# Patient Record
Sex: Male | Born: 1955
Health system: Southern US, Community
[De-identification: ages and names within clinical notes are randomized; demographics above are authoritative.]

## PROBLEM LIST (undated history)

## (undated) DIAGNOSIS — T07XXXA Unspecified multiple injuries, initial encounter: Secondary | ICD-10-CM

## (undated) DIAGNOSIS — F329 Major depressive disorder, single episode, unspecified: Secondary | ICD-10-CM

## (undated) DIAGNOSIS — F1010 Alcohol abuse, uncomplicated: Secondary | ICD-10-CM

## (undated) DIAGNOSIS — G8929 Other chronic pain: Secondary | ICD-10-CM

## (undated) DIAGNOSIS — F32A Depression, unspecified: Secondary | ICD-10-CM

## (undated) DIAGNOSIS — M1990 Unspecified osteoarthritis, unspecified site: Secondary | ICD-10-CM

## (undated) DIAGNOSIS — K219 Gastro-esophageal reflux disease without esophagitis: Secondary | ICD-10-CM

## (undated) DIAGNOSIS — H269 Unspecified cataract: Secondary | ICD-10-CM

## (undated) DIAGNOSIS — M549 Dorsalgia, unspecified: Secondary | ICD-10-CM

## (undated) HISTORY — PX: FINGER FRACTURE SURGERY: SHX638

## (undated) HISTORY — PX: ARTHROSCOPIC REPAIR ACL: SUR80

## (undated) HISTORY — PX: TONSILLECTOMY: SUR1361

## (undated) HISTORY — PX: ORIF ANKLE FRACTURE: SUR919

## (undated) HISTORY — PX: BACK SURGERY: SHX140

## (undated) HISTORY — PX: JOINT REPLACEMENT: SHX530

## (undated) HISTORY — PX: MANDIBLE FRACTURE SURGERY: SHX706

## (undated) HISTORY — DX: Unspecified multiple injuries, initial encounter: T07.XXXA

## (undated) HISTORY — PX: ORBITAL FRACTURE SURGERY: SHX725

## (undated) HISTORY — PX: FRACTURE SURGERY: SHX138

---

## 1997-12-05 ENCOUNTER — Inpatient Hospital Stay (HOSPITAL_COMMUNITY): Admission: EM | Admit: 1997-12-05 | Discharge: 1997-12-07 | Payer: Self-pay | Admitting: Emergency Medicine

## 1998-03-30 ENCOUNTER — Emergency Department (HOSPITAL_COMMUNITY): Admission: EM | Admit: 1998-03-30 | Discharge: 1998-03-30 | Payer: Self-pay | Admitting: Emergency Medicine

## 1998-09-27 ENCOUNTER — Encounter: Payer: Self-pay | Admitting: Emergency Medicine

## 1998-09-27 ENCOUNTER — Emergency Department (HOSPITAL_COMMUNITY): Admission: EM | Admit: 1998-09-27 | Discharge: 1998-09-27 | Payer: Self-pay | Admitting: Emergency Medicine

## 1999-01-08 ENCOUNTER — Emergency Department (HOSPITAL_COMMUNITY): Admission: EM | Admit: 1999-01-08 | Discharge: 1999-01-08 | Payer: Self-pay | Admitting: Emergency Medicine

## 1999-01-12 ENCOUNTER — Emergency Department (HOSPITAL_COMMUNITY): Admission: EM | Admit: 1999-01-12 | Discharge: 1999-01-12 | Payer: Self-pay | Admitting: Emergency Medicine

## 1999-01-18 ENCOUNTER — Emergency Department (HOSPITAL_COMMUNITY): Admission: EM | Admit: 1999-01-18 | Discharge: 1999-01-18 | Payer: Self-pay | Admitting: Emergency Medicine

## 1999-09-05 ENCOUNTER — Emergency Department (HOSPITAL_COMMUNITY): Admission: EM | Admit: 1999-09-05 | Discharge: 1999-09-05 | Payer: Self-pay | Admitting: Emergency Medicine

## 1999-09-05 ENCOUNTER — Encounter: Payer: Self-pay | Admitting: Emergency Medicine

## 2001-11-30 ENCOUNTER — Emergency Department (HOSPITAL_COMMUNITY): Admission: EM | Admit: 2001-11-30 | Discharge: 2001-11-30 | Payer: Self-pay | Admitting: Emergency Medicine

## 2002-12-02 ENCOUNTER — Other Ambulatory Visit (HOSPITAL_COMMUNITY): Admission: RE | Admit: 2002-12-02 | Discharge: 2002-12-12 | Payer: Self-pay | Admitting: Psychiatry

## 2003-06-10 ENCOUNTER — Emergency Department (HOSPITAL_COMMUNITY): Admission: EM | Admit: 2003-06-10 | Discharge: 2003-06-10 | Payer: Self-pay | Admitting: Emergency Medicine

## 2003-10-14 ENCOUNTER — Emergency Department (HOSPITAL_COMMUNITY): Admission: EM | Admit: 2003-10-14 | Discharge: 2003-10-14 | Payer: Self-pay | Admitting: Family Medicine

## 2007-05-15 ENCOUNTER — Emergency Department (HOSPITAL_COMMUNITY): Admission: EM | Admit: 2007-05-15 | Discharge: 2007-05-15 | Payer: Self-pay | Admitting: Emergency Medicine

## 2009-05-11 ENCOUNTER — Emergency Department (HOSPITAL_COMMUNITY): Admission: EM | Admit: 2009-05-11 | Discharge: 2009-05-11 | Payer: Self-pay | Admitting: Emergency Medicine

## 2011-04-11 LAB — HEPATIC FUNCTION PANEL
ALT: 13
AST: 22
Albumin: 4.1
Total Protein: 7.1

## 2011-04-11 LAB — POCT CARDIAC MARKERS
Myoglobin, poc: 73.8
Operator id: 288331

## 2011-04-11 LAB — CBC
MCV: 101 — ABNORMAL HIGH
Platelets: 191
WBC: 6

## 2011-04-11 LAB — URINALYSIS, ROUTINE W REFLEX MICROSCOPIC
Hgb urine dipstick: NEGATIVE
Protein, ur: NEGATIVE
Specific Gravity, Urine: 1.016
Urobilinogen, UA: 0.2
pH: 5.5

## 2011-04-11 LAB — I-STAT 8, (EC8 V) (CONVERTED LAB)
BUN: 15
Bicarbonate: 22.8
Glucose, Bld: 103 — ABNORMAL HIGH
Operator id: 288331
pCO2, Ven: 35.4 — ABNORMAL LOW
pH, Ven: 7.417 — ABNORMAL HIGH

## 2011-04-11 LAB — DIFFERENTIAL
Basophils Relative: 0
Eosinophils Absolute: 0 — ABNORMAL LOW
Lymphs Abs: 1
Neutrophils Relative %: 74

## 2011-04-11 LAB — POCT I-STAT CREATININE: Creatinine, Ser: 1.1

## 2011-12-19 ENCOUNTER — Encounter (HOSPITAL_COMMUNITY): Payer: Self-pay | Admitting: *Deleted

## 2011-12-19 ENCOUNTER — Emergency Department (HOSPITAL_COMMUNITY)
Admission: EM | Admit: 2011-12-19 | Discharge: 2011-12-19 | Disposition: A | Discharge status: Home / Self Care | Payer: Self-pay | Attending: Emergency Medicine | Admitting: Emergency Medicine

## 2011-12-19 DIAGNOSIS — R1110 Vomiting, unspecified: Secondary | ICD-10-CM

## 2011-12-19 DIAGNOSIS — R112 Nausea with vomiting, unspecified: Secondary | ICD-10-CM | POA: Insufficient documentation

## 2011-12-19 DIAGNOSIS — J3489 Other specified disorders of nose and nasal sinuses: Secondary | ICD-10-CM | POA: Insufficient documentation

## 2011-12-19 DIAGNOSIS — T148 Other injury of unspecified body region: Secondary | ICD-10-CM | POA: Insufficient documentation

## 2011-12-19 DIAGNOSIS — F17200 Nicotine dependence, unspecified, uncomplicated: Secondary | ICD-10-CM | POA: Insufficient documentation

## 2011-12-19 DIAGNOSIS — W57XXXA Bitten or stung by nonvenomous insect and other nonvenomous arthropods, initial encounter: Secondary | ICD-10-CM | POA: Insufficient documentation

## 2011-12-19 LAB — COMPREHENSIVE METABOLIC PANEL
Alkaline Phosphatase: 90 U/L (ref 39–117)
BUN: 13 mg/dL (ref 6–23)
CO2: 27 mEq/L (ref 19–32)
Calcium: 9.4 mg/dL (ref 8.4–10.5)
GFR calc Af Amer: 88 mL/min — ABNORMAL LOW (ref >90)
GFR calc non Af Amer: 76 mL/min — ABNORMAL LOW (ref >90)
Glucose, Bld: 94 mg/dL (ref 70–99)
Potassium: 4 mEq/L (ref 3.5–5.1)
Total Protein: 7.3 g/dL (ref 6.0–8.3)

## 2011-12-19 LAB — COMPREHENSIVE METABOLIC PANEL WITH GFR
ALT: 11 U/L (ref 0–53)
AST: 23 U/L (ref 0–37)
Albumin: 4 g/dL (ref 3.5–5.2)
Chloride: 101 meq/L (ref 96–112)
Creatinine, Ser: 1.06 mg/dL (ref 0.50–1.35)
Sodium: 139 meq/L (ref 135–145)
Total Bilirubin: 0.3 mg/dL (ref 0.3–1.2)

## 2011-12-19 LAB — CBC
HCT: 42.6 % (ref 39.0–52.0)
Hemoglobin: 14.9 g/dL (ref 13.0–17.0)
MCH: 33.1 pg (ref 26.0–34.0)
MCHC: 35 g/dL (ref 30.0–36.0)
MCV: 94.7 fL (ref 78.0–100.0)
Platelets: 163 K/uL (ref 150–400)
RBC: 4.5 MIL/uL (ref 4.22–5.81)
RDW: 12.7 % (ref 11.5–15.5)
WBC: 5.7 K/uL (ref 4.0–10.5)

## 2011-12-19 LAB — DIFFERENTIAL
Basophils Absolute: 0 K/uL (ref 0.0–0.1)
Basophils Relative: 1 % (ref 0–1)
Eosinophils Absolute: 0.4 10*3/uL (ref 0.0–0.7)
Eosinophils Relative: 7 % — ABNORMAL HIGH (ref 0–5)
Lymphocytes Relative: 29 % (ref 12–46)
Lymphs Abs: 1.7 10*3/uL (ref 0.7–4.0)
Monocytes Absolute: 0.9 10*3/uL (ref 0.1–1.0)
Monocytes Relative: 16 % — ABNORMAL HIGH (ref 3–12)
Neutro Abs: 2.8 K/uL (ref 1.7–7.7)
Neutrophils Relative %: 49 % (ref 43–77)

## 2011-12-19 LAB — LIPASE, BLOOD: Lipase: 41 U/L (ref 11–59)

## 2011-12-19 MED ORDER — ONDANSETRON HCL 4 MG/2ML IJ SOLN: 4.0000 mg | Freq: Once | INTRAMUSCULAR | Status: DC

## 2011-12-19 MED ORDER — DIPHENHYDRAMINE HCL 25 MG PO CAPS
50.0000 mg | ORAL_CAPSULE | Freq: Once | ORAL | Status: AC
  Administered 2011-12-19: 50 mg via ORAL
  Filled 2011-12-19: qty 2

## 2011-12-19 MED ORDER — SODIUM CHLORIDE 0.9 % IV BOLUS (SEPSIS): 500.0000 mL | Freq: Once | INTRAVENOUS | Status: DC

## 2011-12-19 MED ORDER — DIPHENHYDRAMINE HCL 25 MG PO CAPS: 25.0000 mg | ORAL_CAPSULE | Freq: Four times a day (QID) | ORAL | Status: DC | PRN

## 2011-12-19 MED ORDER — ACETAMINOPHEN 325 MG PO TABS
975.0000 mg | ORAL_TABLET | Freq: Once | ORAL | Status: AC
  Administered 2011-12-19: 975 mg via ORAL
  Filled 2011-12-19: qty 3

## 2011-12-19 MED ORDER — SODIUM CHLORIDE 0.9 % IV BOLUS (SEPSIS)
1000.0000 mL | Freq: Once | INTRAVENOUS | Status: AC
  Administered 2011-12-19: 1000 mL via INTRAVENOUS

## 2011-12-19 NOTE — Discharge Instructions (Signed)
Take Tylenol and Benadryl for your symptoms.  Follow up with a Primary Care Physician at [facility].    Follow up with your primary care doctor about your hospital visit. Continue to hydrate orally. Read the instructions below for reasons to return to the ER.   The 'BRAT' diet is suggested, then progress to diet as tolerated as symptoms abate. Call if bloody stools, persistent diarrhea, vomiting, fever or abdominal pain. Bananas.  Rice.  Applesauce.  Toast (and other simple starches such as crackers, potatoes, noodles).   SEEK IMMEDIATE MEDICAL ATTENTION IF:  You begin having localized abdominal pain that does not go away or becomes severe (The right side could  possibly be appendicitis. In an adult, the left lower portion of the abdomen could be colitis or diverticulitis)   A temperature above 101 develops  Repeated vomiting occurs (multiple uncontrollable episodes) or you are unable to keep fluids down  Blood is being passed in stools or vomit (bright red or black tarry stools).   Return also if you develop chest pain, difficulty breathing, dizziness or fainting, or become confused, poorly responsive, or inconsolable (young children).   RESOURCE GUIDE  Dental Problems  Patients with Medicaid: [facility] [phone]                                           [phone] [address] [employer] Phone:  [phone]                                                  Phone:  [phone]  If unable to pay or uninsured, contact:  [facility] or [facility]. to become qualified for the adult dental clinic.  Chronic Pain Problems Contact [facility]  [phone] Patients need to be referred by their primary care doctor.  Insufficient Money for Medicine Contact United Way:  call "211" or [facility] [phone].  No Primary Care Doctor Call [facility] ([facility]) [phone] Other agencies that provide inexpensive medical care    [facility]  [phone]    [facility]  [phone]    [facility]  [phone]    [facility]  [phone]    Planned Parenthood  [phone]    [facility]  [phone]  Psychological Services [facility]  [phone] [facility] Services  [phone] [facility]   [phone] (emergency services 3144035102)  Substance Abuse Resources [facility]  9012848186 [facility] 7276920221 [place] [phone] Floydene Flock [phone] Residential & Outpatient Substance Abuse Program  561-345-4070  Abuse/Neglect [facility] Child Abuse Hotline 4702843192 [facility] Child Abuse Hotline [phone] (After Hours)  Emergency Shelter [facility] [phone]  Maternity Homes [facility] [phone] [facility] 8024787016  MRSA Hotline #:   628-736-1078    [facility]  [facility] [address] [place]                       [address]      [address]  [place]                                                [place]                            [place] Phone:  [phone]                                   Phone:  [phone]                 Phone:  [phone]  [facility] Phone:  [phone]  [place] Child Abuse Hotline (336) 342-1394 [phone]) [phone] (After Hours)    

## 2011-12-19 NOTE — ED Notes (Signed)
Discharged via teach back 

## 2011-12-19 NOTE — ED Notes (Signed)
Heather PA informed of HR

## 2011-12-19 NOTE — ED Notes (Signed)
Pt tolerating food and fluids 

## 2011-12-19 NOTE — ED Provider Notes (Signed)
History     CSN: [id number]  Arrival date & time 12/19/11  1519   First MD Initiated Contact with Patient 12/19/11 1652      Chief Complaint  Patient presents with  . Emesis    (Consider location/radiation/quality/duration/timing/severity/associated sxs/prior treatment) HPI Reports has vomited 2-4 times per day onset 2 days ago. Also complains of itching on arms and back as result of blood bites. Also complains of mild diffuse headache onset 2 days ago. No bowel pain no chest pain no fever. No nausea at present he is presently hungry. Associated symptoms include mild generalized weakness, slight cough and nasal congestion. Nothing makes symptoms better or worse. No treatment prior to coming here. History reviewed. No pertinent past medical history. Past medical history negative History reviewed. No pertinent past surgical history.  No family history on file.  History  Substance Use Topics  . Smoking status: Current Everyday Smoker  . Smokeless tobacco: Not on file  . Alcohol Use: Yes     social history positive smoker drinks 1 sixpack of beer per week  Review of Systems  Gastrointestinal: Positive for nausea and vomiting.  Skin: Positive for rash.  Neurological: Positive for weakness and headaches.    Allergies  Review of patient's allergies indicates no known allergies.  Home Medications  No current outpatient prescriptions on file.  BP 100/50  Pulse 80  Temp 98.5 F (36.9 C)  Resp 15  SpO2 97%  Physical Exam  Nursing note and vitals reviewed. Constitutional: He appears well-developed and well-nourished. No distress.  HENT:  Head: Normocephalic and atraumatic.       Nasal congestion  Eyes: Conjunctivae are normal. Pupils are equal, round, and reactive to light.  Neck: Neck supple. No tracheal deviation present. No thyromegaly present.  Cardiovascular: Normal rate and regular rhythm.   No murmur heard. Pulmonary/Chest: Effort normal and breath sounds  normal.  Abdominal: Soft. Bowel sounds are normal. He exhibits no distension. There is no tenderness.  Musculoskeletal: Normal range of motion. He exhibits no edema and no tenderness.  Neurological: He is alert. He displays normal reflexes. Coordination normal.  Skin: Skin is warm and dry. Rash noted.       Multiple pinpoint pinkish papules on bilateral shoulders and upper arms a few on his chest consistent with bug bites  Psychiatric: He has a normal mood and affect.    ED Course  Procedures (including critical care time)   Labs Reviewed  CBC  DIFFERENTIAL  URINALYSIS, ROUTINE W REFLEX MICROSCOPIC  LIPASE, BLOOD  COMPREHENSIVE METABOLIC PANEL   No results found. Results for orders placed during the hospital encounter of 12/19/11  CBC      Component Value Range   WBC 5.7  4.0 - 10.5 K/uL   RBC 4.50  4.22 - 5.81 MIL/uL   Hemoglobin 14.9  13.0 - 17.0 g/dL   HCT 40.9  81.1 - 91.4 %   MCV 94.7  78.0 - 100.0 fL   MCH 33.1  26.0 - 34.0 pg   MCHC 35.0  30.0 - 36.0 g/dL   RDW 78.2  95.6 - 21.3 %   Platelets 163  150 - 400 K/uL  DIFFERENTIAL      Component Value Range   Neutrophils Relative 49  43 - 77 %   Neutro Abs 2.8  1.7 - 7.7 K/uL   Lymphocytes Relative 29  12 - 46 %   Lymphs Abs 1.7  0.7 - 4.0 K/uL   Monocytes Relative 16 (*)  3 - 12 %   Monocytes Absolute 0.9  0.1 - 1.0 K/uL   Eosinophils Relative 7 (*) 0 - 5 %   Eosinophils Absolute 0.4  0.0 - 0.7 K/uL   Basophils Relative 1  0 - 1 %   Basophils Absolute 0.0  0.0 - 0.1 K/uL  LIPASE, BLOOD      Component Value Range   Lipase 41  11 - 59 U/L  COMPREHENSIVE METABOLIC PANEL      Component Value Range   Sodium 139  135 - 145 mEq/L   Potassium 4.0  3.5 - 5.1 mEq/L   Chloride 101  96 - 112 mEq/L   CO2 27  19 - 32 mEq/L   Glucose, Bld 94  70 - 99 mg/dL   BUN 13  6 - 23 mg/dL   Creatinine, Ser 0.45  0.50 - 1.35 mg/dL   Calcium 9.4  8.4 - 40.9 mg/dL   Total Protein 7.3  6.0 - 8.3 g/dL   Albumin 4.0  3.5 - 5.2 g/dL    AST 23  0 - 37 U/L   ALT 11  0 - 53 U/L   Alkaline Phosphatase 90  39 - 117 U/L   Total Bilirubin 0.3  0.3 - 1.2 mg/dL   GFR calc non Af Amer 76 (*) >90 mL/min   GFR calc Af Amer 88 (*) >90 mL/min   No results found.   No diagnosis found.    MDM  No signs of meningitis suspect viral illness. Patient is not ill-appearing Rash is consistent with bug bites. Plan send to CDU for hydration, and to complete level evaluation        Doug Sou, MD 12/20/11 8119

## 2011-12-19 NOTE — ED Notes (Signed)
Pt reports n/v since Sunday. Not since this morning. Denying any at this time. C/o "pounding" h/a. Pt a x 4. Sounds congested.

## 2011-12-19 NOTE — ED Provider Notes (Signed)
Assumed care of patient in the CDU from Dr. Ethelda Chick. Patient came in to the ED with a chief complaint of vomiting and itching all over.  Plan is for patient to get a meal and 1 Liter IVF.  Patient has received Benadryl and Tylenol for his symptoms.  CMP pending.  6:34 PM Reassessed patient.  Patient reports that his symptoms have improved.  Patient is currently eating dinner and has been able to keep food down.  Labs unremarkable. Will discharge patient home.  Patient in agreement with the plan.  Pascal Lux [place], PA-C 12/19/11 [phone]

## 2011-12-19 NOTE — ED Notes (Signed)
The pt has mutlitple complaints .  He has a cold cough with vomiting since Sunday and he has a rash on his back he thinks he has bugs but cannot see them,  He lives in a rooming house

## 2011-12-20 NOTE — ED Provider Notes (Signed)
Medical screening examination/treatment/procedure(s) were conducted as a shared visit with non-physician practitioner(s) and myself.  I personally evaluated the patient during the encounter  Doug Sou, MD 12/20/11 [id number]

## 2011-12-26 ENCOUNTER — Emergency Department (HOSPITAL_COMMUNITY)
Admission: EM | Admit: 2011-12-26 | Discharge: 2011-12-26 | Disposition: A | Discharge status: Home / Self Care | Payer: Self-pay | Attending: Emergency Medicine | Admitting: Emergency Medicine

## 2011-12-26 ENCOUNTER — Emergency Department (HOSPITAL_COMMUNITY): Payer: Self-pay

## 2011-12-26 DIAGNOSIS — R059 Cough, unspecified: Secondary | ICD-10-CM | POA: Insufficient documentation

## 2011-12-26 DIAGNOSIS — R112 Nausea with vomiting, unspecified: Secondary | ICD-10-CM | POA: Insufficient documentation

## 2011-12-26 DIAGNOSIS — R0981 Nasal congestion: Secondary | ICD-10-CM

## 2011-12-26 DIAGNOSIS — R05 Cough: Secondary | ICD-10-CM | POA: Insufficient documentation

## 2011-12-26 DIAGNOSIS — J3489 Other specified disorders of nose and nasal sinuses: Secondary | ICD-10-CM | POA: Insufficient documentation

## 2011-12-26 DIAGNOSIS — F17200 Nicotine dependence, unspecified, uncomplicated: Secondary | ICD-10-CM | POA: Insufficient documentation

## 2011-12-26 LAB — COMPREHENSIVE METABOLIC PANEL
ALT: 9 U/L (ref 0–53)
AST: 17 U/L (ref 0–37)
Albumin: 4.2 g/dL (ref 3.5–5.2)
CO2: 28 mEq/L (ref 19–32)
Calcium: 9.5 mg/dL (ref 8.4–10.5)
GFR calc non Af Amer: 73 mL/min — ABNORMAL LOW (ref >90)
Sodium: 139 mEq/L (ref 135–145)

## 2011-12-26 LAB — DIFFERENTIAL
Basophils Relative: 0 % (ref 0–1)
Eosinophils Absolute: 0.4 10*3/uL (ref 0.0–0.7)
Eosinophils Relative: 5 % (ref 0–5)
Lymphs Abs: 2.3 10*3/uL (ref 0.7–4.0)
Monocytes Absolute: 0.7 10*3/uL (ref 0.1–1.0)
Monocytes Relative: 10 % (ref 3–12)

## 2011-12-26 LAB — CBC
HCT: 42.2 % (ref 39.0–52.0)
Hemoglobin: 14.9 g/dL (ref 13.0–17.0)
MCH: 33.1 pg (ref 26.0–34.0)
MCHC: 35.3 g/dL (ref 30.0–36.0)
MCV: 93.8 fL (ref 78.0–100.0)
RBC: 4.5 MIL/uL (ref 4.22–5.81)

## 2011-12-26 MED ORDER — SODIUM CHLORIDE 0.9 % IV BOLUS (SEPSIS)
1000.0000 mL | Freq: Once | INTRAVENOUS | Status: AC
  Administered 2011-12-26: 1000 mL via INTRAVENOUS

## 2011-12-26 MED ORDER — ONDANSETRON HCL 4 MG PO TABS: 4.0000 mg | ORAL_TABLET | Freq: Four times a day (QID) | ORAL | Status: AC

## 2011-12-26 MED ORDER — ONDANSETRON HCL 4 MG/2ML IJ SOLN
4.0000 mg | Freq: Once | INTRAMUSCULAR | Status: AC
  Administered 2011-12-26: 4 mg via INTRAVENOUS
  Filled 2011-12-26: qty 2

## 2011-12-26 NOTE — Discharge Instructions (Signed)
You need to followup with a doctor. There is a list of doctors at the bottom of the sheet. Follow up with your providers as dicussed in the ED today and as written above.  See your doctor immediately--or return to the ED--with any new or troubling symptoms including fevers, weakness, new chest pain, shortness or breath, numbness, or any other concerning symptom.  B.R.A.T. Diet    Your doctor has recommended the B.R.A.T. diet for you or your child until the condition improves. This is often used to help control diarrhea and vomiting symptoms. If you or your child can tolerate clear liquids, you may have:  Bananas.   Rice.   Applesauce.   Toast (and other simple starches such as crackers, potatoes, noodles).  Be sure to avoid dairy products, meats, and fatty foods until symptoms are better. Fruit juices such as apple, grape, and prune juice can make diarrhea worse. Avoid these. Continue this diet for 2 days or as instructed by your caregiver. Document Released: 06/19/2005 Document Revised: 06/08/2011 Document Reviewed: 12/06/2006 [facility] Patient Information 2012 [place], [place].  RESOURCE GUIDE  Chronic Pain Problems: Contact [facility]  [phone] Patients need to be referred by their primary care doctor.  Insufficient Money for Medicine: Contact United Way:  call "211" or [facility] [phone].  No Primary Care Doctor: - Call [facility]  [phone] - can help you locate a primary care doctor that  accepts your insurance, provides certain services, etc. - Physician Referral [phone]  Agencies that provide inexpensive medical care: - [facility]  [phone] - [facility]  [phone] - [facility]  [phone] [facility]  [phone] - Planned Parenthood  [phone] [facility]  [phone]  Medicaid-accepting [facility] Providers: - [facility]- [address]  [address]  [phone], Mon-Fri 9am-7pm, Sat 9am-1pm - [facility]- [address] [place], [address] [place]  [phone] - [facility]- [address] [place]  [phone] [facility]- [address]  [phone] - Renaye Rakers- [address] [place], [address], [phone]  Only accepts [place] patients after they have their name  applied to their card  Self Pay (no insurance) in [place]: - Sickle Cell Patients: Dr Willey Blade, [facility]  [address] [place], [phone] - [facility]- [address] [place]  [phone]       [facility] [place]- [address]       -     [facility]- see information above (Speak to [employer] if you do not have insurance)       -  [facility]- [address] [place], [phone]       -  [facility]- [address],  [phone]       -  [facility]- [address], [phone]       -  Dr Julio Sicks-  [address], [address], [place], [phone]       -  [facility]- [address], [phone]       -  [facility]- [address], [phone], also [address] [phone]       -    [facility]- [address]  Circle, [phone], 1st & 3rd Saturday   every month, 10am-1pm  1) Find a Doctor and Pay Out of Pocket Although you won't have to find out who is covered by your insurance plan, it is a good idea to ask around and get recommendations. You will then need to call the office and see if the doctor you have chosen will accept you as a new patient and what types of options they offer for patients who are self-pay. Some doctors offer discounts or will set up payment plans for their patients who do not have insurance, but you will need to ask so you aren't surprised when you get to your appointment.  2) Contact Your Local Health Department Not all health  departments have doctors that can see patients for sick visits, but many do, so it is worth a call to see if yours does. If you don't know where your local health department is, you can check in your phone book. The CDC also has a tool to help you locate your state's health department, and many state websites also have listings of all of their local health departments.  3) Find a Walk-in Clinic If your illness is not likely to be very severe or complicated, you may want to try a walk in clinic. These are popping up all over the country in pharmacies, drugstores, and shopping centers. They're usually staffed by nurse practitioners or physician assistants that have been trained to treat common illnesses and complaints. They're usually fairly quick and inexpensive. However, if you have serious medical issues or chronic medical problems, these are probably not your best option  STD Testing - [facility] Department of [facility], STD Clinic, [address] [place], phone [phone] or [phone].  Monday - Friday, call for an appointment. [facility] Department of [employer], STD Clinic, [place] [address], [place], phone [phone] or [phone].  Monday - Friday, call for an appointment.  Abuse/Neglect: [facility] Child Abuse Hotline 941-750-0765 [facility] Child Abuse Hotline [phone] (After Hours)  Emergency Shelter:  [facility] 4636708652  Maternity Homes: - [facility] [place] [facility] [phone] - [facility] 5675833227  MRSA Hotline #:   930-290-9261  [facility] Resources  [facility] [place]  United [employer] [facility] [address]                 [address]         [address] [place] [address]  Blondell Reveal Phone:  [phone]                                   Phone:  [phone]                   Phone:  [phone]  [facility] Mental Health, [phone] - [facility] [employer]410 [phone]       -  [facility] in [place], [address],                                  [phone], [facility] Child Abuse Hotline 785-671-4945 or (660)426-1495 (After Hours)   Behavioral Health Services  Substance Abuse Resources: - Alcohol and Drug Services  743-426-4715 - [facility] 5487524514 - [facility] [place] [phone] Floydene Flock [phone] - Residential & Outpatient Substance Abuse Program  [phone]  Psychological Services: [facility]  [phone] [facility]  [phone] - [facility], [phone] [place]. [address] [place], ACCESS LINE: 434-409-9162 or 806-710-5308, [email or web address]  Dental Assistance  If unable to pay or uninsured, contact:  [facility] or [facility]. to become qualified for the adult dental clinic.  Patients with Medicaid: [facility] [phone] [address], [phone] [address] [phone]  If unable to pay, or uninsured, contact [facility] [phone]) or [facility] Department [phone] in [place], [phone] in [place]) to become qualified for the adult dental clinic  Other Low-Cost Community Dental Services: - [facility]- [address] [place], [place], [place], [address], [phone], [phone], 2nd and 4th Thursday of the month at 6:30am.  10 clients each day by appointment, can sometimes see walk-in patients if someone does not show for an appointment. [facility]- [address] [place]ins [place], [place], [address], [phone] - [facility]- [address] [place], [place], [address], [phone] - [facility]- [phone] [facility] Department- [phone] [facility] Department- [phone]

## 2011-12-26 NOTE — ED Notes (Signed)
The pt has had a cold for one week.  Vomiting for one week  Headache nasal congestion

## 2011-12-26 NOTE — ED Notes (Addendum)
Pt states he has been having sinus problems for about a week. Pt reported having sputum that is yellow prior to today, pt denies having sputum today. Pt denies taking any medications today. Pt states he has had a headache since yesterday.pt states he had nausea and vomiting starting Sunday. Pt states he only vomited one time this morning after he drunk the orange juice

## 2011-12-26 NOTE — ED Notes (Signed)
Pt. Stated, i was here last week with my head is clogged up and my stomach hurt.  i was here last week and given a RX but I can't afford it so I'm no better.

## 2011-12-26 NOTE — ED Notes (Signed)
Pt requesting food. Per MD ok to feed patient. Patient given sandwich, crackers, sprite and water to try.

## 2011-12-26 NOTE — ED Provider Notes (Signed)
History   This chart was scribed for Nelia Shi, MD by Sofie Rower. The patient was seen in room TR09C/TR09C and the patient's care was started at 4:34 PM     CSN: [id number]  Arrival date & time 12/26/11  1220   First MD Initiated Contact with Patient 12/26/11 1552        Chief Complaint  Patient presents with  . multiple complaints     (Consider location/radiation/quality/duration/timing/severity/associated sxs/prior treatment) Patient is a 56 y.o. male presenting with vomiting. The history is provided by the patient. No language interpreter was used.  Emesis  This is a new problem. The current episode started more than 2 days ago. The problem occurs 2 to 4 times per day. The problem has not changed since onset.The emesis has an appearance of stomach contents. There has been no fever. Associated symptoms include cough and headaches.    Gary Davis is a 56 y.o. male who presents to the Emergency Department complaining of moderate, episodic vomiting onset two days ago with associated symptoms of cough, sinus congestion, headache (onset two days ago), light headedness. The pt informs the EDP that he has not been able to keep anything down, he tried to drink some orange juice this morning and it came right back up. Pt has not been able to eat anything yet today. Pt has a hx of visiting [facility] for similar symptoms where which he was Rx benadryl.   Pt denies fever. The pt informs the EDP that he cannot afford his prescription, he does not get paid until this Friday, 12/29/11. Pt is a smoker. Pt drinks alcohol (beer, 2 cans per day).    History of present illness from Dr. Elinor Dodge starts here.  I agree with the history of present illness outlined above.    History  Substance Use Topics  . Smoking status: Current Everyday Smoker  . Smokeless tobacco: Not on file  . Alcohol Use: Yes      Review of Systems  Respiratory: Positive for cough.   Gastrointestinal: Positive for  vomiting.  Neurological: Positive for headaches.  All other systems reviewed and are negative.    Allergies  Review of patient's allergies indicates no known allergies.  Home Medications   Current Outpatient Rx  Name Route Sig Dispense Refill  . ONDANSETRON HCL 4 MG PO TABS Oral Take 1 tablet (4 mg total) by mouth every 6 (six) hours. 12 tablet 0    BP 93/66  Pulse 91  Temp 97.7 F (36.5 C) (Oral)  Resp 17  SpO2 96%  Physical Exam  Nursing note and vitals reviewed. Constitutional: He is oriented to person, place, and time. He appears well-developed and well-nourished. No distress.  HENT:  Head: Normocephalic and atraumatic.  Eyes: Pupils are equal, round, and reactive to light.  Neck: Normal range of motion.  Cardiovascular: Normal rate and intact distal pulses.   Pulmonary/Chest: No respiratory distress. He has rales (Scattered. ).  Abdominal: Normal appearance. He exhibits no distension.  Musculoskeletal: Normal range of motion.  Neurological: He is alert and oriented to person, place, and time. No cranial nerve deficit.  Skin: Skin is warm and dry. No rash noted.  Psychiatric: He has a normal mood and affect. His behavior is normal.   Dr Rainey Pines Physical exam:  Consitutional: Pt in no acute distress.   Head: Normocephalic and atraumatic.  Eyes: Extraocular motion intact, no scleral icterus Neck: Supple without meningismus, mass, or overt JVD Respiratory: Effort normal and breath  sounds normal. No respiratory distress. CV: Heart variable rate.  Regular rhythm (sinus), no obvious murmurs.  Pulses +2 and symmetric Abdomen: Soft, non-tender, non-distended. No rebound or guarding.  MSK: Extremities are atraumatic without deformity, ROM intact Skin: Warm, dry, intact Neuro: Alert and oriented, no motor deficit noted.   Psychiatric: Mood and affect are normal  EKG:  Rate: 55 Rythym Sinus  Interval not calcualted 2/2 artifact.  But ~ 1 box.  Axis: normal/borderline  LAD No gross conduction abnormalities appreciated.  No gross ST or T-wave abnormalities appreciated.     ED Course  Procedures (including critical care time)  DIAGNOSTIC STUDIES: Oxygen Saturation is 96% on room air, normal by my interpretation.    COORDINATION OF CARE:  4:39PM- EDP at bedside discusses treatment plan concerning IV fluids and management of dehydration.    Labs Reviewed  COMPREHENSIVE METABOLIC PANEL - Abnormal; Notable for the following:    GFR calc non Af Amer 73 (*)     GFR calc Af Amer 85 (*)     All other components within normal limits  CBC  DIFFERENTIAL  POCT I-STAT TROPONIN I  GLUCOSE, CAPILLARY   Dg Chest 2 View  12/26/2011  *RADIOLOGY REPORT*  Clinical Data: Productive cough and chest congestion.  CHEST - 2 VIEW  Comparison: None.  Findings: Heart size and pulmonary vascularity are normal and the lungs are clear except slight peribronchial thickening visible on the lateral view.  No osseous abnormality.  IMPRESSION: Slight bronchitic changes.  Otherwise, normal exam.  Original Report Authenticated By: Gwynn Burly, M.D.     1. Head congestion   2. Nausea & vomiting       MDM   Patient presents with unclear etiology for nasal congestion, headache and vomiting. The patient does not appear to be toxic or infected. This is not likely the case of sepsis with vomiting. My initial thought was pancreatitis, however the patient has no history or exam to suggest this diagnosis. I do want to add on to the initial laboratory work by Dr. Radford Pax cardiac studies given the patient's vomiting, headaches and discomfort, and variable heart rate on monitor in the room.  Normal cardiac studies. The patient feels better after Zofran. The patient requested food and drink. The patient has been taking by mouth medication well. Patient has been producing urine.  Patient does need followup for this ongoing intermittent nausea and vomiting. However the patient is stable  for discharge home.  The patient is to followup with her primary care provider with possible followup by GI if the symptoms continue. However the patient is stable for discharge at this time.  PT DC home stable.  Discussed with pt the clinical impression, treatment in the ED, and follow up plan.  We alslo discussed the indications for returning to the ED, which include shortness or breath, confusion, fever, new weakness or numbness, chest pain, or any other concerning symptom.  The pt understood the treatment and plan, is stable, and is able to leave the ED.         Larrie Kass, MD 12/27/11 [phone]

## 2011-12-27 NOTE — ED Provider Notes (Signed)
I have seen and examined this patient with the resident.  I agree with the resident's note, assessment and plan except as indicated.     Nat Christen, MD 12/27/11 [phone]

## 2012-11-02 ENCOUNTER — Emergency Department (HOSPITAL_COMMUNITY)
Admission: EM | Admit: 2012-11-02 | Discharge: 2012-11-02 | Disposition: A | Discharge status: Home / Self Care | Payer: Self-pay | Attending: Emergency Medicine | Admitting: Emergency Medicine

## 2012-11-02 ENCOUNTER — Encounter (HOSPITAL_COMMUNITY): Payer: Self-pay | Admitting: Physical Medicine and Rehabilitation

## 2012-11-02 DIAGNOSIS — M25569 Pain in unspecified knee: Secondary | ICD-10-CM | POA: Insufficient documentation

## 2012-11-02 DIAGNOSIS — G8929 Other chronic pain: Secondary | ICD-10-CM | POA: Insufficient documentation

## 2012-11-02 DIAGNOSIS — M25562 Pain in left knee: Secondary | ICD-10-CM

## 2012-11-02 DIAGNOSIS — F17200 Nicotine dependence, unspecified, uncomplicated: Secondary | ICD-10-CM | POA: Insufficient documentation

## 2012-11-02 MED ORDER — OXYCODONE-ACETAMINOPHEN 5-325 MG PO TABS
1.0000 | ORAL_TABLET | Freq: Once | ORAL | Status: AC
  Administered 2012-11-02: 1 via ORAL
  Filled 2012-11-02: qty 1

## 2012-11-02 MED ORDER — HYDROCODONE-ACETAMINOPHEN 5-325 MG PO TABS
ORAL_TABLET | ORAL | Status: DC
Start: 2012-11-02 — End: 2012-11-23

## 2012-11-02 NOTE — ED Provider Notes (Signed)
History     CSN: [id number]  Arrival date & time 11/02/12  1253   First MD Initiated Contact with Patient 11/02/12 1300      Chief Complaint  Patient presents with  . Knee Pain    (Consider location/radiation/quality/duration/timing/severity/associated sxs/prior treatment) HPI  Gary Davis is a 57 y.o. male complaining of bilateral knee pain worsening over the course of one month. Denies trauma, patient states the pain is severe, 10 out of 10 all the time however he states it is exacerbated by weightbearing. Patient states that this sometimes the area swelled in [place] warm. Patient has history of anterior cruciate ligament repair approximately 2 years ago to the left knee. Denies numbness and paresthesia.   No past medical history on file.  No past surgical history on file.  History reviewed. No pertinent family history.  History  Substance Use Topics  . Smoking status: Current Every Day Smoker    Types: Cigarettes  . Smokeless tobacco: Not on file  . Alcohol Use: Yes      Review of Systems  Constitutional: Negative for fever.  Respiratory: Negative for shortness of breath.   Cardiovascular: Negative for chest pain.  Gastrointestinal: Negative for nausea, vomiting, abdominal pain and diarrhea.  Musculoskeletal: Positive for arthralgias.  All other systems reviewed and are negative.    Allergies  Review of patient's allergies indicates no known allergies.  Home Medications  No current outpatient prescriptions on file.  BP 121/78  Pulse 62  Temp(Src) 97.7 F (36.5 C) (Oral)  Resp 20  SpO2 98%  Physical Exam  Nursing note and vitals reviewed. Constitutional: He is oriented to person, place, and time. He appears well-developed and well-nourished. No distress.  HENT:  Head: Normocephalic.  Eyes: Conjunctivae and EOM are normal.  Cardiovascular: Normal rate.   Pulmonary/Chest: Effort normal. No stridor.  Musculoskeletal: Normal range of motion.   Bilateral knees:  No effusion, warmth, erythema, tenderness to palpation, full active range of motion with minimal pain. Anterior and posterior drawer are stable, stable to valgus and varus stress. There is moderate crepitance bilaterally. Patient ambulates with a coordinated and nonantalgic gait  Neurological: He is alert and oriented to person, place, and time.  Psychiatric: He has a normal mood and affect.    ED Course  Procedures (including critical care time)  Labs Reviewed - No data to display No results found.   1. Bilateral knee pain       MDM   Gary Davis is a 57 y.o. male with chronic knee pain. No indication for imaging at this time. Doubt septic joint. We have discussed osteoarthritis.    Filed Vitals:   11/02/12 1258  BP: 121/78  Pulse: 62  Temp: 97.7 F (36.5 C)  TempSrc: Oral  Resp: 20  SpO2: 98%     Pt verbalized understanding and agrees with care plan. Outpatient follow-up and return precautions given.    New Prescriptions   HYDROCODONE-ACETAMINOPHEN (NORCO/VICODIN) 5-325 MG PER TABLET    Take 1-2 tablets by mouth every 6 hours as needed for pain.           Wynetta Emery, PA-C 11/02/12 1421

## 2012-11-02 NOTE — ED Notes (Signed)
Pt presents to department for evaluation of bilateral knee pain. Ongoing x1 month. 10/10 pain at the time. Pt is alert and oriented x4. Ambulatory to triage.

## 2012-11-23 ENCOUNTER — Emergency Department (HOSPITAL_COMMUNITY): Payer: Self-pay

## 2012-11-23 ENCOUNTER — Emergency Department (HOSPITAL_COMMUNITY)
Admission: EM | Admit: 2012-11-23 | Discharge: 2012-11-23 | Disposition: A | Discharge status: Home / Self Care | Payer: Self-pay | Attending: Emergency Medicine | Admitting: Emergency Medicine

## 2012-11-23 ENCOUNTER — Encounter (HOSPITAL_COMMUNITY): Payer: Self-pay | Admitting: Emergency Medicine

## 2012-11-23 DIAGNOSIS — M25569 Pain in unspecified knee: Secondary | ICD-10-CM | POA: Insufficient documentation

## 2012-11-23 DIAGNOSIS — M25561 Pain in right knee: Secondary | ICD-10-CM

## 2012-11-23 DIAGNOSIS — F17200 Nicotine dependence, unspecified, uncomplicated: Secondary | ICD-10-CM | POA: Insufficient documentation

## 2012-11-23 MED ORDER — HYDROCODONE-ACETAMINOPHEN 5-325 MG PO TABS: 1.0000 | ORAL_TABLET | ORAL | Status: DC | PRN

## 2012-11-23 MED ORDER — HYDROCODONE-ACETAMINOPHEN 5-325 MG PO TABS
2.0000 | ORAL_TABLET | Freq: Once | ORAL | Status: AC
  Administered 2012-11-23: 2 via ORAL
  Filled 2012-11-23: qty 2

## 2012-11-23 NOTE — ED Provider Notes (Signed)
History     CSN: [id number]  Arrival date & time 11/23/12  1401   First MD Initiated Contact with Patient 11/23/12 1534      Chief Complaint  Patient presents with  . Knee Pain    (Consider location/radiation/quality/duration/timing/severity/associated sxs/prior treatment) HPI Comments: Patient is a 57 year old male who presents with a couple month history of bilateral knee pain. Symptoms started gradually and progressively worsened since the onset. Patient denies any injury. The pain is aching and severe without radiation. Weight bearing activity makes the pain worse. Patient was seen 3 weeks ago in the ED and prescribed Vicodin which helped with the pain. No associated symptoms.    History reviewed. No pertinent past medical history.  History reviewed. No pertinent past surgical history.  History reviewed. No pertinent family history.  History  Substance Use Topics  . Smoking status: Current Every Day Smoker    Types: Cigarettes  . Smokeless tobacco: Not on file  . Alcohol Use: Yes      Review of Systems  Musculoskeletal: Positive for arthralgias.  All other systems reviewed and are negative.    Allergies  Review of patient's allergies indicates no known allergies.  Home Medications  No current outpatient prescriptions on file.  BP 102/65  Pulse 76  Temp(Src) 98.1 F (36.7 C) (Oral)  Resp 16  SpO2 97%  Physical Exam  Nursing note and vitals reviewed. Constitutional: He is oriented to person, place, and time. He appears well-developed and well-nourished. No distress.  HENT:  Head: Normocephalic and atraumatic.  Eyes: Conjunctivae are normal.  Neck: Normal range of motion.  Cardiovascular: Normal rate, regular rhythm and intact distal pulses.  Exam reveals no gallop and no friction rub.   No murmur heard. Pulmonary/Chest: Effort normal and breath sounds normal. He has no wheezes. He has no rales. He exhibits no tenderness.  Abdominal: Soft. There is no  tenderness.  Musculoskeletal: Normal range of motion.  Bilateral knee tenderness to palpation. No obvious deformity. Mild generalized edema.   Neurological: He is alert and oriented to person, place, and time. Coordination normal.  Speech is goal-oriented. Moves limbs without ataxia.   Skin: Skin is warm and dry.  Psychiatric: He has a normal mood and affect. His behavior is normal.    ED Course  Procedures (including critical care time)  Labs Reviewed - No data to display Dg Knee Complete 4 Views Left  11/23/2012   *RADIOLOGY REPORT*  Clinical Data: Bilateral knee pain.  No known injury.  LEFT KNEE - COMPLETE 4+ VIEW  Comparison: None.  Findings: Moderate joint effusion is present.  There is no evidence for acute fracture or subluxation.  No radiopaque foreign body or soft tissue gas.  IMPRESSION:  1.  No fracture. 2.  Moderate joint effusion, raising the question of internal derangement.  Consider MRI of the knee without contrast if pain persists.   Original Report Authenticated By: Norva Pavlov, M.D.   Dg Knee Complete 4 Views Right  11/23/2012   *RADIOLOGY REPORT*  Clinical Data: Bilateral knee pain.  History of ACL surgery.  No known injury.  RIGHT KNEE - COMPLETE 4+ VIEW  Comparison: None  Findings: Previous ligamentous repair.  There are mild degenerative changes present.  No acute fracture or subluxation.  No joint effusion.  IMPRESSION:  1.  Postoperative changes. 2. No evidence for acute  abnormality.   Original Report Authenticated By: Norva Pavlov, M.D.     1. Bilateral knee pain  MDM  4:50 PM Knee xrays are unremarkable for acute changes. Recommended follow up MRI for left knee. Patient will be discharged with pain medication and recommended follow up with Orthopedist. No neurovascular compromise. Patient instructed to return with worsening or concerning symptoms.        Emilia Beck, PA-C 11/23/12 1656   Medical screening  examination/treatment/procedure(s) were performed by non-physician practitioner and as supervising physician I was immediately available for consultation/collaboration.   Derwood Kaplan, MD 11/24/12 0030

## 2012-11-23 NOTE — ED Notes (Signed)
Patient presents to ED today with bilateral knee pain and swelling in left knee. Patient states he was here a few weeks ago for the same but pain is worse now. NAD.

## 2012-12-26 ENCOUNTER — Emergency Department (HOSPITAL_COMMUNITY): Payer: Self-pay

## 2012-12-26 ENCOUNTER — Emergency Department (HOSPITAL_COMMUNITY)
Admission: EM | Admit: 2012-12-26 | Discharge: 2012-12-26 | Disposition: A | Discharge status: Home / Self Care | Payer: Self-pay | Attending: Emergency Medicine | Admitting: Emergency Medicine

## 2012-12-26 ENCOUNTER — Encounter (HOSPITAL_COMMUNITY): Payer: Self-pay | Admitting: *Deleted

## 2012-12-26 DIAGNOSIS — M25562 Pain in left knee: Secondary | ICD-10-CM

## 2012-12-26 DIAGNOSIS — R296 Repeated falls: Secondary | ICD-10-CM | POA: Insufficient documentation

## 2012-12-26 DIAGNOSIS — G8929 Other chronic pain: Secondary | ICD-10-CM | POA: Insufficient documentation

## 2012-12-26 DIAGNOSIS — F17200 Nicotine dependence, unspecified, uncomplicated: Secondary | ICD-10-CM | POA: Insufficient documentation

## 2012-12-26 DIAGNOSIS — Y92009 Unspecified place in unspecified non-institutional (private) residence as the place of occurrence of the external cause: Secondary | ICD-10-CM | POA: Insufficient documentation

## 2012-12-26 DIAGNOSIS — Z885 Allergy status to narcotic agent status: Secondary | ICD-10-CM | POA: Insufficient documentation

## 2012-12-26 DIAGNOSIS — M25569 Pain in unspecified knee: Secondary | ICD-10-CM | POA: Insufficient documentation

## 2012-12-26 DIAGNOSIS — M25469 Effusion, unspecified knee: Secondary | ICD-10-CM | POA: Insufficient documentation

## 2012-12-26 DIAGNOSIS — Y998 Other external cause status: Secondary | ICD-10-CM | POA: Insufficient documentation

## 2012-12-26 MED ORDER — TRAMADOL HCL 50 MG PO TABS: 50.0000 mg | ORAL_TABLET | Freq: Four times a day (QID) | ORAL | Status: DC | PRN

## 2012-12-26 NOTE — ED Provider Notes (Signed)
   History    CSN: [id number] Arrival date & time 12/26/12  1152  First MD Initiated Contact with Patient 12/26/12 1234     Chief Complaint  Patient presents with  . Knee Pain   (Consider location/radiation/quality/duration/timing/severity/associated sxs/prior Treatment) The history is provided by the patient.   Patient presents to the ED for left knee pain. Patient states he has dealt with bilateral knee pain for quite some time, but today the left is bothering him more than the right. Patient states he got out of bed last night to go to the bathroom in his left knee "gave out" on him. He fell to the ground but denies head trauma or LOC. He was recently seen for the same, and refered to orthopedics. He has not followed up yet because his insurance is not active yet. He denies any numbness or paresthesias of LE.  Has taken OTC pain meds without significant relief.  Has taken vicodin in the past which caused GI upset.  History reviewed. No pertinent past medical history. History reviewed. No pertinent past surgical history. History reviewed. No pertinent family history. History  Substance Use Topics  . Smoking status: Current Every Day Smoker -- 0.50 packs/day    Types: Cigarettes  . Smokeless tobacco: Not on file  . Alcohol Use: Yes    Review of Systems  Musculoskeletal: Positive for arthralgias.  All other systems reviewed and are negative.    Allergies  Hydrocodone  Home Medications  No current outpatient prescriptions on file. BP 99/70  Pulse 73  Temp(Src) 98.5 F (36.9 C) (Oral)  Resp 18  SpO2 98%  Physical Exam  Nursing note and vitals reviewed. Constitutional: He is oriented to person, place, and time. He appears well-developed and well-nourished. No distress.  HENT:  Head: Normocephalic and atraumatic.  Eyes: Conjunctivae and EOM are normal.  Neck: Normal range of motion.  Cardiovascular: Normal rate, regular rhythm and normal heart sounds.    Pulmonary/Chest: Effort normal and breath sounds normal.  Musculoskeletal: Normal range of motion. He exhibits no edema.       Left knee: He exhibits swelling. He exhibits normal range of motion, no effusion, no ecchymosis, no deformity, no laceration and no erythema. Tenderness found. Medial joint line tenderness noted.  Left knee with TTP of medial joint line and mild swelling, no effusion, strong distal pulse, sensation intact, gait favoring left knee  Neurological: He is alert and oriented to person, place, and time.  Skin: Skin is warm and dry.  Psychiatric: He has a normal mood and affect.    ED Course  Procedures (including critical care time) Labs Reviewed - No data to display Dg Knee Complete 4 Views Left  12/26/2012   *RADIOLOGY REPORT*  Clinical Data: Multiple falls.  Pain.  LEFT KNEE - COMPLETE 4+ VIEW  Comparison: 11/23/2012.  Findings: No fracture or dislocation.  Joint effusion.  Internal derangement not excluded. Mild patellofemoral joint degenerative changes.  IMPRESSION: No fracture or dislocation.  Joint effusion.  Internal derangement not excluded.   Original Report Authenticated By: Lacy Duverney, M.D.   1. Knee pain, chronic, left     MDM   X-ray negative for acute fracture dislocation. Patient with bilateral knee pain for several months. Knee sleeves placed in the ED at pts request.  Followup with orthopedics, Dr. Charlann Boxer once insurance available.  Rx tramadol for pain.  Discussed plan with patient, he agreed.  Return precautions advised.  Garlon Hatchet, PA-C 12/26/12 1628

## 2012-12-26 NOTE — ED Notes (Signed)
Pt c/o left knee pain x 1 year. Was here previously for this complaint but was not able to go to referral MD due to not having insurance. States will have insurance next month. Left knee has been swelling and "giving out" causing pt to fall.

## 2012-12-26 NOTE — ED Notes (Signed)
Pt here for re-occuring injury to (L) knee.

## 2012-12-28 NOTE — ED Provider Notes (Signed)
Medical screening examination/treatment/procedure(s) were performed by non-physician practitioner and as supervising physician I was immediately available for consultation/collaboration.  Phenix Vandermeulen, MD 12/28/12 1701 

## 2013-03-05 ENCOUNTER — Encounter (HOSPITAL_COMMUNITY): Payer: Self-pay | Admitting: *Deleted

## 2013-03-05 ENCOUNTER — Emergency Department (HOSPITAL_COMMUNITY)
Admission: EM | Admit: 2013-03-05 | Discharge: 2013-03-05 | Disposition: A | Discharge status: Home / Self Care | Payer: Self-pay | Attending: Emergency Medicine | Admitting: Emergency Medicine

## 2013-03-05 DIAGNOSIS — M25569 Pain in unspecified knee: Secondary | ICD-10-CM | POA: Insufficient documentation

## 2013-03-05 DIAGNOSIS — M1711 Unilateral primary osteoarthritis, right knee: Secondary | ICD-10-CM

## 2013-03-05 DIAGNOSIS — M1710 Unilateral primary osteoarthritis, unspecified knee: Secondary | ICD-10-CM | POA: Insufficient documentation

## 2013-03-05 DIAGNOSIS — F17200 Nicotine dependence, unspecified, uncomplicated: Secondary | ICD-10-CM | POA: Insufficient documentation

## 2013-03-05 DIAGNOSIS — M1712 Unilateral primary osteoarthritis, left knee: Secondary | ICD-10-CM

## 2013-03-05 DIAGNOSIS — M25561 Pain in right knee: Secondary | ICD-10-CM

## 2013-03-05 HISTORY — DX: Unspecified osteoarthritis, unspecified site: M19.90

## 2013-03-05 MED ORDER — OXYCODONE-ACETAMINOPHEN 5-325 MG PO TABS
1.0000 | ORAL_TABLET | Freq: Once | ORAL | Status: AC
  Administered 2013-03-05: 1 via ORAL
  Filled 2013-03-05: qty 1

## 2013-03-05 MED ORDER — IBUPROFEN 800 MG PO TABS: 800.0000 mg | ORAL_TABLET | Freq: Three times a day (TID) | ORAL | Status: DC

## 2013-03-05 NOTE — ED Notes (Signed)
Chronic knee pain. States is unable to go to referral MD because he has no insurance.

## 2013-03-05 NOTE — ED Notes (Signed)
PT ambulatory to bathroom with out assistance.

## 2013-03-05 NOTE — ED Notes (Signed)
Pt is here with bilateral knee pain and has been told it is arthritis and he needs pain control

## 2013-03-05 NOTE — ED Provider Notes (Signed)
CSN: [id number]     Arrival date & time 03/05/13  1610 History   This chart was scribed for non-physician practitioner, Dierdre Forth, PA-C, working with No att. providers found by Leone Payor, ED Scribe. This patient was seen in room TR11C/TR11C and the patient's care was started at 1610.   Chief Complaint  Patient presents with  . Knee Pain    The history is provided by the patient. No language interpreter was used.   HPI Comments: Gary Davis is a 57 y.o. male who presents to the Emergency Department complaining of chronic, constant bilateral knee pain that worsened today. Pt reports that this morning he felt both of his knees "give out" but he was able to catch himself and did not fall.  Pt denies hitting his head or LOC.   He reports that he was able to catch himself and did not fall to the ground. He states the last fall due to his knees occurred a few weeks ago, but he had no symptoms after that. Pt is taking Motrin for the pain but has recently run out. He denies numbness or tingling of the lower extremities.   Past Medical History  Diagnosis Date  . Arthritis    Past Surgical History  Procedure Laterality Date  . Arthroscopic repair acl     No family history on file. History  Substance Use Topics  . Smoking status: Current Every Day Smoker -- 0.50 packs/day    Types: Cigarettes  . Smokeless tobacco: Not on file  . Alcohol Use: Yes    Review of Systems  Constitutional: Negative for diaphoresis, appetite change and unexpected weight change.  HENT: Negative for mouth sores and neck stiffness.   Eyes: Negative for visual disturbance.  Respiratory: Negative for cough, chest tightness, shortness of breath and wheezing.   Cardiovascular: Negative for chest pain.  Gastrointestinal: Negative for nausea, vomiting, abdominal pain, diarrhea and constipation.  Endocrine: Negative for polydipsia, polyphagia and polyuria.  Genitourinary: Negative for dysuria, urgency,  frequency and hematuria.  Musculoskeletal: Positive for arthralgias (bilateral knee pain). Negative for back pain.  Skin: Negative for rash.  Allergic/Immunologic: Negative for immunocompromised state.  Neurological: Negative for syncope, light-headedness, numbness and headaches.  Hematological: Does not bruise/bleed easily.  Psychiatric/Behavioral: Negative for sleep disturbance. The patient is not nervous/anxious.     Allergies  Hydrocodone  Home Medications   Current Outpatient Rx  Name  Route  Sig  Dispense  Refill  . ibuprofen (ADVIL,MOTRIN) 200 MG tablet   Oral   Take 400 mg by mouth every 6 (six) hours as needed for pain.         Marland Kitchen ibuprofen (ADVIL,MOTRIN) 800 MG tablet   Oral   Take 1 tablet (800 mg total) by mouth 3 (three) times daily.   21 tablet   0    Triage Vitals: BP 109/65  Pulse 78  Temp(Src) 97.9 F (36.6 C) (Oral)  Resp 18  SpO2 97% Physical Exam  Nursing note and vitals reviewed. Constitutional: He is oriented to person, place, and time. He appears well-developed and well-nourished. No distress.  HENT:  Head: Normocephalic and atraumatic.  Eyes: Conjunctivae are normal.  Neck: Normal range of motion.  Cardiovascular: Normal rate, regular rhythm, normal heart sounds and intact distal pulses.   No murmur heard. Capillary refill < 3 sec  Pulmonary/Chest: Effort normal and breath sounds normal. No respiratory distress. He has no wheezes.  Musculoskeletal: He exhibits tenderness. He exhibits no edema.  Right knee: He exhibits normal range of motion, no swelling, no effusion, no ecchymosis, no deformity, no laceration and normal alignment. Tenderness found. Medial joint line tenderness noted.       Left knee: He exhibits normal range of motion, no swelling, no effusion, no ecchymosis, no deformity, no laceration, no erythema and normal alignment. Tenderness found. Medial joint line tenderness noted.  Tenderness to the medial joint lines Mild  effusion bilaterally No erythema No increased warmth No induration of the skin    Neurological: He is alert and oriented to person, place, and time. He exhibits normal muscle tone. Coordination normal.  Sensation intact.  Strength 5/5 bilaterally.   Skin: Skin is warm and dry. No rash noted. He is not diaphoretic. No erythema.  No tenting of the skin. Good capillary refill.   Psychiatric: He has a normal mood and affect. His behavior is normal.    ED Course  Procedures (including critical care time)  DIAGNOSTIC STUDIES: Oxygen Saturation is 97% on RA, normal by my interpretation.    COORDINATION OF CARE:  5:41 PM Discussed treatment plan with pt at bedside and pt agreed to plan.   Labs Review Labs Reviewed - No data to display Imaging Review No results found.  MDM   1. Knee pain, bilateral   2. Arthritis of knee, left   3. Arthritis of knee, right      Gary Davis presents with arthralgia of the knees.  Pt with bilateral knee x-rays in May of 2014 which showed arthritis.  No trauma since that time and no indication for further imaging.  Pt ambulates without difficulty in the ED.  Pain managed in ED. Pt is without systemic symptoms, erythema or redness of the joint consistent with gout or septic joint.  Pt advised to follow up with orthopedics if symptoms persist for further evaluation and treatment. Conservative therapy recommended and discussed including routine anti-inflammatory use until evaluation by an orthopedist. Patient will be dc home & is agreeable with above plan.  I personally performed the services described in this documentation, which was scribed in my presence. The recorded information has been reviewed and is accurate.   Dahlia Client Gary Hangartner, PA-C 03/06/13 [phone]

## 2013-03-08 NOTE — ED Provider Notes (Signed)
Medical screening examination/treatment/procedure(s) were performed by non-physician practitioner and as supervising physician I was immediately available for consultation/collaboration.  Glennette Galster David Ki Luckman, MD 03/08/13 1155 

## 2013-06-07 ENCOUNTER — Encounter (HOSPITAL_COMMUNITY): Payer: Self-pay | Admitting: Emergency Medicine

## 2013-06-07 ENCOUNTER — Emergency Department (HOSPITAL_COMMUNITY)
Admission: EM | Admit: 2013-06-07 | Discharge: 2013-06-07 | Disposition: A | Discharge status: Home / Self Care | Payer: Self-pay | Attending: Emergency Medicine | Admitting: Emergency Medicine

## 2013-06-07 DIAGNOSIS — M25569 Pain in unspecified knee: Secondary | ICD-10-CM | POA: Insufficient documentation

## 2013-06-07 DIAGNOSIS — F17200 Nicotine dependence, unspecified, uncomplicated: Secondary | ICD-10-CM | POA: Insufficient documentation

## 2013-06-07 DIAGNOSIS — M129 Arthropathy, unspecified: Secondary | ICD-10-CM | POA: Insufficient documentation

## 2013-06-07 DIAGNOSIS — G8929 Other chronic pain: Secondary | ICD-10-CM

## 2013-06-07 MED ORDER — TRAMADOL HCL 50 MG PO TABS: 50.0000 mg | ORAL_TABLET | Freq: Four times a day (QID) | ORAL | Status: DC | PRN

## 2013-06-07 MED ORDER — TRAMADOL HCL 50 MG PO TABS
50.0000 mg | ORAL_TABLET | Freq: Once | ORAL | Status: AC
  Administered 2013-06-07: 50 mg via ORAL
  Filled 2013-06-07: qty 1

## 2013-06-07 NOTE — ED Notes (Signed)
Pt complaining of bilateral knee pain and swelling.

## 2013-06-07 NOTE — ED Provider Notes (Signed)
CSN: [id number]     Arrival date & time 06/07/13  1237 History  This chart was scribed for non-physician practitioner, Antony Madura, PA-C working with Doug Sou, MD by Nicholos Johns, ED scribe. This patient was seen in room TR05C/TR05C and the patient's care was started at 1:22 PM.   Chief Complaint  Patient presents with  . Knee Pain   The history is provided by the patient. No language interpreter was used.   HPI Comments: Gary Davis is a 57 y.o. male who presents to the Emergency Department w/hx of arthritis complaining of chronic, constant, worsening, aching bilateral knee pain that started several months ago. Pt also has intermittent knee swelling. Pain worsened by sitting too long and then getting up or walking too long. Pt fell on the left knee 4 days ago but denies any other injuries to knees. Last prescribed medication in the ED provided little to no relief but the prescription given the visit prior resolved pain. Pt was previously prescribed sleeves for both knees but misplaced both while moving. States they provided some relief. Has been using motrin at home with little releif. Patient has not f/u with an Orthopedist although referred. Pt does not use any ambulatory mechanism. Denies loss of sensation, redness, heat to touch, fever, or pallor.   Past Medical History  Diagnosis Date  . Arthritis    Past Surgical History  Procedure Laterality Date  . Arthroscopic repair acl     History reviewed. No pertinent family history. History  Substance Use Topics  . Smoking status: Current Every Day Smoker -- 0.50 packs/day    Types: Cigarettes  . Smokeless tobacco: Not on file  . Alcohol Use: Yes    Review of Systems  Musculoskeletal: Positive for arthralgias and joint swelling. Negative for gait problem.  Skin: Negative for color change.  Neurological: Negative for numbness.  All other systems reviewed and are negative.   Allergies  Review of patient's allergies  indicates no active allergies.  Home Medications   Current Outpatient Rx  Name  Route  Sig  Dispense  Refill  . ibuprofen (ADVIL,MOTRIN) 200 MG tablet   Oral   Take 400 mg by mouth every 6 (six) hours as needed for pain.         . traMADol (ULTRAM) 50 MG tablet   Oral   Take 1 tablet (50 mg total) by mouth every 6 (six) hours as needed.   15 tablet   0    Triage Vitals: BP 109/65  Pulse 78  Temp(Src) 97.5 F (36.4 C) (Oral)  Resp 18  Ht 6\' 1"  (1.854 m)  Wt 180 lb (81.647 kg)  BMI 23.75 kg/m2  SpO2 99%  Physical Exam  Nursing note and vitals reviewed. Constitutional: He is oriented to person, place, and time. He appears well-developed and well-nourished. No distress.  HENT:  Head: Normocephalic and atraumatic.  Eyes: Conjunctivae and EOM are normal. No scleral icterus.  Neck: Normal range of motion.  Cardiovascular: Normal rate, regular rhythm and intact distal pulses.   Pulses:      Dorsalis pedis pulses are 2+ on the right side, and 2+ on the left side.       Posterior tibial pulses are 2+ on the right side, and 2+ on the left side.  Pulmonary/Chest: Effort normal. No respiratory distress.  Musculoskeletal: Normal range of motion. He exhibits tenderness. He exhibits no edema.  Tenderness to bilateral knees. No significant bony tenderness. No swelling, crepitus, effusion, deformities, erythema,  or heat to touch.  Neurological: He is alert and oriented to person, place, and time.  Reflex Scores:      Patellar reflexes are 2+ on the right side and 2+ on the left side.      Achilles reflexes are 2+ on the right side and 2+ on the left side. 5/5 strength against resistance in knees bilaterally with flexion and extension. No sensory or motor deficits appreciated. Weight bearing and ambulatory with normal gait and without assistance.  Skin: Skin is warm and dry. No rash noted. He is not diaphoretic. No erythema. No pallor.  Psychiatric: He has a normal mood and affect. His  behavior is normal.    ED Course  Procedures (including critical care time)  DIAGNOSTIC STUDIES: Oxygen Saturation is 99% on RA, normal by my interpretation.    Labs Review Labs Reviewed - No data to display  Imaging Review No results found.  EKG Interpretation   None       MDM   1. Knee pain, chronic, unspecified laterality    Patient with history of chronic knee pain presents for same today. Patient endorses a fall 4 days ago in his left knee, but he has been ambulatory since this time. Patient neurovascularly intact and ambulatory today with normal gait and without assistance. No bony deformities or bony tenderness appreciated. No evidence of septic joint bilaterally. Do not believe emergent imaging is indicated at this time. Patient on nontoxic-appearing, hemodynamically stable, and afebrile. Stable for discharge today with orthopedic followup which again was advised. Patient given bilateral knee sleeves for stability and advised to get a cane to use when ambulating. Ibuprofen recommended for mild to moderate pain and tramadol prescribed for breakthrough pain control. Return precautions discussed and patient agreeable to plan with no unaddressed concerns.  I personally performed the services described in this documentation, which was scribed in my presence. The recorded information has been reviewed and is accurate.    Antony Madura, PA-C 06/07/13 [phone]

## 2013-06-07 NOTE — ED Provider Notes (Signed)
Medical screening examination/treatment/procedure(s) were performed by non-physician practitioner and as supervising physician I was immediately available for consultation/collaboration.  EKG Interpretation   None        Doug Sou, MD 06/07/13 1625

## 2013-10-07 ENCOUNTER — Emergency Department (HOSPITAL_COMMUNITY)
Admission: EM | Admit: 2013-10-07 | Discharge: 2013-10-07 | Disposition: A | Discharge status: Home / Self Care | Payer: No Typology Code available for payment source | Attending: Emergency Medicine | Admitting: Emergency Medicine

## 2013-10-07 ENCOUNTER — Emergency Department (HOSPITAL_COMMUNITY): Payer: No Typology Code available for payment source

## 2013-10-07 ENCOUNTER — Encounter (HOSPITAL_COMMUNITY): Payer: Self-pay | Admitting: Emergency Medicine

## 2013-10-07 DIAGNOSIS — M129 Arthropathy, unspecified: Secondary | ICD-10-CM | POA: Insufficient documentation

## 2013-10-07 DIAGNOSIS — IMO0002 Reserved for concepts with insufficient information to code with codable children: Secondary | ICD-10-CM | POA: Insufficient documentation

## 2013-10-07 DIAGNOSIS — M792 Neuralgia and neuritis, unspecified: Secondary | ICD-10-CM

## 2013-10-07 DIAGNOSIS — M25569 Pain in unspecified knee: Secondary | ICD-10-CM | POA: Insufficient documentation

## 2013-10-07 DIAGNOSIS — G8929 Other chronic pain: Secondary | ICD-10-CM | POA: Insufficient documentation

## 2013-10-07 DIAGNOSIS — R5383 Other fatigue: Secondary | ICD-10-CM

## 2013-10-07 DIAGNOSIS — R5381 Other malaise: Secondary | ICD-10-CM | POA: Insufficient documentation

## 2013-10-07 DIAGNOSIS — F17200 Nicotine dependence, unspecified, uncomplicated: Secondary | ICD-10-CM | POA: Insufficient documentation

## 2013-10-07 HISTORY — DX: Unspecified cataract: H26.9

## 2013-10-07 LAB — CBC
HEMATOCRIT: 38 % — AB (ref 39.0–52.0)
HEMOGLOBIN: 13.8 g/dL (ref 13.0–17.0)
MCH: 34.7 pg — ABNORMAL HIGH (ref 26.0–34.0)
MCHC: 36.3 g/dL — AB (ref 30.0–36.0)
MCV: 95.5 fL (ref 78.0–100.0)
Platelets: 145 10*3/uL — ABNORMAL LOW (ref 150–400)
RBC: 3.98 MIL/uL — ABNORMAL LOW (ref 4.22–5.81)
RDW: 12.6 % (ref 11.5–15.5)
WBC: 4.8 10*3/uL (ref 4.0–10.5)

## 2013-10-07 LAB — BASIC METABOLIC PANEL
BUN: 16 mg/dL (ref 6–23)
CHLORIDE: 103 meq/L (ref 96–112)
CO2: 25 mEq/L (ref 19–32)
CREATININE: 0.99 mg/dL (ref 0.50–1.35)
Calcium: 8.6 mg/dL (ref 8.4–10.5)
GFR calc non Af Amer: 88 mL/min — ABNORMAL LOW (ref >90)
GLUCOSE: 98 mg/dL (ref 70–99)
Potassium: 3.8 mEq/L (ref 3.7–5.3)
Sodium: 142 mEq/L (ref 137–147)

## 2013-10-07 LAB — CBG MONITORING, ED: Glucose-Capillary: 99 mg/dL (ref 70–99)

## 2013-10-07 MED ORDER — ACETAMINOPHEN 500 MG PO TABS
1000.0000 mg | ORAL_TABLET | Freq: Once | ORAL | Status: AC
  Administered 2013-10-07: 1000 mg via ORAL
  Filled 2013-10-07: qty 2

## 2013-10-07 MED ORDER — GABAPENTIN 100 MG PO CAPS: 100.0000 mg | ORAL_CAPSULE | Freq: Three times a day (TID) | ORAL | Status: DC

## 2013-10-07 NOTE — ED Notes (Signed)
Spoke with Dr. Gwenlyn FudgeGoldstein, patient is up for discharge, not a stroke candidate and does not need a stroke swallow screen

## 2013-10-07 NOTE — ED Notes (Signed)
Verbal order  Given by steve to discontinue iv.

## 2013-10-07 NOTE — Discharge Instructions (Signed)
Take 100 mg three times per day for first 2 days. If pain is not improved, increase to 200 three times per day. Follow up with your doctor for further increases if this is not helping.   Emergency Department Resource Guide 1) Find a Doctor and Pay Out of Pocket Although you won't have to find out who is covered by your insurance plan, it is a good idea to ask around and get recommendations. You will then need to call the office and see if the doctor you have chosen will accept you as a new patient and what types of options they offer for patients who are self-pay. Some doctors offer discounts or will set up payment plans for their patients who do not have insurance, but you will need to ask so you aren't surprised when you get to your appointment.  2) Contact Your Local Health Department Not all health departments have doctors that can see patients for sick visits, but many do, so it is worth a call to see if yours does. If you don't know where your local health department is, you can check in your phone book. The CDC also has a tool to help you locate your state's health department, and many state websites also have listings of all of their local health departments.  3) Find a Walk-in Clinic If your illness is not likely to be very severe or complicated, you may want to try a walk in clinic. These are popping up all over the country in pharmacies, drugstores, and shopping centers. They're usually staffed by nurse practitioners or physician assistants that have been trained to treat common illnesses and complaints. They're usually fairly quick and inexpensive. However, if you have serious medical issues or chronic medical problems, these are probably not your best option.  No Primary Care Doctor: - Call Health Connect at  [phone] - they can help you locate a primary care doctor that  accepts your insurance, provides certain services, etc. - Physician Referral Service- [phone]  Chronic Pain  Problems: Organization         Address  Phone   Notes  [facility]  [phone] Patients need to be referred by their primary care doctor.   Medication Assistance: Organization         Address  Phone   Notes  [facility] Medication [facility] [address] [place], [place] [address] [phone]) [phone] --Must be a resident of [facility] [place] -- Must have NO insurance coverage whatsoever (no Medicaid/ Medicare, etc.) -- The pt. MUST have a primary care doctor that directs their care regularly and follows them in the community   MedAssist  [phone]   [employer]  [phone]    Agencies that provide inexpensive medical care: Organization         Address  Phone   Notes  [facility]  [phone]) [phone]   [facility]    [phone]   [facility] [address] [place], [place] [address] [phone]   [facility] [address], [place] [phone]) [phone]   [facility]    [phone](336) [phone]   [facility]    [phone]   [facility]  [address], [place] Phone:  [phone], Fax:  [phone]) [phone] Hours of Operation:  9 am - 6 pm, M-F.  Also accepts Medicaid/Medicare and self-pay.  [facility]  [address], [place] Phone: [phone](336) [phone], Fax: [phone]. Hours of Operation:  8:30 am - 5:30 pm, M-F.  Also accepts Medicaid and self-pay.  [facility] [place] [address], [place] Phone: [phone]) [phone]   [facility] [address], [place], [place] [phone], Ext. [phone] Mondays & Thursdays: 7-9 AM.  First 15 patients are seen on a first come, first serve basis.    Medicaid-accepting [facility] Providers:  Organization         Address  Phone   Notes  [facility] [address], [place] [phone]) [phone] Also  accepts self-pay patients.  [facility] [address] [place], [place]  [phone]) [phone]   [facility] [address], [place] [phone]) [phone]   [facility] [address] [place] [address], [place] [phone](336) [phone]   Renaye RakersVeita Bland [address], [place]   [phone] Only accepts [place] Access [place] patients after they have their name applied to their card.   Self-Pay (no insurance) in [facility]:  Organization         Address  Phone   Notes  Sickle Cell Patients, [facility] [address], [place] [phone](336) [phone]   [facility] [address], [place] [phone]) [phone]   [facility] [place]  [address], [place] [phone]   [facility]/Dr. Osei-Bonsu  [address] [place] [address], [place] or [phone], [place] [phone]) [phone] Phone number for both [place] and [place] locations is the same.  [facility] [address], [place] [phone]   [facility]  [address] [place] [address], [place] or [address] [phone]) [phone]) [phone]   [facility] [address], [place] [phone](336) [phone], phone; [phone](336) [phone], fax Sees patients 1st and 3rd Saturday of every month.  Must not qualify for public or private insurance (i.e. Medicaid, Medicare, [place] Health Choice, Veterans' Benefits)  Household income should be no more than 200% of the poverty level The clinic cannot treat you if you are pregnant or think you are pregnant  Sexually transmitted diseases are not treated at the clinic.    Dental Care: Organization         Address  Phone  Notes  [facility] [address], [place] [phone]) [phone] Accepts children up to age 10921 who are enrolled in [place]icaid or [place] Health Choice; pregnant  women with a Medicaid card; and children who have applied for Medicaid or [place] Health Choice, but were declined, whose parents can pay a reduced fee at time of service.  [facility] of [facility] [place]  [address], [place] [phone]) [phone] Accepts children up to age 60 who are enrolled in [place]icaid or [place] Health Choice; pregnant women with a Medicaid card; and children who have applied for Medicaid or [place] Health Choice, but were declined, whose parents can pay a reduced fee at time of service.  [facility]  [address], [place] [phone]) [phone] Patients are seen by appointment only. Walk-ins are not accepted. [facility] will see patients 60 years of age and older. Monday - Tuesday (8am-5pm) Most Wednesdays (8:30-5pm) $30 per visit, cash  only  [facility]  [address], [facility] [phone] Patients are seen by appointment only. Walk-ins are not accepted. [facility] will see patients 60 years of age and older. One Wednesday Evening (Monthly: Volunteer Based).  $30 per visit, cash only  [facility]  [phone]) [phone] for adults; Children under age 524, call Graduate Pediatric Dentistry at [phone]. Children aged 644-14, please call [phone]) [phone] to request a pediatric application.  Dental services are provided in all areas of dental care including fillings, crowns and bridges, complete and partial dentures, implants, gum treatment, root canals, and extractions. Preventive care is also provided. Treatment is provided to both adults and children. Patients are selected via a lottery and there is often a waiting list.   [facility] [address], [place]  [phone]) [phone] [email or web address]   [facility] [address], [place], [place] [phone], Ext. [phone] Second and Fourth Thursday of each month, opens at 6:30 AM; Clinic ends at 9 AM.  Patients are  seen on a first-come first-served basis, and a limited number are seen during each clinic.   [facility]  [address], [place], [place] [phone]) [phone]   Eligibility Requirements You must have lived in [place], [place], or [place] counties for at least the last three months.   You cannot be eligible for state or federal sponsored National Cityhealthcare insurance, including CIGNAVeterans Administration, [place]icaid, or [employer] EntertainmentMedicare.   You generally cannot be eligible for healthcare insurance through your employer.    How to apply: Eligibility screenings are held every Tuesday and Wednesday afternoon from 1:00 pm until 4:00 pm. You do not need an appointment for the interview!  [facility] [address], [place], [place] [phone]   [facility]  [phone]   [facility]  [phone]   [facility]  [phone]    Behavioral Health Resources in the Community: Intensive Outpatient Programs Organization         Address  Phone  Notes  [facility] [address], [place], [place] [phone]   [facility] [address], [place], [place] [phone]   [facility] [address], [place], [place]  [phone]   [facility] [address],  [place], [place] [phone] or [phone]   Substance Abuse Resources Organization         Address  Phone  Notes  [facility]  [phone]   [facility]  [phone]   [facility]  [phone]   [facility]  [phone]   Residential & Outpatient Substance Abuse Program  [phone]   Psychological Services Organization         Address  Phone  Notes  [facility]  [phone]- [phone]   [facility]  [phone]- [phone]   [facility] [address], [place] [phone] or [phone]    Mobile Crisis  Teams Organization         Address  Phone  Notes  [facility], Mobile Crisis Care Unit  [phone]   [facility]  [address] [place], [place] [phone]   Doristine LocksSharon DeEsch [address] [place] [phone]    Self-Help/Support Groups Organization         Address  Phone  Notes  [facility] [place] - variety of support groups  336- I7437963 Call for more information  Narcotics Anonymous (NA), [facility] [address], [employer] [place]  2 meetings at this location   Statistician         Address  Phone  Notes  [facility] [address],    [place]  [phone]   [facility]  [address] [place] [address], [place], [place] [phone]   [facility] [address] [place], [place] [phone] Admissions: 8am-3pm M-F  [facility] [address]    [place], [place] [phone]   [facility] [address] [place], [place], [place] [phone]   [facility] [address],  [place], [place] [phone]   [facility] - Intensive Outpatient [address], [place], [place] [phone]   [facility] ([facility] [address]  [place], [place] [phone] or [phone]   [facility] (RTS) [address], [place], [place] [phone] Accepts Medicaid  [facility] [place] [address],  [place] [phone] Substance Abuse/Addiction Treatment   [facility] Organization         Address  Phone  Notes  [facility]  [phone]   Angie Fava, PhD [address] [place] [place], [place]   [phone] or [phone]   [facility]   [address] [place], [place] [phone]   [facility] [address], [place], [place] [phone]  Insurance/Medicaid/sponsorship through [facility] [address]                                    [place], [place] [phone] Therapy/tele-psych/case  [facility] [address], [place] [phone]    Dr. Lolly Mustache  [phone]   [facility]  [employer] [facility] Dept. 1) [address], [place] 2) [address] [place] 3)  [address], [place] [phone]  [phone]   [facility] Child Abuse Hotline 712-245-1078 or [phone] (After Hours)

## 2013-10-07 NOTE — ED Provider Notes (Signed)
CSN: [id number]     Arrival date & time 10/07/13  0741 History   First MD Initiated Contact with Patient 10/07/13 [phone]     Chief Complaint  Patient presents with  . Numbness     (Consider location/radiation/quality/duration/timing/severity/associated sxs/prior Treatment) HPI 58 year old male presents with 2 weeks of left arm numbness, weakness, and pain. 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 His been constant over these 2 weeks. Denies any headaches, neck pain, neck stiffness, or pain with range of motion of neck. He states a few days after starting his left hand he started noticing in his right. The symptoms on the right are significantly less severe than on his left. He has tried ibuprofen for the pain without any relief. Denies any lower extremity symptoms. The pain currently is severe. He states when he bends his elbow he seems to have more pain/numb feeling. Currently his whole right hand and distal forearm feel numb/tingling and pain. His left arm symptoms radiate up to his upper arm. The symptoms seem to be worse from middle finger to pinky but sometimes involve his thumb and index finger.  Past Medical History  Diagnosis Date  . Arthritis    Past Surgical History  Procedure Laterality Date  . Arthroscopic repair acl     No family history on file. History  Substance Use Topics  . Smoking status: Current Every Day Smoker -- 0.50 packs/day    Types: Cigarettes  . Smokeless tobacco: Not on file  . Alcohol Use: Yes    Review of Systems  Constitutional: Negative for fever.  Respiratory: Negative for shortness of breath.   Musculoskeletal: Negative for neck pain and neck stiffness.       Chronic knee pain, not worse currently  Neurological: Positive for weakness and numbness. Negative for speech difficulty and headaches.  All other systems reviewed and are negative.      Allergies  Review of patient's allergies indicates no active  allergies.  Home Medications   Current Outpatient Rx  Name  Route  Sig  Dispense  Refill  . ibuprofen (ADVIL,MOTRIN) 200 MG tablet   Oral   Take 400 mg by mouth every 6 (six) hours as needed for pain.         . traMADol (ULTRAM) 50 MG tablet   Oral   Take 1 tablet (50 mg total) by mouth every 6 (six) hours as needed.   15 tablet   0    There were no vitals taken for this visit. Physical Exam  Nursing note and vitals reviewed. Constitutional: He is oriented to person, place, and time. He appears well-developed and well-nourished. No distress.  HENT:  Head: Normocephalic and atraumatic.  Right Ear: External ear normal.  Left Ear: External ear normal.  Nose: Nose normal.  Eyes: EOM are normal. Pupils are equal, round, and reactive to light. Right eye exhibits no discharge. Left eye exhibits no discharge.  Neck: Neck supple.  Cardiovascular: Normal rate, regular rhythm, normal heart sounds and intact distal pulses.   Pulses:      Radial pulses are 2+ on the right side, and 2+ on the left side.  Pulmonary/Chest: Effort normal and breath sounds normal.  Abdominal: Soft. There is no tenderness.  Musculoskeletal: He exhibits no edema.  Neurological: He is alert and oriented to person, place, and time.  CN 2-12 grossly intact. Mildly decreased grip strength in left hand. Otherwise his strength is normal in all  4 extremities. Diffuse decreased sensation to light touch in LUE. Mildly better on index finger. RUE similar except numbness to touch is localized to hand  Skin: Skin is warm and dry.    ED Course  Procedures (including critical care time) Labs Review Labs Reviewed  CBC - Abnormal; Notable for the following:    RBC 3.98 (*)    HCT 38.0 (*)    MCH 34.7 (*)    MCHC 36.3 (*)    Platelets 145 (*)    All other components within normal limits  BASIC METABOLIC PANEL - Abnormal; Notable for the following:    GFR calc non Af Amer 88 (*)    All other components within normal  limits  CBG MONITORING, ED   Imaging Review Ct Head Wo Contrast  10/07/2013   CLINICAL DATA:  Right arm numbness  EXAM: CT HEAD WITHOUT CONTRAST  TECHNIQUE: Contiguous axial images were obtained from the base of the skull through the vertex without intravenous contrast.  COMPARISON:  CT HEAD W/O CM dated 05/15/2007  FINDINGS: No acute intracranial abnormality. Specifically, no hemorrhage, hydrocephalus, mass lesion, acute infarction, or significant intracranial injury. No acute calvarial abnormality. The visualized paranasal sinuses and mastoid air cells are patent.  IMPRESSION: No acute intracranial abnormality.   Electronically Signed   By: Salome Holmes M.D.   On: 10/07/2013 08:42     EKG Interpretation None      MDM   Final diagnoses:  Neuropathic pain    Patient's symptoms appear to be from peripheral nerve etiology. Given that he has some mild weakness on left, head CT ordered, is normal. Doubt stroke given bilateral symptoms however. No neck pain, stiffness or worsening of symptoms with ROM. D/w neurology, Dr. Roseanne Reno, who advises neurontin, f/u with PCP and with neuro for nerve studies.     Audree Camel, MD 10/07/13 [phone]

## 2013-10-07 NOTE — ED Notes (Signed)
Numbness in hands, bilateral for 2 days, worse on left, right numbness started approx 12 days ago per patient, decreased strength and movement in some fingers, denies headache/neck pain, denies chest pain and sob, denies changes in vision, some numbness extending up to upper arm on left side.  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

## 2014-02-04 ENCOUNTER — Encounter (HOSPITAL_COMMUNITY): Payer: Self-pay | Admitting: Emergency Medicine

## 2014-02-04 ENCOUNTER — Emergency Department (HOSPITAL_COMMUNITY): Payer: No Typology Code available for payment source

## 2014-02-04 ENCOUNTER — Inpatient Hospital Stay (HOSPITAL_COMMUNITY)
Admission: EM | Admit: 2014-02-04 | Discharge: 2014-02-11 | DRG: 473 | Disposition: A | Discharge status: Rehabilitation Facility | Payer: No Typology Code available for payment source | Attending: Neurosurgery | Admitting: Neurosurgery

## 2014-02-04 DIAGNOSIS — M129 Arthropathy, unspecified: Secondary | ICD-10-CM

## 2014-02-04 DIAGNOSIS — M1990 Unspecified osteoarthritis, unspecified site: Secondary | ICD-10-CM | POA: Diagnosis present

## 2014-02-04 DIAGNOSIS — M4712 Other spondylosis with myelopathy, cervical region: Principal | ICD-10-CM | POA: Diagnosis present

## 2014-02-04 DIAGNOSIS — G9520 Unspecified cord compression: Secondary | ICD-10-CM | POA: Diagnosis present

## 2014-02-04 DIAGNOSIS — R209 Unspecified disturbances of skin sensation: Secondary | ICD-10-CM | POA: Diagnosis present

## 2014-02-04 DIAGNOSIS — G959 Disease of spinal cord, unspecified: Secondary | ICD-10-CM

## 2014-02-04 DIAGNOSIS — R29898 Other symptoms and signs involving the musculoskeletal system: Secondary | ICD-10-CM

## 2014-02-04 DIAGNOSIS — F17200 Nicotine dependence, unspecified, uncomplicated: Secondary | ICD-10-CM | POA: Diagnosis present

## 2014-02-04 LAB — COMPREHENSIVE METABOLIC PANEL
ALBUMIN: 4.3 g/dL (ref 3.5–5.2)
ALK PHOS: 81 U/L (ref 39–117)
ALT: 12 U/L (ref 0–53)
ANION GAP: 17 — AB (ref 5–15)
AST: 26 U/L (ref 0–37)
BUN: 18 mg/dL (ref 6–23)
CALCIUM: 9.1 mg/dL (ref 8.4–10.5)
CO2: 23 mEq/L (ref 19–32)
CREATININE: 0.84 mg/dL (ref 0.50–1.35)
Chloride: 98 mEq/L (ref 96–112)
GFR calc non Af Amer: >90 mL/min (ref >90)
GLUCOSE: 103 mg/dL — AB (ref 70–99)
POTASSIUM: 4.1 meq/L (ref 3.7–5.3)
Sodium: 138 mEq/L (ref 137–147)
TOTAL PROTEIN: 7.7 g/dL (ref 6.0–8.3)
Total Bilirubin: 0.9 mg/dL (ref 0.3–1.2)

## 2014-02-04 LAB — CBC WITH DIFFERENTIAL/PLATELET
BASOS PCT: 0 % (ref 0–1)
Basophils Absolute: 0 10*3/uL (ref 0.0–0.1)
EOS ABS: 0.1 10*3/uL (ref 0.0–0.7)
EOS PCT: 1 % (ref 0–5)
HCT: 42.6 % (ref 39.0–52.0)
HEMOGLOBIN: 15.2 g/dL (ref 13.0–17.0)
LYMPHS ABS: 1.4 10*3/uL (ref 0.7–4.0)
Lymphocytes Relative: 31 % (ref 12–46)
MCH: 34.6 pg — AB (ref 26.0–34.0)
MCHC: 35.7 g/dL (ref 30.0–36.0)
MCV: 97 fL (ref 78.0–100.0)
MONO ABS: 0.6 10*3/uL (ref 0.1–1.0)
MONOS PCT: 12 % (ref 3–12)
NEUTROS PCT: 56 % (ref 43–77)
Neutro Abs: 2.5 10*3/uL (ref 1.7–7.7)
Platelets: 139 10*3/uL — ABNORMAL LOW (ref 150–400)
RBC: 4.39 MIL/uL (ref 4.22–5.81)
RDW: 12.5 % (ref 11.5–15.5)
WBC: 4.5 10*3/uL (ref 4.0–10.5)

## 2014-02-04 LAB — CBG MONITORING, ED: GLUCOSE-CAPILLARY: 102 mg/dL — AB (ref 70–99)

## 2014-02-04 LAB — PROTIME-INR
INR: 1.07 (ref 0.00–1.49)
Prothrombin Time: 13.9 seconds (ref 11.6–15.2)

## 2014-02-04 LAB — APTT: APTT: 28 s (ref 24–37)

## 2014-02-04 MED ORDER — NICOTINE 7 MG/24HR TD PT24
7.0000 mg | MEDICATED_PATCH | Freq: Every day | TRANSDERMAL | Status: DC
  Administered 2014-02-04 – 2014-02-11 (×8): 7 mg via TRANSDERMAL
  Filled 2014-02-04 (×10): qty 1

## 2014-02-04 MED ORDER — ALUM & MAG HYDROXIDE-SIMETH 200-200-20 MG/5ML PO SUSP: 30.0000 mL | Freq: Four times a day (QID) | ORAL | Status: DC | PRN

## 2014-02-04 MED ORDER — ONDANSETRON HCL 4 MG/2ML IJ SOLN: 4.0000 mg | Freq: Four times a day (QID) | INTRAMUSCULAR | Status: DC | PRN

## 2014-02-04 MED ORDER — HYDROCODONE-ACETAMINOPHEN 5-325 MG PO TABS
2.0000 | ORAL_TABLET | Freq: Once | ORAL | Status: AC
  Administered 2014-02-04: 2 via ORAL
  Filled 2014-02-04: qty 2

## 2014-02-04 MED ORDER — MORPHINE SULFATE 4 MG/ML IJ SOLN
4.0000 mg | Freq: Once | INTRAMUSCULAR | Status: AC
  Administered 2014-02-04: 4 mg via INTRAVENOUS
  Filled 2014-02-04: qty 1

## 2014-02-04 MED ORDER — GABAPENTIN 100 MG PO CAPS
100.0000 mg | ORAL_CAPSULE | Freq: Three times a day (TID) | ORAL | Status: DC
  Administered 2014-02-04 – 2014-02-11 (×20): 100 mg via ORAL
  Filled 2014-02-04 (×20): qty 1

## 2014-02-04 MED ORDER — DEXAMETHASONE SODIUM PHOSPHATE 4 MG/ML IJ SOLN: 4.0000 mg | Freq: Once | INTRAMUSCULAR | Status: DC

## 2014-02-04 MED ORDER — HYDROMORPHONE HCL PF 1 MG/ML IJ SOLN
0.5000 mg | INTRAMUSCULAR | Status: DC | PRN
  Administered 2014-02-04 – 2014-02-11 (×42): 1 mg via INTRAVENOUS
  Filled 2014-02-04 (×38): qty 1

## 2014-02-04 MED ORDER — DEXAMETHASONE SODIUM PHOSPHATE 10 MG/ML IJ SOLN
10.0000 mg | Freq: Four times a day (QID) | INTRAMUSCULAR | Status: DC
  Administered 2014-02-04: 10 mg via INTRAVENOUS
  Filled 2014-02-04: qty 1

## 2014-02-04 MED ORDER — ACETAMINOPHEN 325 MG PO TABS: 650.0000 mg | ORAL_TABLET | Freq: Four times a day (QID) | ORAL | Status: DC | PRN

## 2014-02-04 MED ORDER — DEXAMETHASONE SODIUM PHOSPHATE 10 MG/ML IJ SOLN
10.0000 mg | Freq: Once | INTRAMUSCULAR | Status: AC
  Administered 2014-02-04: 10 mg via INTRAVENOUS
  Filled 2014-02-04: qty 1

## 2014-02-04 MED ORDER — PANTOPRAZOLE SODIUM 40 MG IV SOLR
40.0000 mg | Freq: Two times a day (BID) | INTRAVENOUS | Status: DC
  Administered 2014-02-04 – 2014-02-05 (×3): 40 mg via INTRAVENOUS
  Filled 2014-02-04 (×3): qty 40

## 2014-02-04 MED ORDER — ACETAMINOPHEN 650 MG RE SUPP: 650.0000 mg | Freq: Four times a day (QID) | RECTAL | Status: DC | PRN

## 2014-02-04 MED ORDER — SODIUM CHLORIDE 0.9 % IV SOLN
INTRAVENOUS | Status: DC
  Administered 2014-02-04 – 2014-02-06 (×5): via INTRAVENOUS
  Administered 2014-02-07: 1000 mL via INTRAVENOUS
  Administered 2014-02-08: 900 mL via INTRAVENOUS
  Administered 2014-02-08: 500 mL via INTRAVENOUS
  Administered 2014-02-09: via INTRAVENOUS

## 2014-02-04 MED ORDER — GADOBENATE DIMEGLUMINE 529 MG/ML IV SOLN
17.0000 mL | Freq: Once | INTRAVENOUS | Status: AC | PRN
  Administered 2014-02-04: 17 mL via INTRAVENOUS

## 2014-02-04 MED ORDER — DEXAMETHASONE SODIUM PHOSPHATE 10 MG/ML IJ SOLN
10.0000 mg | Freq: Four times a day (QID) | INTRAMUSCULAR | Status: DC
  Administered 2014-02-05 – 2014-02-06 (×6): 10 mg via INTRAVENOUS
  Filled 2014-02-04 (×6): qty 1

## 2014-02-04 MED ORDER — ONDANSETRON HCL 4 MG PO TABS: 4.0000 mg | ORAL_TABLET | Freq: Four times a day (QID) | ORAL | Status: DC | PRN

## 2014-02-04 MED ORDER — OXYCODONE HCL 5 MG PO TABS
5.0000 mg | ORAL_TABLET | ORAL | Status: DC | PRN
  Administered 2014-02-05 – 2014-02-06 (×4): 5 mg via ORAL
  Filled 2014-02-04 (×5): qty 1

## 2014-02-04 NOTE — ED Notes (Signed)
Per pt sts that he has been having bilateral hand pain and numbness, knee pain and swelling and loss of feeling in both hands. sts for 2 weeks grips are weak.

## 2014-02-04 NOTE — ED Notes (Signed)
Neuro surgery at bedside.

## 2014-02-04 NOTE — Progress Notes (Signed)
ED CM Consulted by Celesta Gentile RN on Pod E  Concerning establishing  f/u care with PCP, patient does not have insurance. Pt presented to [facility] ED today with bilateral hand pain and numbness, knee pain and swelling and loss of feeling in both hands.  Met with patient at bedside, confirmed informaton. Pt  reports not having a PCP, or insurance coverage. Pt states he lost his job earlier this year.  Discussed  importance and benefits of establishing f/u with a PCP,  and not utilizing the ED for primary care needs. Pt verbalized understanding and is in agreement. Provided information about [place] and their services.  [place] Counselor is on-site to assist with [employer] and process.  Patient verbalized understanding and expressed  interest in the [facility].  Patient agreeable to plan for establishing PC. Patient being admitted to Neurology Service.  ED CM will contact [place] to establish Primary Care. Patient made aware that unit CM will continue to follow with discharge plan.

## 2014-02-04 NOTE — H&P (Signed)
[facility] Admission History and Physical       Gary Davis WUJ:[id number] DOB: 1955/07/06 DOA: 02/04/2014  Referring physician: EDP PCP: No PCP Per Patient  Specialists:   Chief Complaint: Numbness and Weakness in Both Hands  HPI: Gary Davis is a 58 y.o. male with a history of HTN and Arthritis who presents to the ED with complaints of numbness and weakness in both of his hands over the past 2 weeks.   He reports not being able to grasp and hold objects due to his symptoms.   He denies any trauma to his neck or back.   He was first seen about this problem in April of 2015 and had a MRI and was told he had a pinched nerve, however he was not able to followup as an outpatient.  He reports that he was not severely bothered until the past 2 weeks.   In the ED, a Ct Scan of the Head and Neurology was consulted, and then an MRI of the C-Spine were performed and the MRI revealed  Spinal cord compression of the C-spine at the levels of C3 thought C6.  Neurosurgery Dr. Wynetta Emery was consulted and patient was referred to medicine for admission.     Review of Systems:  Constitutional: No Weight Loss, No Weight Gain, Night Sweats, Fevers, Chills, Dizziness, Fatigue, or Generalized Weakness HEENT: No Headaches, Difficulty Swallowing,Tooth/Dental Problems,Sore Throat,  No Sneezing, Rhinitis, Ear Ache, Nasal Congestion, or Post Nasal Drip,  Cardio-vascular:  No Chest pain, Orthopnea, PND, Edema in Lower Extremities, Anasarca, Dizziness, Palpitations  Resp: No Dyspnea, No DOE, No Productive Cough, No Non-Productive Cough, No Hemoptysis, No Wheezing.    GI: No Heartburn, Indigestion, Abdominal Pain, Nausea, Vomiting, Diarrhea, Hematemesis, Hematochezia, Melena, Change in Bowel Habits,  Loss of Appetite  GU: No Dysuria, Change in Color of Urine, No Urgency or Frequency, No Flank pain.  Musculoskeletal: No Joint Pain or Swelling, No Decreased Range of Motion, No Back Pain.  Neurologic: No  Syncope, No Seizures,+Muscle Weakness of Upper Extremities, +Paresthesias of Upper Extremities, Vision Disturbance or Loss, No Diplopia, No Vertigo, No Difficulty Walking,  Skin: No Rash or Lesions. Psych: No Change in Mood or Affect, No Depression or Anxiety, No Memory loss, No Confusion, or Hallucinations   Past Medical History  Diagnosis Date  . Arthritis   . Cataract     Past Surgical History  Procedure Laterality Date  . Arthroscopic repair acl Right   . Orif ankle fracture Right   . Finger fracture surgery      Right 4th Finger     Prior to Admission medications   Medication Sig Start Date End Date Taking? Authorizing Provider  gabapentin (NEURONTIN) 100 MG capsule Take 1 capsule (100 mg total) by mouth 3 (three) times daily. 10/07/13  Yes Audree Camel, MD  ibuprofen (ADVIL,MOTRIN) 200 MG tablet Take 400 mg by mouth every 6 (six) hours as needed for moderate pain.    Yes Historical Provider, MD     No Known Allergies   Social History:  reports that he has been smoking Cigarettes.  He has been smoking about 0.33 packs per day. He does not have any smokeless tobacco history on file. He reports that he drinks alcohol. He reports that he does not use illicit drugs.     Family History  Problem Relation Age of Onset  . Family history unknown: Yes       Physical Exam:  GEN:  Pleasant Thin 58 y.o.  African American male examined  and in no acute distress; cooperative with exam Filed Vitals:   02/04/14 2015 02/04/14 2027 02/04/14 2030 02/04/14 2100  BP: 130/80 130/80 122/76 136/77  Pulse: 79 64 25 71  Temp:      Resp: 18 21 15 14   SpO2: 100% 95% 99% 88%   Blood pressure 136/77, pulse 71, temperature 98.1 F (36.7 C), resp. rate 14, SpO2 88.00%. PSYCH: SHe is alert and oriented x4; does not appear anxious does not appear depressed; affect is normal HEENT: Normocephalic and Atraumatic, Mucous membranes pink; PERRLA; EOM intact; Fundi:  Benign;  No scleral icterus,  Nares: Patent, Oropharynx: Clear, Fair Dentition, Neck:  FROM, No Cervical Lymphadenopathy nor Thyromegaly or Carotid Bruit; No JVD; Breasts:: Not examined CHEST WALL: No tenderness CHEST: Normal respiration, clear to auscultation bilaterally HEART: Regular rate and rhythm; no murmurs rubs or gallops BACK: No kyphosis or scoliosis; No CVA tenderness ABDOMEN: Positive Bowel Sounds, Scaphoid, Soft Non-Tender; No Masses, No Organomegaly.   Rectal Exam: Not done EXTREMITIES: No Cyanosis, Clubbing, or Edema; No Ulcerations. Genitalia: not examined PULSES: 2+ and symmetric SKIN: Normal hydration no rash or ulceration CNS: Alert and Oriented x 4, Cranial Nerves  II=XII intact,  Decreased Sensation of Both Upper Extremities, and Decreased Strength in Bilateral Upper Extremities 4/5  Vascular: pulses palpable throughout    Labs on Admission:  Basic Metabolic Panel:  Recent Labs Lab 02/04/14 1549  NA 138  K 4.1  CL 98  CO2 23  GLUCOSE 103*  BUN 18  CREATININE 0.84  CALCIUM 9.1   Liver Function Tests:  Recent Labs Lab 02/04/14 1549  AST 26  ALT 12  ALKPHOS 81  BILITOT 0.9  PROT 7.7  ALBUMIN 4.3   No results found for this basename: LIPASE, AMYLASE,  in the last 168 hours No results found for this basename: AMMONIA,  in the last 168 hours CBC:  Recent Labs Lab 02/04/14 1549  WBC 4.5  NEUTROABS 2.5  HGB 15.2  HCT 42.6  MCV 97.0  PLT 139*   Cardiac Enzymes: No results found for this basename: CKTOTAL, CKMB, CKMBINDEX, TROPONINI,  in the last 168 hours  BNP (last 3 results) No results found for this basename: PROBNP,  in the last 8760 hours CBG:  Recent Labs Lab 02/04/14 1656  GLUCAP 102*    Radiological Exams on Admission: Ct Head Wo Contrast  02/04/2014   CLINICAL DATA:  Pain numbness.  Recent fall.  EXAM: CT HEAD WITHOUT CONTRAST  TECHNIQUE: Contiguous axial images were obtained from the base of the skull through the vertex without intravenous contrast.   COMPARISON:  10/07/2013.  FINDINGS: No mass. No hydrocephalus. No hemorrhage. No acute bony abnormality. Visualized paranasal sinuses are clear. Postsurgical changes right maxillary sinus.  IMPRESSION: No acute abnormality.   Electronically Signed   By: Maisie Fus  Register   On: 02/04/2014 17:01   Mr Cervical Spine W Wo Contrast  02/04/2014   CLINICAL DATA:  59 year old male with bilateral hand pain numbness and Severe weakness. Initial encounter.  EXAM: MRI CERVICAL SPINE WITHOUT AND WITH CONTRAST  TECHNIQUE: Multiplanar and multiecho pulse sequences of the cervical spine, to include the craniocervical junction and cervicothoracic junction, were obtained according to standard protocol without and with intravenous contrast.  CONTRAST:  17mL MULTIHANCE GADOBENATE DIMEGLUMINE 529 MG/ML IV SOLN  COMPARISON:  Head CT without contrast 1628 hr the same day and earlier.  FINDINGS: Straightening of cervical lordosis. Multilevel degenerative endplate marrow signal changes. Mild marrow  edema and enhancement involving the posterior inferior C4 vertebra, appears to be degenerative in nature. No acute osseous abnormality suspected.  No other abnormal enhancement identified.  Cervicomedullary junction is within normal limits.  Severe multifactorial, degenerative spinal stenosis associated with Severe spinal cord compression from C3-C4 to C5-C6. Associated abnormal spinal cord signal at these levels most apparent at the C4 and C5 levels (series 300, image 7 and series 500, image 6).  Normal visualized upper thoracic spinal cord. See additional degenerative details below.  Negative paraspinal soft tissues.  There is a degree of congenital spinal canal narrowing in the cervical spine diffusely.  C2-C3: Mild overall spinal stenosis with mild broad-based disc protrusion. Mild to moderate bilateral C3 foraminal stenosis.  C3-C4: Severe spinal stenosis with thecal sac reduced to 4 mm. This is related to bulky circumferential disc  osteophyte complex and ligament flavum hypertrophy superimposed on congenital canal narrowing. Severe bilateral foraminal stenosis.  C4-C5: Severe spinal stenosis, multifactorial with circumferential disc osteophyte complex, broad-based posterior component of disc. Thecal sac reduced to 4 mm. Severe bilateral foraminal stenosis.  C5-C6: Moderate to severe spinal stenosis. Disc osteophyte complex with broad-based posterior disc and ligament flavum hypertrophy superimposed on congenital canal narrowing. Thecal sac reduced to 5-6 mm. Severe bilateral foraminal stenosis.  C6-C7: Circumferential disc osteophyte complex. Mild spinal stenosis with minimal spinal cord mass effect. Moderate bilateral foraminal stenosis.  C7-T1: Moderate facet and uncovertebral hypertrophy on the left. Mild to moderate left C8 foraminal stenosis.  No upper thoracic spinal stenosis.  IMPRESSION: 1. Multilevel cervical spinal cord compression due to severe combined degenerative and congenital stenosis. 2. The level C3-C4 through C5-C6 are affected, with spinal cord signal abnormality which probably represents a combination of myelomalacia and edema in this clinical setting. 3. Additional degenerative widespread cervical neural foraminal stenosis, mostly severe. Critical Value/emergent results were called by telephone at the time of interpretation on 02/04/2014 at 7:20 pm to Dr. Chaney MallingAVID YAO , who verbally acknowledged these results.   Electronically Signed   By: Augusto GambleLee  Hall M.D.   On: 02/04/2014 19:22     EKG:  Ordered   Assessment/Plan:   58 y.o. male with  Principal Problem:    1.   Cord compression syndrome/Bilateral arm weakness- Seen By NeuroSurgery Dr. Wynetta Emeryram, and Neurology   IV Decadron 10 mg q 6 hrs to reduce Inflammation   Surgery planning per Dr Wynetta Emeryram NeuroSurgery   NPO AFTER MIDNIGHT   EKG,a dn PT/PPT ordered     2.   Arthritis   Pain  Control PRN     3.   Tobacco use disorder   Nicotine Patch Daily    4.    DVT  Prophylaxis   SCDs     Code Status:      FULL CODE Family Communication:    No Family Present Disposition Plan:  Inpatient       Time spent:  8260 Minutes  Ron ParkerJENKINS,Princetta Uplinger C [facility] Pager [phone]   If 7AM -7PM Please Contact the Day Rounding Team MD for [facility]  If 7PM-7AM, Please Contact night-coverage  [email or web address] Password [email or web address] 02/04/2014, 9:35 PM

## 2014-02-04 NOTE — Consult Note (Signed)
Reason for Consult: Central cord syndrome Referring Physician: Emergency department  Gary Davis is an 58 y.o. male.  HPI: Patient is a 58 year old gentleman who presents with progressive worsening numbness and tingling in his hands and his lower legs and feet. He says he initially presented to the ER back in April complaining of numbness in her single arm I was told he had a pinched nerve was scheduled outpatient followup however the patient never did follow up with his primary care doctor or his outpatient referral. Since then had approximately regards noticing numbness in both hands and weakness in both hands in addition over the last month and a half he said numbness is in the lower part of his legs and his feet and reported to falls last night. He is still ambulatory he denies any bowel bladder complaints. He feels the numbness is gone the little worse recently but overall is exam is been stable now for approximately a month and a half to 2 months. He has not worked since March his previous occupation was as a Training and development officer. He denies keeping regular medical followup with primary care doctor denies any other known past medical history he does report smoking about a pack every 2 days.  Past Medical History  Diagnosis Date  . Arthritis   . Cataract     Past Surgical History  Procedure Laterality Date  . Arthroscopic repair acl Right   . Ankle surgery Right     History reviewed. No pertinent family history.  Social History:  reports that he has been smoking Cigarettes.  He has been smoking about 0.33 packs per day. He does not have any smokeless tobacco history on file. He reports that he drinks alcohol. He reports that he does not use illicit drugs.  Allergies: No Known Allergies  Medications: I have reviewed the patient's current medications.  Results for orders placed during the hospital encounter of 02/04/14 (from the past 48 hour(s))  CBC WITH DIFFERENTIAL     Status: Abnormal   Collection Time    02/04/14  3:49 PM      Result Value Ref Range   WBC 4.5  4.0 - 10.5 K/uL   RBC 4.39  4.22 - 5.81 MIL/uL   Hemoglobin 15.2  13.0 - 17.0 g/dL   HCT 42.6  39.0 - 52.0 %   MCV 97.0  78.0 - 100.0 fL   MCH 34.6 (*) 26.0 - 34.0 pg   MCHC 35.7  30.0 - 36.0 g/dL   RDW 12.5  11.5 - 15.5 %   Platelets 139 (*) 150 - 400 K/uL   Neutrophils Relative % 56  43 - 77 %   Neutro Abs 2.5  1.7 - 7.7 K/uL   Lymphocytes Relative 31  12 - 46 %   Lymphs Abs 1.4  0.7 - 4.0 K/uL   Monocytes Relative 12  3 - 12 %   Monocytes Absolute 0.6  0.1 - 1.0 K/uL   Eosinophils Relative 1  0 - 5 %   Eosinophils Absolute 0.1  0.0 - 0.7 K/uL   Basophils Relative 0  0 - 1 %   Basophils Absolute 0.0  0.0 - 0.1 K/uL  COMPREHENSIVE METABOLIC PANEL     Status: Abnormal   Collection Time    02/04/14  3:49 PM      Result Value Ref Range   Sodium 138  137 - 147 mEq/L   Potassium 4.1  3.7 - 5.3 mEq/L   Chloride 98  96 -  112 mEq/L   CO2 23  19 - 32 mEq/L   Glucose, Bld 103 (*) 70 - 99 mg/dL   BUN 18  6 - 23 mg/dL   Creatinine, Ser 0.84  0.50 - 1.35 mg/dL   Calcium 9.1  8.4 - 10.5 mg/dL   Total Protein 7.7  6.0 - 8.3 g/dL   Albumin 4.3  3.5 - 5.2 g/dL   AST 26  0 - 37 U/L   ALT 12  0 - 53 U/L   Alkaline Phosphatase 81  39 - 117 U/L   Total Bilirubin 0.9  0.3 - 1.2 mg/dL   GFR calc non Af Amer >90  >90 mL/min   GFR calc Af Amer >90  >90 mL/min   Comment: (NOTE)     The eGFR has been calculated using the CKD EPI equation.     This calculation has not been validated in all clinical situations.     eGFR's persistently <90 mL/min signify possible Chronic Kidney     Disease.   Anion gap 17 (*) 5 - 15  CBG MONITORING, ED     Status: Abnormal   Collection Time    02/04/14  4:56 PM      Result Value Ref Range   Glucose-Capillary 102 (*) 70 - 99 mg/dL   Comment 1 Documented in Chart     Comment 2 Notify RN      Ct Head Wo Contrast  02/04/2014   CLINICAL DATA:  Pain numbness.  Recent fall.  EXAM: CT  HEAD WITHOUT CONTRAST  TECHNIQUE: Contiguous axial images were obtained from the base of the skull through the vertex without intravenous contrast.  COMPARISON:  10/07/2013.  FINDINGS: No mass. No hydrocephalus. No hemorrhage. No acute bony abnormality. Visualized paranasal sinuses are clear. Postsurgical changes right maxillary sinus.  IMPRESSION: No acute abnormality.   Electronically Signed   By: Marcello Moores  Register   On: 02/04/2014 17:01   Mr Cervical Spine W Wo Contrast  02/04/2014   CLINICAL DATA:  58 year old male with bilateral hand pain numbness and Severe weakness. Initial encounter.  EXAM: MRI CERVICAL SPINE WITHOUT AND WITH CONTRAST  TECHNIQUE: Multiplanar and multiecho pulse sequences of the cervical spine, to include the craniocervical junction and cervicothoracic junction, were obtained according to standard protocol without and with intravenous contrast.  CONTRAST:  41m MULTIHANCE GADOBENATE DIMEGLUMINE 529 MG/ML IV SOLN  COMPARISON:  Head CT without contrast 1628 hr the same day and earlier.  FINDINGS: Straightening of cervical lordosis. Multilevel degenerative endplate marrow signal changes. Mild marrow edema and enhancement involving the posterior inferior C4 vertebra, appears to be degenerative in nature. No acute osseous abnormality suspected.  No other abnormal enhancement identified.  Cervicomedullary junction is within normal limits.  Severe multifactorial, degenerative spinal stenosis associated with Severe spinal cord compression from C3-C4 to C5-C6. Associated abnormal spinal cord signal at these levels most apparent at the C4 and C5 levels (series 300, image 7 and series 500, image 6).  Normal visualized upper thoracic spinal cord. See additional degenerative details below.  Negative paraspinal soft tissues.  There is a degree of congenital spinal canal narrowing in the cervical spine diffusely.  C2-C3: Mild overall spinal stenosis with mild broad-based disc protrusion. Mild to moderate  bilateral C3 foraminal stenosis.  C3-C4: Severe spinal stenosis with thecal sac reduced to 4 mm. This is related to bulky circumferential disc osteophyte complex and ligament flavum hypertrophy superimposed on congenital canal narrowing. Severe bilateral foraminal stenosis.  C4-C5: Severe spinal  stenosis, multifactorial with circumferential disc osteophyte complex, broad-based posterior component of disc. Thecal sac reduced to 4 mm. Severe bilateral foraminal stenosis.  C5-C6: Moderate to severe spinal stenosis. Disc osteophyte complex with broad-based posterior disc and ligament flavum hypertrophy superimposed on congenital canal narrowing. Thecal sac reduced to 5-6 mm. Severe bilateral foraminal stenosis.  C6-C7: Circumferential disc osteophyte complex. Mild spinal stenosis with minimal spinal cord mass effect. Moderate bilateral foraminal stenosis.  C7-T1: Moderate facet and uncovertebral hypertrophy on the left. Mild to moderate left C8 foraminal stenosis.  No upper thoracic spinal stenosis.  IMPRESSION: 1. Multilevel cervical spinal cord compression due to severe combined degenerative and congenital stenosis. 2. The level C3-C4 through C5-C6 are affected, with spinal cord signal abnormality which probably represents a combination of myelomalacia and edema in this clinical setting. 3. Additional degenerative widespread cervical neural foraminal stenosis, mostly severe. Critical Value/emergent results were called by telephone at the time of interpretation on 02/04/2014 at 7:20 pm to Dr. Shirlyn Goltz , who verbally acknowledged these results.   Electronically Signed   By: Lars Pinks M.D.   On: 02/04/2014 19:22    Review of Systems  Constitutional: Negative.   Eyes: Negative.   Respiratory: Negative.   Cardiovascular: Negative.   Gastrointestinal: Negative.   Genitourinary: Negative.   Musculoskeletal: Positive for back pain, falls, joint pain, myalgias and neck pain.  Skin: Negative.   Neurological: Positive  for tingling and sensory change.  Endo/Heme/Allergies: Negative.   Psychiatric/Behavioral: Negative.    Blood pressure 130/80, pulse 64, temperature 98.1 F (36.7 C), resp. rate 21, SpO2 95.00%. Physical Exam  Constitutional: He is oriented to person, place, and time. He appears well-developed and well-nourished.  HENT:  Head: Normocephalic.  Eyes: Pupils are equal, round, and reactive to light.  Respiratory: Breath sounds normal.  GI: Soft. Bowel sounds are normal.  Neurological: He is alert and oriented to person, place, and time. GCS eye subscore is 4. GCS verbal subscore is 5. GCS motor subscore is 6. He displays Babinski's sign on the right side. He displays Babinski's sign on the left side.  Reflex Scores:      Tricep reflexes are 2+ on the right side and 2+ on the left side.      Bicep reflexes are 2+ on the right side and 2+ on the left side.      Brachioradialis reflexes are 2+ on the right side and 2+ on the left side.      Patellar reflexes are 3+ on the right side and 3+ on the left side.      Achilles reflexes are 3+ on the right side and 3+ on the left side. The patient is awake alert strength in his upper extremities is 4+ out of 5 in his deltoids and biceps 4 minus out of 5 his triceps 3/5 in his hand intrinsics with contractures and atrophy lower extremity strength appears to be 5 out of 5 reflexes are brisk with clonus and upgoing toes    Assessment/Plan: 58 year old with a central cord syndrome an MRI scan showing cervical stenosis predominantly at C3-4, C4-5, C5-6. I recommend we initiate IV Decadron we admit the patient for complete medical workup and stabilization for preoperative clearance. We'll keep the patient on IV Decadron for a few days to treat the cervical spinal cord edema and plan operative decompression either through posterior anterior approach after a few days of treatment with IV steroids and preoperative clearance. We'll also recommending getting physical  outpatient therapy and  placing the patient in a cervical collar  Lan Entsminger P 02/04/2014, 8:32 PM

## 2014-02-04 NOTE — ED Provider Notes (Signed)
8:00 PM Patient except in signout from Dr. Silverio LayYao. Patient came in for bilateral numbness and weakness of the upper extremities along with lower extremity numbness. Found to have central cord compression in the cervical spine. He is feeling seen by neurology and Dr. Wynetta Emeryram from neurosurgery, who asks for hospital admission by internal medicine, he needs several days of steroids which Dr. Wynetta Emerycram will order, and workup for her surgical clearance this patient has no primary care. Current daily smoker   8:45 PM BP 130/80  Pulse 64  Temp(Src) 98.1 F (36.7 C)  Resp 21  SpO2 95% Accepted for admission by Dr. Lovell SheehanJenkins The patient appears reasonably stabilized for admission considering the current resources, flow, and capabilities available in the ED at this time, and I doubt any other [facility] requiring further screening and/or treatment in the ED prior to admission.    Arthor CaptainAbigail Demetria Lightsey, PA-C 02/04/14 2237

## 2014-02-04 NOTE — Consult Note (Signed)
Neurology Consultation Reason for Consult: Bilateral hand weakness Referring Physician: Silverio LayYao, D  CC: Weakness  History is obtained from: Patient  HPI: Gary Davis is a 58 y.o. male with a history of gradually progressive weakness and numbness of bilateral arms and more recently legs as well. He states that over the past few months since approximately April, he has had progressive weakness of bilateral hands causing him to not be able to cook.  He also has noticed problems with his gait, worsening over the past couple of months as well as numbness of the medial aspect of bilateral feet.  He denies bowel incontinence, states sometimes it is difficult for him to fully empty his bladder.   ROS: A 14 point ROS was performed and is negative except as noted in the HPI.   Past Medical History  Diagnosis Date  . Arthritis   . Cataract     Family History: No history of nerve problems  Social History: Tob: Current smoker  Exam: Current vital signs: BP 123/72  Pulse 50  Temp(Src) 98.1 F (36.7 C)  Resp 16  SpO2 98% Vital signs in last 24 hours: Temp:  [98.1 F (36.7 C)] 98.1 F (36.7 C) (08/05 1233) Pulse Rate:  [45-95] 50 (08/05 1757) Resp:  [16-18] 16 (08/05 1757) BP: (102-124)/(72-90) 123/72 mmHg (08/05 1757) SpO2:  [97 %-99 %] 98 % (08/05 1757)  General: In bed, NAD CV: Regular rate and rhythm Mental Status: Patient is awake, alert, oriented to person, place, month, year, and situation. Immediate and remote memory are intact. Patient is able to give a clear and coherent history. No signs of aphasia or neglect Cranial Nerves: II: Visual Fields are full. Pupils are equal, round, and reactive to light.  Discs are difficult to visualize. III,IV, VI: EOMI without ptosis or diploplia.  V: Facial sensation is symmetric to temperature VII: Facial movement is symmetric.  VIII: hearing is intact to voice X: Uvula elevates symmetrically XI: Shoulder shrug is  symmetric. XII: tongue is midline without atrophy or fasciculations.  Motor: Tone is increased in bilateral legs. He has atrophy of bilateral hands, 3/5 weakness of wrist extension, 4 minus/5 interossei, 4 minus/5 biceps on the right, 4/5 biceps on the left. He has some hip flexion weakness as well. Sensory: Sensation is diminished in approximate C8 distribution on the left, the entire right hand feels numb on the right, medial aspect of his feet bilaterally have decreased light touch. Deep Tendon Reflexes: 3+ and symmetric in the biceps and patellae, 2+ in the ankles bilaterally.  Plantars: Toes are downgoing bilaterally.  Cerebellar: He has difficulty with finger-nose-finger bilaterally consistent with his degree of weakness Gait: Not tested due to multiple medical monitors in ED setting.   I have reviewed labs in epic and the results pertinent to this consultation are:  CMP-unremarkable I have reviewed the images obtained: CT head-unremarkable  Impression: 58 year old male with bilateral arm numbness and weakness and progressive gait abnormality. This constellation of symptoms is strongly suggestive of cervical spine pathology.  Recommendations: 1) MRI C-spine with/without contrast 2) if compressive lesion or syrinx is seen, which consult neurosurgery 3) if no pathology on cervical spine MRI to explain his symptoms, then I would favor an EMG as the next diagnostic test. He  would need to have this done as an outpatient.   Ritta SlotMcNeill Keisy Strickler, MD [facility] [phone]  If 7pm- 7am, please page neurology on call as listed in AMION.

## 2014-02-04 NOTE — ED Provider Notes (Signed)
CSN: [id number]     Arrival date & time 02/04/14  1220 History   First MD Initiated Contact with Patient 02/04/14 1448     Chief Complaint  Patient presents with  . Knee Pain  . Hand Pain  . Numbness   HPI  Gary Davis is a 58 year old gentleman with a history of osteoarthritis, who presents with a one and a half month history of numbness and weakness in his bilateral hands and bilateral lower extremities below the level of the knee.  He reports that he was previously evaluated in the emergency department in April of 2015 for similar symptoms that occurred in bilateral arms. After a negative acute workup, he was referred to neurology for outpatient followup. However, due to insurance purposes, he was not able to followup with the specialist. He did report resolution of symptoms in his arms soon after evaluation.  Month and a half ago, he reports that he started having recurrent symptoms in bilateral hands and bilateral lower extremities below the knees. He says that the numbness in his hands have gotten to the point where he can no longer hot handles of his pots in the kitchen. He has been burning himself frequently because of this. He also has been dropping many of the objects that he tries to hold because of weakness in his hands. Additionally, last night, he had 2 falls that were unprovoked. He said that his legs simply gave out on him, denies any loss of consciousness or hitting anything from the fall.    Past Medical History  Diagnosis Date  . Arthritis   . Cataract    Past Surgical History  Procedure Laterality Date  . Arthroscopic repair acl Right   . Ankle surgery Right    History reviewed. No pertinent family history. History  Substance Use Topics  . Smoking status: Current Every Day Smoker -- 0.33 packs/day    Types: Cigarettes  . Smokeless tobacco: Not on file  . Alcohol Use: Yes    Review of Systems  Constitutional: Negative for fever and chills.  HENT:  Negative for hearing loss.   Eyes: Positive for visual disturbance.  Respiratory: Negative for cough, chest tightness and shortness of breath.   Cardiovascular: Negative for chest pain and palpitations.  Gastrointestinal: Negative for nausea, vomiting, abdominal pain and diarrhea.  Neurological: Positive for weakness and numbness.  All other systems reviewed and are negative.     Allergies  Review of patient's allergies indicates no known allergies.  Home Medications   Prior to Admission medications   Medication Sig Start Date End Date Taking? Authorizing Provider  gabapentin (NEURONTIN) 100 MG capsule Take 1 capsule (100 mg total) by mouth 3 (three) times daily. 10/07/13  Yes Audree CamelScott T Goldston, MD  ibuprofen (ADVIL,MOTRIN) 200 MG tablet Take 400 mg by mouth every 6 (six) hours as needed for moderate pain.    Yes Historical Provider, MD   BP 123/72  Pulse 50  Temp(Src) 98.1 F (36.7 C)  Resp 16  SpO2 98% Physical Exam  General: In mild distress, secondary to pain, thin African American male HEENT: Oropharynx without exudates, atraumatic Cardiovascular: Regular rate rhythm, no murmurs rubs or gallops Respiratory: Clear to auscultation bilaterally, no wheezes or rales Abdominal: Soft, nontender, nondistended Extremities: No edema Neuro Cranial nerves: Cranial nerves II through XII intact Strength: Grip strength 3/5 bilaterally, biceps 4/5 bilaterally, triceps 3/5 bilaterally, deltoids 4/5 bilaterally, hip flexors 3/5 bilaterally, ankle flexion and extension 4/5 bilaterally Sensation: Markedly decreased sensation to  pinprick and light touch in bilateral hands and bilateral legs below the level of the knees Reflexes: 2+ throughout Cerebellar: dysmetria with finger to nose  ED Course  Procedures (including critical care time) Labs Review Labs Reviewed  CBC WITH DIFFERENTIAL - Abnormal; Notable for the following:    MCH 34.6 (*)    Platelets 139 (*)    All other components  within normal limits  COMPREHENSIVE METABOLIC PANEL - Abnormal; Notable for the following:    Glucose, Bld 103 (*)    Anion gap 17 (*)    All other components within normal limits  CBG MONITORING, ED - Abnormal; Notable for the following:    Glucose-Capillary 102 (*)    All other components within normal limits    Imaging Review Ct Head Wo Contrast  02/04/2014   CLINICAL DATA:  Pain numbness.  Recent fall.  EXAM: CT HEAD WITHOUT CONTRAST  TECHNIQUE: Contiguous axial images were obtained from the base of the skull through the vertex without intravenous contrast.  COMPARISON:  10/07/2013.  FINDINGS: No mass. No hydrocephalus. No hemorrhage. No acute bony abnormality. Visualized paranasal sinuses are clear. Postsurgical changes right maxillary sinus.  IMPRESSION: No acute abnormality.   Electronically Signed   By: Maisie Fus  Register   On: 02/04/2014 17:01     EKG Interpretation None      MDM   Final diagnoses:  None   Bilateral numbness and weakness in hands and lower extremities below the knees 1-1/2 months in duration and interfering with several ADLs at home.  - Neurology recommends evaluating for cervical spine pathology with MR cervical spine with and without contrast.  - Cervical spine MR pending.  - Patient signed out to Dr. Silverio Lay.    Harold Barban, MD 02/04/14 [phone]

## 2014-02-04 NOTE — Progress Notes (Signed)
Pt transferred to unit from ED. Pt oriented to room and made as comfortable as possible. No signs or symptoms of acute distress. Pt resting in bed at lowest position, with call light within reach. Will continue to monitor. Delfino Lovettichie Sharlett Lienemann, RN, BSN 02/04/2014 10:41 PM

## 2014-02-05 ENCOUNTER — Other Ambulatory Visit: Payer: Self-pay | Admitting: Neurosurgery

## 2014-02-05 LAB — BASIC METABOLIC PANEL
ANION GAP: 14 (ref 5–15)
BUN: 14 mg/dL (ref 6–23)
CHLORIDE: 99 meq/L (ref 96–112)
CO2: 24 mEq/L (ref 19–32)
CREATININE: 0.69 mg/dL (ref 0.50–1.35)
Calcium: 8.7 mg/dL (ref 8.4–10.5)
GFR calc non Af Amer: >90 mL/min (ref >90)
Glucose, Bld: 176 mg/dL — ABNORMAL HIGH (ref 70–99)
Potassium: 4 mEq/L (ref 3.7–5.3)
Sodium: 137 mEq/L (ref 137–147)

## 2014-02-05 LAB — CBC
HCT: 40.9 % (ref 39.0–52.0)
HEMOGLOBIN: 14.2 g/dL (ref 13.0–17.0)
MCH: 33.9 pg (ref 26.0–34.0)
MCHC: 34.7 g/dL (ref 30.0–36.0)
MCV: 97.6 fL (ref 78.0–100.0)
Platelets: 137 10*3/uL — ABNORMAL LOW (ref 150–400)
RBC: 4.19 MIL/uL — ABNORMAL LOW (ref 4.22–5.81)
RDW: 12.2 % (ref 11.5–15.5)
WBC: 2.3 10*3/uL — ABNORMAL LOW (ref 4.0–10.5)

## 2014-02-05 MED ORDER — ZOLPIDEM TARTRATE 5 MG PO TABS
5.0000 mg | ORAL_TABLET | Freq: Every evening | ORAL | Status: DC | PRN
  Administered 2014-02-05 – 2014-02-10 (×6): 5 mg via ORAL
  Filled 2014-02-05 (×7): qty 1

## 2014-02-05 MED ORDER — ZOLPIDEM TARTRATE 5 MG PO TABS
5.0000 mg | ORAL_TABLET | Freq: Once | ORAL | Status: AC
  Administered 2014-02-05: 5 mg via ORAL
  Filled 2014-02-05: qty 1

## 2014-02-05 NOTE — Progress Notes (Signed)
MRI reviewed with stenosis with cord signal change. I feel that this is the cause of his problems. Neurosurgery has been consulted.   Patient is awake, pleasant. Continues to have significant upper extremity weakness.    I will defer surgical management to neurosurgery at this time. No further testing or recommendations, please call with any further questions.   Ritta SlotMcNeill Gary Nicolson, MD [facility] [phone]  If 7pm- 7am, please page neurology on call as listed in AMION.

## 2014-02-05 NOTE — Progress Notes (Signed)
Utilization review completed. Jigar Zielke, RN, BSN. 

## 2014-02-05 NOTE — ED Provider Notes (Signed)
Medical screening examination/treatment/procedure(s) were conducted as a shared visit with non-physician practitioner(s) and myself.  I personally evaluated the patient during the encounter.   EKG Interpretation None      See my separate note   Richardean Canalavid H Yao, MD 02/05/14 [phone]

## 2014-02-05 NOTE — ED Provider Notes (Signed)
I saw and evaluated the patient, reviewed the resident's note and I agree with the findings and plan.   EKG Interpretation None      Gary Davis is a 58 y.o. male hx of arthritis here with numbness, weakness. He works as a Investment banker, operational and has progressive weakness of his hands for 1.5 months. Also weakness bilateral legs as well. Associated with numbness and tingling in hands and feet. Came several months ago and had nl CT head and labs and neuro referral. Never saw neuro. Felt weak and fell yesterday. On exam, dec hand grasp, around 2/5. Shoulder adduction around 3/5. Hip flexion 3/5 bilaterally. Lower extremity strength 3/5 bilaterally. ? Dysmetria. Neuro consulted and recommend MRI cervical spine. MRI showed C3-5 stenosis with cord compression. She likely has central cord syndrome. I called neurosurgery, Dr. Wynetta Emery, who recommend steroids and medical admission.   Results for orders placed during the hospital encounter of 02/04/14  CBC WITH DIFFERENTIAL      Result Value Ref Range   WBC 4.5  4.0 - 10.5 K/uL   RBC 4.39  4.22 - 5.81 MIL/uL   Hemoglobin 15.2  13.0 - 17.0 g/dL   HCT 16.1  09.6 - 04.5 %   MCV 97.0  78.0 - 100.0 fL   MCH 34.6 (*) 26.0 - 34.0 pg   MCHC 35.7  30.0 - 36.0 g/dL   RDW 40.9  81.1 - 91.4 %   Platelets 139 (*) 150 - 400 K/uL   Neutrophils Relative % 56  43 - 77 %   Neutro Abs 2.5  1.7 - 7.7 K/uL   Lymphocytes Relative 31  12 - 46 %   Lymphs Abs 1.4  0.7 - 4.0 K/uL   Monocytes Relative 12  3 - 12 %   Monocytes Absolute 0.6  0.1 - 1.0 K/uL   Eosinophils Relative 1  0 - 5 %   Eosinophils Absolute 0.1  0.0 - 0.7 K/uL   Basophils Relative 0  0 - 1 %   Basophils Absolute 0.0  0.0 - 0.1 K/uL  COMPREHENSIVE METABOLIC PANEL      Result Value Ref Range   Sodium 138  137 - 147 mEq/L   Potassium 4.1  3.7 - 5.3 mEq/L   Chloride 98  96 - 112 mEq/L   CO2 23  19 - 32 mEq/L   Glucose, Bld 103 (*) 70 - 99 mg/dL   BUN 18  6 - 23 mg/dL   Creatinine, Ser 7.82  0.50 - 1.35 mg/dL    Calcium 9.1  8.4 - 95.6 mg/dL   Total Protein 7.7  6.0 - 8.3 g/dL   Albumin 4.3  3.5 - 5.2 g/dL   AST 26  0 - 37 U/L   ALT 12  0 - 53 U/L   Alkaline Phosphatase 81  39 - 117 U/L   Total Bilirubin 0.9  0.3 - 1.2 mg/dL   GFR calc non Af Amer >90  >90 mL/min   GFR calc Af Amer >90  >90 mL/min   Anion gap 17 (*) 5 - 15  PROTIME-INR      Result Value Ref Range   Prothrombin Time 13.9  11.6 - 15.2 seconds   INR 1.07  0.00 - 1.49  APTT      Result Value Ref Range   aPTT 28  24 - 37 seconds  BASIC METABOLIC PANEL      Result Value Ref Range   Sodium 137  137 - 147  mEq/L   Potassium 4.0  3.7 - 5.3 mEq/L   Chloride 99  96 - 112 mEq/L   CO2 24  19 - 32 mEq/L   Glucose, Bld 176 (*) 70 - 99 mg/dL   BUN 14  6 - 23 mg/dL   Creatinine, Ser 1.610.69  0.50 - 1.35 mg/dL   Calcium 8.7  8.4 - 09.610.5 mg/dL   GFR calc non Af Amer >90  >90 mL/min   GFR calc Af Amer >90  >90 mL/min   Anion gap 14  5 - 15  CBC      Result Value Ref Range   WBC 2.3 (*) 4.0 - 10.5 K/uL   RBC 4.19 (*) 4.22 - 5.81 MIL/uL   Hemoglobin 14.2  13.0 - 17.0 g/dL   HCT 04.540.9  40.939.0 - 81.152.0 %   MCV 97.6  78.0 - 100.0 fL   MCH 33.9  26.0 - 34.0 pg   MCHC 34.7  30.0 - 36.0 g/dL   RDW 91.412.2  78.211.5 - 95.615.5 %   Platelets 137 (*) 150 - 400 K/uL  CBG MONITORING, ED      Result Value Ref Range   Glucose-Capillary 102 (*) 70 - 99 mg/dL   Comment 1 Documented in Chart     Comment 2 Notify RN     Ct Head Wo Contrast  02/04/2014   CLINICAL DATA:  Pain numbness.  Recent fall.  EXAM: CT HEAD WITHOUT CONTRAST  TECHNIQUE: Contiguous axial images were obtained from the base of the skull through the vertex without intravenous contrast.  COMPARISON:  10/07/2013.  FINDINGS: No mass. No hydrocephalus. No hemorrhage. No acute bony abnormality. Visualized paranasal sinuses are clear. Postsurgical changes right maxillary sinus.  IMPRESSION: No acute abnormality.   Electronically Signed   By: Maisie Fushomas  Register   On: 02/04/2014 17:01   Mr Cervical Spine W  Wo Contrast  02/04/2014   CLINICAL DATA:  58 year old male with bilateral hand pain numbness and Severe weakness. Initial encounter.  EXAM: MRI CERVICAL SPINE WITHOUT AND WITH CONTRAST  TECHNIQUE: Multiplanar and multiecho pulse sequences of the cervical spine, to include the craniocervical junction and cervicothoracic junction, were obtained according to standard protocol without and with intravenous contrast.  CONTRAST:  17mL MULTIHANCE GADOBENATE DIMEGLUMINE 529 MG/ML IV SOLN  COMPARISON:  Head CT without contrast 1628 hr the same day and earlier.  FINDINGS: Straightening of cervical lordosis. Multilevel degenerative endplate marrow signal changes. Mild marrow edema and enhancement involving the posterior inferior C4 vertebra, appears to be degenerative in nature. No acute osseous abnormality suspected.  No other abnormal enhancement identified.  Cervicomedullary junction is within normal limits.  Severe multifactorial, degenerative spinal stenosis associated with Severe spinal cord compression from C3-C4 to C5-C6. Associated abnormal spinal cord signal at these levels most apparent at the C4 and C5 levels (series 300, image 7 and series 500, image 6).  Normal visualized upper thoracic spinal cord. See additional degenerative details below.  Negative paraspinal soft tissues.  There is a degree of congenital spinal canal narrowing in the cervical spine diffusely.  C2-C3: Mild overall spinal stenosis with mild broad-based disc protrusion. Mild to moderate bilateral C3 foraminal stenosis.  C3-C4: Severe spinal stenosis with thecal sac reduced to 4 mm. This is related to bulky circumferential disc osteophyte complex and ligament flavum hypertrophy superimposed on congenital canal narrowing. Severe bilateral foraminal stenosis.  C4-C5: Severe spinal stenosis, multifactorial with circumferential disc osteophyte complex, broad-based posterior component of disc. Thecal sac reduced to  4 mm. Severe bilateral foraminal  stenosis.  C5-C6: Moderate to severe spinal stenosis. Disc osteophyte complex with broad-based posterior disc and ligament flavum hypertrophy superimposed on congenital canal narrowing. Thecal sac reduced to 5-6 mm. Severe bilateral foraminal stenosis.  C6-C7: Circumferential disc osteophyte complex. Mild spinal stenosis with minimal spinal cord mass effect. Moderate bilateral foraminal stenosis.  C7-T1: Moderate facet and uncovertebral hypertrophy on the left. Mild to moderate left C8 foraminal stenosis.  No upper thoracic spinal stenosis.  IMPRESSION: 1. Multilevel cervical spinal cord compression due to severe combined degenerative and congenital stenosis. 2. The level C3-C4 through C5-C6 are affected, with spinal cord signal abnormality which probably represents a combination of myelomalacia and edema in this clinical setting. 3. Additional degenerative widespread cervical neural foraminal stenosis, mostly severe. Critical Value/emergent results were called by telephone at the time of interpretation on 02/04/2014 at 7:20 pm to Dr. Chaney Malling , who verbally acknowledged these results.   Electronically Signed   By: Augusto Gamble M.D.   On: 02/04/2014 19:22      CRITICAL CARE Performed by: Silverio Lay, Jolissa Kapral   Total critical care time: 30 min   Critical care time was exclusive of separately billable procedures and treating other patients.  Critical care was necessary to treat or prevent imminent or life-threatening deterioration.  Critical care was time spent personally by me on the following activities: development of treatment plan with patient and/or surrogate as well as nursing, discussions with consultants, evaluation of patient's response to treatment, examination of patient, obtaining history from patient or surrogate, ordering and performing treatments and interventions, ordering and review of laboratory studies, ordering and review of radiographic studies, pulse oximetry and re-evaluation of patient's  condition.    Richardean Canal, MD 02/05/14 [phone]

## 2014-02-05 NOTE — Progress Notes (Signed)
Pt c/o of hunger and requesting for something to eat. MD called and notified; new order for regular diet and pt to be NPO after midnight received. Dionne BucyP. Amo Zach Tietje RN

## 2014-02-05 NOTE — Progress Notes (Signed)
Subjective: Patient reports Doing well no change in arms and legs still feels morning sees her heart her forearm do more soreness lumbar stiffness  Objective: Vital signs in last 24 hours: Temp:  [98.1 F (36.7 C)-98.2 F (36.8 C)] 98.2 F (36.8 C) (08/06 0458) Pulse Rate:  [25-95] 53 (08/06 0458) Resp:  [14-21] 16 (08/06 0458) BP: (102-150)/(71-99) 129/82 mmHg (08/06 0458) SpO2:  [88 %-100 %] 95 % (08/06 0458) Weight:  [71.986 kg (158 lb 11.2 oz)] 71.986 kg (158 lb 11.2 oz) (08/05 2147)  Intake/Output from previous day: 08/05 0701 - 08/06 0700 In: -  Out: 250 [Urine:250] Intake/Output this shift:    Neurologic exam unchanged still 3 of 5 hand intrinsics 4 minus out of 5 triceps lower Sherry strength is 4+ to 5 out of 5 slightly more apraxic in the right lower extremity  Lab Results:  Recent Labs  02/04/14 1549  WBC 4.5  HGB 15.2  HCT 42.6  PLT 139*   BMET  Recent Labs  02/04/14 1549  NA 138  K 4.1  CL 98  CO2 23  GLUCOSE 103*  BUN 18  CREATININE 0.84  CALCIUM 9.1    Studies/Results: Ct Head Wo Contrast  02/04/2014   CLINICAL DATA:  Pain numbness.  Recent fall.  EXAM: CT HEAD WITHOUT CONTRAST  TECHNIQUE: Contiguous axial images were obtained from the base of the skull through the vertex without intravenous contrast.  COMPARISON:  10/07/2013.  FINDINGS: No mass. No hydrocephalus. No hemorrhage. No acute bony abnormality. Visualized paranasal sinuses are clear. Postsurgical changes right maxillary sinus.  IMPRESSION: No acute abnormality.   Electronically Signed   By: Maisie Fus  Register   On: 02/04/2014 17:01   Mr Cervical Spine W Wo Contrast  02/04/2014   CLINICAL DATA:  58 year old male with bilateral hand pain numbness and Severe weakness. Initial encounter.  EXAM: MRI CERVICAL SPINE WITHOUT AND WITH CONTRAST  TECHNIQUE: Multiplanar and multiecho pulse sequences of the cervical spine, to include the craniocervical junction and cervicothoracic junction, were obtained  according to standard protocol without and with intravenous contrast.  CONTRAST:  17mL MULTIHANCE GADOBENATE DIMEGLUMINE 529 MG/ML IV SOLN  COMPARISON:  Head CT without contrast 1628 hr the same day and earlier.  FINDINGS: Straightening of cervical lordosis. Multilevel degenerative endplate marrow signal changes. Mild marrow edema and enhancement involving the posterior inferior C4 vertebra, appears to be degenerative in nature. No acute osseous abnormality suspected.  No other abnormal enhancement identified.  Cervicomedullary junction is within normal limits.  Severe multifactorial, degenerative spinal stenosis associated with Severe spinal cord compression from C3-C4 to C5-C6. Associated abnormal spinal cord signal at these levels most apparent at the C4 and C5 levels (series 300, image 7 and series 500, image 6).  Normal visualized upper thoracic spinal cord. See additional degenerative details below.  Negative paraspinal soft tissues.  There is a degree of congenital spinal canal narrowing in the cervical spine diffusely.  C2-C3: Mild overall spinal stenosis with mild broad-based disc protrusion. Mild to moderate bilateral C3 foraminal stenosis.  C3-C4: Severe spinal stenosis with thecal sac reduced to 4 mm. This is related to bulky circumferential disc osteophyte complex and ligament flavum hypertrophy superimposed on congenital canal narrowing. Severe bilateral foraminal stenosis.  C4-C5: Severe spinal stenosis, multifactorial with circumferential disc osteophyte complex, broad-based posterior component of disc. Thecal sac reduced to 4 mm. Severe bilateral foraminal stenosis.  C5-C6: Moderate to severe spinal stenosis. Disc osteophyte complex with broad-based posterior disc and ligament flavum hypertrophy superimposed  on congenital canal narrowing. Thecal sac reduced to 5-6 mm. Severe bilateral foraminal stenosis.  C6-C7: Circumferential disc osteophyte complex. Mild spinal stenosis with minimal spinal cord  mass effect. Moderate bilateral foraminal stenosis.  C7-T1: Moderate facet and uncovertebral hypertrophy on the left. Mild to moderate left C8 foraminal stenosis.  No upper thoracic spinal stenosis.  IMPRESSION: 1. Multilevel cervical spinal cord compression due to severe combined degenerative and congenital stenosis. 2. The level C3-C4 through C5-C6 are affected, with spinal cord signal abnormality which probably represents a combination of myelomalacia and edema in this clinical setting. 3. Additional degenerative widespread cervical neural foraminal stenosis, mostly severe. Critical Value/emergent results were called by telephone at the time of interpretation on 02/04/2014 at 7:20 pm to Dr. Chaney MallingAVID YAO , who verbally acknowledged these results.   Electronically Signed   By: Augusto GambleLee  Hall M.D.   On: 02/04/2014 19:22    Assessment/Plan: Continued IV Decadron workup medically for preoperative clearance plan surgical decompression  LOS: 1 day     Rachelanne Whidby P 02/05/2014, 7:13 AM

## 2014-02-05 NOTE — Progress Notes (Signed)
[facility] PROGRESS NOTE  Assessment/Plan: Bilateral arm weakness due to Cord compression syndrome - on IV decadron. Neurosurgery on board. - PT after surgery.  Preop evaluation: - Patient low to moderate risk with a moderate risk procedure. He can ambulate more than a block without getting short of breath. More than 4 metabolic equivalents. I reviewed the risk and benefits of surgery and he has agreed. - Proceed with surgery no further tests are necessary - Surgery can take over quantity of the procedure.  Tobacco use disorder: - counseling.  Arthritis - cont pain controlled.    Code Status: FULL CODE  Family Communication: No Family Present  Disposition Plan: Inpatient     Consultants: neurosurgery Procedures: Laminectomy with decompression  Antibiotics:  nonedicate start date, and stop date if known)  HPI/Subjective: wakness about the same.  Objective: Filed Vitals:   02/04/14 2100 02/04/14 2147 02/05/14 0205 02/05/14 0458  BP: 136/77 150/99 130/73 129/82  Pulse: 71 77 55 53  Temp:  98.1 F (36.7 C) 98.2 F (36.8 C) 98.2 F (36.8 C)  TempSrc:  Oral Oral Oral  Resp: 14 16 16 16   Height:  6\' 1"  (1.854 m)    Weight:  71.986 kg (158 lb 11.2 oz)    SpO2: 88% 100% 96% 95%    Intake/Output Summary (Last 24 hours) at 02/05/14 0741 Last data filed at 02/05/14 0729  Gross per 24 hour  Intake      0 ml  Output    500 ml  Net   -500 ml   Filed Weights   02/04/14 2147  Weight: 71.986 kg (158 lb 11.2 oz)    Exam:  General: Alert, awake, oriented x3, in no acute distress.  HEENT: No bruits, no goiter.  Heart: Regular rate and rhythm. Lungs: Good air movement, clear Abdomen: Soft, nontender, nondistended, positive bowel sounds.     Data Reviewed: Basic Metabolic Panel:  Recent Labs Lab 02/04/14 1549  NA 138  K 4.1  CL 98  CO2 23  GLUCOSE 103*  BUN 18  CREATININE 0.84  CALCIUM 9.1   Liver Function Tests:  Recent Labs Lab  02/04/14 1549  AST 26  ALT 12  ALKPHOS 81  BILITOT 0.9  PROT 7.7  ALBUMIN 4.3   No results found for this basename: LIPASE, AMYLASE,  in the last 168 hours No results found for this basename: AMMONIA,  in the last 168 hours CBC:  Recent Labs Lab 02/04/14 1549  WBC 4.5  NEUTROABS 2.5  HGB 15.2  HCT 42.6  MCV 97.0  PLT 139*   Cardiac Enzymes: No results found for this basename: CKTOTAL, CKMB, CKMBINDEX, TROPONINI,  in the last 168 hours BNP (last 3 results) No results found for this basename: PROBNP,  in the last 8760 hours CBG:  Recent Labs Lab 02/04/14 1656  GLUCAP 102*    No results found for this or any previous visit (from the past 240 hour(s)).   Studies: Ct Head Wo Contrast  02/04/2014   CLINICAL DATA:  Pain numbness.  Recent fall.  EXAM: CT HEAD WITHOUT CONTRAST  TECHNIQUE: Contiguous axial images were obtained from the base of the skull through the vertex without intravenous contrast.  COMPARISON:  10/07/2013.  FINDINGS: No mass. No hydrocephalus. No hemorrhage. No acute bony abnormality. Visualized paranasal sinuses are clear. Postsurgical changes right maxillary sinus.  IMPRESSION: No acute abnormality.   Electronically Signed   By: Maisie Fushomas  Register   On: 02/04/2014 17:01   Mr  Cervical Spine W Wo Contrast  02/04/2014   CLINICAL DATA:  59 year old male with bilateral hand pain numbness and Severe weakness. Initial encounter.  EXAM: MRI CERVICAL SPINE WITHOUT AND WITH CONTRAST  TECHNIQUE: Multiplanar and multiecho pulse sequences of the cervical spine, to include the craniocervical junction and cervicothoracic junction, were obtained according to standard protocol without and with intravenous contrast.  CONTRAST:  17mL MULTIHANCE GADOBENATE DIMEGLUMINE 529 MG/ML IV SOLN  COMPARISON:  Head CT without contrast 1628 hr the same day and earlier.  FINDINGS: Straightening of cervical lordosis. Multilevel degenerative endplate marrow signal changes. Mild marrow edema and  enhancement involving the posterior inferior C4 vertebra, appears to be degenerative in nature. No acute osseous abnormality suspected.  No other abnormal enhancement identified.  Cervicomedullary junction is within normal limits.  Severe multifactorial, degenerative spinal stenosis associated with Severe spinal cord compression from C3-C4 to C5-C6. Associated abnormal spinal cord signal at these levels most apparent at the C4 and C5 levels (series 300, image 7 and series 500, image 6).  Normal visualized upper thoracic spinal cord. See additional degenerative details below.  Negative paraspinal soft tissues.  There is a degree of congenital spinal canal narrowing in the cervical spine diffusely.  C2-C3: Mild overall spinal stenosis with mild broad-based disc protrusion. Mild to moderate bilateral C3 foraminal stenosis.  C3-C4: Severe spinal stenosis with thecal sac reduced to 4 mm. This is related to bulky circumferential disc osteophyte complex and ligament flavum hypertrophy superimposed on congenital canal narrowing. Severe bilateral foraminal stenosis.  C4-C5: Severe spinal stenosis, multifactorial with circumferential disc osteophyte complex, broad-based posterior component of disc. Thecal sac reduced to 4 mm. Severe bilateral foraminal stenosis.  C5-C6: Moderate to severe spinal stenosis. Disc osteophyte complex with broad-based posterior disc and ligament flavum hypertrophy superimposed on congenital canal narrowing. Thecal sac reduced to 5-6 mm. Severe bilateral foraminal stenosis.  C6-C7: Circumferential disc osteophyte complex. Mild spinal stenosis with minimal spinal cord mass effect. Moderate bilateral foraminal stenosis.  C7-T1: Moderate facet and uncovertebral hypertrophy on the left. Mild to moderate left C8 foraminal stenosis.  No upper thoracic spinal stenosis.  IMPRESSION: 1. Multilevel cervical spinal cord compression due to severe combined degenerative and congenital stenosis. 2. The level C3-C4  through C5-C6 are affected, with spinal cord signal abnormality which probably represents a combination of myelomalacia and edema in this clinical setting. 3. Additional degenerative widespread cervical neural foraminal stenosis, mostly severe. Critical Value/emergent results were called by telephone at the time of interpretation on 02/04/2014 at 7:20 pm to Dr. Chaney Malling , who verbally acknowledged these results.   Electronically Signed   By: Augusto Gamble M.D.   On: 02/04/2014 19:22    Scheduled Meds: . dexamethasone  10 mg Intravenous 4 times per day  . gabapentin  100 mg Oral TID  . nicotine  7 mg Transdermal Daily  . pantoprazole (PROTONIX) IV  40 mg Intravenous Q12H   Continuous Infusions: . sodium chloride 100 mL/hr at 02/04/14 2214     Marinda Elk  [facility] Pager [phone]. If 8PM-8AM, please contact night-coverage at [email or web address], password [facility] 02/05/2014, 7:41 AM  LOS: 1 day      **Disclaimer: This note may have been dictated with voice recognition software. Similar sounding words can inadvertently be transcribed and this note may contain transcription errors which may not have been corrected upon publication of note.**

## 2014-02-06 MED ORDER — PANTOPRAZOLE SODIUM 40 MG PO TBEC
40.0000 mg | DELAYED_RELEASE_TABLET | Freq: Two times a day (BID) | ORAL | Status: DC
  Administered 2014-02-06 – 2014-02-11 (×11): 40 mg via ORAL
  Filled 2014-02-06 (×10): qty 1

## 2014-02-06 MED ORDER — DEXAMETHASONE SODIUM PHOSPHATE 10 MG/ML IJ SOLN
4.0000 mg | Freq: Four times a day (QID) | INTRAMUSCULAR | Status: AC
  Administered 2014-02-06 – 2014-02-07 (×6): 4 mg via INTRAVENOUS
  Filled 2014-02-06 (×6): qty 1

## 2014-02-06 MED ORDER — OXYCODONE HCL 5 MG PO TABS
5.0000 mg | ORAL_TABLET | ORAL | Status: DC | PRN
  Administered 2014-02-06 (×2): 10 mg via ORAL
  Administered 2014-02-06: 5 mg via ORAL
  Administered 2014-02-06 (×2): 10 mg via ORAL
  Administered 2014-02-07: 5 mg via ORAL
  Administered 2014-02-07 (×2): 10 mg via ORAL
  Administered 2014-02-07: 5 mg via ORAL
  Administered 2014-02-07 – 2014-02-08 (×3): 10 mg via ORAL
  Filled 2014-02-06 (×7): qty 2
  Filled 2014-02-06: qty 1
  Filled 2014-02-06 (×5): qty 2

## 2014-02-06 NOTE — Progress Notes (Addendum)
[facility] PROGRESS NOTE  Assessment/Plan: Bilateral arm weakness due to Cord compression syndrome - on IV decadron. Neurosurgery on board. - PT after surgery.  Preop evaluation: - Patient low to moderate risk with a moderate risk procedure. Can exercise 4 metabolic equivalents. I reviewed the risk and benefits of surgery and he has agreed. - Proceed with surgery no further tests are necessary - Surgery can take over the day of the procedure.  Tobacco use disorder: - counseling.  Arthritis - cont pain controlled.    Code Status: FULL CODE  Family Communication: No Family Present  Disposition Plan: Inpatient     Consultants: neurosurgery Procedures: Laminectomy with decompression  Antibiotics:  none  HPI/Subjective: weakness about the same.  Objective: Filed Vitals:   02/05/14 1753 02/05/14 2149 02/06/14 0149 02/06/14 0705  BP: 132/77 120/74 117/64 143/91  Pulse: 55 51 53 67  Temp: 97.6 F (36.4 C) 97.8 F (36.6 C) 97.6 F (36.4 C) 98 F (36.7 C)  TempSrc: Oral Oral Oral Oral  Resp: 16 16 18 18   Height:      Weight:      SpO2: 98% 93% 96% 99%    Intake/Output Summary (Last 24 hours) at 02/06/14 0729 Last data filed at 02/05/14 2151  Gross per 24 hour  Intake      0 ml  Output    950 ml  Net   -950 ml   Filed Weights   02/04/14 2147  Weight: 71.986 kg (158 lb 11.2 oz)    Exam:  General: Alert, awake, oriented x3, in no acute distress.  HEENT: No bruits, no goiter.  Heart: Regular rate and rhythm. Lungs: Good air movement, clear Abdomen: Soft, nontender, nondistended, positive bowel sounds.     Data Reviewed: Basic Metabolic Panel:  Recent Labs Lab 02/04/14 1549 02/05/14 0721  NA 138 137  K 4.1 4.0  CL 98 99  CO2 23 24  GLUCOSE 103* 176*  BUN 18 14  CREATININE 0.84 0.69  CALCIUM 9.1 8.7   Liver Function Tests:  Recent Labs Lab 02/04/14 1549  AST 26  ALT 12  ALKPHOS 81  BILITOT 0.9  PROT 7.7  ALBUMIN 4.3    No results found for this basename: LIPASE, AMYLASE,  in the last 168 hours No results found for this basename: AMMONIA,  in the last 168 hours CBC:  Recent Labs Lab 02/04/14 1549 02/05/14 0721  WBC 4.5 2.3*  NEUTROABS 2.5  --   HGB 15.2 14.2  HCT 42.6 40.9  MCV 97.0 97.6  PLT 139* 137*   Cardiac Enzymes: No results found for this basename: CKTOTAL, CKMB, CKMBINDEX, TROPONINI,  in the last 168 hours BNP (last 3 results) No results found for this basename: PROBNP,  in the last 8760 hours CBG:  Recent Labs Lab 02/04/14 1656  GLUCAP 102*    No results found for this or any previous visit (from the past 240 hour(s)).   Studies: Ct Head Wo Contrast  02/04/2014   CLINICAL DATA:  Pain numbness.  Recent fall.  EXAM: CT HEAD WITHOUT CONTRAST  TECHNIQUE: Contiguous axial images were obtained from the base of the skull through the vertex without intravenous contrast.  COMPARISON:  10/07/2013.  FINDINGS: No mass. No hydrocephalus. No hemorrhage. No acute bony abnormality. Visualized paranasal sinuses are clear. Postsurgical changes right maxillary sinus.  IMPRESSION: No acute abnormality.   Electronically Signed   By: Maisie Fushomas  Register   On: 02/04/2014 17:01   Mr Cervical Spine W  Wo Contrast  02/04/2014   CLINICAL DATA:  58 year old male with bilateral hand pain numbness and Severe weakness. Initial encounter.  EXAM: MRI CERVICAL SPINE WITHOUT AND WITH CONTRAST  TECHNIQUE: Multiplanar and multiecho pulse sequences of the cervical spine, to include the craniocervical junction and cervicothoracic junction, were obtained according to standard protocol without and with intravenous contrast.  CONTRAST:  17mL MULTIHANCE GADOBENATE DIMEGLUMINE 529 MG/ML IV SOLN  COMPARISON:  Head CT without contrast 1628 hr the same day and earlier.  FINDINGS: Straightening of cervical lordosis. Multilevel degenerative endplate marrow signal changes. Mild marrow edema and enhancement involving the posterior inferior  C4 vertebra, appears to be degenerative in nature. No acute osseous abnormality suspected.  No other abnormal enhancement identified.  Cervicomedullary junction is within normal limits.  Severe multifactorial, degenerative spinal stenosis associated with Severe spinal cord compression from C3-C4 to C5-C6. Associated abnormal spinal cord signal at these levels most apparent at the C4 and C5 levels (series 300, image 7 and series 500, image 6).  Normal visualized upper thoracic spinal cord. See additional degenerative details below.  Negative paraspinal soft tissues.  There is a degree of congenital spinal canal narrowing in the cervical spine diffusely.  C2-C3: Mild overall spinal stenosis with mild broad-based disc protrusion. Mild to moderate bilateral C3 foraminal stenosis.  C3-C4: Severe spinal stenosis with thecal sac reduced to 4 mm. This is related to bulky circumferential disc osteophyte complex and ligament flavum hypertrophy superimposed on congenital canal narrowing. Severe bilateral foraminal stenosis.  C4-C5: Severe spinal stenosis, multifactorial with circumferential disc osteophyte complex, broad-based posterior component of disc. Thecal sac reduced to 4 mm. Severe bilateral foraminal stenosis.  C5-C6: Moderate to severe spinal stenosis. Disc osteophyte complex with broad-based posterior disc and ligament flavum hypertrophy superimposed on congenital canal narrowing. Thecal sac reduced to 5-6 mm. Severe bilateral foraminal stenosis.  C6-C7: Circumferential disc osteophyte complex. Mild spinal stenosis with minimal spinal cord mass effect. Moderate bilateral foraminal stenosis.  C7-T1: Moderate facet and uncovertebral hypertrophy on the left. Mild to moderate left C8 foraminal stenosis.  No upper thoracic spinal stenosis.  IMPRESSION: 1. Multilevel cervical spinal cord compression due to severe combined degenerative and congenital stenosis. 2. The level C3-C4 through C5-C6 are affected, with spinal cord  signal abnormality which probably represents a combination of myelomalacia and edema in this clinical setting. 3. Additional degenerative widespread cervical neural foraminal stenosis, mostly severe. Critical Value/emergent results were called by telephone at the time of interpretation on 02/04/2014 at 7:20 pm to Dr. Chaney Malling , who verbally acknowledged these results.   Electronically Signed   By: Augusto Gamble M.D.   On: 02/04/2014 19:22    Scheduled Meds: . dexamethasone  10 mg Intravenous 4 times per day  . gabapentin  100 mg Oral TID  . nicotine  7 mg Transdermal Daily  . pantoprazole (PROTONIX) IV  40 mg Intravenous Q12H   Continuous Infusions: . sodium chloride 100 mL/hr at 02/06/14 0654     Marinda Elk  [facility] Pager [phone]. If 8PM-8AM, please contact night-coverage at [email or web address], password [facility] 02/06/2014, 7:29 AM  LOS: 2 days      **Disclaimer: This note may have been dictated with voice recognition software. Similar sounding words can inadvertently be transcribed and this note may contain transcription errors which may not have been corrected upon publication of note.**

## 2014-02-06 NOTE — Progress Notes (Signed)
Patient ID: Gary Davis, male   DOB: 02/18/1956, 58 y.o.   MRN: [id number] Gary LabradorWe'll continue to stabilize patient on steroids plan surgical decompression stabilization on Monday.

## 2014-02-06 NOTE — Evaluation (Signed)
Physical Therapy Evaluation Patient Details Name: Gary RidingMichael D Bowns MRN: [id number] DOB: 07/15/1955 Today's Date: 02/06/2014   History of Present Illness  Patient is a 58 yo male admitted 02/04/14 with numbness and weakness in both hands.  MRI - Multilevel cervical spinal cord compression due to severe combined degenerative and congenital stenosis.  The level C3-C4 through C5-C6 are affected.  Per neurosurgery note, patient for surgery on Monday, 02/09/14.  PMH:  HTN, arthritis.  Clinical Impression  Patient presents with problems listed below.  Patient to have surgery on Monday to address spinal cord compression.  Encouraged patient to ambulate and move OOB over weekend with nursing.  Recommend f/u PT reassessment following surgery to address d/c plan.  MD:  Please re-order PT evaluation post-op.  Thank you.    Follow Up Recommendations Supervision/Assistance - 24 hour (TBD following surgery)    Equipment Recommendations  Rolling walker with 5" wheels    Recommendations for Other Services       Precautions / Restrictions Precautions Precautions: Fall Precaution Comments: Patient reports 2 falls pta - knees "gave away" Required Braces or Orthoses: Cervical Brace Cervical Brace: Hard collar;At all times Restrictions Weight Bearing Restrictions: No      Mobility  Bed Mobility Overal bed mobility: Modified Independent                Transfers Overall transfer level: Modified independent Equipment used: None                Ambulation/Gait Ambulation/Gait assistance: Supervision Ambulation Distance (Feet): 24 Feet Assistive device: None (Pushes IV pole for support) Gait Pattern/deviations: Step-through pattern;Decreased step length - right;Decreased step length - left;Decreased stride length;Shuffle Gait velocity: Decreased Gait velocity interpretation: Below normal speed for age/gender General Gait Details: Patient with slow, shuffling gait.  Unsteady, holding IV  pole for support.  Stairs            Wheelchair Mobility    Modified Rankin (Stroke Patients Only)       Balance                                             Pertinent Vitals/Pain Pain Assessment: Faces Faces Pain Scale: Hurts a little bit Pain Location: Lower legs and feet Pain Descriptors / Indicators: Pins and needles Pain Intervention(s): Repositioned    Home Living Family/patient expects to be discharged to:: Private residence Living Arrangements: Alone Available Help at Discharge: Family;Available PRN/intermittently Type of Home: Apartment Home Access: Level entry     Home Layout: One level Home Equipment: None      Prior Function Level of Independence: Independent;Needs assistance   Gait / Transfers Assistance Needed: Patient holding onto furniture and walls for ambulation.  ADL's / Homemaking Assistance Needed: Difficulty with cooking due to decreased sensation/strength.  Did burn his hands several times.        Hand Dominance        Extremity/Trunk Assessment   Upper Extremity Assessment: RUE deficits/detail;LUE deficits/detail RUE Deficits / Details: Shoulder 3+/5; elbow flex 3/5; wrist/hand at 2/5   RUE Sensation: decreased light touch;decreased proprioception LUE Deficits / Details: Shoulder 3+/5; elbow flex 3/5; wrist/hand at 2/5   Lower Extremity Assessment: Generalized weakness;LLE deficits/detail;RLE deficits/detail RLE Deficits / Details: Knee extension grossly 4/5; DF/PF at 4/5 LLE Deficits / Details: Knee extension grossly 4/5; DF/PF at 4/5     Communication  Communication: No difficulties  Cognition Arousal/Alertness: Awake/alert Behavior During Therapy: WFL for tasks assessed/performed Overall Cognitive Status: Within Functional Limits for tasks assessed                      General Comments      Exercises        Assessment/Plan    PT Assessment Patient needs continued PT services  (following surgery)  PT Diagnosis Difficulty walking;Abnormality of gait   PT Problem List Decreased strength;Decreased activity tolerance;Decreased balance;Decreased mobility;Decreased coordination;Decreased knowledge of use of DME;Impaired sensation;Pain  PT Treatment Interventions DME instruction;Gait training;Functional mobility training;Balance training;Therapeutic activities;Patient/family education   PT Goals (Current goals can be found in the Care Plan section) Acute Rehab PT Goals Patient Stated Goal: To get stronger    Frequency Other (Comment) (Will need to f/u with PT after surgery)   Barriers to discharge Decreased caregiver support Patient lives alone    Co-evaluation               End of Session Equipment Utilized During Treatment: Gait belt;Cervical collar Activity Tolerance: Patient limited by fatigue Patient left: in bed;with call bell/phone within reach Nurse Communication: Mobility status         Time: 1610-9604 PT Time Calculation (min): 20 min   Charges:   PT Evaluation $Initial PT Evaluation Tier I: 1 Procedure PT Treatments $Gait Training: 8-22 mins   PT G Codes:          Vena Austria 02/06/2014, 1:37 PM Durenda Hurt. Renaldo Fiddler, [facility] Acute Rehab Services Pager [phone]

## 2014-02-06 NOTE — Evaluation (Signed)
Occupational Therapy Evaluation/ Discharge Patient Details Name: Gary RidingMichael D Colello MRN: [id number] DOB: 07/28/1955 Today's Date: 02/06/2014    History of Present Illness Patient is a 58 yo male admitted 02/04/14 with numbness and weakness in both hands.  MRI - Multilevel cervical spinal cord compression due to severe combined degenerative and congenital stenosis.  The level C3-C4 through C5-C6 are affected.  Per neurosurgery note, patient for surgery on Monday, 02/09/14.  PMH:  HTN, arthritis.   Clinical Impression   PT admitted with s/p fall with Cord compression pending surgery. Pt currently with functional limitiations due to the deficits listed below (see OT problem list). PTA independent Pt will benefit from skilled OT to increase their independence and safety with adls and balance to allow discharge TBA. Awaiting surgery. Recommend outpatient for BIL UE      Follow Up Recommendations  Outpatient OT    Equipment Recommendations  None recommended by OT    Recommendations for Other Services       Precautions / Restrictions Precautions Precautions: Fall Precaution Comments: 2 falls Required Braces or Orthoses: Cervical Brace Cervical Brace: Hard collar;At all times Restrictions Weight Bearing Restrictions: No      Mobility Bed Mobility Overal bed mobility: Modified Independent                Transfers Overall transfer level: Modified independent Equipment used: None                  Balance Overall balance assessment: Needs assistance         Standing balance support: During functional activity;No upper extremity supported Standing balance-Leahy Scale: Fair                              ADL Overall ADL's : Needs assistance/impaired     Grooming: Wash/dry hands;Min guard;Standing Grooming Details (indicate cue type and reason): ataxic UE movement                 Toilet Transfer: Minimal assistance;Ambulation;Regular Toilet            Functional mobility during ADLs: Minimal assistance General ADL Comments: Pt typing on cell phone on arrival. pt with most movement with thumbs. pt with weak lateral pinch. Pt requires extended time to use cell phone.      Vision                     Perception     Praxis      Pertinent Vitals/Pain Pain Assessment: 0-10 Pain Score: 5  Faces Pain Scale: Hurts a little bit Pain Location: Lower legs and feet Pain Descriptors / Indicators: Pins and needles Pain Intervention(s): Repositioned     Hand Dominance Right   Extremity/Trunk Assessment Upper Extremity Assessment Upper Extremity Assessment: RUE deficits/detail;LUE deficits/detail RUE Deficits / Details: shoulder flexion 3+ out 5 AROM 80 degrees, digits decr grasp 3 out 5. Pt unable to fully extend digits. pt demonstrates claw hand (ataxic movement) RUE Sensation: decreased light touch RUE Coordination: decreased fine motor LUE Deficits / Details: shoulder flexion 3+ out 5 AROM 80 degrees, digits decr grasp 3 out 5. Pt unable to fully extend digits. pt demonstrates claw hand LUE Sensation: decreased light touch LUE Coordination: decreased fine motor   Lower Extremity Assessment Lower Extremity Assessment: Defer to PT evaluation RLE Deficits / Details: Knee extension grossly 4/5; DF/PF at 4/5 RLE Sensation: decreased light touch (below knees) RLE Coordination: decreased  gross motor LLE Deficits / Details: Knee extension grossly 4/5; DF/PF at 4/5 LLE Sensation: decreased light touch (below knees) LLE Coordination: decreased gross motor   Cervical / Trunk Assessment Cervical / Trunk Assessment: Other exceptions (awaiting surg )   Communication Communication Communication: No difficulties   Cognition Arousal/Alertness: Awake/alert Behavior During Therapy: WFL for tasks assessed/performed Overall Cognitive Status: Impaired/Different from baseline Area of Impairment: Awareness           Awareness:  Anticipatory   General Comments: Pt without ccollar on . pt removed ccollar and states "its itchy"    General Comments       Exercises       Shoulder Instructions      Home Living Family/patient expects to be discharged to:: Private residence Living Arrangements: Alone Available Help at Discharge: Family;Available PRN/intermittently Type of Home: Apartment Home Access: Level entry     Home Layout: One level     Bathroom Shower/Tub: Chief Strategy Officer: Standard     Home Equipment: None          Prior Functioning/Environment Level of Independence: Needs assistance  Gait / Transfers Assistance Needed: Patient holding onto furniture and walls for ambulation. ADL's / Homemaking Assistance Needed: Difficulty with cooking due to decreased sensation/strength.  Did burn his hands several times.        OT Diagnosis:     OT Problem List:     OT Treatment/Interventions:      OT Goals(Current goals can be found in the care plan section) Acute Rehab OT Goals Patient Stated Goal: To get stronger  OT Frequency:     Barriers to D/C:            Co-evaluation              End of Session Nurse Communication: Mobility status  Activity Tolerance: Patient tolerated treatment well Patient left: in chair;with call bell/phone within reach   Time: 1610-9604 OT Time Calculation (min): 12 min Charges:  OT General Charges $OT Visit: 1 Procedure OT Evaluation $Initial OT Evaluation Tier I: 1 Procedure OT Treatments $Self Care/Home Management : 8-22 mins G-Codes:    Harolyn Rutherford 2014/02/17, 3:29 PM Pager: [phone]

## 2014-02-06 NOTE — Progress Notes (Signed)
Patient ID: Debbra RidingMichael D Mounsey, male   DOB: 04/28/1956, 58 y.o.   MRN: [id number] Patient is doing well improvement in numbness and tingling and burning in his hands he is ambulating we plan on decompression and fusion for posterior approach on Monday morning. X-rays reviewed the risks and benefits of the operation the patient as well as perioperative course and expectations about alternatives of surgery and stands and agrees to proceed forward a recommend maintaining on Decadron throughout the weekend I would reduce his dose

## 2014-02-07 NOTE — Progress Notes (Signed)
[facility] PROGRESS NOTE  Assessment/Plan: Bilateral arm weakness due to Cord compression syndrome - on IV decadron. Neurosurgery on board. - PT after surgery. - surgery on Monday. - Surgery can take over the day of the procedure.  Preop evaluation: - Patient low to moderate risk with a moderate risk procedure. Can exercise 4 metabolic equivalents. I reviewed the risk and benefits of surgery and he has agreed. - Proceed with surgery no further tests are necessary   Tobacco use disorder: - counseling.  Arthritis - cont pain controlled.    Code Status: FULL CODE  Family Communication: No Family Present  Disposition Plan: Inpatient     Consultants: Neurosurgery  Procedures: Laminectomy with decompression 8.10.2015  Antibiotics:  none  HPI/Subjective: Weakness about the same.  Objective: Filed Vitals:   02/06/14 1738 02/06/14 2106 02/07/14 0146 02/07/14 0545  BP: 139/74 154/87 135/69 168/74  Pulse: 56 54 46 44  Temp: 97.7 F (36.5 C) 97.9 F (36.6 C) 98.4 F (36.9 C) 97.9 F (36.6 C)  TempSrc: Oral Oral Oral Oral  Resp: 20 20 18 18   Height:      Weight:      SpO2: 100% 98% 95% 95%   No intake or output data in the 24 hours ending 02/07/14 0918 Filed Weights   02/04/14 2147  Weight: 71.986 kg (158 lb 11.2 oz)    Exam:  General: Alert, awake, oriented x3, in no acute distress.  HEENT: No bruits, no goiter.  Heart: Regular rate and rhythm. Lungs: Good air movement, clear Abdomen: Soft, nontender, nondistended, positive bowel sounds.     Data Reviewed: Basic Metabolic Panel:  Recent Labs Lab 02/04/14 1549 02/05/14 0721  NA 138 137  K 4.1 4.0  CL 98 99  CO2 23 24  GLUCOSE 103* 176*  BUN 18 14  CREATININE 0.84 0.69  CALCIUM 9.1 8.7   Liver Function Tests:  Recent Labs Lab 02/04/14 1549  AST 26  ALT 12  ALKPHOS 81  BILITOT 0.9  PROT 7.7  ALBUMIN 4.3   No results found for this basename: LIPASE, AMYLASE,  in the last  168 hours No results found for this basename: AMMONIA,  in the last 168 hours CBC:  Recent Labs Lab 02/04/14 1549 02/05/14 0721  WBC 4.5 2.3*  NEUTROABS 2.5  --   HGB 15.2 14.2  HCT 42.6 40.9  MCV 97.0 97.6  PLT 139* 137*   Cardiac Enzymes: No results found for this basename: CKTOTAL, CKMB, CKMBINDEX, TROPONINI,  in the last 168 hours BNP (last 3 results) No results found for this basename: PROBNP,  in the last 8760 hours CBG:  Recent Labs Lab 02/04/14 1656  GLUCAP 102*    No results found for this or any previous visit (from the past 240 hour(s)).   Studies: No results found.  Scheduled Meds: . dexamethasone  4 mg Intravenous 4 times per day  . gabapentin  100 mg Oral TID  . nicotine  7 mg Transdermal Daily  . pantoprazole  40 mg Oral BID   Continuous Infusions: . sodium chloride 100 mL/hr at 02/06/14 1610     Gary Davis, Gary Davis  [facility] Pager [phone]. If 8PM-8AM, please contact night-coverage at [email or web address], password [email or web address] 02/07/2014, 9:18 AM  LOS: 3 days      **Disclaimer: This note may have been dictated with voice recognition software. Similar sounding words can inadvertently be transcribed and this note may contain transcription errors which may not have been corrected upon publication  of note.**

## 2014-02-07 NOTE — Progress Notes (Signed)
Patient ID: Gary RidingMichael D Davis, male   DOB: 10/11/1955, 58 y.o.   MRN: [id number] Afeb, vss No new neuro issues. For posterior cervical decompression Monday morning. He understands the plan and wants to proceed. Plan for surgery Monday by Dr Wynetta Emeryram

## 2014-02-08 MED ORDER — MAGNESIUM HYDROXIDE 400 MG/5ML PO SUSP
30.0000 mL | Freq: Every day | ORAL | Status: DC | PRN
  Administered 2014-02-08: 30 mL via ORAL
  Filled 2014-02-08: qty 30

## 2014-02-08 MED ORDER — POLYETHYLENE GLYCOL 3350 17 G PO PACK
17.0000 g | PACK | Freq: Three times a day (TID) | ORAL | Status: AC
  Administered 2014-02-08 – 2014-02-09 (×2): 17 g via ORAL
  Filled 2014-02-08 (×3): qty 1

## 2014-02-08 MED ORDER — OXYCODONE HCL 5 MG PO TABS
15.0000 mg | ORAL_TABLET | ORAL | Status: DC | PRN
  Administered 2014-02-08 – 2014-02-11 (×18): 15 mg via ORAL
  Filled 2014-02-08 (×18): qty 3

## 2014-02-08 NOTE — Progress Notes (Signed)
Patient ID: Gary Davis, male   DOB: 05/15/1956, 58 y.o.   MRN: [id number] Subjective:  The patient is alert and pleasant. He complains of neck pain.  Objective: Vital signs in last 24 hours: Temp:  [98 F (36.7 C)-98.4 F (36.9 C)] 98.1 F (36.7 C) (08/09 0953) Pulse Rate:  [46-53] 52 (08/09 0953) Resp:  [16-18] 18 (08/09 0953) BP: (129-163)/(75-84) 131/80 mmHg (08/09 0953) SpO2:  [93 %-100 %] 100 % (08/09 0953)  Intake/Output from previous day:   Intake/Output this shift:    Physical exam the patient is moving all 4 extremities.  Lab Results: No results found for this basename: WBC, HGB, HCT, PLT,  in the last 72 hours BMET No results found for this basename: NA, K, CL, CO2, GLUCOSE, BUN, CREATININE, CALCIUM,  in the last 72 hours  Studies/Results: No results found.  Assessment/Plan: Cervical myelopathy: The patient is scheduled for surgery tomorrow. I have answered all his questions.  LOS: 4 days     Isolde Skaff D 02/08/2014, 10:35 AM

## 2014-02-09 ENCOUNTER — Inpatient Hospital Stay (HOSPITAL_COMMUNITY): Payer: No Typology Code available for payment source

## 2014-02-09 ENCOUNTER — Encounter (HOSPITAL_COMMUNITY): Payer: No Typology Code available for payment source | Admitting: Certified Registered"

## 2014-02-09 ENCOUNTER — Inpatient Hospital Stay (HOSPITAL_COMMUNITY): Payer: No Typology Code available for payment source | Admitting: Certified Registered"

## 2014-02-09 ENCOUNTER — Encounter (HOSPITAL_COMMUNITY): Payer: Self-pay | Admitting: Certified Registered"

## 2014-02-09 ENCOUNTER — Encounter (HOSPITAL_COMMUNITY)
Admission: EM | Disposition: A | Discharge status: Other Acute Inpatient Hospital | Payer: Self-pay | Source: Home / Self Care | Attending: Internal Medicine

## 2014-02-09 DIAGNOSIS — M4712 Other spondylosis with myelopathy, cervical region: Secondary | ICD-10-CM | POA: Diagnosis present

## 2014-02-09 HISTORY — PX: POSTERIOR CERVICAL FUSION/FORAMINOTOMY: SHX5038

## 2014-02-09 LAB — SURGICAL PCR SCREEN
MRSA, PCR: NEGATIVE
Staphylococcus aureus: NEGATIVE

## 2014-02-09 SURGERY — POSTERIOR CERVICAL FUSION/FORAMINOTOMY LEVEL 4
Anesthesia: General

## 2014-02-09 MED ORDER — OXYCODONE HCL 5 MG PO TABS
ORAL_TABLET | ORAL | Status: AC
  Administered 2014-02-09: 11:00:00
  Filled 2014-02-09: qty 1

## 2014-02-09 MED ORDER — ARTIFICIAL TEARS OP OINT
TOPICAL_OINTMENT | OPHTHALMIC | Status: DC | PRN
  Administered 2014-02-09: 1 via OPHTHALMIC

## 2014-02-09 MED ORDER — ONDANSETRON HCL 4 MG/2ML IJ SOLN
INTRAMUSCULAR | Status: DC | PRN
  Administered 2014-02-09: 4 mg via INTRAVENOUS

## 2014-02-09 MED ORDER — DEXAMETHASONE SODIUM PHOSPHATE 10 MG/ML IJ SOLN
INTRAMUSCULAR | Status: DC | PRN
  Administered 2014-02-09: 10 mg via INTRAVENOUS

## 2014-02-09 MED ORDER — SODIUM CHLORIDE 0.9 % IJ SOLN: 3.0000 mL | INTRAMUSCULAR | Status: DC | PRN

## 2014-02-09 MED ORDER — FENTANYL CITRATE 0.05 MG/ML IJ SOLN
INTRAMUSCULAR | Status: AC
  Filled 2014-02-09: qty 5

## 2014-02-09 MED ORDER — PHENYLEPHRINE 40 MCG/ML (10ML) SYRINGE FOR IV PUSH (FOR BLOOD PRESSURE SUPPORT)
PREFILLED_SYRINGE | INTRAVENOUS | Status: AC
  Filled 2014-02-09: qty 10

## 2014-02-09 MED ORDER — WHITE PETROLATUM GEL
Status: AC
  Administered 2014-02-09: 07:00:00
  Filled 2014-02-09: qty 5

## 2014-02-09 MED ORDER — OXYCODONE-ACETAMINOPHEN 5-325 MG PO TABS
1.0000 | ORAL_TABLET | ORAL | Status: DC | PRN
  Administered 2014-02-11: 2 via ORAL
  Filled 2014-02-09: qty 2

## 2014-02-09 MED ORDER — ONDANSETRON HCL 4 MG/2ML IJ SOLN
INTRAMUSCULAR | Status: AC
  Filled 2014-02-09: qty 2

## 2014-02-09 MED ORDER — DEXAMETHASONE SODIUM PHOSPHATE 10 MG/ML IJ SOLN
INTRAMUSCULAR | Status: AC
  Filled 2014-02-09: qty 1

## 2014-02-09 MED ORDER — STERILE WATER FOR INJECTION IJ SOLN
INTRAMUSCULAR | Status: AC
  Filled 2014-02-09: qty 10

## 2014-02-09 MED ORDER — SODIUM CHLORIDE 0.9 % IJ SOLN
INTRAMUSCULAR | Status: AC
  Filled 2014-02-09: qty 10

## 2014-02-09 MED ORDER — LIDOCAINE HCL (CARDIAC) 20 MG/ML IV SOLN
INTRAVENOUS | Status: AC
  Filled 2014-02-09: qty 5

## 2014-02-09 MED ORDER — VECURONIUM BROMIDE 10 MG IV SOLR
INTRAVENOUS | Status: AC
  Filled 2014-02-09: qty 10

## 2014-02-09 MED ORDER — LACTATED RINGERS IV SOLN
INTRAVENOUS | Status: DC | PRN
  Administered 2014-02-09 (×2): via INTRAVENOUS

## 2014-02-09 MED ORDER — NEOSTIGMINE METHYLSULFATE 10 MG/10ML IV SOLN
INTRAVENOUS | Status: AC
  Filled 2014-02-09: qty 1

## 2014-02-09 MED ORDER — MIDAZOLAM HCL 2 MG/2ML IJ SOLN: 0.5000 mg | Freq: Once | INTRAMUSCULAR | Status: DC | PRN

## 2014-02-09 MED ORDER — PROPOFOL 10 MG/ML IV BOLUS
INTRAVENOUS | Status: AC
  Filled 2014-02-09: qty 20

## 2014-02-09 MED ORDER — SODIUM CHLORIDE 0.9 % IJ SOLN
3.0000 mL | Freq: Two times a day (BID) | INTRAMUSCULAR | Status: DC
  Administered 2014-02-09 – 2014-02-11 (×4): 3 mL via INTRAVENOUS

## 2014-02-09 MED ORDER — SODIUM CHLORIDE 0.9 % IR SOLN
Status: DC | PRN
  Administered 2014-02-09: 09:00:00

## 2014-02-09 MED ORDER — SUCCINYLCHOLINE CHLORIDE 20 MG/ML IJ SOLN
INTRAMUSCULAR | Status: AC
  Filled 2014-02-09: qty 1

## 2014-02-09 MED ORDER — THROMBIN 20000 UNITS EX SOLR
CUTANEOUS | Status: DC | PRN
  Administered 2014-02-09: 09:00:00 via TOPICAL

## 2014-02-09 MED ORDER — CYCLOBENZAPRINE HCL 10 MG PO TABS
10.0000 mg | ORAL_TABLET | Freq: Three times a day (TID) | ORAL | Status: DC | PRN
  Administered 2014-02-09 – 2014-02-11 (×7): 10 mg via ORAL
  Filled 2014-02-09 (×6): qty 1

## 2014-02-09 MED ORDER — LIDOCAINE HCL (CARDIAC) 20 MG/ML IV SOLN
INTRAVENOUS | Status: DC | PRN
  Administered 2014-02-09: 30 mg via INTRAVENOUS

## 2014-02-09 MED ORDER — FENTANYL CITRATE 0.05 MG/ML IJ SOLN
INTRAMUSCULAR | Status: DC | PRN
  Administered 2014-02-09 (×3): 100 ug via INTRAVENOUS
  Administered 2014-02-09: 50 ug via INTRAVENOUS
  Administered 2014-02-09: 150 ug via INTRAVENOUS

## 2014-02-09 MED ORDER — POTASSIUM CHLORIDE IN NACL 20-0.45 MEQ/L-% IV SOLN
INTRAVENOUS | Status: DC
  Administered 2014-02-09: 14:00:00 via INTRAVENOUS
  Filled 2014-02-09 (×7): qty 1000

## 2014-02-09 MED ORDER — CEFAZOLIN SODIUM 1-5 GM-% IV SOLN
1.0000 g | Freq: Three times a day (TID) | INTRAVENOUS | Status: AC
  Administered 2014-02-09 (×2): 1 g via INTRAVENOUS
  Filled 2014-02-09 (×3): qty 50

## 2014-02-09 MED ORDER — DEXAMETHASONE SODIUM PHOSPHATE 4 MG/ML IJ SOLN
4.0000 mg | Freq: Four times a day (QID) | INTRAMUSCULAR | Status: DC
  Administered 2014-02-11: 4 mg via INTRAVENOUS
  Filled 2014-02-09: qty 1

## 2014-02-09 MED ORDER — GLYCOPYRROLATE 0.2 MG/ML IJ SOLN
INTRAMUSCULAR | Status: DC | PRN
  Administered 2014-02-09: 0.4 mg via INTRAVENOUS

## 2014-02-09 MED ORDER — SODIUM CHLORIDE 0.9 % IV SOLN: 250.0000 mL | INTRAVENOUS | Status: DC

## 2014-02-09 MED ORDER — ACETAMINOPHEN 650 MG RE SUPP: 650.0000 mg | RECTAL | Status: DC | PRN

## 2014-02-09 MED ORDER — DOCUSATE SODIUM 100 MG PO CAPS
100.0000 mg | ORAL_CAPSULE | Freq: Two times a day (BID) | ORAL | Status: DC
Start: 2014-02-09 — End: 2014-02-11
  Administered 2014-02-10 – 2014-02-11 (×2): 100 mg via ORAL
  Filled 2014-02-09 (×5): qty 1

## 2014-02-09 MED ORDER — BUPIVACAINE HCL (PF) 0.25 % IJ SOLN
INTRAMUSCULAR | Status: DC | PRN
  Administered 2014-02-09: 10 mL

## 2014-02-09 MED ORDER — NEOSTIGMINE METHYLSULFATE 10 MG/10ML IV SOLN
INTRAVENOUS | Status: DC | PRN
  Administered 2014-02-09: 3 mg via INTRAVENOUS

## 2014-02-09 MED ORDER — MIDAZOLAM HCL 2 MG/2ML IJ SOLN
INTRAMUSCULAR | Status: AC
  Filled 2014-02-09: qty 2

## 2014-02-09 MED ORDER — ROCURONIUM BROMIDE 50 MG/5ML IV SOLN
INTRAVENOUS | Status: AC
  Filled 2014-02-09: qty 1

## 2014-02-09 MED ORDER — ONDANSETRON HCL 4 MG/2ML IJ SOLN: 4.0000 mg | INTRAMUSCULAR | Status: DC | PRN

## 2014-02-09 MED ORDER — ARTIFICIAL TEARS OP OINT
TOPICAL_OINTMENT | OPHTHALMIC | Status: AC
  Filled 2014-02-09: qty 3.5

## 2014-02-09 MED ORDER — EPHEDRINE SULFATE 50 MG/ML IJ SOLN
INTRAMUSCULAR | Status: DC | PRN
  Administered 2014-02-09: 15 mg via INTRAVENOUS
  Administered 2014-02-09 (×2): 10 mg via INTRAVENOUS

## 2014-02-09 MED ORDER — MENTHOL 3 MG MT LOZG: 1.0000 | LOZENGE | OROMUCOSAL | Status: DC | PRN

## 2014-02-09 MED ORDER — ACETAMINOPHEN 325 MG PO TABS: 650.0000 mg | ORAL_TABLET | ORAL | Status: DC | PRN

## 2014-02-09 MED ORDER — ROCURONIUM BROMIDE 100 MG/10ML IV SOLN
INTRAVENOUS | Status: DC | PRN
  Administered 2014-02-09: 20 mg via INTRAVENOUS
  Administered 2014-02-09: 30 mg via INTRAVENOUS

## 2014-02-09 MED ORDER — PROPOFOL 10 MG/ML IV BOLUS
INTRAVENOUS | Status: DC | PRN
  Administered 2014-02-09: 150 mg via INTRAVENOUS
  Administered 2014-02-09: 50 mg via INTRAVENOUS

## 2014-02-09 MED ORDER — VECURONIUM BROMIDE 10 MG IV SOLR
INTRAVENOUS | Status: DC | PRN
  Administered 2014-02-09 (×3): 2 mg via INTRAVENOUS

## 2014-02-09 MED ORDER — 0.9 % SODIUM CHLORIDE (POUR BTL) OPTIME
TOPICAL | Status: DC | PRN
  Administered 2014-02-09: 1000 mL

## 2014-02-09 MED ORDER — HYDROMORPHONE HCL PF 1 MG/ML IJ SOLN
0.5000 mg | INTRAMUSCULAR | Status: DC | PRN
  Administered 2014-02-09 (×2): 0.5 mg via INTRAVENOUS

## 2014-02-09 MED ORDER — CEFAZOLIN SODIUM-DEXTROSE 2-3 GM-% IV SOLR
INTRAVENOUS | Status: AC
  Administered 2014-02-09: 2 g via INTRAVENOUS
  Filled 2014-02-09: qty 50

## 2014-02-09 MED ORDER — EPHEDRINE SULFATE 50 MG/ML IJ SOLN
INTRAMUSCULAR | Status: AC
  Filled 2014-02-09: qty 1

## 2014-02-09 MED ORDER — HYDROMORPHONE HCL PF 1 MG/ML IJ SOLN
INTRAMUSCULAR | Status: AC
  Administered 2014-02-09: 11:00:00
  Filled 2014-02-09: qty 1

## 2014-02-09 MED ORDER — MEPERIDINE HCL 25 MG/ML IJ SOLN: 6.2500 mg | INTRAMUSCULAR | Status: DC | PRN

## 2014-02-09 MED ORDER — SUCCINYLCHOLINE CHLORIDE 20 MG/ML IJ SOLN
INTRAMUSCULAR | Status: DC | PRN
  Administered 2014-02-09: 100 mg via INTRAVENOUS

## 2014-02-09 MED ORDER — HYDROMORPHONE HCL PF 1 MG/ML IJ SOLN
0.5000 mg | INTRAMUSCULAR | Status: DC | PRN
  Filled 2014-02-09 (×5): qty 1

## 2014-02-09 MED ORDER — CYCLOBENZAPRINE HCL 10 MG PO TABS
ORAL_TABLET | ORAL | Status: AC
  Administered 2014-02-09: 11:00:00
  Filled 2014-02-09: qty 1

## 2014-02-09 MED ORDER — PHENYLEPHRINE HCL 10 MG/ML IJ SOLN
INTRAMUSCULAR | Status: DC | PRN
  Administered 2014-02-09 (×4): 40 ug via INTRAVENOUS
  Administered 2014-02-09: 80 ug via INTRAVENOUS
  Administered 2014-02-09: 40 ug via INTRAVENOUS
  Administered 2014-02-09: 80 ug via INTRAVENOUS
  Administered 2014-02-09: 40 ug via INTRAVENOUS
  Administered 2014-02-09: 80 ug via INTRAVENOUS

## 2014-02-09 MED ORDER — THROMBIN 5000 UNITS EX SOLR
OROMUCOSAL | Status: DC | PRN
  Administered 2014-02-09: 09:00:00 via TOPICAL

## 2014-02-09 MED ORDER — MIDAZOLAM HCL 5 MG/5ML IJ SOLN
INTRAMUSCULAR | Status: DC | PRN
  Administered 2014-02-09: 2 mg via INTRAVENOUS

## 2014-02-09 MED ORDER — HYDROMORPHONE HCL PF 1 MG/ML IJ SOLN
0.2500 mg | INTRAMUSCULAR | Status: DC | PRN
  Administered 2014-02-09 (×4): 0.5 mg via INTRAVENOUS

## 2014-02-09 MED ORDER — PHENOL 1.4 % MT LIQD: 1.0000 | OROMUCOSAL | Status: DC | PRN

## 2014-02-09 MED ORDER — OXYCODONE HCL 5 MG PO TABS
5.0000 mg | ORAL_TABLET | Freq: Once | ORAL | Status: AC | PRN
  Administered 2014-02-09: 5 mg via ORAL

## 2014-02-09 MED ORDER — LIDOCAINE-EPINEPHRINE 1 %-1:100000 IJ SOLN
INTRAMUSCULAR | Status: DC | PRN
  Administered 2014-02-09: 5 mL

## 2014-02-09 MED ORDER — OXYCODONE HCL 5 MG/5ML PO SOLN: 5.0000 mg | Freq: Once | ORAL | Status: AC | PRN

## 2014-02-09 MED ORDER — ALUM & MAG HYDROXIDE-SIMETH 200-200-20 MG/5ML PO SUSP: 30.0000 mL | Freq: Four times a day (QID) | ORAL | Status: DC | PRN

## 2014-02-09 MED ORDER — HYDROMORPHONE HCL PF 1 MG/ML IJ SOLN: 1.0000 mg | Freq: Once | INTRAMUSCULAR | Status: AC

## 2014-02-09 MED ORDER — DEXAMETHASONE 4 MG PO TABS
4.0000 mg | ORAL_TABLET | Freq: Four times a day (QID) | ORAL | Status: DC
  Administered 2014-02-09 – 2014-02-11 (×7): 4 mg via ORAL
  Filled 2014-02-09 (×7): qty 1

## 2014-02-09 MED ORDER — PROMETHAZINE HCL 25 MG/ML IJ SOLN: 6.2500 mg | INTRAMUSCULAR | Status: DC | PRN

## 2014-02-09 MED ORDER — BACITRACIN ZINC 500 UNIT/GM EX OINT
TOPICAL_OINTMENT | CUTANEOUS | Status: DC | PRN
  Administered 2014-02-09: 1 via TOPICAL

## 2014-02-09 MED ORDER — GLYCOPYRROLATE 0.2 MG/ML IJ SOLN
INTRAMUSCULAR | Status: AC
  Filled 2014-02-09: qty 2

## 2014-02-09 SURGICAL SUPPLY — 71 items
ALLOSTEM STRIP 20MMX50MM (Tissue) ×3 IMPLANT
BAG DECANTER FOR FLEXI CONT (MISCELLANEOUS) ×3 IMPLANT
BENZOIN TINCTURE PRP APPL 2/3 (GAUZE/BANDAGES/DRESSINGS) ×3 IMPLANT
BIT DRILL 2.4XNS REUSE 3.5X (BIT) ×1 IMPLANT
BIT DRL 2.4XNS REUSE 3.5X (BIT) ×1
BLADE SURG 11 STRL SS (BLADE) ×3 IMPLANT
BLADE SURG ROTATE 9660 (MISCELLANEOUS) ×3 IMPLANT
BONE ALLOSTEM MORSELIZED 5CC (Bone Implant) ×3 IMPLANT
BUR MATCHSTICK NEURO 3.0 LAGG (BURR) ×3 IMPLANT
CANISTER SUCT 3000ML (MISCELLANEOUS) ×3 IMPLANT
CAP ELLIPSE LOCKING (Cap) ×30 IMPLANT
CLOSURE WOUND 1/2 X4 (GAUZE/BANDAGES/DRESSINGS) ×1
CONT SPEC 4OZ CLIKSEAL STRL BL (MISCELLANEOUS) ×3 IMPLANT
DECANTER SPIKE VIAL GLASS SM (MISCELLANEOUS) ×3 IMPLANT
DERMABOND ADHESIVE PROPEN (GAUZE/BANDAGES/DRESSINGS) ×2
DERMABOND ADVANCED (GAUZE/BANDAGES/DRESSINGS) ×2
DERMABOND ADVANCED .7 DNX12 (GAUZE/BANDAGES/DRESSINGS) ×1 IMPLANT
DERMABOND ADVANCED .7 DNX6 (GAUZE/BANDAGES/DRESSINGS) ×1 IMPLANT
DRAPE C-ARM 42X72 X-RAY (DRAPES) IMPLANT
DRAPE LAPAROTOMY 100X72 PEDS (DRAPES) ×3 IMPLANT
DRAPE MICROSCOPE LEICA (MISCELLANEOUS) IMPLANT
DRAPE POUCH INSTRU U-SHP 10X18 (DRAPES) ×3 IMPLANT
DRAPE SURG 17X23 STRL (DRAPES) ×12 IMPLANT
DRILL BIT 3.5 (BIT) ×2
DRSG OPSITE 4X5.5 SM (GAUZE/BANDAGES/DRESSINGS) ×6 IMPLANT
DRSG OPSITE POSTOP 4X8 (GAUZE/BANDAGES/DRESSINGS) ×3 IMPLANT
DURAPREP 26ML APPLICATOR (WOUND CARE) ×3 IMPLANT
ELECT REM PT RETURN 9FT ADLT (ELECTROSURGICAL) ×3
ELECTRODE REM PT RTRN 9FT ADLT (ELECTROSURGICAL) ×1 IMPLANT
GAUZE SPONGE 4X4 12PLY STRL (GAUZE/BANDAGES/DRESSINGS) ×3 IMPLANT
GAUZE SPONGE 4X4 16PLY XRAY LF (GAUZE/BANDAGES/DRESSINGS) IMPLANT
GLOVE BIO SURGEON STRL SZ8 (GLOVE) ×6 IMPLANT
GLOVE BIOGEL PI IND STRL 7.5 (GLOVE) ×3 IMPLANT
GLOVE BIOGEL PI INDICATOR 7.5 (GLOVE) ×6
GLOVE EXAM NITRILE LRG STRL (GLOVE) IMPLANT
GLOVE EXAM NITRILE MD LF STRL (GLOVE) IMPLANT
GLOVE EXAM NITRILE XL STR (GLOVE) IMPLANT
GLOVE EXAM NITRILE XS STR PU (GLOVE) IMPLANT
GLOVE INDICATOR 8.5 STRL (GLOVE) ×3 IMPLANT
GLOVE SURG SS PI 7.0 STRL IVOR (GLOVE) ×9 IMPLANT
GOWN STRL REUS W/ TWL LRG LVL3 (GOWN DISPOSABLE) IMPLANT
GOWN STRL REUS W/ TWL XL LVL3 (GOWN DISPOSABLE) ×4 IMPLANT
GOWN STRL REUS W/TWL 2XL LVL3 (GOWN DISPOSABLE) ×3 IMPLANT
GOWN STRL REUS W/TWL LRG LVL3 (GOWN DISPOSABLE)
GOWN STRL REUS W/TWL XL LVL3 (GOWN DISPOSABLE) ×8
KIT BASIN OR (CUSTOM PROCEDURE TRAY) ×3 IMPLANT
KIT ROOM TURNOVER OR (KITS) ×3 IMPLANT
MARKER SKIN DUAL TIP RULER LAB (MISCELLANEOUS) ×3 IMPLANT
MILL MEDIUM DISP (BLADE) ×3 IMPLANT
NEEDLE HYPO 25X1 1.5 SAFETY (NEEDLE) ×3 IMPLANT
NEEDLE SPNL 20GX3.5 QUINCKE YW (NEEDLE) ×3 IMPLANT
NS IRRIG 1000ML POUR BTL (IV SOLUTION) ×3 IMPLANT
PACK LAMINECTOMY NEURO (CUSTOM PROCEDURE TRAY) ×3 IMPLANT
PAD ARMBOARD 7.5X6 YLW CONV (MISCELLANEOUS) ×9 IMPLANT
PIN MAYFIELD SKULL DISP (PIN) ×3 IMPLANT
ROD 3.5X240MM (Rod) ×3 IMPLANT
RUBBERBAND STERILE (MISCELLANEOUS) IMPLANT
SCREW 3.5X12MM (Screw) ×30 IMPLANT
SPONGE LAP 4X18 X RAY DECT (DISPOSABLE) IMPLANT
SPONGE SURGIFOAM ABS GEL 100 (HEMOSTASIS) ×3 IMPLANT
STRIP CLOSURE SKIN 1/2X4 (GAUZE/BANDAGES/DRESSINGS) ×2 IMPLANT
SUT ETHILON 4 0 PS 2 18 (SUTURE) IMPLANT
SUT VIC AB 0 CT1 18XCR BRD8 (SUTURE) ×1 IMPLANT
SUT VIC AB 0 CT1 8-18 (SUTURE) ×2
SUT VIC AB 2-0 CT1 18 (SUTURE) ×6 IMPLANT
SUT VICRYL 4-0 PS2 18IN ABS (SUTURE) ×3 IMPLANT
SYR 20ML ECCENTRIC (SYRINGE) ×3 IMPLANT
TOWEL OR 17X24 6PK STRL BLUE (TOWEL DISPOSABLE) ×3 IMPLANT
TOWEL OR 17X26 10 PK STRL BLUE (TOWEL DISPOSABLE) ×3 IMPLANT
TRAY FOLEY CATH 14FRSI W/METER (CATHETERS) IMPLANT
WATER STERILE IRR 1000ML POUR (IV SOLUTION) ×3 IMPLANT

## 2014-02-09 NOTE — Progress Notes (Signed)
Patient ID: Gary Davis, male   DOB: 04/22/1956, 58 y.o.   MRN: [id number] Patient presents in the holding room in preparation for his posterior cervical decompressive laminectomy and fusion. He currently remains neurologically stable with a central cord injury significant weakness and atrophy of his hands contractures spasticity with preservation of most of his lower extremity strength. I have extensively gone over the risks and benefits of a C3-C7 posterior decompressive laminectomy and fusion as well as perioperative course and expectations of outcome and alternatives surgery he understands and agrees to proceed forward.

## 2014-02-09 NOTE — Anesthesia Postprocedure Evaluation (Signed)
  Anesthesia Post-op Note  Patient: Gary Davis  Procedure(s) Performed: Procedure(s): POSTERIOR CERVICAL FUSION/FORAMINOTOMY LEVEL 4  Cervical three to seven decompression and fusion with lateral mass screws (N/A)  Patient Location: PACU  Anesthesia Type:General  Level of Consciousness: awake, alert , oriented and patient cooperative  Airway and Oxygen Therapy: Patient Spontanous Breathing and Patient connected to nasal cannula oxygen  Post-op Pain: mild  Post-op Assessment: Post-op Vital signs reviewed, Patient's Cardiovascular Status Stable, Respiratory Function Stable, Patent Airway, No signs of Nausea or vomiting and Pain level controlled  Post-op Vital Signs: Reviewed and stable  Last Vitals:  Filed Vitals:   02/09/14 1115  BP: 148/76  Pulse: 61  Temp:   Resp: 12    Complications: No apparent anesthesia complications

## 2014-02-09 NOTE — Progress Notes (Signed)
PT Cancellation Note  Patient Details Name: Gary RidingMichael D Davis MRN: [id number] DOB: 06/29/1956   Cancelled Treatment:    Reason Eval/Treat Not Completed: Medical issues which prohibited therapy (Had surgery this am.  Will return in am.  )   [name],[name] 02/09/2014, 3:36 PM  [facility] River JunctionDawn [name],PT Acute Rehabilitation [phone] (pager)

## 2014-02-09 NOTE — Anesthesia Procedure Notes (Signed)
Procedure Name: Intubation Date/Time: 02/09/2014 7:36 AM Performed by: De NurseENNIE, Magnum Lunde E Pre-anesthesia Checklist: Patient identified, Emergency Drugs available, Suction available, Patient being monitored and Timeout performed Patient Re-evaluated:Patient Re-evaluated prior to inductionOxygen Delivery Method: Circle system utilized Preoxygenation: Pre-oxygenation with 100% oxygen Intubation Type: IV induction Ventilation: Mask ventilation without difficulty Laryngoscope Size: Mac and 4 Grade View: Grade I Tube type: Oral Tube size: 7.5 mm Number of attempts: 1 Airway Equipment and Method: Stylet and Video-laryngoscopy Placement Confirmation: ETT inserted through vocal cords under direct vision,  positive ETCO2 and breath sounds checked- equal and bilateral Secured at: 23 cm Tube secured with: Tape Dental Injury: Teeth and Oropharynx as per pre-operative assessment  Comments: Glide Scope used electively due to limited neck mobility.

## 2014-02-09 NOTE — Transfer of Care (Signed)
Immediate Anesthesia Transfer of Care Note  Patient: Gary FellsMichael D Orlich  Procedure(s) Performed: Procedure(s): POSTERIOR CERVICAL FUSION/FORAMINOTOMY LEVEL 4  Cervical three to seven decompression and fusion with lateral mass screws (N/A)  Patient Location: PACU  Anesthesia Type:General  Level of Consciousness: awake, alert  and oriented  Airway & Oxygen Therapy: Patient Spontanous Breathing and Patient connected to nasal cannula oxygen  Post-op Assessment: Report given to PACU RN and Patient moving all extremities X 4  Post vital signs: Reviewed and stable  Complications: No apparent anesthesia complications

## 2014-02-09 NOTE — Anesthesia Preprocedure Evaluation (Addendum)
Anesthesia Evaluation  Patient identified by MRN, date of birth, ID band Patient awake    Reviewed: Allergy & Precautions, H&P , NPO status , Patient's Chart, lab work & pertinent test results  History of Anesthesia Complications Negative for: history of anesthetic complications  Airway Mallampati: II TM Distance: >3 FB Neck ROM: Limited    Dental  (+) Teeth Intact, Dental Advisory Given   Pulmonary Current Smoker,  breath sounds clear to auscultation        Cardiovascular - anginanegative cardio ROS  Rhythm:Regular Rate:Normal     Neuro/Psych    GI/Hepatic negative GI ROS, Neg liver ROS,   Endo/Other  negative endocrine ROS  Renal/GU negative Renal ROS     Musculoskeletal   Abdominal   Peds  Hematology negative hematology ROS (+)   Anesthesia Other Findings   Reproductive/Obstetrics                       Anesthesia Physical Anesthesia Plan  ASA: III  Anesthesia Plan: General   Post-op Pain Management:    Induction: Intravenous  Airway Management Planned: Oral ETT and Video Laryngoscope Planned  Additional Equipment:   Intra-op Plan:   Post-operative Plan: Extubation in OR  Informed Consent: I have reviewed the patients History and Physical, chart, labs and discussed the procedure including the risks, benefits and alternatives for the proposed anesthesia with the patient or authorized representative who has indicated his/her understanding and acceptance.   Dental advisory given  Plan Discussed with: CRNA and Surgeon  Anesthesia Plan Comments: (Plan routine monitors, GETA with VideoGlide intubation  )        Anesthesia Quick Evaluation

## 2014-02-09 NOTE — Clinical Social Work Note (Signed)
CSW consulted for possible SNF placement at time of discharge. CSW to continue to follow and assist once discharge disposition determined by therapies. Thank you for the referral.  Marcelline DeistEmily Dejuana Weist, MSW, [facility] HospitalCSWA Licensed Clinical Social Worker [phone] and [phone] [phone]

## 2014-02-09 NOTE — Consult Note (Signed)
Physical Medicine and Rehabilitation Consult Reason for Consult: Central cord syndrome/myelopathy Referring Physician: Dr. Wynetta Emery   HPI: Gary Davis is a 58 y.o. right-handed male admitted 02/04/2014 with complaints of numbness and spasticity /weakness of both hands over the past 2 weeks. He reported not being able to grasp and hold objects due to these symptoms as well a couple of falls. . Cranial CT scan negative. X-rays and imaging revealed severe cervical stenosis from C3-C7 consistent with cervical central cord syndrome. Placed on intravenous Decadron. Underwent posterior cervical decompressive laminectomy and foraminotomies at C3-4, C4-5, C5-6, C6-7 with posterior cervical lateral mass screw fixation from C3-C7 02/09/2014 per Dr. Wynetta Emery. Hospital course pain management. Cervical collar at all times. Physical and occupational therapy evaluations are pending. M.D. has requested physical medicine rehabilitation consult  Patient states that he has had increasing bilateral hand weakness over the last 2 months. 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 Has noted numbness in both hands as well as in the big toes of both feet. Postoperatively he is already noticed some improvement in numbness  Review of Systems  Constitutional:       Weakness in his hands  Musculoskeletal: Positive for falls and myalgias.  Neurological:       Numbness to fingertips  All other systems reviewed and are negative.  Past Medical History  Diagnosis Date  . Arthritis   . Cataract    Past Surgical History  Procedure Laterality Date  . Arthroscopic repair acl Right   . Orif ankle fracture Right   . Finger fracture surgery      Right 4th Finger   History reviewed. No pertinent family history. Social History:  reports that he has been smoking Cigarettes.  He has been smoking about 0.33 packs per day. He does not have any smokeless tobacco history on file. He reports that he drinks alcohol. He reports that he  does not use illicit drugs. Allergies: No Known Allergies Medications Prior to Admission  Medication Sig Dispense Refill  . gabapentin (NEURONTIN) 100 MG capsule Take 1 capsule (100 mg total) by mouth 3 (three) times daily.  60 capsule  0  . ibuprofen (ADVIL,MOTRIN) 200 MG tablet Take 400 mg by mouth every 6 (six) hours as needed for moderate pain.         Home: Home Living Family/patient expects to be discharged to:: Private residence Living Arrangements: Alone Available Help at Discharge: Family;Available PRN/intermittently Type of Home: Apartment Home Access: Level entry Home Layout: One level Home Equipment: None  Functional History: Prior Function Level of Independence: Needs assistance Gait / Transfers Assistance Needed: Patient holding onto furniture and walls for ambulation. ADL's / Homemaking Assistance Needed: Difficulty with cooking due to decreased sensation/strength.  Did burn his hands several times. Functional Status:  Mobility: Bed Mobility Overal bed mobility: Modified Independent Transfers Overall transfer level: Modified independent Equipment used: None Ambulation/Gait Ambulation/Gait assistance: Supervision Ambulation Distance (Feet): 24 Feet Assistive device: None (Pushes IV pole for support) Gait Pattern/deviations: Step-through pattern;Decreased step length - right;Decreased step length - left;Decreased stride length;Shuffle Gait velocity: Decreased Gait velocity interpretation: Below normal speed for age/gender General Gait Details: Patient with slow, shuffling gait.  Unsteady, holding IV pole for support.    ADL: ADL Overall ADL's : Needs assistance/impaired Grooming: Wash/dry hands;Min guard;Standing Grooming Details (indicate cue type and reason): ataxic UE movement Toilet Transfer: Minimal assistance;Ambulation;Regular Toilet Functional mobility during ADLs: Minimal assistance General ADL Comments: Pt typing on cell phone on  arrival. pt with  most movement with thumbs. pt with weak lateral pinch. Pt requires extended time to use cell phone.   Cognition: Cognition Overall Cognitive Status: Impaired/Different from baseline Orientation Level: Oriented X4 Cognition Arousal/Alertness: Awake/alert Behavior During Therapy: WFL for tasks assessed/performed Overall Cognitive Status: Impaired/Different from baseline Area of Impairment: Awareness Awareness: Anticipatory General Comments: Pt without ccollar on . pt removed ccollar and states "its itchy"   Blood pressure 148/76, pulse 57, temperature 98.1 F (36.7 C), temperature source Oral, resp. rate 13, height $RemoveBeforeDEI _AyHmItOmGgHjmfDEoStTwWpzqpFBxwwx$6\' 1"2 100.00%. Physical Exam  Vitals reviewed. Constitutional: He is oriented to person, place, and time. He appears well-developed.  HENT:  Head: Normocephalic.  Eyes: EOM are normal.  Neck:  Hard cervical collar in place  Cardiovascular: Normal rate and regular rhythm.   Respiratory: Effort normal and breath sounds normal. No respiratory distress.  GI: Soft. Bowel sounds are normal. He exhibits no distension.  Neurological: He is alert and oriented to person, place, and time.  Skin:  Surgical site is dressed   motor strength is 3 minus bilateral deltoid biceps triceps and finger flexion finger extension wrist flexion and wrist extension. Decreased sensation in bilateral C5 C6-C7 dermatomal distribution 4 minus in the hip flexors knee extensors ankle dorsiflexors and plantar flexors bilaterally Decreased sensation bilateral big toes  Results for orders placed during the hospital encounter of 02/04/14 (from the past 24 hour(s))  SURGICAL PCR SCREEN     Status: None   Collection Time    02/09/14  6:02 AM      Result Value Ref Range   MRSA, PCR NEGATIVE  NEGATIVE   Staphylococcus aureus NEGATIVE  NEGATIVE   Dg Cervical Spine 1 View  02/09/2014   CLINICAL DATA:  Posterior cervical fusion  EXAM: DG C-ARM 61-120 MIN;  DG CERVICAL SPINE - 1 VIEW  FLUOROSCOPY TIME:  9 seconds  COMPARISON:  None  FINDINGS: Posterior cervical fusion and decompression from C3 through C7. Bilateral posterior elements screws and vertical stabilizing rods are noted. Mild degenerative disc disease throughout the cervical spine.  Endotracheal tube and orogastric tubes are noted.  IMPRESSION: Posterior cervical fusion from C3 through C7.   Electronically Signed   By: Elige KoHetal  Patel   On: 02/09/2014 10:33   Dg C-arm 1-60 Min  02/09/2014   CLINICAL DATA:  Posterior cervical fusion  EXAM: DG C-ARM 61-120 MIN; DG CERVICAL SPINE - 1 VIEW  FLUOROSCOPY TIME:  9 seconds  COMPARISON:  None  FINDINGS: Posterior cervical fusion and decompression from C3 through C7. Bilateral posterior elements screws and vertical stabilizing rods are noted. Mild degenerative disc disease throughout the cervical spine.  Endotracheal tube and orogastric tubes are noted.  IMPRESSION: Posterior cervical fusion from C3 through C7.   Electronically Signed   By: Elige KoHetal  Patel   On: 02/09/2014 10:33    Assessment/Plan: Diagnosis: Central cord syndrome status post C3-C7 posterior decompression and fusion postoperative day #1 1. Does the need for close, 24 hr/day medical supervision in concert with the patient's rehab needs make it unreasonable for this patient to be served in a less intensive setting? Yes 2. Co-Morbidities requiring supervision/potential complications: Pain control, balance disorder, fine motor deficits in both hands, quadriparesis 3. Due to bladder management, bowel management, safety, skin/wound care, disease management, medication administration, pain management and patient education, does the patient require 24 hr/day rehab nursing? Yes 4. Does the patient require coordinated care of a physician, rehab  nurse, PT (1-2 hrs/day, 5 days/week) and OT (1-2 hrs/day, 5 days/week) to address physical and functional deficits in the context of the above medical diagnosis(es)?  Potentially Addressing deficits in the following areas: balance, endurance, locomotion, strength, transferring, bowel/bladder control, bathing, dressing, feeding, grooming and toileting 5. Can the patient actively participate in an intensive therapy program of at least 3 hrs of therapy per day at least 5 days per week? Potentially 6. The potential for patient to make measurable gains while on inpatient rehab is excellent 7. Anticipated functional outcomes upon discharge from inpatient rehab are modified independent  with PT, supervision with OT, n/a with SLP. 8. Estimated rehab length of stay to reach the above functional goals is: 14-18 days 9. Does the patient have adequate social supports to accommodate these discharge functional goals? Yes and Potentially 10. Anticipated D/C setting: Home 11. Anticipated post D/C treatments: HH therapy 12. Overall Rehab/Functional Prognosis: excellent  RECOMMENDATIONS: This patient's condition is appropriate for continued rehabilitative care in the following setting: CIR Patient has agreed to participate in recommended program. Potentially Note that insurance prior authorization may be required for reimbursement for recommended care.  Comment: Should be ready once drainage cath is removed and postoperative pain is controlled    02/09/2014

## 2014-02-09 NOTE — Progress Notes (Signed)
Pt. Stating his pain is still a 10 on pain scale, but having to remind pt to breath, and pt falling asleep

## 2014-02-09 NOTE — Op Note (Signed)
Preoperative diagnosis: Cervical spondylitic myelopathy central cord syndrome cervical stenosis C3-4, C4-5, C5-6, C6-7.  Postoperative diagnosis: Same  Procedure: Posterior cervical decompressive laminectomy and foraminotomies at C3-4, C4-5, C5-6, C6-7 with posterior cervical lateral mass screw fixation from C3-C7 using the globus ellipse lateral mass system and posterior lateral fusion with locally harvested autograft mixed with Allostem morsels and strips  Surgeon: Jillyn HiddenGary Shanitra Phillippi  Assistant: Barnett AbuHenry Elsner  Anesthesia: Gen.  EBL: Minimal  History of present illness: Patient is a 58 year old woman who has had 3 months of progressive worsening neck pain and arm weakness numbness tingling his fingers spasticity stiffness difficulty walking who presented to emergency room after couple falls workup revealed severe cervical stenosis from C3-C7 with signal changes cord. His exam was consistent with a cervical central cord syndrome this was placed on IV Decadron and brought to the OR for posterior cervical decompression and stabilization procedure. I extensively went over the risks and benefits of the operation the patient as well as  Course expectations of outcome of her surgery and he understood and agreed to proceed forward.  Operative procedure: Patient brought into the or was induced under general anesthesia and positioned prone in in pins posterior aspect of his head and neck were shaved prepped and draped in routine sterile fashion a midline incision was made after infiltration of 10 cc lidocaine with epi and Bovie light cautery was used to gas subcutaneous tissues and subperiosteal dissections carried out on the laminaC3-C4 C5-C6 and C7. Interoperative x-ray identified the C3-4 disc space so at this point pilot holes were drilled in the inferomedial aspect lateral masses at all levels at C3, C4, C5, C6, C7. These holes were then drilled bilaterally probed tapped and then 12 mm lateral mass screws were  placed. All screws excellent purchase at this point the decompression was begun spinous processes at C3, C4, C5, C6 and part of C7 with a removed central decompression was begun by using a 2 mm Kerrison marking out the lateral gutters bilaterally and then removing the central lamina en bloc to minimize ambulation of the spinal cord. This decompression was carried out for part of the lamina of C7 all of C6 all of C5 all of C4 and two thirds of C3. Then marking underneath the lateral gutter identified the proximal neuroforamina at C4, C5, C6, and C7. This is all decompressed. After adequate decompression the rods were then cut and fashioned and inserted top tightening nuts were loaded and locked in place bilaterally at this point all the lateral masses and facet complexes were aggressively decorticated the mixture of autograft and morcellized Alice M. was then packed in the facet joints at C3-4 C4-5 C5-6 and C6-7. Then the remainder of the Alice temp strips and morcellized allograft and autograft was packed on top of the strips and lateral masses from C3-C7. Utilizing Surgifoam and Gelfoam was overlaid top of the dura the wound scope was irrigated meticulous in space was maintained a medium neck drain was placed there is some muscle approximating sutures put in and then the facet closed tightly as well as subcutaneous tissue and a subcuticular stitch was placed and the skin. Dermabond benzo and Steri-Strips a sterile dressing were applied patient recovered in stable condition. At the end of case on it counts sponge counts were correct.

## 2014-02-10 DIAGNOSIS — M4712 Other spondylosis with myelopathy, cervical region: Secondary | ICD-10-CM

## 2014-02-10 NOTE — Clinical Social Work Placement (Signed)
Clinical Social Work Department CLINICAL SOCIAL WORK PLACEMENT NOTE 02/10/2014  Patient:  Gary Davis,Gary Davis  Account Number:  [id number] Admit date:  02/04/2014  Clinical Social Worker:  Merlyn LotJENNA HOLOMAN, CLINICAL SOCIAL WORKER  Date/time:  02/10/2014 02:45 PM  Clinical Social Work is seeking post-discharge placement for this patient at the following level of care:   SKILLED NURSING   (*CSW will update this form in Epic as items are completed)   02/10/2014  Patient/family provided with [facility] of Clinical Social Work's list of facilities offering this level of care within the geographic area requested by the patient (or if unable, by the patient's family).  02/10/2014  Patient/family informed of their freedom to choose among providers that offer the needed level of care, that participate in Medicare, Medicaid or managed care program needed by the patient, have an available bed and are willing to accept the patient.  02/10/2014  Patient/family informed of [facility]' ownership interest in [facility], as well as of the fact that they are under no obligation to receive care at this facility.  PASARR submitted to EDS on 02/10/2014 PASARR number received on 02/10/2014  FL2 transmitted to all facilities in geographic area requested by pt/family on  02/10/2014 FL2 transmitted to all facilities within larger geographic area on   Patient informed that his/her managed care company has contracts with or will negotiate with  certain facilities, including the following:     Patient/family informed of bed offers received:   Patient chooses bed at  Physician recommends and patient chooses bed at    Patient to be transferred to  on   Patient to be transferred to facility by  Patient and family notified of transfer on  Name of family member notified:    The following physician request were entered in Epic:   Additional Comments:  Merlyn LotJenna Holoman, [facility] Clinical  Social Worker [phone]

## 2014-02-10 NOTE — Evaluation (Signed)
Occupational Therapy Evaluation Patient Details Name: Gary RidingMichael D Talamo MRN: [id number] DOB: 08/04/1955 Today's Date: 02/10/2014    History of Present Illness Patient is a 58 yo male admitted 02/04/14 with numbness and weakness in both hands.  MRI - Multilevel cervical spinal cord compression due to severe combined degenerative and congenital stenosis.  The level C3-C4 through C5-C6 are affected.  Per neurosurgery note, patient now s/p posterior cervical fusion x 4 levels, 02/09/14.  PMH:  HTN, arthritis.   Clinical Impression   Patient is s/p ACDF surgery resulting in functional limitations due to the deficits listed below (see OT problem list). PTA independent with progressive weakness of BIL UE.  Patient will benefit from skilled OT acutely to increase independence and safety with ADLS to allow discharge CIR. OT to help strengthen BIL UE and fine motor HEP. Pt currently requires (A) due to ataxic UE movement.      Follow Up Recommendations  CIR    Equipment Recommendations  Other (comment) (defer)    Recommendations for Other Services       Precautions / Restrictions Precautions Precautions: Fall Precaution Comments: central cord syndrome Required Braces or Orthoses: Cervical Brace Cervical Brace: Hard collar;At all times      Mobility Bed Mobility Overal bed mobility: Needs Assistance Bed Mobility: Supine to Sit Rolling: Supervision         General bed mobility comments: cues for cervical safety wtih mobility  Transfers Overall transfer level: Needs assistance Equipment used: Rolling walker (2 wheeled) Transfers: Sit to/from Stand Sit to Stand: Min assist              Balance                                            ADL Overall ADL's : Needs assistance/impaired Eating/Feeding: Minimal assistance;Sitting Eating/Feeding Details (indicate cue type and reason): pt verbalized feeling like left digits "lock up" Question trigger finger ? Pt  dropping items unable to sustain grasp Grooming: Min guard;Sitting;Wash/dry face;Wash/dry hands (cues for safety wtih RW) Grooming Details (indicate cue type and reason): ataxic UE movement             Lower Body Dressing: Min guard;Sit to/from stand Lower Body Dressing Details (indicate cue type and reason): pt able to with extensive effort don underwear. Pt required restbreak due to pain Toilet Transfer: Minimal assistance;Ambulation;RW;Regular Toilet           Functional mobility during ADLs: Minimal assistance;Rolling walker General ADL Comments: Pt demonstrates UE ataxic fine motor deficits . Pt lives alone and needs (A) at current level. CIR could help progress patient to MOD I level     Vision                     Perception     Praxis      Pertinent Vitals/Pain Pain Assessment: 0-10 Pain Score: 8  Pain Intervention(s): Repositioned     Hand Dominance Right   Extremity/Trunk Assessment Upper Extremity Assessment Upper Extremity Assessment: RUE deficits/detail;LUE deficits/detail;Generalized weakness RUE Deficits / Details: shld flex 3-/5, elbow flex 3+/5, wrist hand 3/5 grip 3-/5 RUE Sensation: decreased light touch RUE Coordination: decreased fine motor;decreased gross motor LUE Deficits / Details: shld flex 2+/5, elbow flexion 3+/5, wrist 3/5 and grip 3+/5 LUE: Unable to fully assess due to pain LUE Sensation: decreased light touch LUE Coordination: decreased  fine motor;decreased gross motor   Lower Extremity Assessment Lower Extremity Assessment: Defer to PT evaluation   Cervical / Trunk Assessment Cervical / Trunk Assessment: Other exceptions (surg)   Communication Communication Communication: No difficulties   Cognition Arousal/Alertness: Awake/alert Behavior During Therapy: WFL for tasks assessed/performed Overall Cognitive Status: Impaired/Different from baseline Area of Impairment: Awareness           Awareness: Anticipatory        General Comments       Exercises       Shoulder Instructions      Home Living Family/patient expects to be discharged to:: Private residence Living Arrangements: Alone Available Help at Discharge: Family;Available PRN/intermittently Type of Home: Apartment Home Access: Level entry     Home Layout: One level     Bathroom Shower/Tub: Chief Strategy Officer: Standard     Home Equipment: None   Additional Comments: pt agreeable to rehab prior to d/c home to achieve safe mod I function      Prior Functioning/Environment Level of Independence: Independent  Gait / Transfers Assistance Needed: pt with freq falls prior to admission and difficulty holding onto things ADL's / Homemaking Assistance Needed: Difficulty with cooking due to decreased sensation/strength.  Did burn his hands several times.        OT Diagnosis: Generalized weakness;Acute pain;Ataxia   OT Problem List: Decreased strength;Decreased activity tolerance;Impaired balance (sitting and/or standing);Decreased safety awareness;Decreased knowledge of use of DME or AE;Decreased knowledge of precautions;Pain;Impaired UE functional use;Impaired sensation   OT Treatment/Interventions: Self-care/ADL training;Therapeutic exercise;Neuromuscular education;DME and/or AE instruction;Therapeutic activities;Patient/family education;Balance training    OT Goals(Current goals can be found in the care plan section) Acute Rehab OT Goals Patient Stated Goal: to get stronger OT Goal Formulation: With patient Time For Goal Achievement: 02/24/14 Potential to Achieve Goals: Good  OT Frequency: Min 2X/week   Barriers to D/C: Decreased caregiver support          Co-evaluation              End of Session Nurse Communication: Mobility status;Precautions  Activity Tolerance: Patient tolerated treatment well Patient left: in chair;with call bell/phone within reach (pillows for BIL UE to decr pain at neck)    Time: 9562-1308 OT Time Calculation (min): 18 min Charges:  OT General Charges $OT Visit: 1 Procedure OT Evaluation $Initial OT Evaluation Tier I: 1 Procedure OT Treatments $Self Care/Home Management : 8-22 mins G-Codes:    Harolyn Rutherford Feb 11, 2014, 3:28 PM Pager: [phone]

## 2014-02-10 NOTE — Progress Notes (Addendum)
Rehab admissions - I met with pt in follow up to rehab MD consult and explained the possibility of inpatient rehab. Questions were answered and informational brochures given. Pt is interested in pursuing inpatient rehab and wants to be as independent as possible.  I will now open his case with [facility]ley Bouche to seek insurance authorization. I will keep the pt and medical team aware of updates.  Rehab MD made note that we will consider inpatient rehab admission once drainage cath is removed and pain is controlled, pending insurance authorization as well.  Please call me with any questions. Thanks.  Nanetta Batty, PT Rehabilitation Admissions Coordinator [phone]

## 2014-02-10 NOTE — Progress Notes (Signed)
[facility] PROGRESS NOTE  Assessment/Plan: Bilateral arm weakness due to Cord compression syndrome - on IV decadron. S/p 8.10.2015: Posterior cervical decompressive laminectomy and foraminotomies at C3-4, C4-5, C5-6, C6-7 with posterior cervical lateral mass screw fixation from C3-C7 - PT rec CIR admisison. - consult CIR.  Tobacco use disorder: - counseling.  Arthritis - cont pain controlled.    Code Status: FULL CODE  Family Communication: No Family Present  Disposition Plan: Inpatient     Consultants: Neurosurgery  Procedures: Laminectomy with decompression 8.10.2015  Antibiotics:  none  HPI/Subjective: neck pain  Objective: Filed Vitals:   02/09/14 2136 02/10/14 0149 02/10/14 0639 02/10/14 1036  BP: 116/79 162/82 126/77 118/71  Pulse: 87 51 55 60  Temp: 97.5 F (36.4 C) 98 F (36.7 C) 97.7 F (36.5 C) 97.5 F (36.4 C)  TempSrc: Oral Oral Oral Axillary  Resp: 18 20 18 18   Height:      Weight:      SpO2: 100% 97% 98% 97%    Intake/Output Summary (Last 24 hours) at 02/10/14 1114 Last data filed at 02/10/14 0600  Gross per 24 hour  Intake    360 ml  Output   4425 ml  Net  -4065 ml   Filed Weights   02/04/14 2147  Weight: 71.986 kg (158 lb 11.2 oz)    Exam:  General: Alert, awake, oriented x3, in no acute distress.  HEENT: No bruits, no goiter.  Heart: Regular rate and rhythm. Lungs: Good air movement, clear Abdomen: Soft, nontender, nondistended, positive bowel sounds.     Data Reviewed: Basic Metabolic Panel:  Recent Labs Lab 02/04/14 1549 02/05/14 0721  NA 138 137  K 4.1 4.0  CL 98 99  CO2 23 24  GLUCOSE 103* 176*  BUN 18 14  CREATININE 0.84 0.69  CALCIUM 9.1 8.7   Liver Function Tests:  Recent Labs Lab 02/04/14 1549  AST 26  ALT 12  ALKPHOS 81  BILITOT 0.9  PROT 7.7  ALBUMIN 4.3   No results found for this basename: LIPASE, AMYLASE,  in the last 168 hours No results found for this basename: AMMONIA,   in the last 168 hours CBC:  Recent Labs Lab 02/04/14 1549 02/05/14 0721  WBC 4.5 2.3*  NEUTROABS 2.5  --   HGB 15.2 14.2  HCT 42.6 40.9  MCV 97.0 97.6  PLT 139* 137*   Cardiac Enzymes: No results found for this basename: CKTOTAL, CKMB, CKMBINDEX, TROPONINI,  in the last 168 hours BNP (last 3 results) No results found for this basename: PROBNP,  in the last 8760 hours CBG:  Recent Labs Lab 02/04/14 1656  GLUCAP 102*    Recent Results (from the past 240 hour(s))  SURGICAL PCR SCREEN     Status: None   Collection Time    02/09/14  6:02 AM      Result Value Ref Range Status   MRSA, PCR NEGATIVE  NEGATIVE Final   Staphylococcus aureus NEGATIVE  NEGATIVE Final   Comment:            The Xpert SA Assay (FDA     approved for NASAL specimens     in patients over 102 years of age),     is one component of     a comprehensive surveillance     program.  Test performance has     been validated by [employer] for patients greater     than or equal to 1  year old.     It is not intended     to diagnose infection nor to     guide or monitor treatment.     Studies: Dg Cervical Spine 1 View  02/09/2014   CLINICAL DATA:  Posterior cervical fusion  EXAM: DG C-ARM 61-120 MIN; DG CERVICAL SPINE - 1 VIEW  FLUOROSCOPY TIME:  9 seconds  COMPARISON:  None  FINDINGS: Posterior cervical fusion and decompression from C3 through C7. Bilateral posterior elements screws and vertical stabilizing rods are noted. Mild degenerative disc disease throughout the cervical spine.  Endotracheal tube and orogastric tubes are noted.  IMPRESSION: Posterior cervical fusion from C3 through C7.   Electronically Signed   By: Elige KoHetal  Patel   On: 02/09/2014 10:33   Dg C-arm 1-60 Min  02/09/2014   CLINICAL DATA:  Posterior cervical fusion  EXAM: DG C-ARM 61-120 MIN; DG CERVICAL SPINE - 1 VIEW  FLUOROSCOPY TIME:  9 seconds  COMPARISON:  None  FINDINGS: Posterior cervical fusion and decompression from C3 through C7.  Bilateral posterior elements screws and vertical stabilizing rods are noted. Mild degenerative disc disease throughout the cervical spine.  Endotracheal tube and orogastric tubes are noted.  IMPRESSION: Posterior cervical fusion from C3 through C7.   Electronically Signed   By: Elige KoHetal  Patel   On: 02/09/2014 10:33    Scheduled Meds: . dexamethasone  4 mg Intravenous 4 times per day   Or  . dexamethasone  4 mg Oral 4 times per day  . docusate sodium  100 mg Oral BID  . gabapentin  100 mg Oral TID  . nicotine  7 mg Transdermal Daily  . pantoprazole  40 mg Oral BID  . sodium chloride  3 mL Intravenous Q12H   Continuous Infusions: . 0.45 % NaCl with KCl 20 mEq / L 75 mL/hr at 02/09/14 1420  . sodium chloride 100 mL/hr at 02/09/14 0025  . sodium chloride       Marinda ElkFELIZ ORTIZ, ABRAHAM  [facility] Pager [phone]. If 8PM-8AM, please contact night-coverage at [email or web address], password [email or web address] 02/10/2014, 11:14 AM  LOS: 6 days      **Disclaimer: This note may have been dictated with voice recognition software. Similar sounding words can inadvertently be transcribed and this note may contain transcription errors which may not have been corrected upon publication of note.**

## 2014-02-10 NOTE — Clinical Social Work Psychosocial (Signed)
Clinical Social Work Department BRIEF PSYCHOSOCIAL ASSESSMENT 02/10/2014  Patient:  Gary Davis, Gary Davis     Account Number:  [id number]     Admit date:  02/04/2014  Clinical Social Worker:  Domenica Reamer, [place]  Date/Time:  02/10/2014 02:29 PM  Referred by:  Physician  Date Referred:  02/10/2014 Referred for  SNF Placement   Other Referral:   CIR   Interview type:  Patient Other interview type:   none    PSYCHOSOCIAL DATA Living Status:  ALONE Admitted from facility:   Level of care:   Primary support name:  [name] Primary support relationship to patient:  SIBLING Degree of support available:   Patient reported that he felt as if the brother is willing to help out with some of his future care needs.    CURRENT CONCERNS Current Concerns  Post-Acute Placement   Other Concerns:   none at this time    SOCIAL WORK ASSESSMENT / PLAN CSW met with patient to discuss PT's recommendation of CIR. CSW offered SNF backup in case CIR doesn't work out. Patient was agreeable to SNF placement and expressed interest in [facility]) facilities. Patient stated that he was living alone in an apartment before coming to the hospital and reported that he was not receiving any in home help but had some assistance/support from his brother when needed.  CSW will send patients information to [facility] and discuss available options with the patient.  CSW will continue to follow for discharge needs   Assessment/plan status:  Psychosocial Support/Ongoing Assessment of Needs Other assessment/ plan:   none   Information/referral to community resources:   [facility] SNF bed offers    PATIENT'S/FAMILY'S RESPONSE TO PLAN OF CARE: Patient was agreeable to SNF placement but expressed a preference for inpatient rehab so that he can get better quickly and begin searching for work.       Domenica Reamer, [place] Social Worker [phone]

## 2014-02-10 NOTE — Evaluation (Signed)
Physical Therapy Evaluation Patient Details Name: Gary RidingMichael D Whitby MRN: [id number] DOB: 07/03/1955 Today's Date: 02/10/2014   History of Present Illness  Patient is a [age] yo male admitted 02/04/14 with numbness and weakness in both hands.  MRI - Multilevel cervical spinal cord compression due to severe combined degenerative and congenital stenosis.  The level C3-C4 through C5-C6 are affected.  Per neurosurgery note, patient now s/p posterior cervical fusion x 4 levels, 02/09/14.  PMH:  HTN, arthritis.  Clinical Impression  Pt presenting with central cord syndrome bilat UE weakness greater than LE weakness greatly limiting pt's functional mobility and completion of ADLs. Pt also with impaired balance and increased falls risk.  Pt motivated and desires to return home alone. Pt an excellent rehab candidate and demo's excellent rehab potential and suspect pt can achieve safe mod I function for safe transition home after CIR.    Follow Up Recommendations CIR    Equipment Recommendations  Rolling walker with 5" wheels    Recommendations for Other Services Rehab consult     Precautions / Restrictions Precautions Precautions: Fall Precaution Comments: central cord syndrome Required Braces or Orthoses: Cervical Brace Cervical Brace: Hard collar;At all times Restrictions Weight Bearing Restrictions: No      Mobility  Bed Mobility Overal bed mobility: Needs Assistance Bed Mobility: Rolling;Sidelying to Sit;Sit to Sidelying Rolling: Supervision Sidelying to sit: Min guard     Sit to sidelying: Min guard General bed mobility comments: max v/c's for technique,   Transfers Overall transfer level: Needs assistance Equipment used: None (elevated surface height) Transfers: Sit to/from Stand Sit to Stand: Min assist;From elevated surface         General transfer comment: increased time, definite use of hands, minA to achieve full upright standing  Ambulation/Gait Ambulation/Gait  assistance: Min assist Ambulation Distance (Feet): 75 Feet Assistive device: Standard walker Gait Pattern/deviations: Step-through pattern;Decreased stride length;Ataxic Gait velocity: Decreased   General Gait Details: attempted to ambulate with out RW, pt reaching for objects to hold onto. Pt with bilat LE pain and staggering gait pattern, pt with improved stability and increased step length with RW  Stairs            Wheelchair Mobility    Modified Rankin (Stroke Patients Only)       Balance Overall balance assessment: Needs assistance Sitting-balance support: Feet supported Sitting balance-Leahy Scale: Good Sitting balance - Comments: pt with good balance however due to cervical pain pt repeated altered sitting position   Standing balance support: Bilateral upper extremity supported Standing balance-Leahy Scale: Poor Standing balance comment: pt requires UE support or RW to maintain stable in upright position                             Pertinent Vitals/Pain Pain Assessment: 0-10 Pain Score: 10-Worst pain ever Pain Location: surgical site and upper traps Pain Intervention(s): RN gave pain meds during session    Home Living Family/patient expects to be discharged to:: Private residence Living Arrangements: Alone Available Help at Discharge: Family;Available PRN/intermittently Type of Home: Apartment Home Access: Level entry     Home Layout: One level Home Equipment: None Additional Comments: pt agreeable to rehab prior to d/c home to achieve safe mod I function    Prior Function Level of Independence: Independent   Gait / Transfers Assistance Needed: pt with freq falls prior to admission and difficulty holding onto things  ADL's / Homemaking Assistance Needed: Difficulty with cooking  due to decreased sensation/strength.  Did burn his hands several times.        Hand Dominance   Dominant Hand: Right    Extremity/Trunk Assessment   Upper  Extremity Assessment: RUE deficits/detail;LUE deficits/detail RUE Deficits / Details: shld flex 3-/5, elbow flex 3+/5, wrist hand 3/5 grip 3-/5 RUE:  (also limited by pain in shoulder) RUE Sensation: decreased light touch (in wrist and hand, pt reports better s/p surgery) LUE Deficits / Details: shld flex 2+/5, elbow flexion 3+/5, wrist 3/5 and grip 3+/5   Lower Extremity Assessment: RLE deficits/detail;LLE deficits/detail RLE Deficits / Details: hip flexion limited to 3-/5 due to pain, knee ext 3/5, knee flex 3/5, ankle 3/5, onset of pain with resisted MMT LLE Deficits / Details: hip flexion limited to 3-/5 due to pain, knee ext 3/5, knee flex 3/5, ankle 3/5, onset of pain with resisted MMT  Cervical / Trunk Assessment:  (recent surgery)  Communication   Communication: No difficulties  Cognition Arousal/Alertness: Awake/alert Behavior During Therapy: WFL for tasks assessed/performed Overall Cognitive Status: Impaired/Different from baseline                      General Comments      Exercises        Assessment/Plan    PT Assessment Patient needs continued PT services  PT Diagnosis Acute pain;Difficulty walking   PT Problem List Decreased strength;Decreased activity tolerance;Decreased balance;Decreased mobility;Decreased coordination;Decreased knowledge of use of DME;Impaired sensation;Pain  PT Treatment Interventions DME instruction;Gait training;Functional mobility training;Balance training;Therapeutic activities;Patient/family education   PT Goals (Current goals can be found in the Care Plan section) Acute Rehab PT Goals Patient Stated Goal: to get stronger PT Goal Formulation: With patient Time For Goal Achievement: 02/24/14 Potential to Achieve Goals: Good    Frequency Min 4X/week   Barriers to discharge Decreased caregiver support pt lives alone    Co-evaluation               End of Session Equipment Utilized During Treatment: Gait belt;Cervical  collar Activity Tolerance: Patient limited by pain Patient left: in bed;with call bell/phone within reach;with nursing/sitter in room Nurse Communication: Mobility status         Time: 0940-1005 PT Time Calculation (min): 25 min   Charges:   PT Evaluation $PT Re-evaluation: 1 Procedure PT Treatments $Gait Training: 8-22 mins   PT G CodesMarcene Brawn 02/10/2014, 10:30 AM  Lewis Shock, PT, DPT Pager #: [phone] Office #: [phone]

## 2014-02-10 NOTE — Progress Notes (Signed)
Rehab Admissions Coordinator Note:  Patient was screened by Trish MageLogue, Alauna Hayden M for appropriateness for an Inpatient Acute Rehab Consult.  At this time, an inpatient rehab consult has been ordered and is pending completion.     Trish MageLogue, Kylon Philbrook M 02/10/2014, 11:27 AM  My partner, Campbell LernerJanine, will follow up once consult is completed.  She can be reached at [phone]

## 2014-02-11 ENCOUNTER — Inpatient Hospital Stay (HOSPITAL_COMMUNITY)
Admission: AD | Admit: 2014-02-11 | Discharge: 2014-02-20 | DRG: 945 | Disposition: A | Discharge status: Home / Self Care | Payer: No Typology Code available for payment source | Source: Intra-hospital | Attending: Physical Medicine & Rehabilitation | Admitting: Physical Medicine & Rehabilitation

## 2014-02-11 ENCOUNTER — Encounter (HOSPITAL_COMMUNITY): Payer: Self-pay | Admitting: Neurosurgery

## 2014-02-11 DIAGNOSIS — S14129A Central cord syndrome at unspecified level of cervical spinal cord, initial encounter: Secondary | ICD-10-CM | POA: Diagnosis present

## 2014-02-11 DIAGNOSIS — Z79899 Other long term (current) drug therapy: Secondary | ICD-10-CM | POA: Diagnosis not present

## 2014-02-11 DIAGNOSIS — Z5189 Encounter for other specified aftercare: Secondary | ICD-10-CM | POA: Diagnosis present

## 2014-02-11 DIAGNOSIS — G8250 Quadriplegia, unspecified: Secondary | ICD-10-CM

## 2014-02-11 DIAGNOSIS — G589 Mononeuropathy, unspecified: Secondary | ICD-10-CM

## 2014-02-11 DIAGNOSIS — M4712 Other spondylosis with myelopathy, cervical region: Secondary | ICD-10-CM | POA: Diagnosis not present

## 2014-02-11 DIAGNOSIS — F17200 Nicotine dependence, unspecified, uncomplicated: Secondary | ICD-10-CM

## 2014-02-11 DIAGNOSIS — Z981 Arthrodesis status: Secondary | ICD-10-CM

## 2014-02-11 DIAGNOSIS — K5900 Constipation, unspecified: Secondary | ICD-10-CM | POA: Diagnosis not present

## 2014-02-11 MED ORDER — GABAPENTIN 100 MG PO CAPS
100.0000 mg | ORAL_CAPSULE | Freq: Three times a day (TID) | ORAL | Status: DC
Start: 2014-02-11 — End: 2014-02-13
  Administered 2014-02-11 – 2014-02-13 (×5): 100 mg via ORAL
  Filled 2014-02-11 (×8): qty 1

## 2014-02-11 MED ORDER — ONDANSETRON HCL 4 MG PO TABS: 4.0000 mg | ORAL_TABLET | Freq: Four times a day (QID) | ORAL | Status: DC | PRN

## 2014-02-11 MED ORDER — TRAZODONE HCL 50 MG PO TABS
50.0000 mg | ORAL_TABLET | Freq: Every day | ORAL | Status: DC
  Administered 2014-02-11 – 2014-02-19 (×9): 50 mg via ORAL
  Filled 2014-02-11 (×10): qty 1

## 2014-02-11 MED ORDER — DEXAMETHASONE 2 MG PO TABS
2.0000 mg | ORAL_TABLET | Freq: Two times a day (BID) | ORAL | Status: DC
  Administered 2014-02-11: 2 mg via ORAL
  Filled 2014-02-11: qty 1

## 2014-02-11 MED ORDER — PNEUMOCOCCAL VAC POLYVALENT 25 MCG/0.5ML IJ INJ: 0.5000 mL | INJECTION | INTRAMUSCULAR | Status: DC

## 2014-02-11 MED ORDER — CYCLOBENZAPRINE HCL 10 MG PO TABS: 10.0000 mg | ORAL_TABLET | Freq: Three times a day (TID) | ORAL | Status: DC | PRN

## 2014-02-11 MED ORDER — DOCUSATE SODIUM 100 MG PO CAPS
100.0000 mg | ORAL_CAPSULE | Freq: Two times a day (BID) | ORAL | Status: DC
  Administered 2014-02-11 – 2014-02-20 (×17): 100 mg via ORAL
  Filled 2014-02-11 (×20): qty 1

## 2014-02-11 MED ORDER — OXYCODONE HCL 5 MG PO TABS
15.0000 mg | ORAL_TABLET | ORAL | Status: DC | PRN
  Administered 2014-02-11 – 2014-02-12 (×4): 15 mg via ORAL
  Filled 2014-02-11 (×4): qty 3

## 2014-02-11 MED ORDER — METHOCARBAMOL 500 MG PO TABS
500.0000 mg | ORAL_TABLET | Freq: Four times a day (QID) | ORAL | Status: DC | PRN
  Administered 2014-02-11 – 2014-02-16 (×18): 500 mg via ORAL
  Filled 2014-02-11 (×18): qty 1

## 2014-02-11 MED ORDER — DSS 100 MG PO CAPS: 100.0000 mg | ORAL_CAPSULE | Freq: Two times a day (BID) | ORAL | Status: DC

## 2014-02-11 MED ORDER — ACETAMINOPHEN 325 MG PO TABS
325.0000 mg | ORAL_TABLET | ORAL | Status: DC | PRN
  Administered 2014-02-12: 650 mg via ORAL
  Filled 2014-02-11: qty 2

## 2014-02-11 MED ORDER — PANTOPRAZOLE SODIUM 40 MG PO TBEC
40.0000 mg | DELAYED_RELEASE_TABLET | Freq: Two times a day (BID) | ORAL | Status: DC
  Administered 2014-02-11 – 2014-02-20 (×18): 40 mg via ORAL
  Filled 2014-02-11 (×17): qty 1

## 2014-02-11 MED ORDER — NICOTINE 7 MG/24HR TD PT24
7.0000 mg | MEDICATED_PATCH | Freq: Every day | TRANSDERMAL | Status: DC
  Administered 2014-02-12 – 2014-02-20 (×10): 7 mg via TRANSDERMAL
  Filled 2014-02-11 (×10): qty 1

## 2014-02-11 MED ORDER — DEXAMETHASONE 2 MG PO TABS
2.0000 mg | ORAL_TABLET | Freq: Two times a day (BID) | ORAL | Status: DC
  Administered 2014-02-11 – 2014-02-19 (×16): 2 mg via ORAL
  Filled 2014-02-11 (×18): qty 1

## 2014-02-11 MED ORDER — SORBITOL 70 % SOLN: 30.0000 mL | Freq: Every day | Status: DC | PRN

## 2014-02-11 MED ORDER — OXYCODONE HCL 15 MG PO TABS: 15.0000 mg | ORAL_TABLET | ORAL | Status: DC | PRN

## 2014-02-11 MED ORDER — ALUM & MAG HYDROXIDE-SIMETH 200-200-20 MG/5ML PO SUSP: 30.0000 mL | Freq: Four times a day (QID) | ORAL | Status: DC | PRN

## 2014-02-11 MED ORDER — ONDANSETRON HCL 4 MG/2ML IJ SOLN
4.0000 mg | Freq: Four times a day (QID) | INTRAMUSCULAR | Status: DC | PRN
  Filled 2014-02-11: qty 2

## 2014-02-11 NOTE — H&P (Signed)
Physical Medicine and Rehabilitation Admission H&P      Chief Complaint   Patient presents with   .  Knee Pain   .  Hand Pain   .  Numbness    : HPI: Gary Davis is a 58 y.o. right-handed male admitted 02/04/2014 with complaints of numbness and spasticity /weakness of both hands over the past 2 months. He reported not being able to grasp and hold objects due to these symptoms as well a couple of falls. . Cranial CT scan negative. X-rays and imaging revealed severe cervical stenosis from C3-C7 consistent with cervical central cord syndrome. Placed on intravenous Decadron. Underwent posterior cervical decompressive laminectomy and foraminotomies at C3-4, C4-5, C5-6, C6-7 with posterior cervical lateral mass screw fixation from C3-C7 02/09/2014 per Dr. Wynetta Emery. Hospital course pain management. Cervical collar at all times. Physical and occupational therapy evaluations completedcompleted . M.D. has requested physical medicine rehabilitation consult. Patient was admitted for comprehensive rehabilitation program   Now abole to use hands with feeding but unable to open containers  ROS Review of Systems   Constitutional:   Weakness in his hands   Musculoskeletal: Positive for falls and myalgias.   Neurological:   Numbness to fingertips   All other systems reviewed and are negative    Past Medical History   Diagnosis  Date   .  Arthritis     .  Cataract      Past Surgical History   Procedure  Laterality  Date   .  Arthroscopic repair acl  Right     .  Orif ankle fracture  Right     .  Finger fracture surgery           Right 4th Finger    History reviewed. No pertinent family history. Social History: reports that he has been smoking Cigarettes.  He has been smoking about 0.33 packs per day. He does not have any smokeless tobacco history on file. He reports that he drinks alcohol. He reports that he does not use illicit drugs. Allergies: No Known Allergies Medications Prior to  Admission   Medication  Sig  Dispense  Refill   .  gabapentin (NEURONTIN) 100 MG capsule  Take 1 capsule (100 mg total) by mouth 3 (three) times daily.   60 capsule   0   .  ibuprofen (ADVIL,MOTRIN) 200 MG tablet  Take 400 mg by mouth every 6 (six) hours as needed for moderate pain.             Home: Home Living Family/patient expects to be discharged to:: Private residence Living Arrangements: Alone Available Help at Discharge: Family;Available PRN/intermittently Type of Home: Apartment Home Access: Level entry Home Layout: One level Home Equipment: None Additional Comments: pt agreeable to rehab prior to d/c home to achieve safe mod I function    Functional History: Prior Function Level of Independence: Independent Gait / Transfers Assistance Needed: pt with freq falls prior to admission and difficulty holding onto things ADL's / Homemaking Assistance Needed: Difficulty with cooking due to decreased sensation/strength.  Did burn his hands several times.   Functional Status:   Mobility: Bed Mobility Overal bed mobility: Needs Assistance Bed Mobility: Rolling;Sidelying to Sit;Sit to Sidelying Rolling: Supervision Sidelying to sit: Min guard Sit to sidelying: Min guard General bed mobility comments: max v/c's for technique,  Transfers Overall transfer level: Needs assistance Equipment used: None (elevated surface height) Transfers: Sit to/from Stand Sit to Stand: Min assist;From elevated surface General transfer comment:  increased time, definite use of hands, minA to achieve full upright standing Ambulation/Gait Ambulation/Gait assistance: Min assist Ambulation Distance (Feet): 75 Feet Assistive device: Standard walker Gait Pattern/deviations: Step-through pattern;Decreased stride length;Ataxic Gait velocity: Decreased Gait velocity interpretation: Below normal speed for age/gender General Gait Details: attempted to ambulate with out RW, pt reaching for objects to hold  onto. Pt with bilat LE pain and staggering gait pattern, pt with improved stability and increased step length with RW   ADL: ADL Overall ADL's : Needs assistance/impaired Grooming: Wash/dry hands;Min guard;Standing Grooming Details (indicate cue type and reason): ataxic UE movement Toilet Transfer: Minimal assistance;Ambulation;Regular Toilet Functional mobility during ADLs: Minimal assistance General ADL Comments: Pt typing on cell phone on arrival. pt with most movement with thumbs. pt with weak lateral pinch. Pt requires extended time to use cell phone.    Cognition: Cognition Overall Cognitive Status: Impaired/Different from baseline Orientation Level: Oriented X4 Cognition Arousal/Alertness: Awake/alert Behavior During Therapy: WFL for tasks assessed/performed Overall Cognitive Status: Impaired/Different from baseline Area of Impairment: Awareness Awareness: Anticipatory General Comments: Pt without ccollar on . pt removed ccollar and states "its itchy"    Physical Exam: Blood pressure 118/71, pulse 60, temperature 97.5 F (36.4 C), temperature source Axillary, resp. rate 18, height 6\' 1"  (1.854 m), weight 71.986 kg (158 lb 11.2 oz), SpO2 97.00%. Physical Exam Constitutional: He is oriented to person, place, and time. He appears well-developed.   HENT:   Head: Normocephalic.   Eyes: EOM are normal.   Neck:  Hard cervical collar in place   Cardiovascular: Normal rate and regular rhythm.   Respiratory: Effort normal and breath sounds normal. No respiratory distress.   GI: Soft. Bowel sounds are normal. He exhibits no distension.  Neurological: He is alert and oriented to person, place, and time.   Skin:  Surgical site is dressed   motor strength is 3 minus bilateral deltoid biceps triceps and finger flexion finger extension wrist flexion and wrist extension.   Decreased sensation in bilateral C5 C6-C7 dermatomal distribution   4 minus in the hip flexors knee extensors  ankle dorsiflexors and plantar flexors bilaterally   Decreased sensation bilateral big toes    Results for orders placed during the hospital encounter of 02/04/14 (from the past 48 hour(s))   SURGICAL PCR SCREEN     Status: None     Collection Time      02/09/14  6:02 AM       Result  Value  Ref Range     MRSA, PCR  NEGATIVE   NEGATIVE     Staphylococcus aureus  NEGATIVE   NEGATIVE     Comment:                The Xpert SA Assay (FDA        approved for NASAL specimens        in patients over 13 years of age),        is one component of        a comprehensive surveillance        program.  Test performance has        been validated by [employer] for patients greater        than or equal to 46 year old.        It is not intended        to diagnose infection nor to        guide or  monitor treatment.    Dg Cervical Spine 1 View   02/09/2014   CLINICAL DATA:  Posterior cervical fusion  EXAM: DG C-ARM 61-120 MIN; DG CERVICAL SPINE - 1 VIEW  FLUOROSCOPY TIME:  9 seconds  COMPARISON:  None  FINDINGS: Posterior cervical fusion and decompression from C3 through C7. Bilateral posterior elements screws and vertical stabilizing rods are noted. Mild degenerative disc disease throughout the cervical spine.  Endotracheal tube and orogastric tubes are noted.  IMPRESSION: Posterior cervical fusion from C3 through C7.   Electronically Signed   By: Elige KoHetal  Patel   On: 02/09/2014 10:33    Dg C-arm 1-60 Min   02/09/2014   CLINICAL DATA:  Posterior cervical fusion  EXAM: DG C-ARM 61-120 MIN; DG CERVICAL SPINE - 1 VIEW  FLUOROSCOPY TIME:  9 seconds  COMPARISON:  None  FINDINGS: Posterior cervical fusion and decompression from C3 through C7. Bilateral posterior elements screws and vertical stabilizing rods are noted. Mild degenerative disc disease throughout the cervical spine.  Endotracheal tube and orogastric tubes are noted.  IMPRESSION: Posterior cervical fusion from C3 through C7.   Electronically  Signed   By: Elige KoHetal  Patel   On: 02/09/2014 10:33           Medical Problem List and Plan: 1. Functional deficits secondary to central cord syndrome status post C3-C7 posterior decompression and fusion. Cervical collar at all times 2.  DVT Prophylaxis/Anticoagulation: SCDs. Monitor for any signs of DVT 3. Pain Management: Neurontin 100 mg 3 times a day, Oxycodone and Robaxin as needed. Monitor with increased mobility 4. tobacco abuse. NicoDerm patch. Provide counseling 5. Neuropsych: This patient is capable of making decisions on his own behalf. 6. Skin/Wound Care: Routine skin care           Post Admission Physician Evaluation: Functional deficits secondary  to Patient is admitted to receive collaborative, interdisciplinary care between the physiatrist, rehab nursing staff, and therapy team. Patient's level of medical complexity and substantial therapy needs in context of that medical necessity cannot be provided at a lesser intensity of care such as a SNF. Patient has experienced substantial functional loss from his/her baseline which was documented above under the "Functional History" and "Functional Status" headings.  Judging by the patient's diagnosis, physical exam, and functional history, the patient has potential for functional progress which will result in measurable gains while on inpatient rehab.  These gains will be of substantial and practical use upon discharge  in facilitating mobility and self-care at the household level. Physiatrist will provide 24 hour management of medical needs as well as oversight of the therapy plan/treatment and provide guidance as appropriate regarding the interaction of the two. 24 hour rehab nursing will assist with bladder management, bowel management, safety, skin/wound care, disease management, medication administration, pain management and patient education  and help integrate therapy concepts, techniques,education, etc. PT will assess  and treat for/with: pre gait, gait training, endurance , safety, equipment, neuromuscular re education.   Goals are: Sup/Min A. OT will assess and treat for/with: ADLs, Cognitive perceptual skills, Neuromuscular re education, safety, endurance, equipment    Goals are: Sup/Min A.. SLP will assess and treat for/with: NA.  Goals are: NA. Case Management and Social Worker will assess and treat for psychological issues and discharge planning. Team conference will be held weekly to assess progress toward goals and to determine barriers to discharge. Patient will receive at least 3 hours of therapy per day at least 5 days per week. ELOS: 14-18days  Prognosis:  excellent  Erick Colace M.D. [facility] FAAPM&R (Sports Med, Neuromuscular Med) Diplomate Am Board of Electrodiagnostic Med  02/10/2014

## 2014-02-11 NOTE — PMR Pre-admission (Signed)
PMR Admission Coordinator Pre-Admission Assessment  Patient: Gary Davis is an 59 y.o., male MRN: [id number] DOB: May 22, 1956 Height: _0  (185.4 cm) Weight: 71.986 kg (158 lb 11.2 oz)              Insurance Information HMO:     PPO:      PCP:      IPA:      80/20:      OTHER: Point of Service Plan PRIMARY: [employer]      Policy#: [id number]      Subscriber: self CM Name: Sharion Dove, RN      Phone#: [phone]     Fax#: [phone] Approval given for seven days from [place], [place]. Updates due on 8-19 to [place] at the above fax. Pre-Cert#: [id number]      Employer: unemployed Benefits:  Phone #: [phone]     Name: Lonia Skinner. Date: 07-03-13     Deduct: $6250 (met $596.96)      Out of Pocket Max: [phone] (met [phone])      Life Max: unlimited CIR: $500 copay, pre-auth needed      SNF: $500 copay, pre-auth needed, 90 day visit limit Outpatient: 100% after deductible met     Co-Pay: $100 copay/visit. 75 visit limit for PT/OT; 35 visit limit for SLP. Home Health: 100% after deductible met      Co-Pay: $500 copay per visit; 120 visit limits/year DME: 50%     Co-Pay: 50% Providers: in network  Pt has started the process of applying for Disability and would appreciate any further assistance with this application process.  Emergency Contact Information Contact Information   Name Relation Home Work [place] Friend [place]   [phone]     Current Medical History  Patient Admitting Diagnosis: Central cord syndrome status post C3-C7 posterior decompression and fusion   History of Present Illness: Gary Davis is a 58 y.o. right-handed male admitted 02/04/2014 with complaints of numbness and spasticity /weakness of both hands over the past 2 weeks. He reported not being able to grasp and hold objects due to these symptoms as well a couple of falls. . Cranial CT scan negative. X-rays and  imaging revealed severe cervical stenosis from C3-C7 consistent with cervical central cord syndrome. Placed on intravenous Decadron. Underwent posterior cervical decompressive laminectomy and foraminotomies at C3-4, C4-5, C5-6, C6-7 with posterior cervical lateral mass screw fixation from C3-C7 02/09/2014 per Dr. Saintclair Halsted. Hospital course pain management. Cervical collar at all times. Physical and occupational therapy evaluations are pending. M.D. has requested physical medicine rehabilitation consult  Patient states that he has had increasing bilateral hand weakness over the last 2 months. 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 Has noted numbness in both hands as well as in the big toes of both feet. Postoperatively he is already noticed some improvement in numbness   Past Medical History  Past Medical History  Diagnosis Date  . Arthritis   . Cataract     Family History  family history is not on file.  Prior Rehab/Hospitalizations: previous outpt PT for R ACL repair   Current Medications  Current facility-administered medications:0.45 % NaCl with KCl 20 mEq / L infusion, , Intravenous, Continuous, Elaina Hoops, MD, Last Rate: 75 mL/hr at 02/09/14 1420;  0.9 %  sodium chloride infusion, , Intravenous, Continuous, Theressa Millard, MD, Last Rate: 100 mL/hr at 02/09/14 0025;  0.9 %  sodium chloride infusion, 250 mL, Intravenous, Continuous, Elaina Hoops, MD acetaminophen (TYLENOL) suppository 650 mg, 650 mg, Rectal, Q6H PRN, Theressa Millard, MD;  acetaminophen (TYLENOL) suppository 650 mg, 650 mg, Rectal, Q4H PRN, Elaina Hoops, MD;  acetaminophen (TYLENOL) tablet 650 mg, 650 mg, Oral, Q6H PRN, Theressa Millard, MD;  acetaminophen (TYLENOL) tablet 650 mg, 650 mg, Oral, Q4H PRN, Elaina Hoops, MD alum & mag hydroxide-simeth (MAALOX/MYLANTA) 200-200-20 MG/5ML suspension 30 mL, 30 mL, Oral, Q6H PRN, Theressa Millard, MD;  alum & mag hydroxide-simeth (MAALOX/MYLANTA) 200-200-20 MG/5ML suspension 30 mL, 30  mL, Oral, Q6H PRN, Elaina Hoops, MD;  cyclobenzaprine (FLEXERIL) tablet 10 mg, 10 mg, Oral, TID PRN, Elaina Hoops, MD, 10 mg at 02/11/14 0421;  dexamethasone (DECADRON) tablet 2 mg, 2 mg, Oral, Q12H, Kristeen Miss, MD, 2 mg at 02/11/14 0941 docusate sodium (COLACE) capsule 100 mg, 100 mg, Oral, BID, Elaina Hoops, MD, 100 mg at 02/11/14 9390;  gabapentin (NEURONTIN) capsule 100 mg, 100 mg, Oral, TID, Theressa Millard, MD, 100 mg at 02/11/14 3009;  HYDROmorphone (DILAUDID) injection 0.5 mg, 0.5 mg, Intravenous, Q10 min PRN, Midge Minium, MD, 0.5 mg at 02/09/14 1115 magnesium hydroxide (MILK OF MAGNESIA) suspension 30 mL, 30 mL, Oral, Daily PRN, Charlynne Cousins, MD, 30 mL at 02/08/14 1429;  menthol-cetylpyridinium (CEPACOL) lozenge 3 mg, 1 lozenge, Oral, PRN, Elaina Hoops, MD;  nicotine (NICODERM CQ - dosed in mg/24 hr) patch 7 mg, 7 mg, Transdermal, Daily, Theressa Millard, MD, 7 mg at 02/11/14 0945;  ondansetron (ZOFRAN) injection 4 mg, 4 mg, Intravenous, Q6H PRN, Theressa Millard, MD ondansetron (ZOFRAN) injection 4 mg, 4 mg, Intravenous, Q4H PRN, Elaina Hoops, MD;  ondansetron (ZOFRAN) tablet 4 mg, 4 mg, Oral, Q6H PRN, Theressa Millard, MD;  oxyCODONE (Oxy IR/ROXICODONE) immediate release tablet 15 mg, 15 mg, Oral, Q4H PRN, Charlynne Cousins, MD, 15 mg at 02/11/14 0531;  oxyCODONE-acetaminophen (PERCOCET/ROXICET) 5-325 MG per tablet 1-2 tablet, 1-2 tablet, Oral, Q4H PRN, Elaina Hoops, MD, 2 tablet at 02/11/14 0941 pantoprazole (PROTONIX) EC tablet 40 mg, 40 mg, Oral, BID, Charlynne Cousins, MD, 40 mg at 02/11/14 0941;  phenol (CHLORASEPTIC) mouth spray 1 spray, 1 spray, Mouth/Throat, PRN, Elaina Hoops, MD;  Derrill Memo ON 02/12/2014] pneumococcal 23 valent vaccine (PNU-IMMUNE) injection 0.5 mL, 0.5 mL, Intramuscular, Tomorrow-1000, Elaina Hoops, MD;  sodium chloride 0.9 % injection 3 mL, 3 mL, Intravenous, Q12H, Elaina Hoops, MD, 3 mL at 02/11/14 0946 sodium chloride 0.9 % injection 3 mL, 3 mL,  Intravenous, PRN, Elaina Hoops, MD;  zolpidem Lorrin Mais) tablet 5 mg, 5 mg, Oral, QHS PRN, Dianne Dun, NP, 5 mg at 02/10/14 2219  Patients Current Diet: General  Precautions / Restrictions Precautions Precautions: Fall Precaution Comments: central cord syndrome Cervical Brace: Hard collar;At all times Restrictions Weight Bearing Restrictions: No   Prior Activity Level Community (5-7x/wk): pt did get out but had more and more limitations as his hands got more numb. Uses the bus for transportation.  Home Assistive Devices / Equipment Home Assistive Devices/Equipment: None Home Equipment: None  Prior Functional Level Prior Function Level of Independence: Independent Gait / Transfers Assistance Needed: pt with freq falls prior to admission and difficulty holding onto things ADL's / Homemaking Assistance Needed: Difficulty with cooking due to decreased sensation/strength.  Did burn his hands several times.  Current Functional Level Cognition  Overall Cognitive Status: Impaired/Different from baseline Orientation Level: Oriented X4  General Comments: Pt without ccollar on . pt removed ccollar and states "its itchy"     Extremity Assessment (includes Sensation/Coordination)          ADLs  Overall ADL's : Needs assistance/impaired Eating/Feeding: Minimal assistance;Sitting Eating/Feeding Details (indicate cue type and reason): pt verbalized feeling like left digits "lock up" Question trigger finger ? Pt dropping items unable to sustain grasp Grooming: Min guard;Sitting;Wash/dry face;Wash/dry hands (cues for safety wtih RW) Grooming Details (indicate cue type and reason): ataxic UE movement Lower Body Dressing: Min guard;Sit to/from stand Lower Body Dressing Details (indicate cue type and reason): pt able to with extensive effort don underwear. Pt required restbreak due to pain Toilet Transfer: Minimal assistance;Ambulation;RW;Regular Toilet Functional mobility during  ADLs: Minimal assistance;Rolling walker General ADL Comments: Pt demonstrates UE ataxic fine motor deficits . Pt lives alone and needs (A) at current level. CIR could help progress patient to MOD I level    Mobility  Overal bed mobility: Needs Assistance Bed Mobility: Supine to Sit Rolling: Supervision Sidelying to sit: Min guard Sit to sidelying: Min guard General bed mobility comments: cues for cervical safety wtih mobility    Transfers  Overall transfer level: Needs assistance Equipment used: Rolling walker (2 wheeled) Transfers: Sit to/from Stand Sit to Stand: Min assist General transfer comment: increased time, definite use of hands, minA to achieve full upright standing    Ambulation / Gait / Stairs / Wheelchair Mobility  Ambulation/Gait Ambulation/Gait assistance: Museum/gallery curator (Feet): 75 Feet Assistive device: Standard walker Gait Pattern/deviations: Step-through pattern;Decreased stride length;Ataxic Gait velocity: Decreased Gait velocity interpretation: Below normal speed for age/gender General Gait Details: attempted to ambulate with out RW, pt reaching for objects to hold onto. Pt with bilat LE pain and staggering gait pattern, pt with improved stability and increased step length with RW    Posture / Balance Dynamic Sitting Balance Sitting balance - Comments: pt with good balance however due to cervical pain pt repeated altered sitting position    Special needs/care consideration BiPAP/CPAP no  CPM no  Continuous Drip IV no  Dialysis no          Life Vest no  Oxygen no  Special Bed no  Trach Size no  Wound Vac (area) no       Skin - current healing incision cervical region (drain recently removed)                              Bowel mgmt: last BM on 02-10-14 Bladder mgmt: currently using urinal Diabetic mgmt no   Previous Home Environment Living Arrangements: Alone Available Help at Discharge: Family;Available PRN/intermittently Type of Home:  Apartment Home Layout: One level Home Access: Level entry Bathroom Shower/Tub: Chiropodist: Standard Home Care Services: No Additional Comments: pt agreeable to rehab prior to d/c home to achieve safe mod I function  Discharge Living Setting Plans for Discharge Living Setting: Patient's home;Apartment Type of Home at Discharge: Apartment Discharge Home Layout: One level Discharge Home Access: Stairs to enter;Level entry (depends on how he enters his apartment. Pt has been walking down to lower parking lot which enables him to have a level entry way to his apartment (if not, he will have to negotiate several steps down to his apartment. 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 [employer], currently unemployed) Sport and exercise psychologist Information: brother Vallen Calabrese Anticipated Caregiver: brother can help occassionally Anticipated Caregiver's Contact Information: see above Ability/Limitations of Caregiver: brother does work and able to stop by around his work schedule Caregiver Availability: Intermittent (brother states he can help pt as needed) Discharge Plan Discussed with Primary Caregiver:  (discussed with pt's brother by phone on 8-12) Is Caregiver In Agreement with Plan?: Yes Does Caregiver/Family have Issues with Lodging/Transportation while Pt is in Rehab?: No  Goals/Additional Needs Patient/Family Goal for Rehab: Mod Ind with PT, Supervision with OT, NA for SLP Expected length of stay: 14-18 days Cultural Considerations: goes to [place] Needs: regular diet Equipment Needs: to be determined Pt/Family Agrees to  Admission and willing to participate: Yes Program Orientation Provided & Reviewed with Pt/Caregiver Including Roles  & Responsibilities: Yes   Decrease burden of Care through IP rehab admission: NA   Possible need for SNF placement upon discharge: not anticipated   Patient Condition: This patient's condition remains as documented in the consult dated 02-10-14, in which the Rehabilitation Physician determined and documented that the patient's condition is appropriate for intensive rehabilitative care in an inpatient rehabilitation facility. Will admit to inpatient rehab today.  Preadmission Screen Completed By:  Nanetta Batty, PT 02/11/2014 12:11 PM ______________________________________________________________________   Discussed status with Dr. Letta Pate on 02-11-14 at 12:11pm and received telephone approval for admission today.  Admission Coordinator:  Nanetta Batty, PT time 12:11pm/Date8-12-15

## 2014-02-11 NOTE — Progress Notes (Signed)
Patient is discharged from room 4N32 at this time and transferred to unit 4W. Alert and in stable condition. IV site d/c'ed. Hemovac d/c'd this am per MD's orders. Transferred via wheelchair with all belongings at side. Report given to nurse Natassia.

## 2014-02-11 NOTE — Progress Notes (Signed)
[facility]                                                                              Patient Demographics  Gary Davis, is a 58 y.o. male, DOB - 06-07-1956, GNF:[id number]  Admit date - 02/04/2014   Admitting Physician Ron Parker, MD  Outpatient Primary MD for the patient is No PCP Per Patient  LOS - 7   Chief Complaint  Patient presents with  . Knee Pain  . Hand Pain  . Numbness        Assessment & Plan  Bilateral arm weakness due to Cord compression syndrome  -Continue decadron, as per neurosurg.  -S/p Posterior cervical decompressive laminectomy and foraminotomies at C3-4, C4-5, C5-6, C6-7 with posterior cervical lateral mass screw fixation from C3-C7  -PT recommended CIR -Patient will be discharged to CIR today.   Tobacco use disorder:  -Smoking cessation counseling given  Arthritis  -Continue pain control   Code Status: Full  Family Communication: None at bedside  Disposition Plan: Will be discharged today by neurosurgery to CIR.  Time Spent in minutes   30 minutes  Lab Results  Component Value Date   PLT 137* 02/05/2014    Medications  Scheduled Meds: . dexamethasone  2 mg Oral Q12H  . docusate sodium  100 mg Oral BID  . gabapentin  100 mg Oral TID  . nicotine  7 mg Transdermal Daily  . pantoprazole  40 mg Oral BID  . [START ON 02/12/2014] pneumococcal 23 valent vaccine  0.5 mL Intramuscular Tomorrow-1000  . sodium chloride  3 mL Intravenous Q12H   Continuous Infusions: . 0.45 % NaCl with KCl 20 mEq / L 75 mL/hr at 02/09/14 1420  . sodium chloride 100 mL/hr at 02/09/14 0025  . sodium chloride     PRN Meds:.acetaminophen, acetaminophen, acetaminophen, acetaminophen, alum & mag hydroxide-simeth, alum & mag hydroxide-simeth, cyclobenzaprine, HYDROmorphone (DILAUDID) injection, magnesium hydroxide, menthol-cetylpyridinium, ondansetron (ZOFRAN) IV, ondansetron (ZOFRAN) IV, ondansetron, oxyCODONE, oxyCODONE-acetaminophen, phenol,  sodium chloride, zolpidem  Antibiotics    Anti-infectives   Start     Dose/Rate Route Frequency Ordered Stop   02/09/14 1200  ceFAZolin (ANCEF) IVPB 1 g/50 mL premix     1 g 100 mL/hr over 30 Minutes Intravenous Every 8 hours 02/09/14 1147 02/09/14 2213   02/09/14 0858  bacitracin 50,000 Units in sodium chloride irrigation 0.9 % 500 mL irrigation  Status:  Discontinued       As needed 02/09/14 0859 02/09/14 1008   02/09/14 0716  ceFAZolin (ANCEF) 2-3 GM-% IVPB SOLR    Comments:  Gary Davis   : cabinet override      02/09/14 0716 02/09/14 0740        Subjective:   Gary Davis seen and examined today.  Patient has no complaints today. He is looking forward to going to rehabilitation. He denied any dizziness, headache, chest pain, shortness of breath. Patient still has weakness.  Objective:   Filed Vitals:   02/10/14 2104 02/11/14 0200 02/11/14 0500 02/11/14 1036  BP: 109/64 108/60 116/68 135/72  Pulse: 83 69 70 66  Temp: 99 F (37.2 C) 99.4 F (37.4 C) 98 F (36.7 C) 98  F (36.7 C)  TempSrc: Oral Oral Oral Oral  Resp: 18 18 18 18   Height:      Weight:      SpO2: 98% 99% 98% 98%    Wt Readings from Last 3 Encounters:  02/04/14 71.986 kg (158 lb 11.2 oz)  02/04/14 71.986 kg (158 lb 11.2 oz)  10/07/13 81.647 kg (180 lb)     Intake/Output Summary (Last 24 hours) at 02/11/14 1447 Last data filed at 02/10/14 2100  Gross per 24 hour  Intake      0 ml  Output    400 ml  Net   -400 ml    Exam  General: Well developed, well nourished, NAD, appears stated age  HEENT: NCAT, PERRLA, EOMI, Anicteic Sclera, mucous membranes moist.   Neck: in collar  Cardiovascular: S1 S2 auscultated, no rubs, murmurs or gallops. Regular rate and rhythm.  Respiratory: Clear to auscultation bilaterally with equal chest rise  Abdomen: Soft, nontender, nondistended, + bowel sounds  Extremities: warm dry without cyanosis clubbing or edema  Neuro: AAOx3, weakness in upper and  lower ext bilaterally   Data Review   Micro Results Recent Results (from the past 240 hour(s))  SURGICAL PCR SCREEN     Status: None   Collection Time    02/09/14  6:02 AM      Result Value Ref Range Status   MRSA, PCR NEGATIVE  NEGATIVE Final   Staphylococcus aureus NEGATIVE  NEGATIVE Final   Comment:            The Xpert SA Assay (FDA     approved for NASAL specimens     in patients over 29 years of age),     is one component of     a comprehensive surveillance     program.  Test performance has     been validated by [employer] for patients greater     than or equal to 39 year old.     It is not intended     to diagnose infection nor to     guide or monitor treatment.    Radiology Reports Dg Cervical Spine 1 View  02/09/2014   CLINICAL DATA:  Posterior cervical fusion  EXAM: DG C-ARM 61-120 MIN; DG CERVICAL SPINE - 1 VIEW  FLUOROSCOPY TIME:  9 seconds  COMPARISON:  None  FINDINGS: Posterior cervical fusion and decompression from C3 through C7. Bilateral posterior elements screws and vertical stabilizing rods are noted. Mild degenerative disc disease throughout the cervical spine.  Endotracheal tube and orogastric tubes are noted.  IMPRESSION: Posterior cervical fusion from C3 through C7.   Electronically Signed   By: Elige Ko   On: 02/09/2014 10:33   Ct Head Wo Contrast  02/04/2014   CLINICAL DATA:  Pain numbness.  Recent fall.  EXAM: CT HEAD WITHOUT CONTRAST  TECHNIQUE: Contiguous axial images were obtained from the base of the skull through the vertex without intravenous contrast.  COMPARISON:  10/07/2013.  FINDINGS: No mass. No hydrocephalus. No hemorrhage. No acute bony abnormality. Visualized paranasal sinuses are clear. Postsurgical changes right maxillary sinus.  IMPRESSION: No acute abnormality.   Electronically Signed   By: Maisie Fus  Register   On: 02/04/2014 17:01   Mr Cervical Spine W Wo Contrast  02/04/2014   CLINICAL DATA:  58 year old male with bilateral hand  pain numbness and Severe weakness. Initial encounter.  EXAM: MRI CERVICAL SPINE WITHOUT AND WITH CONTRAST  TECHNIQUE: Multiplanar and multiecho  pulse sequences of the cervical spine, to include the craniocervical junction and cervicothoracic junction, were obtained according to standard protocol without and with intravenous contrast.  CONTRAST:  17mL MULTIHANCE GADOBENATE DIMEGLUMINE 529 MG/ML IV SOLN  COMPARISON:  Head CT without contrast 1628 hr the same day and earlier.  FINDINGS: Straightening of cervical lordosis. Multilevel degenerative endplate marrow signal changes. Mild marrow edema and enhancement involving the posterior inferior C4 vertebra, appears to be degenerative in nature. No acute osseous abnormality suspected.  No other abnormal enhancement identified.  Cervicomedullary junction is within normal limits.  Severe multifactorial, degenerative spinal stenosis associated with Severe spinal cord compression from C3-C4 to C5-C6. Associated abnormal spinal cord signal at these levels most apparent at the C4 and C5 levels (series 300, image 7 and series 500, image 6).  Normal visualized upper thoracic spinal cord. See additional degenerative details below.  Negative paraspinal soft tissues.  There is a degree of congenital spinal canal narrowing in the cervical spine diffusely.  C2-C3: Mild overall spinal stenosis with mild broad-based disc protrusion. Mild to moderate bilateral C3 foraminal stenosis.  C3-C4: Severe spinal stenosis with thecal sac reduced to 4 mm. This is related to bulky circumferential disc osteophyte complex and ligament flavum hypertrophy superimposed on congenital canal narrowing. Severe bilateral foraminal stenosis.  C4-C5: Severe spinal stenosis, multifactorial with circumferential disc osteophyte complex, broad-based posterior component of disc. Thecal sac reduced to 4 mm. Severe bilateral foraminal stenosis.  C5-C6: Moderate to severe spinal stenosis. Disc osteophyte complex with  broad-based posterior disc and ligament flavum hypertrophy superimposed on congenital canal narrowing. Thecal sac reduced to 5-6 mm. Severe bilateral foraminal stenosis.  C6-C7: Circumferential disc osteophyte complex. Mild spinal stenosis with minimal spinal cord mass effect. Moderate bilateral foraminal stenosis.  C7-T1: Moderate facet and uncovertebral hypertrophy on the left. Mild to moderate left C8 foraminal stenosis.  No upper thoracic spinal stenosis.  IMPRESSION: 1. Multilevel cervical spinal cord compression due to severe combined degenerative and congenital stenosis. 2. The level C3-C4 through C5-C6 are affected, with spinal cord signal abnormality which probably represents a combination of myelomalacia and edema in this clinical setting. 3. Additional degenerative widespread cervical neural foraminal stenosis, mostly severe. Critical Value/emergent results were called by telephone at the time of interpretation on 02/04/2014 at 7:20 pm to Dr. Chaney MallingAVID YAO , who verbally acknowledged these results.   Electronically Signed   By: Augusto GambleLee  Hall M.D.   On: 02/04/2014 19:22   Dg C-arm 1-60 Min  02/09/2014   CLINICAL DATA:  Posterior cervical fusion  EXAM: DG C-ARM 61-120 MIN; DG CERVICAL SPINE - 1 VIEW  FLUOROSCOPY TIME:  9 seconds  COMPARISON:  None  FINDINGS: Posterior cervical fusion and decompression from C3 through C7. Bilateral posterior elements screws and vertical stabilizing rods are noted. Mild degenerative disc disease throughout the cervical spine.  Endotracheal tube and orogastric tubes are noted.  IMPRESSION: Posterior cervical fusion from C3 through C7.   Electronically Signed   By: Elige KoHetal  Patel   On: 02/09/2014 10:33    CBC  Recent Labs Lab 02/04/14 1549 02/05/14 0721  WBC 4.5 2.3*  HGB 15.2 14.2  HCT 42.6 40.9  PLT 139* 137*  MCV 97.0 97.6  MCH 34.6* 33.9  MCHC 35.7 34.7  RDW 12.5 12.2  LYMPHSABS 1.4  --   MONOABS 0.6  --   EOSABS 0.1  --   BASOSABS 0.0  --     Chemistries    Recent Labs Lab 02/04/14 1549 02/05/14 0721  NA 138 137  K 4.1 4.0  CL 98 99  CO2 23 24  GLUCOSE 103* 176*  BUN 18 14  CREATININE 0.84 0.69  CALCIUM 9.1 8.7  AST 26  --   ALT 12  --   ALKPHOS 81  --   BILITOT 0.9  --    ------------------------------------------------------------------------------------------------------------------ estimated creatinine clearance is 100 ml/min (by C-G formula based on Cr of 0.69). ------------------------------------------------------------------------------------------------------------------ No results found for this basename: HGBA1C,  in the last 72 hours ------------------------------------------------------------------------------------------------------------------ No results found for this basename: CHOL, HDL, LDLCALC, TRIG, CHOLHDL, LDLDIRECT,  in the last 72 hours ------------------------------------------------------------------------------------------------------------------ No results found for this basename: TSH, T4TOTAL, FREET3, T3FREE, THYROIDAB,  in the last 72 hours ------------------------------------------------------------------------------------------------------------------ No results found for this basename: VITAMINB12, FOLATE, FERRITIN, TIBC, IRON, RETICCTPCT,  in the last 72 hours  Coagulation profile  Recent Labs Lab 02/04/14 2312  INR 1.07    No results found for this basename: DDIMER,  in the last 72 hours  Cardiac Enzymes No results found for this basename: CK, CKMB, TROPONINI, MYOGLOBIN,  in the last 168 hours ------------------------------------------------------------------------------------------------------------------ No components found with this basename: POCBNP,     Gary Davis D.O. on 02/11/2014 at 2:47 PM  Between 7am to 7pm - Pager - [phone]  After 7pm go to [email or web address] - password [email or web address]  And look for the night coverage person covering for me after hours  [facility]  Group Office  [phone]

## 2014-02-11 NOTE — Progress Notes (Addendum)
Rehab admissions - We did [facility]. I received medical clearance from Dr. Lovell SheehanJenkins and will admit pt to inpatient rehab later today. I completed admission paperwork with pt and spoke with pt's brother by phone to update him of the plan for inpatient rehab.   Steward DroneBrenda with case management and Irving Burtonmily, social worker are both aware of the plan for inpatient rehab. RN also updated.   Please call me with any questions. Thanks.  Juliann MuleJanine Leaira Fullam, PT Rehabilitation Admissions Coordinator [phone]

## 2014-02-11 NOTE — Progress Notes (Signed)
Patient arrived onto [address] via wheelchair assisted by nurse tech with belongings into [phone]. Patient oriented to room, and given information packet. All questions answered, and safety plan, and agreement discussed. Patient verbalized understanding. Will continue to monitor. 

## 2014-02-11 NOTE — Discharge Summary (Signed)
Physician Discharge Summary  Patient ID: Gary Davis MRN: [id number] DOB/AGE: 60/12/1955 60 y.o.  Admit date: 02/04/2014 Discharge date: 02/11/2014  Admission Diagnoses: Cervical myelopathy, cervical spinal stenosis, cervical spondylosis, cervicalgia, cervical radiculopathy  Discharge Diagnoses: The same Principal Problem:   Cord compression syndrome Active Problems:   Bilateral arm weakness   Tobacco use disorder   Arthritis   Myelopathy, spondylogenic, cervical   Discharged Condition: fair  Hospital Course: The patient was admitted on at 5:15 with a cervical myelopathy. Dr. Wynetta Emerycram performed a C3-4, C4-5, C5-6 and C6-7 posterior decompression, instrumentation, and fusion on the patient on 02/09/2014.  The patient's postoperative course was unremarkable. He was seen by PT, OT and the rehabilitation team. Arrangements were made for him to be transferred to inpatient rehabilitation. He was transferred on 02/11/14. The patient was given oral and written discharge instructions. All his questions were answered.  Consults: PT, OT, rehabilitation Significant Diagnostic Studies: Cervical MRI Treatments: C3-4, C4-5, C5-6 and C6-7 posterior cervical decompression, instrumentation, and fusion. Discharge Exam: Blood pressure 135/72, pulse 66, temperature 98 F (36.7 C), temperature source Oral, resp. rate 18, height 6\' 1"  (1.854 m), weight 71.986 kg (158 lb 11.2 oz), SpO2 98.00%. Patient is alert and pleasant. He is moving all 4 extremities. His dressing is clean and dry.  Disposition: Rehabilitation  Discharge Instructions   Call MD for:  difficulty breathing, headache or visual disturbances    Complete by:  As directed      Call MD for:  extreme fatigue    Complete by:  As directed      Call MD for:  hives    Complete by:  As directed      Call MD for:  persistant dizziness or light-headedness    Complete by:  As directed      Call MD for:  persistant nausea and vomiting     Complete by:  As directed      Call MD for:  redness, tenderness, or signs of infection (pain, swelling, redness, odor or green/yellow discharge around incision site)    Complete by:  As directed      Call MD for:  severe uncontrolled pain    Complete by:  As directed      Call MD for:  temperature >100.4    Complete by:  As directed      Diet - low sodium heart healthy    Complete by:  As directed      Discharge instructions    Complete by:  As directed   Call [phone] for a followup appointment. Take a stool softener while you are using pain medications.     Driving Restrictions    Complete by:  As directed   Do not drive for 2 weeks.     Increase activity slowly    Complete by:  As directed      Lifting restrictions    Complete by:  As directed   Do not lift more than 5 pounds. No excessive bending or twisting.     May shower / Bathe    Complete by:  As directed   He may shower after the pain she is removed 3 days after surgery. Leave the incision alone.     Remove dressing in 24 hours    Complete by:  As directed             Medication List    STOP taking these medications       ibuprofen 200  MG tablet  Commonly known as:  ADVIL,MOTRIN      TAKE these medications       cyclobenzaprine 10 MG tablet  Commonly known as:  FLEXERIL  Take 1 tablet (10 mg total) by mouth 3 (three) times daily as needed for muscle spasms.     DSS 100 MG Caps  Take 100 mg by mouth 2 (two) times daily.     gabapentin 100 MG capsule  Commonly known as:  NEURONTIN  Take 1 capsule (100 mg total) by mouth 3 (three) times daily.     oxyCODONE 15 MG immediate release tablet  Commonly known as:  ROXICODONE  Take 1 tablet (15 mg total) by mouth every 4 (four) hours as needed for moderate pain.         SignedCristi Loron 02/11/2014, 12:49 PM

## 2014-02-11 NOTE — Progress Notes (Signed)
Subjective: Patient reports Patient reports neck pain is minimal. Patient notes pains feel much less numb and strength appears to be returning he notes his dexterity is improving. Still has some residual numbness in legs. He is much more able to do personal care items. Looking forward to rehabilitation.  Objective: Vital signs in last 24 hours: Temp:  [97.5 F (36.4 C)-99.4 F (37.4 C)] 98 F (36.7 C) (08/12 0500) Pulse Rate:  [60-83] 70 (08/12 0500) Resp:  [18] 18 (08/12 0500) BP: (108-124)/(60-83) 116/68 mmHg (08/12 0500) SpO2:  [97 %-99 %] 98 % (08/12 0500)  Intake/Output from previous day: 08/11 0701 - 08/12 0700 In: -  Out: 400 [Urine:350; Drains:50] Intake/Output this shift:    Strength in hands reveals 4/5 finger motion with 3+ out of 5 grips. Fine motor movements are still lacking. Dressing appears dry minimal drainage and grenade. Patient is ambulatory with use of walker.  Lab Results: No results found for this basename: WBC, HGB, HCT, PLT,  in the last 72 hours BMET No results found for this basename: NA, K, CL, CO2, GLUCOSE, BUN, CREATININE, CALCIUM,  in the last 72 hours  Studies/Results: Dg Cervical Spine 1 View  02/09/2014   CLINICAL DATA:  Posterior cervical fusion  EXAM: DG C-ARM 61-120 MIN; DG CERVICAL SPINE - 1 VIEW  FLUOROSCOPY TIME:  9 seconds  COMPARISON:  None  FINDINGS: Posterior cervical fusion and decompression from C3 through C7. Bilateral posterior elements screws and vertical stabilizing rods are noted. Mild degenerative disc disease throughout the cervical spine.  Endotracheal tube and orogastric tubes are noted.  IMPRESSION: Posterior cervical fusion from C3 through C7.   Electronically Signed   By: Elige KoHetal  Patel   On: 02/09/2014 10:33   Dg C-arm 1-60 Min  02/09/2014   CLINICAL DATA:  Posterior cervical fusion  EXAM: DG C-ARM 61-120 MIN; DG CERVICAL SPINE - 1 VIEW  FLUOROSCOPY TIME:  9 seconds  COMPARISON:  None  FINDINGS: Posterior cervical fusion and  decompression from C3 through C7. Bilateral posterior elements screws and vertical stabilizing rods are noted. Mild degenerative disc disease throughout the cervical spine.  Endotracheal tube and orogastric tubes are noted.  IMPRESSION: Posterior cervical fusion from C3 through C7.   Electronically Signed   By: Elige KoHetal  Patel   On: 02/09/2014 10:33    Assessment/Plan: Improving status post cervical decompression.  LOS: 7 days  plan dressing change and removal of drain. Excellent rehabilitation candidate.   Ayjah Show J 02/11/2014, 8:49 AM

## 2014-02-12 ENCOUNTER — Inpatient Hospital Stay (HOSPITAL_COMMUNITY): Payer: No Typology Code available for payment source | Admitting: Occupational Therapy

## 2014-02-12 ENCOUNTER — Inpatient Hospital Stay (HOSPITAL_COMMUNITY): Payer: No Typology Code available for payment source | Admitting: *Deleted

## 2014-02-12 ENCOUNTER — Inpatient Hospital Stay: Payer: No Typology Code available for payment source | Admitting: Internal Medicine

## 2014-02-12 DIAGNOSIS — G8250 Quadriplegia, unspecified: Secondary | ICD-10-CM

## 2014-02-12 LAB — CBC WITH DIFFERENTIAL/PLATELET
BASOS ABS: 0 10*3/uL (ref 0.0–0.1)
Basophils Relative: 0 % (ref 0–1)
Eosinophils Absolute: 0.1 10*3/uL (ref 0.0–0.7)
Eosinophils Relative: 1 % (ref 0–5)
HEMATOCRIT: 36.1 % — AB (ref 39.0–52.0)
HEMOGLOBIN: 12.4 g/dL — AB (ref 13.0–17.0)
LYMPHS PCT: 17 % (ref 12–46)
Lymphs Abs: 1.4 10*3/uL (ref 0.7–4.0)
MCH: 34.7 pg — ABNORMAL HIGH (ref 26.0–34.0)
MCHC: 34.3 g/dL (ref 30.0–36.0)
MCV: 101.1 fL — ABNORMAL HIGH (ref 78.0–100.0)
MONO ABS: 1.8 10*3/uL — AB (ref 0.1–1.0)
MONOS PCT: 22 % — AB (ref 3–12)
NEUTROS ABS: 4.9 10*3/uL (ref 1.7–7.7)
Neutrophils Relative %: 60 % (ref 43–77)
Platelets: 151 10*3/uL (ref 150–400)
RBC: 3.57 MIL/uL — ABNORMAL LOW (ref 4.22–5.81)
RDW: 12.9 % (ref 11.5–15.5)
WBC: 8.2 10*3/uL (ref 4.0–10.5)

## 2014-02-12 LAB — COMPREHENSIVE METABOLIC PANEL
ALT: 10 U/L (ref 0–53)
AST: 12 U/L (ref 0–37)
Albumin: 2.9 g/dL — ABNORMAL LOW (ref 3.5–5.2)
Alkaline Phosphatase: 55 U/L (ref 39–117)
Anion gap: 11 (ref 5–15)
BUN: 15 mg/dL (ref 6–23)
CHLORIDE: 98 meq/L (ref 96–112)
CO2: 28 mEq/L (ref 19–32)
CREATININE: 0.74 mg/dL (ref 0.50–1.35)
Calcium: 8.9 mg/dL (ref 8.4–10.5)
GFR calc Af Amer: >90 mL/min (ref >90)
GFR calc non Af Amer: >90 mL/min (ref >90)
Glucose, Bld: 125 mg/dL — ABNORMAL HIGH (ref 70–99)
Potassium: 4.3 mEq/L (ref 3.7–5.3)
Sodium: 137 mEq/L (ref 137–147)
Total Protein: 6.2 g/dL (ref 6.0–8.3)

## 2014-02-12 MED ORDER — HYDROCERIN EX CREA
TOPICAL_CREAM | Freq: Two times a day (BID) | CUTANEOUS | Status: DC
  Administered 2014-02-12: 22:00:00 via TOPICAL
  Administered 2014-02-12: 1 via TOPICAL
  Administered 2014-02-13 – 2014-02-20 (×14): via TOPICAL
  Filled 2014-02-12: qty 113

## 2014-02-12 MED ORDER — CAMPHOR-MENTHOL 0.5-0.5 % EX LOTN
TOPICAL_LOTION | Freq: Two times a day (BID) | CUTANEOUS | Status: DC
  Administered 2014-02-12 – 2014-02-14 (×6): via TOPICAL
  Administered 2014-02-15: 1 via TOPICAL
  Administered 2014-02-15 – 2014-02-20 (×9): via TOPICAL
  Filled 2014-02-12: qty 222

## 2014-02-12 MED ORDER — OXYCODONE HCL 5 MG PO TABS
20.0000 mg | ORAL_TABLET | ORAL | Status: DC | PRN
  Administered 2014-02-12 – 2014-02-20 (×47): 20 mg via ORAL
  Filled 2014-02-12 (×47): qty 4

## 2014-02-12 NOTE — Plan of Care (Signed)
Problem: RH PAIN MANAGEMENT Goal: RH STG PAIN MANAGED AT OR BELOW PT'S PAIN GOAL Less or equal to 4  Outcome: Not Progressing Patient states pain gets as low as 8 with interventions

## 2014-02-12 NOTE — Progress Notes (Signed)
[facility] Individual Statement of Services  Patient Name:  Gary Davis  Date:  02/12/2014  Welcome to the [facility]patient [facility].  Our goal is to provide you with an individualized program based on your diagnosis and situation, designed to meet your specific needs.  With this comprehensive rehabilitation program, you will be expected to participate in at least 3 hours of rehabilitation therapies Monday-Friday, with modified therapy programming on the weekends.  Your rehabilitation program will include the following services:  Physical Therapy (PT), Occupational Therapy (OT), 24 hour per day rehabilitation nursing, Case Management (Social Worker), Rehabilitation Medicine, Nutrition Services and Pharmacy Services  Weekly team conferences will be held on Tuesdays to discuss your progress.  Your Social Worker will talk with you frequently to get your input and to update you on team discussions.  Team conferences with you and your family in attendance may also be held.  Expected length of stay:  10-12 days  Overall anticipated outcome:  Modified Independent   Depending on your progress and recovery, your program may change. Your Social Worker will coordinate services and will keep you informed of any changes. Your Social Worker's name and contact numbers are listed  below.  The following services may also be recommended but are not provided by the [facility]:   Driving Evaluations  Home Health Rehabiltiation Services  Outpatient Rehabilitation Services  Vocational Rehabilitation   Arrangements will be made to provide these services after discharge if needed.  Arrangements include referral to agencies that provide these services.  Your insurance has been verified to be:  [employer] Your primary doctor is:  None at this time.  We will be working on getting you set up with someone before you are discharged.  Pertinent information will be  shared with your doctor and your insurance company.  Social Worker:  Staci AcostaJenny Lisbet Busker, LCSW  [phone] or [phone]  Information discussed with and copy given to patient by: Elvera LennoxPrevatt, Leona Pressly Capps, 02/12/2014, 12:10 PM

## 2014-02-12 NOTE — Progress Notes (Signed)
Subjective/Complaints: Right sided neck pain inhibited sleep and therapy, hand pain as well  Review of Systems - Negative except constipation and as above  Objective: Vital Signs: Blood pressure 106/60, pulse 64, temperature 98 F (36.7 C), temperature source Oral, resp. rate 18, height _0  (1.854 m), weight 76.3 kg (168 lb 3.4 oz), SpO2 97.00%. No results found. Results for orders placed during the hospital encounter of 02/11/14 (from the past 72 hour(s))  CBC WITH DIFFERENTIAL     Status: Abnormal   Collection Time    02/12/14  5:35 AM      Result Value Ref Range   WBC 8.2  4.0 - 10.5 K/uL   RBC 3.57 (*) 4.22 - 5.81 MIL/uL   Hemoglobin 12.4 (*) 13.0 - 17.0 g/dL   HCT 36.1 (*) 39.0 - 52.0 %   MCV 101.1 (*) 78.0 - 100.0 fL   MCH 34.7 (*) 26.0 - 34.0 pg   MCHC 34.3  30.0 - 36.0 g/dL   RDW 12.9  11.5 - 15.5 %   Platelets 151  150 - 400 K/uL   Neutrophils Relative % 60  43 - 77 %   Neutro Abs 4.9  1.7 - 7.7 K/uL   Lymphocytes Relative 17  12 - 46 %   Lymphs Abs 1.4  0.7 - 4.0 K/uL   Monocytes Relative 22 (*) 3 - 12 %   Monocytes Absolute 1.8 (*) 0.1 - 1.0 K/uL   Eosinophils Relative 1  0 - 5 %   Eosinophils Absolute 0.1  0.0 - 0.7 K/uL   Basophils Relative 0  0 - 1 %   Basophils Absolute 0.0  0.0 - 0.1 K/uL  COMPREHENSIVE METABOLIC PANEL     Status: Abnormal   Collection Time    02/12/14  5:35 AM      Result Value Ref Range   Sodium 137  137 - 147 mEq/L   Potassium 4.3  3.7 - 5.3 mEq/L   Chloride 98  96 - 112 mEq/L   CO2 28  19 - 32 mEq/L   Glucose, Bld 125 (*) 70 - 99 mg/dL   BUN 15  6 - 23 mg/dL   Creatinine, Ser 0.74  0.50 - 1.35 mg/dL   Calcium 8.9  8.4 - 10.5 mg/dL   Total Protein 6.2  6.0 - 8.3 g/dL   Albumin 2.9 (*) 3.5 - 5.2 g/dL   AST 12  0 - 37 U/L   ALT 10  0 - 53 U/L   Alkaline Phosphatase 55  39 - 117 U/L   Total Bilirubin <0.2 (*) 0.3 - 1.2 mg/dL   GFR calc non Af Amer >90  >90 mL/min   GFR calc Af Amer >90  >90 mL/min   Comment: (NOTE)     The  eGFR has been calculated using the CKD EPI equation.     This calculation has not been validated in all clinical situations.     eGFR's persistently <90 mL/min signify possible Chronic Kidney     Disease.   Anion gap 11  5 - 15     HEENT: normal and poor dentition Cardio: RRR and no murmur Resp: CTA B/L and unlabored GI: BS positive and NT, ND Extremity:  Pulses positive and No Edema Skin:   Intact and Wound C/D/I and post cervical Neuro: Alert/Oriented Musc/Skel:  Other bilateral intrinsic hand deformities Gen NAD motor strength is 3 minus bilateral deltoid biceps triceps and finger flexion finger extension wrist flexion and wrist  extension.  Decreased sensation in bilateral C5 C6-C7 dermatomal distribution  4 minus in the hip flexors knee extensors ankle dorsiflexors and plantar flexors bilaterally  Decreased sensation bilateral big toes   Assessment/Plan: 1. Functional deficits secondary to Central cord syndrome, s/p C3-C7 PSF which require 3+ hours per day of interdisciplinary therapy in a comprehensive inpatient rehab setting. Physiatrist is providing close team supervision and 24 hour management of active medical problems listed below. Physiatrist and rehab team continue to assess barriers to discharge/monitor patient progress toward functional and medical goals. FIM:                   Comprehension Comprehension Mode: Auditory Comprehension: 7-Follows complex conversation/direction: With no assist  Expression Expression Mode: Verbal Expression: 7-Expresses complex ideas: With no assist  Social Interaction Social Interaction: 7-Interacts appropriately with others - No medications needed.  Problem Solving Problem Solving: 7-Solves complex problems: Recognizes & self-corrects  Memory Memory: 7-Complete Independence: No helper  Medical Problem List and Plan:  1. Functional deficits secondary to central cord syndrome status post C3-C7 posterior decompression  and fusion. Cervical collar at all times  2. DVT Prophylaxis/Anticoagulation: SCDs. Monitor for any signs of DVT  3. Pain Management: Neurontin 100 mg 3 times a day,  Increase Oxycodone and Robaxin as needed. Monitor with increased mobility  4. tobacco abuse. NicoDerm patch. Provide counseling  5. Neuropsych: This patient is capable of making decisions on his own behalf.  6. Skin/Wound Care: Routine skin care   LOS (Days) 1 A FACE TO FACE EVALUATION WAS PERFORMED  Gary Davis 02/12/2014, 9:16 AM

## 2014-02-12 NOTE — Progress Notes (Signed)
[facility] Admission Coordinator Signed Physical Medicine and Rehabilitation PMR Pre-admission Service date: 02/11/2014 10:23 AM  Related encounter: Admission (Discharged) from 02/04/2014 in [facility]   PMR Admission Coordinator Pre-Admission Assessment  Patient: Gary Davis is an 58 y.o., male  MRN: [id number]  DOB: [birth year]  Height: 6' 1"  (185.4 cm)  Weight: 71.986 kg (158 lb 11.2 oz)  Insurance Information  HMO: PPO: PCP: IPA: 80/20: OTHER: Point of Service Plan  PRIMARY: [place] Policy#: [id number] Subscriber: self  CM Name: Sharion Dove, RN Phone#: [phone] Fax#: [phone]  Approval given for seven days from [employer], [place]. Updates due on 8-19 to [place] at the above fax.  Pre-Cert#: [id number] Employer: unemployed  Benefits: Phone #: [phone] Name: Lonia Skinner. Date: 07-03-13 Deduct: $6250 (met $596.96) Out of Pocket Max: [phone] (met [phone]) Life Max: unlimited  CIR: $500 copay, pre-auth needed SNF: $500 copay, pre-auth needed, 90 day visit limit  Outpatient: 100% after deductible met Co-Pay: $100 copay/visit. 75 visit limit for PT/OT; 35 visit limit for SLP.  Home Health: 100% after deductible met Co-Pay: $500 copay per visit; 120 visit limits/year  DME: 50% Co-Pay: 50%  Providers: in network  Pt has started the process of applying for Disability and would appreciate any further assistance with this application process.   Emergency Contact Information    Contact Information     Name  Relation  Home  Work  [place]  Friend  [place]    [phone]        Current Medical History  Patient Admitting Diagnosis: Central cord syndrome status post C3-C7 posterior decompression and fusion  History of Present Illness: Gary Davis is a 58 y.o. right-handed male admitted 02/04/2014 with complaints of numbness and  spasticity /weakness of both hands over the past 2 weeks. He reported not being able to grasp and hold objects due to these symptoms as well a couple of falls. . Cranial CT scan negative. X-rays and imaging revealed severe cervical stenosis from C3-C7 consistent with cervical central cord syndrome. Placed on intravenous Decadron. Underwent posterior cervical decompressive laminectomy and foraminotomies at C3-4, C4-5, C5-6, C6-7 with posterior cervical lateral mass screw fixation from C3-C7 02/09/2014 per Dr. Saintclair Halsted. Hospital course pain management. Cervical collar at all times. Physical and occupational therapy evaluations are pending. M.D. has requested physical medicine rehabilitation consult  Patient states that he has had increasing bilateral hand weakness over the last 2 months. 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 Has noted numbness in both hands as well as in the big toes of both feet.  Postoperatively he is already noticed some improvement in numbness  Past Medical History    Past Medical History    Diagnosis  Date    .  Arthritis     .  Cataract      Family History  family history is not on file.  Prior Rehab/Hospitalizations: previous outpt PT for R ACL repair  Current Medications  Current facility-administered medications:0.45 % NaCl with KCl 20 mEq / L infusion, , Intravenous, Continuous, Elaina Hoops, MD, Last Rate: 75 mL/hr at 02/09/14 1420; 0.9 % sodium chloride infusion, , Intravenous, Continuous, Theressa Millard, MD, Last Rate: 100 mL/hr at 02/09/14 0025; 0.9 % sodium chloride infusion, 250 mL, Intravenous, Continuous, Elaina Hoops, MD  acetaminophen (TYLENOL) suppository 650 mg, 650 mg, Rectal,  Q6H PRN, Theressa Millard, MD; acetaminophen (TYLENOL) suppository 650 mg, 650 mg, Rectal, Q4H PRN, Elaina Hoops, MD; acetaminophen (TYLENOL) tablet 650 mg, 650 mg, Oral, Q6H PRN, Theressa Millard, MD; acetaminophen (TYLENOL) tablet 650 mg, 650 mg, Oral, Q4H PRN, Elaina Hoops, MD  alum & mag  hydroxide-simeth (MAALOX/MYLANTA) 200-200-20 MG/5ML suspension 30 mL, 30 mL, Oral, Q6H PRN, Theressa Millard, MD; alum & mag hydroxide-simeth (MAALOX/MYLANTA) 200-200-20 MG/5ML suspension 30 mL, 30 mL, Oral, Q6H PRN, Elaina Hoops, MD; cyclobenzaprine (FLEXERIL) tablet 10 mg, 10 mg, Oral, TID PRN, Elaina Hoops, MD, 10 mg at 02/11/14 0421; dexamethasone (DECADRON) tablet 2 mg, 2 mg, Oral, Q12H, Kristeen Miss, MD, 2 mg at 02/11/14 0941  docusate sodium (COLACE) capsule 100 mg, 100 mg, Oral, BID, Elaina Hoops, MD, 100 mg at 02/11/14 9191; gabapentin (NEURONTIN) capsule 100 mg, 100 mg, Oral, TID, Theressa Millard, MD, 100 mg at 02/11/14 6606; HYDROmorphone (DILAUDID) injection 0.5 mg, 0.5 mg, Intravenous, Q10 min PRN, Midge Minium, MD, 0.5 mg at 02/09/14 1115  magnesium hydroxide (MILK OF MAGNESIA) suspension 30 mL, 30 mL, Oral, Daily PRN, Charlynne Cousins, MD, 30 mL at 02/08/14 1429; menthol-cetylpyridinium (CEPACOL) lozenge 3 mg, 1 lozenge, Oral, PRN, Elaina Hoops, MD; nicotine (NICODERM CQ - dosed in mg/24 hr) patch 7 mg, 7 mg, Transdermal, Daily, Theressa Millard, MD, 7 mg at 02/11/14 0945; ondansetron (ZOFRAN) injection 4 mg, 4 mg, Intravenous, Q6H PRN, Theressa Millard, MD  ondansetron (ZOFRAN) injection 4 mg, 4 mg, Intravenous, Q4H PRN, Elaina Hoops, MD; ondansetron (ZOFRAN) tablet 4 mg, 4 mg, Oral, Q6H PRN, Theressa Millard, MD; oxyCODONE (Oxy IR/ROXICODONE) immediate release tablet 15 mg, 15 mg, Oral, Q4H PRN, Charlynne Cousins, MD, 15 mg at 02/11/14 0531; oxyCODONE-acetaminophen (PERCOCET/ROXICET) 5-325 MG per tablet 1-2 tablet, 1-2 tablet, Oral, Q4H PRN, Elaina Hoops, MD, 2 tablet at 02/11/14 0941  pantoprazole (PROTONIX) EC tablet 40 mg, 40 mg, Oral, BID, Charlynne Cousins, MD, 40 mg at 02/11/14 0941; phenol (CHLORASEPTIC) mouth spray 1 spray, 1 spray, Mouth/Throat, PRN, Elaina Hoops, MD; Derrill Memo ON 02/12/2014] pneumococcal 23 valent vaccine (PNU-IMMUNE) injection 0.5 mL, 0.5 mL,  Intramuscular, Tomorrow-1000, Elaina Hoops, MD; sodium chloride 0.9 % injection 3 mL, 3 mL, Intravenous, Q12H, Elaina Hoops, MD, 3 mL at 02/11/14 0946  sodium chloride 0.9 % injection 3 mL, 3 mL, Intravenous, PRN, Elaina Hoops, MD; zolpidem Lorrin Mais) tablet 5 mg, 5 mg, Oral, QHS PRN, Dianne Dun, NP, 5 mg at 02/10/14 2219  Patients Current Diet: General  Precautions / Restrictions  Precautions  Precautions: Fall  Precaution Comments: central cord syndrome  Cervical Brace: Hard collar;At all times  Restrictions  Weight Bearing Restrictions: No  Prior Activity Level  Community (5-7x/wk): pt did get out but had more and more limitations as his hands got more numb. Uses the bus for transportation.  Home Assistive Devices / Equipment  Home Assistive Devices/Equipment: None  Home Equipment: None  Prior Functional Level  Prior Function  Level of Independence: Independent  Gait / Transfers Assistance Needed: pt with freq falls prior to admission and difficulty holding onto things  ADL's / Homemaking Assistance Needed: Difficulty with cooking due to decreased sensation/strength. Did burn his hands several times.  Current Functional Level    Cognition  Overall Cognitive Status: Impaired/Different from baseline  Orientation Level: Oriented X4  General Comments: Pt without ccollar on . pt removed ccollar and states "its itchy"  Extremity Assessment  (includes Sensation/Coordination)      ADLs  Overall ADL's : Needs assistance/impaired  Eating/Feeding: Minimal assistance;Sitting  Eating/Feeding Details (indicate cue type and reason): pt verbalized feeling like left digits "lock up" Question trigger finger ? Pt dropping items unable to sustain grasp  Grooming: Min guard;Sitting;Wash/dry face;Wash/dry hands (cues for safety wtih RW)  Grooming Details (indicate cue type and reason): ataxic UE movement  Lower Body Dressing: Min guard;Sit to/from stand  Lower Body Dressing Details (indicate  cue type and reason): pt able to with extensive effort don underwear. Pt required restbreak due to pain  Toilet Transfer: Minimal assistance;Ambulation;RW;Regular Toilet  Functional mobility during ADLs: Minimal assistance;Rolling walker  General ADL Comments: Pt demonstrates UE ataxic fine motor deficits . Pt lives alone and needs (A) at current level. CIR could help progress patient to MOD I level    Mobility  Overal bed mobility: Needs Assistance  Bed Mobility: Supine to Sit  Rolling: Supervision  Sidelying to sit: Min guard  Sit to sidelying: Min guard  General bed mobility comments: cues for cervical safety wtih mobility    Transfers  Overall transfer level: Needs assistance  Equipment used: Rolling walker (2 wheeled)  Transfers: Sit to/from Stand  Sit to Stand: Min assist  General transfer comment: increased time, definite use of hands, minA to achieve full upright standing    Ambulation / Gait / Stairs / Wheelchair Mobility  Ambulation/Gait  Ambulation/Gait assistance: Fish farm manager (Feet): 75 Feet  Assistive device: Standard walker  Gait Pattern/deviations: Step-through pattern;Decreased stride length;Ataxic  Gait velocity: Decreased  Gait velocity interpretation: Below normal speed for age/gender  General Gait Details: attempted to ambulate with out RW, pt reaching for objects to hold onto. Pt with bilat LE pain and staggering gait pattern, pt with improved stability and increased step length with RW    Posture / Balance  Dynamic Sitting Balance  Sitting balance - Comments: pt with good balance however due to cervical pain pt repeated altered sitting position    Special needs/care consideration  BiPAP/CPAP no  CPM no  Continuous Drip IV no  Dialysis no  Life Vest no  Oxygen no  Special Bed no  Trach Size no  Wound Vac (area) no  Skin - current healing incision cervical region (drain recently removed)  Bowel mgmt: last BM on 02-10-14  Bladder mgmt:  currently using urinal  Diabetic mgmt no    Previous Home Environment  Living Arrangements: Alone  Available Help at Discharge: Family;Available PRN/intermittently  Type of Home: Apartment  Home Layout: One level  Home Access: Level entry  Bathroom Shower/Tub: Administrator, Civil Service: Standard  Home Care Services: No  Additional Comments: pt agreeable to rehab prior to d/c home to achieve safe mod I function  Discharge Living Setting  Plans for Discharge Living Setting: Patient's home;Apartment  Type of Home at Discharge: Apartment  Discharge Home Layout: One level  Discharge Home Access: Stairs to enter;Level entry (depends on how he enters his apartment. Pt has been walking down to lower parking lot which enables him to have a level entry way to his apartment (if not, he will have to negotiate several steps down to his apartment. 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 [employer], currently unemployed)  Sport and exercise psychologist Information: brother Kannan Proia  Anticipated Caregiver: brother can help occassionally  Anticipated Caregiver's Contact Information: see above  Ability/Limitations of Caregiver: brother does work and able to stop by around his work schedule  Caregiver Availability: Intermittent (brother states he can help pt as needed)  Discharge Plan Discussed with Primary Caregiver: (discussed with pt's brother by phone on 8-12)  Is Caregiver In Agreement with Plan?: Yes  Does Caregiver/Family have Issues with Lodging/Transportation while Pt is in Rehab?: No  Goals/Additional Needs  Patient/Family Goal for Rehab: Mod Ind with PT,  Supervision with OT, NA for SLP  Expected length of stay: 14-18 days  Cultural Considerations: goes to [place] Needs: regular diet  Equipment Needs: to be determined  Pt/Family Agrees to Admission and willing to participate: Yes  Program Orientation Provided & Reviewed with Pt/Caregiver Including Roles & Responsibilities: Yes  Decrease burden of Care through IP rehab admission: NA  Possible need for SNF placement upon discharge: not anticipated  Patient Condition: This patient's condition remains as documented in the consult dated 02-10-14, in which the Rehabilitation Physician determined and documented that the patient's condition is appropriate for intensive rehabilitative care in an inpatient rehabilitation facility. Will admit to inpatient rehab today.  Preadmission Screen Completed By: Nanetta Batty, PT 02/11/2014 12:11 PM  ______________________________________________________________________  Discussed status with Dr. Letta Pate on 02-11-14 at 12:11pm and received telephone approval for admission today.  Admission Coordinator: Nanetta Batty, PT time 12:11pm/Date8-12-15    Cosigned by: Charlett Blake, MD [02/11/2014 1:37 PM]

## 2014-02-12 NOTE — Progress Notes (Signed)
Patient information reviewed and entered into [facility] System by Becky Takoda Siedlecki, covering PPS coordinator. Information including medical coding and functional independence measure will be reviewed and updated through discharge.   

## 2014-02-12 NOTE — Progress Notes (Signed)
Admission assessment with Marissa NestlePam Love, PA. Patient has very dry skin to bilateral feet, Patient complains of heart and itching to back, no visible rash noted on assessment, patient with moisture to buttocks and peri area, no breakdown noted, skin intact. Pam Love, Pa to order Eucerin cream to feet BID, Sarna lotion to the back as needed, antifungal powder to buttocks and peri area for moisture control BID.Patient aware of plan of care. Roberts-VonCannon, Zainah Steven Elon JesterMichele

## 2014-02-12 NOTE — Evaluation (Signed)
Physical Therapy Assessment and Plan  Patient Details  Name: Gary Davis MRN: [id number] Date of Birth: September 16, 1955  PT Diagnosis: Abnormal posture, Abnormality of gait, Coordination disorder, Hypotonia, Impaired sensation, Muscle weakness, Osteoarthritis, Pain in neck and Post laminectomy syndrom Rehab Potential: Good ELOS: 10-12 days   Today's Date: 02/12/2014 Time: 0800-0903 Time Calculation (min): 63 min  Problem List:  Patient Active Problem List   Diagnosis Date Noted  . Central cord syndrome 02/11/2014  . Myelopathy, spondylogenic, cervical 02/09/2014  . Bilateral arm weakness 02/04/2014  . Cord compression syndrome 02/04/2014  . Tobacco use disorder 02/04/2014  . Arthritis 02/04/2014    Past Medical History:  Past Medical History  Diagnosis Date  . Arthritis   . Cataract    Past Surgical History:  Past Surgical History  Procedure Laterality Date  . Arthroscopic repair acl Right   . Orif ankle fracture Right   . Finger fracture surgery      Right 4th Finger  . Posterior cervical fusion/foraminotomy N/A 02/09/2014    Procedure: POSTERIOR CERVICAL FUSION/FORAMINOTOMY LEVEL 4  Cervical three to seven decompression and fusion with lateral mass screws;  Surgeon: Elaina Hoops, MD;  Location: [place] [facility];  Service: Neurosurgery;  Laterality: N/A;    Assessment & Plan Clinical Impression: Gary Davis is a 58 y.o. right-handed male admitted 02/04/2014 with complaints of numbness and spasticity /weakness of both hands over the past 2 months. He reported not being able to grasp and hold objects due to these symptoms as well a couple of falls. . Cranial CT scan negative. X-rays and imaging revealed severe cervical stenosis from C3-C7 consistent with cervical central cord syndrome. Placed on intravenous Decadron. Underwent posterior cervical decompressive laminectomy and foraminotomies at C3-4, C4-5, C5-6, C6-7 with posterior cervical lateral mass screw fixation from  C3-C7 02/09/2014 per Dr. Saintclair Halsted. Hospital course pain management. Cervical collar at all times. Physical and occupational therapy evaluations completedcompleted . M.D. has requested physical medicine rehabilitation consult. Patient was admitted for comprehensive rehabilitation program. Patient transferred to CIR on 02/11/2014 .   Patient currently requires min with mobility secondary to muscle weakness, decreased cardiorespiratoy endurance, impaired timing and sequencing, abnormal tone, unbalanced muscle activation and decreased coordination, na and decreased standing balance, decreased postural control and decreased balance strategies.  Prior to hospitalization, patient was independent  with mobility and lived with Alone in a [place] home.  Home access is  Level entry.  Patient will benefit from skilled PT intervention to maximize safe functional mobility, minimize fall risk and decrease caregiver burden for planned discharge home with intermittent assist.  Anticipate patient will benefit from follow up [place] vs OPPT (TBD) at discharge.  PT - End of Session Activity Tolerance: Tolerates 30+ min activity with multiple rests Endurance Deficit: Yes Endurance Deficit Description: fatigues quickly, requires frequent rest breaks PT Assessment Rehab Potential: Good Barriers to Discharge: Decreased caregiver support Barriers to Discharge Comments: Patient lives alone PT Patient demonstrates impairments in the following area(s): Balance;Endurance;Motor;Pain;Safety;Sensory PT Transfers Functional Problem(s): Bed Mobility;Bed to Chair;Car;Furniture;Floor PT Locomotion Functional Problem(s): Ambulation;Wheelchair Mobility;Stairs PT Plan PT Intensity: Minimum of 1-2 x/day ,45 to 90 minutes PT Frequency: 5 out of 7 days PT Duration Estimated Length of Stay: 10-12 days PT Treatment/Interventions: Ambulation/gait training;Disease management/prevention;Pain management;Stair training;Wheelchair  propulsion/positioning;Therapeutic Activities;Patient/family education;DME/adaptive equipment instruction;Balance/vestibular training;Psychosocial support;Therapeutic Exercise;UE/LE Strength taining/ROM;Skin care/wound management;Functional mobility training;Community reintegration;Discharge planning;Neuromuscular re-education;Splinting/orthotics;UE/LE Coordination activities PT Transfers Anticipated Outcome(s): mod I PT Locomotion Anticipated Outcome(s): mod I household, supervision community PT Recommendation Recommendations for  Other Services: Neuropsych consult Follow Up Recommendations: Home health PT;Outpatient PT (HHPT vs. OPPT TBD) Patient destination: Home Equipment Recommended: To be determined Equipment Details: Patient does not own any DME; recommendation TBD upon discharge  Skilled Therapeutic Intervention Skilled therapeutic intervention initiated after completion of evaluation. Discussed falls risk, safety within room, and focus of therapy during stay. Discussed possible LOS, goals, and f/u therapy. Gait training with R HHA and minA 30' x2.  PT Evaluation Precautions/Restrictions Precautions Precautions: Fall;Cervical Precaution Comments: central cord syndrome Required Braces or Orthoses: Cervical Brace Cervical Brace: Hard collar;At all times Restrictions Weight Bearing Restrictions: No General Chart Reviewed: Yes Family/Caregiver Present: No  Pain Pain Assessment Pain Assessment: 0-10 Pain Score: 10-Worst pain ever Pain Type: Acute pain;Surgical pain Pain Location: Neck Pain Orientation: Posterior Pain Descriptors / Indicators: Stabbing;Shooting;Aching;Sore Pain Onset: On-going Pain Intervention(s): RN made aware;Repositioned;Ambulation/increased activity Multiple Pain Sites: No Home Living/Prior Functioning Home Living Available Help at Discharge: Family;Available PRN/intermittently (brother and neighbor) Type of Home: Apartment Home Access: Level entry Home  Layout: One level Additional Comments: Mailbox and trash disposal on second and third floor-discussed with patient having brother or friend assist with these things upon discharge  Lives With: Alone Prior Function Level of Independence: Independent with gait;Independent with transfers  Able to Take Stairs?: Yes (reports needing increased time) Driving: No (uses public transportation) Vocation: Unemployed 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 Patient reports black spots in visual field since admission. Praxis WFL.  Cognition Overall Cognitive Status: Within Functional Limits for tasks assessed Arousal/Alertness: Awake/alert Orientation Level: Oriented X4 Sensation Sensation Light Touch: Impaired Detail Light Touch Impaired Details: Impaired LLE Proprioception: Appears Intact Additional Comments: Impaired sensation L foot L4-5 dermatome Coordination Gross Motor Movements are Fluid and Coordinated: No Fine Motor Movements are Fluid and Coordinated: No Coordination and Movement Description: decreased speed and accuracy of movements in B UE Motor  Motor Motor: Abnormal tone;Abnormal postural alignment and control Motor - Skilled Clinical Observations: decreased motor control of B UEs secondary to central cord syndrome  Mobility Bed Mobility Bed Mobility: Supine to Sit Supine to Sit: HOB elevated;With rails;5: Supervision Supine to Sit Details: Verbal cues for precautions/safety Transfers Transfers: Yes Sit to Stand: 4: Min assist;From bed;With armrests;With upper extremity assist;From chair/3-in-1 Sit to Stand Details: Verbal cues for sequencing;Verbal cues for technique;Verbal cues for precautions/safety;Manual facilitation for weight shifting Stand to Sit: 4: Min assist;With armrests;With upper extremity assist;To chair/3-in-1;To toilet Stand to Sit Details (indicate cue type and reason): Verbal cues for sequencing;Manual  facilitation for weight shifting;Verbal cues for precautions/safety;Verbal cues for technique Stand Pivot Transfers: 4: Min assist;With armrests Stand Pivot Transfer Details: Verbal cues for sequencing;Manual facilitation for weight shifting;Verbal cues for precautions/safety;Verbal cues for technique Locomotion  Ambulation Ambulation: Yes Ambulation/Gait Assistance: 4: Min assist Ambulation Distance (Feet): 102 Feet Assistive device: Rolling walker Ambulation/Gait Assistance Details: Verbal cues for safe use of DME/AE;Verbal cues for gait pattern;Tactile cues for posture Gait Gait: Yes Gait Pattern: Impaired Gait Pattern: Step-through pattern;Decreased stride length;Trunk flexed Stairs / Additional Locomotion Stairs: Yes Stairs Assistance: 4: Min assist Stairs Assistance Details: Verbal cues for gait pattern;Verbal cues for precautions/safety;Verbal cues for sequencing;Manual facilitation for weight shifting Stair Management Technique: Two rails;Alternating pattern;Forwards Number of Stairs: 5 Height of Stairs: 6 Wheelchair Mobility Wheelchair Mobility: Yes Wheelchair Assistance: 1: +1 Total assist (due to recliner wheelchair) Wheelchair Parts Management: Needs assistance Distance: 150  Trunk/Postural Assessment  Cervical Assessment Cervical Assessment: Exceptions to [facility] (s/p posterior decompression,  in cervical brace) Thoracic Assessment Thoracic Assessment: Within Functional Limits Lumbar Assessment Lumbar Assessment: Within Functional Limits Postural Control Postural Control: Deficits on evaluation Righting Reactions: delayed Protective Responses: delayed  Balance Balance Balance Assessed: Yes Static Sitting Balance Static Sitting - Balance Support: Bilateral upper extremity supported;No upper extremity supported;Feet supported Static Sitting - Level of Assistance: 6: Modified independent (Device/Increase time) Static Standing Balance Static Standing - Balance Support:  Bilateral upper extremity supported;No upper extremity supported;During functional activity Static Standing - Level of Assistance: 4: Min assist Extremity Assessment   RLE Assessment RLE Assessment: Within Functional Limits LLE Assessment LLE Assessment: Within Functional Limits  FIM:  FIM - Bed/Chair Transfer Bed/Chair Transfer Assistive Devices: HOB elevated;Bed rails;Arm rests Bed/Chair Transfer: 5: Supine > Sit: Supervision (verbal cues/safety issues);4: Bed > Chair or W/C: Min A (steadying Pt. > 75%);4: Chair or W/C > Bed: Min A (steadying Pt. > 75%) FIM - Locomotion: Wheelchair Distance: 150 Locomotion: Wheelchair: 1: Total Assistance/staff pushes wheelchair (Pt<25%) (totalA due to recliner wheelchair) FIM - Locomotion: Ambulation Locomotion: Ambulation Assistive Devices: Administrator Ambulation/Gait Assistance: 4: Min assist Locomotion: Ambulation: 2: Travels 50 - 149 ft with minimal assistance (Pt.>75%) FIM - Locomotion: Stairs Locomotion: Scientist, physiological: Hand rail - 2 Locomotion: Stairs: 2: Up and Down 4 - 11 stairs with minimal assistance (Pt.>75%)   Refer to Care Plan for Long Term Goals  Recommendations for other services: Neuropsych  Discharge Criteria: Patient will be discharged from PT if patient refuses treatment 3 consecutive times without medical reason, if treatment goals not met, if there is a change in medical status, if patient makes no progress towards goals or if patient is discharged from hospital.  The above assessment, treatment plan, treatment alternatives and goals were discussed and mutually agreed upon: by patient  Lillia Abed. Nakiyah Beverley, PT, DPT 02/12/2014, 10:17 AM

## 2014-02-12 NOTE — Progress Notes (Signed)
Erick ColaceAndrew E Kirsteins, MD Physician Signed Physical Medicine and Rehabilitation Consult Note Service date: 02/09/2014 12:23 PM  Related encounter: [facility]           Physical Medicine and Rehabilitation Consult Reason for Consult: Central cord syndrome/myelopathy Referring Physician: Dr. Wynetta Emeryram     HPI: Gary RidingMichael D Davis is a 58 y.o. right-handed male admitted 02/04/2014 with complaints of numbness and spasticity /weakness of both hands over the past 2 weeks. He reported not being able to grasp and hold objects due to these symptoms as well a couple of falls. . Cranial CT scan negative. X-rays and imaging revealed severe cervical stenosis from C3-C7 consistent with cervical central cord syndrome. Placed on intravenous Decadron. Underwent posterior cervical decompressive laminectomy and foraminotomies at C3-4, C4-5, C5-6, C6-7 with posterior cervical lateral mass screw fixation from C3-C7 02/09/2014 per Dr. Wynetta Emeryram. Hospital course pain management. Cervical collar at all times. Physical and occupational therapy evaluations are pending. M.D. has requested physical medicine rehabilitation consult   Patient states that he has had increasing bilateral hand weakness over the last 2 months. 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 Has noted numbness in both hands as well as in the big toes of both feet. Postoperatively he is already noticed some improvement in numbness   Review of Systems  Constitutional:        Weakness in his hands  Musculoskeletal: Positive for falls and myalgias.  Neurological:        Numbness to fingertips  All other systems reviewed and are negative. Past Medical History   Diagnosis  Date   .  Arthritis     .  Cataract      Past Surgical History   Procedure  Laterality  Date   .  Arthroscopic repair acl  Right     .  Orif ankle fracture  Right     .  Finger fracture surgery           Right  4th Finger    History reviewed. No pertinent family history. Social History: reports that he has been smoking Cigarettes.  He has been smoking about 0.33 packs per day. He does not have any smokeless tobacco history on file. He reports that he drinks alcohol. He reports that he does not use illicit drugs. Allergies: No Known Allergies Medications Prior to Admission   Medication  Sig  Dispense  Refill   .  gabapentin (NEURONTIN) 100 MG capsule  Take 1 capsule (100 mg total) by mouth 3 (three) times daily.   60 capsule   0   .  ibuprofen (ADVIL,MOTRIN) 200 MG tablet  Take 400 mg by mouth every 6 (six) hours as needed for moderate pain.             Home: Home Living Family/patient expects to be discharged to:: Private residence Living Arrangements: Alone Available Help at Discharge: Family;Available PRN/intermittently Type of Home: Apartment Home Access: Level entry Home Layout: One level Home Equipment: None   Functional History: Prior Function Level of Independence: Needs assistance Gait / Transfers Assistance Needed: Patient holding onto furniture and walls for ambulation. ADL's / Homemaking Assistance Needed: Difficulty with cooking due to decreased sensation/strength.  Did burn his hands several times. Functional Status:   Mobility: Bed Mobility Overal bed mobility: Modified Independent Transfers Overall transfer level: Modified independent Equipment used: None Ambulation/Gait Ambulation/Gait assistance: Supervision Ambulation Distance (Feet): 24 Feet Assistive device: None (Pushes  IV pole for support) Gait Pattern/deviations: Step-through pattern;Decreased step length - right;Decreased step length - left;Decreased stride length;Shuffle Gait velocity: Decreased Gait velocity interpretation: Below normal speed for age/gender General Gait Details: Patient with slow, shuffling gait.  Unsteady, holding IV pole for support.   ADL: ADL Overall ADL's : Needs  assistance/impaired Grooming: Wash/dry hands;Min guard;Standing Grooming Details (indicate cue type and reason): ataxic UE movement Toilet Transfer: Minimal assistance;Ambulation;Regular Toilet Functional mobility during ADLs: Minimal assistance General ADL Comments: Pt typing on cell phone on arrival. pt with most movement with thumbs. pt with weak lateral pinch. Pt requires extended time to use cell phone.    Cognition: Cognition Overall Cognitive Status: Impaired/Different from baseline Orientation Level: Oriented X4 Cognition Arousal/Alertness: Awake/alert Behavior During Therapy: WFL for tasks assessed/performed Overall Cognitive Status: Impaired/Different from baseline Area of Impairment: Awareness Awareness: Anticipatory General Comments: Pt without ccollar on . pt removed ccollar and states "its itchy"    Blood pressure 148/76, pulse 57, temperature 98.1 F (36.7 C), temperature source Oral, resp. rate 13, height 6\' 1"  (1.854 m), weight 71.986 kg (158 lb 11.2 oz), SpO2 100.00%. Physical Exam  Vitals reviewed. Constitutional: He is oriented to person, place, and time. He appears well-developed.  HENT:   Head: Normocephalic.  Eyes: EOM are normal.  Neck:  Hard cervical collar in place  Cardiovascular: Normal rate and regular rhythm.   Respiratory: Effort normal and breath sounds normal. No respiratory distress.  GI: Soft. Bowel sounds are normal. He exhibits no distension.  Neurological: He is alert and oriented to person, place, and time.  Skin:  Surgical site is dressed  motor strength is 3 minus bilateral deltoid biceps triceps and finger flexion finger extension wrist flexion and wrist extension. Decreased sensation in bilateral C5 C6-C7 dermatomal distribution 4 minus in the hip flexors knee extensors ankle dorsiflexors and plantar flexors bilaterally Decreased sensation bilateral big toes    Results for orders placed during the hospital encounter of 02/04/14  (from the past 24 hour(s))   SURGICAL PCR SCREEN     Status: None     Collection Time      02/09/14  6:02 AM       Result  Value  Ref Range     MRSA, PCR  NEGATIVE   NEGATIVE     Staphylococcus aureus  NEGATIVE   NEGATIVE    Dg Cervical Spine 1 View   02/09/2014   CLINICAL DATA:  Posterior cervical fusion  EXAM: DG C-ARM 61-120 MIN; DG CERVICAL SPINE - 1 VIEW  FLUOROSCOPY TIME:  9 seconds  COMPARISON:  None  FINDINGS: Posterior cervical fusion and decompression from C3 through C7. Bilateral posterior elements screws and vertical stabilizing rods are noted. Mild degenerative disc disease throughout the cervical spine.  Endotracheal tube and orogastric tubes are noted.  IMPRESSION: Posterior cervical fusion from C3 through C7.   Electronically Signed   By: Elige Ko   On: 02/09/2014 10:33    Dg C-arm 1-60 Min   02/09/2014   CLINICAL DATA:  Posterior cervical fusion  EXAM: DG C-ARM 61-120 MIN; DG CERVICAL SPINE - 1 VIEW  FLUOROSCOPY TIME:  9 seconds  COMPARISON:  None  FINDINGS: Posterior cervical fusion and decompression from C3 through C7. Bilateral posterior elements screws and vertical stabilizing rods are noted. Mild degenerative disc disease throughout the cervical spine.  Endotracheal tube and orogastric tubes are noted.  IMPRESSION: Posterior cervical fusion from C3 through C7.   Electronically Signed   By: Alan Ripper  Patel   On: 02/09/2014 10:33     Assessment/Plan: Diagnosis: Central cord syndrome status post C3-C7 posterior decompression and fusion postoperative day #1 Does the need for close, 24 hr/day medical supervision in concert with the patient's rehab needs make it unreasonable for this patient to be served in a less intensive setting? Yes Co-Morbidities requiring supervision/potential complications: Pain control, balance disorder, fine motor deficits in both hands, quadriparesis Due to bladder management, bowel management, safety, skin/wound care, disease management, medication  administration, pain management and patient education, does the patient require 24 hr/day rehab nursing? Yes Does the patient require coordinated care of a physician, rehab nurse, PT (1-2 hrs/day, 5 days/week) and OT (1-2 hrs/day, 5 days/week) to address physical and functional deficits in the context of the above medical diagnosis(es)? Potentially Addressing deficits in the following areas: balance, endurance, locomotion, strength, transferring, bowel/bladder control, bathing, dressing, feeding, grooming and toileting Can the patient actively participate in an intensive therapy program of at least 3 hrs of therapy per day at least 5 days per week? Potentially The potential for patient to make measurable gains while on inpatient rehab is excellent Anticipated functional outcomes upon discharge from inpatient rehab are modified independent  with PT, supervision with OT, n/a with SLP. Estimated rehab length of stay to reach the above functional goals is: 14-18 days Does the patient have adequate social supports to accommodate these discharge functional goals? Yes and Potentially Anticipated D/C setting: Home Anticipated post D/C treatments: HH therapy Overall Rehab/Functional Prognosis: excellent   RECOMMENDATIONS: This patient's condition is appropriate for continued rehabilitative care in the following setting: CIR Patient has agreed to participate in recommended program. Potentially Note that insurance prior authorization may be required for reimbursement for recommended care.   Comment: Should be ready once drainage cath is removed and postoperative pain is controlled       02/09/2014    Revision History...     Date/Time User Action   02/10/2014 12:54 PM Erick Colace, MD Sign   02/10/2014 5:42 AM Charlton Amor, PA-C [place]  View Details Report   Routing History...     Date/Time From To Method   02/10/2014 12:54 PM Erick Colace, MD Erick Colace, MD In Basket    02/10/2014 12:54 PM Erick Colace, MD No Pcp Per Patient In Basket

## 2014-02-12 NOTE — Evaluation (Signed)
Occupational Therapy Assessment and Plan  Patient Details  Name: Gary Davis MRN: [id number] Date of Birth: 01-19-1956  OT Diagnosis: abnormal posture, acute pain, ataxia, disturbance of vision and muscle weakness (generalized) Rehab Potential: Rehab Potential: Good ELOS: 10-12 days   Today's Date: 02/12/2014 Time: 1102-1205 Time Calculation (min): 63 min  Problem List:  Patient Active Problem List   Diagnosis Date Noted  . Central cord syndrome 02/11/2014  . Myelopathy, spondylogenic, cervical 02/09/2014  . Bilateral arm weakness 02/04/2014  . Cord compression syndrome 02/04/2014  . Tobacco use disorder 02/04/2014  . Arthritis 02/04/2014    Past Medical History:  Past Medical History  Diagnosis Date  . Arthritis   . Cataract    Past Surgical History:  Past Surgical History  Procedure Laterality Date  . Arthroscopic repair acl Right   . Orif ankle fracture Right   . Finger fracture surgery      Right 4th Finger  . Posterior cervical fusion/foraminotomy N/A 02/09/2014    Procedure: POSTERIOR CERVICAL FUSION/FORAMINOTOMY LEVEL 4  Cervical three to seven decompression and fusion with lateral mass screws;  Surgeon: Elaina Hoops, MD;  Location: [facility];  Service: Neurosurgery;  Laterality: N/A;    Assessment & Plan Clinical Impression: Patient is a 58 y.o. right-handed male admitted 02/04/2014 with complaints of numbness and spasticity /weakness of both hands over the past 2 months. He reported not being able to grasp and hold objects due to these symptoms as well a couple of falls. . Cranial CT scan negative. X-rays and imaging revealed severe cervical stenosis from C3-C7 consistent with cervical central cord syndrome. Placed on intravenous Decadron. Underwent posterior cervical decompressive laminectomy and foraminotomies at C3-4, C4-5, C5-6, C6-7 with posterior cervical lateral mass screw fixation from C3-C7 02/09/2014 per Dr. Saintclair Halsted. Hospital course pain management.  Cervical collar at all times. Physical and occupational therapy evaluations completedcompleted . M.D. has requested physical medicine rehabilitation consult.   Patient transferred to CIR on 02/11/2014 .    Patient currently requires max with basic self-care skills secondary to muscle weakness, decreased cardiorespiratoy endurance, impaired timing and sequencing, abnormal tone, unbalanced muscle activation and decreased coordination and decreased standing balance, decreased postural control and decreased balance strategies.  Prior to hospitalization, patient could complete ADLs with independent , last few months pt with decreasing coordination in BUE requiring increased time to complete tasks.  Patient will benefit from skilled intervention to increase independence with basic self-care skills and increase level of independence with iADL prior to discharge home independently.  Anticipate patient will require intermittent supervision and follow up home health and follow up outpatient (TBD depending on transportation).  OT - End of Session Activity Tolerance: Tolerates 30+ min activity with multiple rests Endurance Deficit: Yes Endurance Deficit Description: fatigues quickly, requires frequent rest breaks OT Assessment Rehab Potential: Good OT Patient demonstrates impairments in the following area(s): Balance;Endurance;Motor;Pain;Safety;Sensory OT Basic ADL's Functional Problem(s): Eating;Grooming;Bathing;Dressing;Toileting OT Advanced ADL's Functional Problem(s): Simple Meal Preparation OT Transfers Functional Problem(s): Toilet;Tub/Shower OT Additional Impairment(s): Fuctional Use of Upper Extremity OT Plan OT Intensity: Minimum of 1-2 x/day, 45 to 90 minutes OT Frequency: 5 out of 7 days OT Duration/Estimated Length of Stay: 10-12 days OT Treatment/Interventions: Medical illustrator training;Community reintegration;Discharge planning;Disease mangement/prevention;DME/adaptive equipment  instruction;Functional mobility training;Pain management;Patient/family education;Psychosocial support;Self Care/advanced ADL retraining;Skin care/wound managment;Splinting/orthotics;Therapeutic Activities;Therapeutic Exercise;UE/LE Strength taining/ROM;UE/LE Coordination activities OT Self Feeding Anticipated Outcome(s): mod I OT Basic Self-Care Anticipated Outcome(s): mod I OT Toileting Anticipated Outcome(s): mod I OT Bathroom Transfers Anticipated Outcome(s): mod  I OT Recommendation Patient destination: Home Follow Up Recommendations: Home health OT;Outpatient OT (home health vs outpatient, if transportation available) Equipment Recommended: Tub/shower bench   Skilled Therapeutic Intervention OT eval initiated, ADL assessment conducted at sit > stand level from EOB.  Pt with increased pain across bilateral shoulders and neck in unsupported sitting.  Required multiple supine rest breaks to allow pain to decrease.  Pt with no clothes on eval, therefore unable to assess dressing. Difficulty with BUE with bathing, noted decreased coordination, dropping items and reports of locking up of fingers on Lt hand.  Ambulated with min assist to bathroom without AD.  OT Evaluation Precautions/Restrictions  Precautions Precautions: Fall;Cervical Precaution Comments: central cord syndrome Required Braces or Orthoses: Cervical Brace Cervical Brace: Hard collar;At all times Restrictions Weight Bearing Restrictions: No Pain Pain Assessment Pain Assessment: 0-10 Pain Score: 10-Worst pain ever Pain Type: Acute pain;Surgical pain Pain Location: Neck Pain Orientation: Posterior Pain Descriptors / Indicators: Aching;Shooting;Stabbing;Sore Pain Onset: On-going Pain Intervention(s): RN made aware;Repositioned;Ambulation/increased activity Home Living/Prior Functioning Home Living Available Help at Discharge: Family;Available PRN/intermittently (brother and neighbor) Type of Home: Apartment Home  Access: Level entry Home Layout: One level Additional Comments: Mailbox and trash disposal on second and third floor-discussed with patient having brother or friend assist with these things upon discharge  Lives With: Alone IADL History Meal Prep Responsibility: Primary Laundry Responsibility: Primary Cleaning Responsibility: Primary Bill Paying/Finance Responsibility: Primary Shopping Responsibility: Primary Prior Function Level of Independence: Independent with gait;Independent with transfers;Independent with homemaking with ambulation;Independent with basic ADLs  Able to Take Stairs?: Yes (reports needing increased time) Driving: No (uses public transportation) Vocation: Unemployed 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, music- History Baseline Vision/History: Wears glasses Wears Glasses:  (reports "when I need them") Patient Visual Report:  (reports floating black spots) Vision- Assessment Vision Assessment?: No apparent visual deficits  Cognition Overall Cognitive Status: Within Functional Limits for tasks assessed Arousal/Alertness: Awake/alert Orientation Level: Oriented X4 Memory: Appears intact Awareness: Appears intact Problem Solving: Appears intact Safety/Judgment: Appears intact Sensation Sensation Light Touch: Impaired Detail Hot/Cold: Appears Intact Proprioception: Appears Intact Additional Comments: impaired sensation (tingling) RUE along ulnar nerve and LUE along ulnar nerve (C6 and C8) and musculocutaneous nerve (C6, C8, T1) Coordination Gross Motor Movements are Fluid and Coordinated: No Fine Motor Movements are Fluid and Coordinated: No Coordination and Movement Description: decreased speed and accuracy of movements in B UE Finger Nose Finger Test: decreased speed Motor  Motor Motor: Abnormal tone;Abnormal postural alignment and control Motor - Skilled Clinical Observations: decreased motor control of B UEs secondary to  central cord syndrome Mobility  Bed Mobility Bed Mobility: Supine to Sit Supine to Sit: HOB elevated;With rails;5: Supervision Supine to Sit Details: Verbal cues for precautions/safety Transfers Sit to Stand: 4: Min assist;From bed;With armrests;With upper extremity assist;From chair/3-in-1 Sit to Stand Details: Verbal cues for sequencing;Verbal cues for technique;Verbal cues for precautions/safety;Manual facilitation for weight shifting Stand to Sit: 4: Min assist;With armrests;With upper extremity assist;To chair/3-in-1;To toilet Stand to Sit Details (indicate cue type and reason): Verbal cues for sequencing;Manual facilitation for weight shifting;Verbal cues for precautions/safety;Verbal cues for technique  Trunk/Postural Assessment  Cervical Assessment Cervical Assessment: Exceptions to [facility] (s/p posterior decompression, in cervical brace) Thoracic Assessment Thoracic Assessment: Within Functional Limits Lumbar Assessment Lumbar Assessment: Within Functional Limits Postural Control Postural Control: Deficits on evaluation Righting Reactions: delayed Protective Responses: delayed  Balance Balance Balance Assessed: Yes Static Sitting Balance Static Sitting - Balance Support: Bilateral upper  extremity supported;No upper extremity supported;Feet supported Static Sitting - Level of Assistance: 6: Modified independent (Device/Increase time) Static Standing Balance Static Standing - Balance Support: Bilateral upper extremity supported;No upper extremity supported;During functional activity Static Standing - Level of Assistance: 4: Min assist Extremity/Trunk Assessment RUE Assessment RUE Assessment: Exceptions to [facility] RUE AROM (degrees) Right Shoulder Flexion: 70 Degrees Right Shoulder ABduction: 90 Degrees Right Thumb Opposition: Digit 2;Digit 3;Digit 4;Digit 5 (increased time) RUE Strength Right Shoulder Flexion: 2+/5 Right Shoulder ABduction: 2+/5 Right Elbow Flexion: 3/5 Right  Elbow Extension: 3/5 Gross Grasp: Impaired Grip (lbs): 30 Lateral Pinch: 11.5 lbs 3 Point Pinch: 8 lbs LUE Assessment LUE Assessment: Exceptions to WFL LUE AROM (degrees) LUE Overall AROM Comments: 100 Left Shoulder Flexion: 80 Degrees Left Shoulder ABduction: 100 Degrees Left Thumb Opposition: Digit 2;Digit 3;Digit 4;Digit 5 (increased time) LUE Strength Left Shoulder Flexion: 2+/5 Left Shoulder ABduction: 2+/5 Left Elbow Flexion: 3/5 Left Elbow Extension: 3/5 Gross Grasp: Impaired Grip (lbs): 42 Lateral Pinch: 17.5 lbs 3 Point Pinch: 5.5 lbs  FIM:  FIM - Grooming Grooming Steps: Wash, rinse, dry face;Wash, rinse, dry hands Grooming: 3: Patient completes 2 of 4 or 3 of 5 steps FIM - Bathing Bathing Steps Patient Completed: Front perineal area;Buttocks;Right upper leg;Left upper leg Bathing: 2: Max-Patient completes 3-4 [id number] parts or 25-49% FIM - Upper Body Dressing/Undressing Upper body dressing/undressing: 0: Wears gown/pajamas-no public clothing FIM - Lower Body Dressing/Undressing Lower body dressing/undressing steps patient completed: Thread/unthread right underwear leg;Thread/unthread left underwear leg;Pull underwear up/down Lower body dressing/undressing: 2: Max-Patient completed 25-49% of tasks FIM - Toileting Toileting steps completed by patient: Adjust clothing prior to toileting;Performs perineal hygiene;Adjust clothing after toileting Toileting: 4: Steadying assist FIM - BEngineer, siteAssistive Devices: HOB elevated;Bed rails;Arm rests Bed/Chair Transfer: 5: Supine > Sit: Supervision (verbal cues/safety issues);4: Bed > Chair or W/C: Min A (steadying Pt. > 75%);4: Chair or W/C > Bed: Min A (steadying Pt. > 75%);5: Sit > Supine: Supervision (verbal cues/safety issues) FIM - TRadio producerDevices: Grab bars Toilet Transfers: 4-To toilet/BSC: Min A (steadying Pt. > 75%);4-From toilet/BSC: Min A (steadying Pt. >  75%) FIM - Tub/Shower Transfers Tub/shower Transfers: 0-Activity did not occur or was simulated   Refer to Care Plan for Long Term Goals  Recommendations for other services: None  Discharge Criteria: Patient will be discharged from OT if patient refuses treatment 3 consecutive times without medical reason, if treatment goals not met, if there is a change in medical status, if patient makes no progress towards goals or if patient is discharged from hospital.  The above assessment, treatment plan, treatment alternatives and goals were discussed and mutually agreed upon: by patient  HSimonne Come8/13/2015, 12:37 PM

## 2014-02-12 NOTE — Progress Notes (Signed)
Occupational Therapy Session Note  Patient Details  Name: Gary Davis MRN: [id number] Date of Birth: 12/25/1955  Today's Date: 02/12/2014 Time: 1303-1400 Time Calculation (min): 57 min  Short Term Goals: Week 1:  OT Short Term Goal 1 (Week 1): Pt will complete UB bathing with min assist OT Short Term Goal 2 (Week 1): Pt will complete UB dressing with min assist OT Short Term Goal 3 (Week 1): Pt will complete LB dressing with min assist OT Short Term Goal 4 (Week 1): Pt will complete toilet transfer with supervision OT Short Term Goal 5 (Week 1): Pt will utilize BUE to complete grooming tasks with supervision  Skilled Therapeutic Interventions/Progress Updates:  Engaged in therapeutic activities with focus on BUE gross and fine motor coordination in sitting and standing.  Completed 9 hole peg test with pt with decreased sensation and FMC affecting success with Rt > Lt.  Rt: 1:27 and Lt: 2:14, pt reports increased numbness in Lt hand and difficulty picking up pegs due to numbness.  Absent stereognosis in Lt hand upon further assessment.  Pt able to open containers to simulate grooming tasks.  Difficulty with in-hand manipulation with pt dropping 3/5 pennies.  Pt demonstrated ability to cross RLE over Lt knee to remove sock but with difficulty donning sock secondary to shoulder/neck pain with forward reaching.  Educated on AE to assist with LB dressing, pt still with difficulty with sock aid due to pain. Pt returned to bed at end of session with SBA.  Therapy Documentation Precautions:  Precautions Precautions: Fall;Cervical Precaution Comments: central cord syndrome Required Braces or Orthoses: Cervical Brace Cervical Brace: Hard collar;At all times Restrictions Weight Bearing Restrictions: No General:   Vital Signs: Therapy Vitals Temp: 97.7 F (36.5 C) Temp src: Oral Pulse Rate: 58 Resp: 17 BP: 112/66 mmHg Patient Position (if appropriate): Lying Oxygen Therapy SpO2: 98  % Pain: Pain Assessment Pain Score: 8   See FIM for current functional status  Therapy/Group: Individual Therapy  Rosalio LoudHOXIE, Charls Custer 02/12/2014, 3:22 PM

## 2014-02-12 NOTE — Progress Notes (Signed)
Social Work Assessment and Plan  Patient Details  Name: Gary Davis MRN: [id number] Date of Birth: 11-21-1955  Today's Date: 02/12/2014  Problem List:  Patient Active Problem List   Diagnosis Date Noted  . Central cord syndrome 02/11/2014  . Myelopathy, spondylogenic, cervical 02/09/2014  . Bilateral arm weakness 02/04/2014  . Cord compression syndrome 02/04/2014  . Tobacco use disorder 02/04/2014  . Arthritis 02/04/2014   Past Medical History:  Past Medical History  Diagnosis Date  . Arthritis   . Cataract    Past Surgical History:  Past Surgical History  Procedure Laterality Date  . Arthroscopic repair acl Right   . Orif ankle fracture Right   . Finger fracture surgery      Right 4th Finger  . Posterior cervical fusion/foraminotomy N/A 02/09/2014    Procedure: POSTERIOR CERVICAL FUSION/FORAMINOTOMY LEVEL 4  Cervical three to seven decompression and fusion with lateral mass screws;  Surgeon: Elaina Hoops, MD;  Location: [place] [facility];  Service: Neurosurgery;  Laterality: N/A;   Social History:  reports that he has been smoking Cigarettes.  He has been smoking about 0.33 packs per day. He does not have any smokeless tobacco history on file. He reports that he drinks alcohol. He reports that he does not use illicit drugs.  Family / Support Systems Marital Status: Divorced Patient Roles: Other (Comment) (brother, son) Other Supports: Gary Davis - brother - [phone] Anticipated Caregiver: brother can help occassionally Ability/Limitations of Caregiver: brother does work and able to stop by around his work schedule Caregiver Availability: Intermittent Family Dynamics: Pt and brother are close.    Social History Preferred language: English Religion: Baptist Education: high school Read: Yes Write: Yes Employment Status: Unemployed (only since condition began in March 2015) Date Retired/Disabled/Unemployed: March 2015 Legal History/Current Legal Issues: None  reported Guardian/Conservator: N/A   Abuse/Neglect Physical Abuse: Denies Verbal Abuse: Denies Sexual Abuse: Denies Exploitation of patient/patient's resources: Denies Self-Neglect: Denies  Emotional Status Pt's affect, behavior and adjustment status: Pt is upbeat, gave CSW good eye contact, seems to have an appropriate reaction to being hospitalized. Recent Psychosocial Issues: Pt has been strapped financially since having to quit work.  Is used to always working.  Community agencies are helping him out. Psychiatric History: None reported Substance Abuse History: None reported  Patient / Family Perceptions, Expectations & Goals Pt/Family understanding of illness & functional limitations: Pt has a good understanding of his condition and feels his questions have been answered by the doctors. Premorbid pt/family roles/activities: Pt is used to being active.  Has always worked. Anticipated changes in roles/activities/participation: Pt has help from his brother, as needed. Pt/family expectations/goals: Pt wants to get stronger and be able to walk better.  Community Resources [employer]: Other (Comment) ([place] and their partner agencies; DSS - food stamps) Premorbid Home Care/DME Agencies: None Transportation available at discharge: brother  Discharge Planning Living Arrangements: [place]: Other relatives;Church/faith community Insurance Resources: Multimedia programmer (specify) Museum/gallery curator) Financial Resources: Other (Comment) (pt has some resources to pay for utilities.  He has help with his rent while he is recovery.) Financial Screen Referred: Yes (CSW already spoke with financial counselor to learn what pt may be eligible for.  CSW to relay this to pt.) Living Expenses: Rent Money Management: Patient Does the patient have any problems obtaining your medications?: No (Although, pt may not have copay money.  CSW to assess.) Home Management: Pt  has been managing this.  Brother will help as needed.  Patient/Family Preliminary Plans: Pt wants to return to his home at d/c. Barriers to Discharge: Steps;Self care;Finances Social Work Anticipated Follow Up Needs: HH/OP Expected length of stay: 10-12 days  Clinical Impression CSW met with pt to introduce self an role of CSW, as well as complete assessment.  Pt was very pleasant and was appreciative of CSW visit.  He has a positive outlook on his recovery and hopes to get better soon so he can return to work.  Pt has good support from his brother. His mother is still living, but is at the early stages of dementia.  He calls her multiple times daily to help her have some stimulation and communication.  Pt is not used to working and wants to be able to provide for himself again.  He has community agencies assisting with his rent and has food stamps to help him get through this time.  Pt is not wanting to rush his time on CIR, but does look forward to going home when he is definitely safe and ready to go.  Pt does not have a PCP and would like for [place] to help him get established with someone.  CSW will pursue this and assist with any other d/c needs.  Gary Davis, Silvestre Mesi 02/12/2014, 3:14 PM

## 2014-02-13 ENCOUNTER — Inpatient Hospital Stay (HOSPITAL_COMMUNITY): Payer: No Typology Code available for payment source

## 2014-02-13 ENCOUNTER — Inpatient Hospital Stay (HOSPITAL_COMMUNITY): Payer: No Typology Code available for payment source | Admitting: Occupational Therapy

## 2014-02-13 ENCOUNTER — Encounter (HOSPITAL_COMMUNITY): Payer: No Typology Code available for payment source

## 2014-02-13 MED ORDER — GABAPENTIN 100 MG PO CAPS
200.0000 mg | ORAL_CAPSULE | Freq: Three times a day (TID) | ORAL | Status: DC
  Administered 2014-02-13 – 2014-02-15 (×6): 200 mg via ORAL
  Filled 2014-02-13 (×9): qty 2

## 2014-02-13 NOTE — Progress Notes (Signed)
Occupational Therapy Session Note  Patient Details  Name: Gary Davis MRN: [id number] Date of Birth: [name]  Today's Date: 02/13/2014 Time: 1530-1610 Time Calculation (min): 40 min  Short Term Goals: Week 1:  OT Short Term Goal 1 (Week 1): Pt will complete UB bathing with min assist OT Short Term Goal 2 (Week 1): Pt will complete UB dressing with min assist OT Short Term Goal 3 (Week 1): Pt will complete LB dressing with min assist OT Short Term Goal 4 (Week 1): Pt will complete toilet transfer with supervision OT Short Term Goal 5 (Week 1): Pt will utilize BUE to complete grooming tasks with supervision  Skilled Therapeutic Interventions/Progress Updates:  Upon entering the room, pt supine in bed with 10/10 pain reported in B shoulders. Pt reporting medication just given. Pt engaged in functional [facility] activities of tying shoes with increased time utilizing bilateral hands in task. Pt able to unbutton 8 small buttons from button up shirt with increased time. Pt able to button only 1 button and showing increased frustration with task. Pt performed resistive opposition into soft, resistive theraputty with bilateral hands x 2 reps of each finger with thumb. Pt ambulated to bathroom with use of RW and steady assist. [facility]. Toileting with supervision. Pt able to fasten shorts and belt with increased time.   Therapy Documentation Precautions:  Precautions Precautions: Fall;Cervical Precaution Comments: central cord syndrome Required Braces or Orthoses: Cervical Brace Cervical Brace: Hard collar;At all times Restrictions Weight Bearing Restrictions: No General: General Amount of Missed OT Time (min): 10 Minutes Vital Signs: Therapy Vitals Temp: 98.8 F (37.1 C) Temp src: Oral Pulse Rate: 68 Resp: 17 BP: 109/70 mmHg Patient Position (if appropriate): Lying Oxygen Therapy SpO2: 98 % O2 Device: None (Room air) Pain: Pain Assessment Pain  Assessment: 0-10 Pain Score: 10-Worst pain ever Pain Type: Chronic pain;Surgical pain Pain Location: Neck Pain Orientation: Right;Left;Posterior Pain Radiating Towards: shoulders, fingers, joints Pain Descriptors / Indicators: Aching;Constant;Cramping;Discomfort Pain Frequency: Constant Pain Onset: On-going Patients Stated Pain Goal: 3 Pain Intervention(s): Medication (See eMAR)  See FIM for current functional status  Therapy/Group: Individual Therapy  Lowella Gripittman, Ean Gettel L 02/13/2014, 4:21 PM

## 2014-02-13 NOTE — Progress Notes (Signed)
Occupational Therapy Session Note  Patient Details  Name: Gary Davis MRN: [id number] Date of Birth: 07/02/1956  Today's Date: 02/13/2014 Time: 1500-1520 Time Calculation (min): 20 min  Short Term Goals: Week 1:  OT Short Term Goal 1 (Week 1): Pt will complete UB bathing with min assist OT Short Term Goal 2 (Week 1): Pt will complete UB dressing with min assist OT Short Term Goal 3 (Week 1): Pt will complete LB dressing with min assist OT Short Term Goal 4 (Week 1): Pt will complete toilet transfer with supervision OT Short Term Goal 5 (Week 1): Pt will utilize BUE to complete grooming tasks with supervision  Skilled Therapeutic Interventions/Progress Updates:    Pt seen for 1:1 OT session with focus on BUE fine motor coordination and in hand manipulation. Pt received supine in bed. Pt willing to begin therapy session despite pain. Engaged in [facility] activities of flipping over poker chips with min difficulty. Began stacking coins with increased time and knocking stack multiple times. Engaged in in-hand manipulation task of placing poker chips in slotted cup (simulating placing coins in slot) with emphasis on transferring chips palm>finger tips. Pt completed with individual chip with slight increased time. Progressed to holding 2 chips and placing individual with pt demonstrating increased difficulty, dropping coins multiple times. Pt reporting increased pain in shoulder, requesting to end therapy session. Pt left supine in bed with pillows placed for positioning and all needs in reach.   Therapy Documentation Precautions:  Precautions Precautions: Fall;Cervical Precaution Comments: central cord syndrome Required Braces or Orthoses: Cervical Brace Cervical Brace: Hard collar;At all times Restrictions Weight Bearing Restrictions: No General: General Amount of Missed OT Time (min): 10 Minutes Vital Signs: Therapy Vitals Temp: 98.8 F (37.1 C) Temp src: Oral Pulse Rate: 68 Resp:  17 BP: 109/70 mmHg Patient Position (if appropriate): Lying Oxygen Therapy SpO2: 98 % O2 Device: None (Room air) Pain: Pt reporting 10/10 pain in right shoulder. Pt relieved when supine in bed positioned with pillows.   See FIM for current functional status  Therapy/Group: Individual Therapy  Daneil Danerkinson, Trishia Cuthrell N 02/13/2014, 3:44 PM

## 2014-02-13 NOTE — Progress Notes (Signed)
Physical Therapy Session Note  Patient Details  Name: Gary RidingMichael D Vanderlinde MRN: [id number] Date of Birth: 03/29/1956  Today's Date: 02/13/2014 Time: [id number] Time Calculation (min): 60 min  Short Term Goals: Week 1:  PT Short Term Goal 1 (Week 1): Patient will perform bed mobility with mod I. PT Short Term Goal 2 (Week 1): Patient will perform functional transfers with supervision. PT Short Term Goal 3 (Week 1): Patient will ambulate 150' with LRAD and supervision. PT Short Term Goal 4 (Week 1): Patient will negotiate 10 steps with one handrail and supervision.  Skilled Therapeutic Interventions/Progress Updates:   Pt. Received semi-reclined in bed with cervical collar donned. Pt. Agreeable to physical therapy session. Pt. Required mod A for donning of shoes prior to session. Pt. adherent to precautions and utilized RW for transfers and ambulation throughout session.  Pt engaged in [facility] activites: balance, gate, transfers (including car), stairs. Focus on activity tolerance, toileting, transfers, and safety awareness. Pt. Assist level is min guard assist to (S) with verbal cues for RW use. Pt. Performed all pericare and hand hygiene at min guard assist; did not require any additional set up or steady. Pt. Ambulated 200 feet x 2, maneuvered around environmental obstacles and performed multiple balance trials on controled surface shifting weight anterior, posterior, lateral without BUE support. Pt., presents with posterior bias and needs additional training on ankle strategy; increase difficulty to simulate community ambulation goals.  Pt. Reports 10/10 pain with MD present and addressing pain levels. Pt. Indicated entire session was 10/10 due to nerve pain overall with emphasis on R shoulder pain with flexion or extension.  Pt. Seated EOB with all needs within reach.   Therapy Documentation Precautions:  Precautions Precautions: Fall;Cervical Precaution Comments: central cord  syndrome Required Braces or Orthoses: Cervical Brace Cervical Brace: Hard collar;At all times Restrictions Weight Bearing Restrictions: No  Pain: Pain Assessment Pain Assessment: 0-10 Pain Score: 10-Worst pain ever Pain Type: Chronic pain;Surgical pain Pain Location: Neck Pain Orientation: Right;Left;Posterior Pain Radiating Towards: shoulders, fingers, joints Pain Descriptors / Indicators: Aching;Constant;Cramping;Discomfort Pain Frequency: Constant Pain Onset: On-going Patients Stated Pain Goal: 3 Pain Intervention(s): Medication (See eMAR)  Locomotion : Ambulation Ambulation/Gait Assistance: 4: Min guard   See FIM for current functional status  Therapy/Group: Individual Therapy  Raiford NobleSmith, Cherica Heiden R 02/13/2014, 5:36 PM

## 2014-02-13 NOTE — IPOC Note (Signed)
Overall Plan of Care [facility](IPOC) Patient Details Name: Gary RidingMichael D Davis MRN: [id number] DOB: 11/18/1955  Admitting Diagnosis: central cord syndrome  Hospital Problems: Active Problems:   Central cord syndrome     Functional Problem List: Nursing Medication Management;Pain;Safety;Skin Integrity  PT Balance;Endurance;Motor;Pain;Safety;Sensory  OT Balance;Endurance;Motor;Pain;Safety;Sensory  SLP    TR         Basic ADL's: OT Eating;Grooming;Bathing;Dressing;Toileting     Advanced  ADL's: OT Simple Meal Preparation     Transfers: PT Bed Mobility;Bed to Chair;Car;Furniture;Floor  OT Toilet;Tub/Shower     Locomotion: PT Ambulation;Wheelchair Mobility;Stairs     Additional Impairments: OT Fuctional Use of Upper Extremity  SLP        TR      Anticipated Outcomes Item Anticipated Outcome  Self Feeding mod I  Swallowing      Basic self-care  mod I  Toileting  mod I   Bathroom Transfers mod I  Bowel/Bladder  Continent of bowel and bladder LBM 02/11/2014  Transfers  mod I  Locomotion  mod I household, supervision community  Communication     Cognition     Pain  Pain level <4  Safety/Judgment  supervision   Therapy Plan: PT Intensity: Minimum of 1-2 x/day ,45 to 90 minutes PT Frequency: 5 out of 7 days PT Duration Estimated Length of Stay: 10-12 days OT Intensity: Minimum of 1-2 x/day, 45 to 90 minutes OT Frequency: 5 out of 7 days OT Duration/Estimated Length of Stay: 10-12 days         Team Interventions: Nursing Interventions Pain Management;Medication Management;Skin Care/Wound Management;Discharge Planning  PT interventions Ambulation/gait training;Disease management/prevention;Pain management;Stair training;Wheelchair propulsion/positioning;Therapeutic Activities;Patient/family education;DME/adaptive equipment instruction;Balance/vestibular training;Psychosocial support;Therapeutic Exercise;UE/LE Strength taining/ROM;Skin care/wound  management;Functional mobility training;Community reintegration;Discharge planning;Neuromuscular re-education;Splinting/orthotics;UE/LE Coordination activities  OT Interventions Balance/vestibular training;Community reintegration;Discharge planning;Disease mangement/prevention;DME/adaptive equipment instruction;Functional mobility training;Pain management;Patient/family education;Psychosocial support;Self Care/advanced ADL retraining;Skin care/wound managment;Splinting/orthotics;Therapeutic Activities;Therapeutic Exercise;UE/LE Strength taining/ROM;UE/LE Coordination activities  SLP Interventions    TR Interventions    SW/CM Interventions Discharge Planning;Psychosocial Support;Patient/Family Education    Team Discharge Planning: Destination: PT-Home ,OT- Home , SLP-  Projected Follow-up: PT-Home health PT;Outpatient PT (HHPT vs. OPPT TBD), OT-  Home health OT;Outpatient OT (home health vs outpatient, if transportation available), SLP-  Projected Equipment Needs: PT-To be determined, OT- Tub/shower bench, SLP-  Equipment Details: PT-Patient does not own any DME; recommendation TBD upon discharge, OT-  Patient/family involved in discharge planning: PT- Patient,  OT-Patient, SLP-   MD ELOS: 14-18days          Medical Rehab Prognosis:  Good Assessment: 58 y.o. right-handed male admitted 02/04/2014 with complaints of numbness and spasticity /weakness of both hands over the past 2 months. He reported not being able to grasp and hold objects due to these symptoms as well a couple of falls. . Cranial CT scan negative. X-rays and imaging revealed severe cervical stenosis from C3-C7 consistent with cervical central cord syndrome. Placed on intravenous Decadron. Underwent posterior cervical decompressive laminectomy and foraminotomies at C3-4, C4-5, C5-6, C6-7 with posterior cervical lateral mass screw fixation from C3-C7 02/09/2014 per Dr. Wynetta Emeryram   Now requiring 24/7 Rehab RN,MD, as well as CIR level PT, OT  .  Treatment team will focus on ADLs and mobility with goals set at Mod I   See Team Conference Notes for weekly updates to the plan of care

## 2014-02-13 NOTE — Progress Notes (Signed)
Occupational Therapy Session Note  Patient Details  Name: Gary RidingMichael D Banton MRN: [id number] Date of Birth: 08/24/1955  Today's Date: 02/13/2014 Time: 0800-0900 Time Calculation (min): 60 min  Short Term Goals: Week 1:  OT Short Term Goal 1 (Week 1): Pt will complete UB bathing with min assist OT Short Term Goal 2 (Week 1): Pt will complete UB dressing with min assist OT Short Term Goal 3 (Week 1): Pt will complete LB dressing with min assist OT Short Term Goal 4 (Week 1): Pt will complete toilet transfer with supervision OT Short Term Goal 5 (Week 1): Pt will utilize BUE to complete grooming tasks with supervision  Skilled Therapeutic Interventions/Progress Updates:    Pt engaged in BADL retraining including bathing and dressing from EOB. Pt performed bathing tasks while standing.  Pt required assistance to bathe feet secondary to increased shoulder pain when bending over.  Pt completed dressing with sit<>stand from EOB requiring assistance with fastening belt on pants.  Pt stood at sink to complete grooming tasks.  Focus on increased BUE use for BADLs, dynamic standing balance, functional amb with/without RW, and safety awareness.  Therapy Documentation Precautions:  Precautions Precautions: Fall;Cervical Precaution Comments: central cord syndrome Required Braces or Orthoses: Cervical Brace Cervical Brace: Hard collar;At all times Restrictions Weight Bearing Restrictions: No   Pain: Pain Assessment Pain Assessment: 0-10 Pain Score: 10-Worst pain ever Pain Type: Acute pain Pain Location: Neck Pain Orientation: Posterior Pain Descriptors / Indicators: Aching Pain Onset: On-going Pain Intervention(s): Repositioned;RN made aware  See FIM for current functional status  Therapy/Group: Individual Therapy  Rich BraveLanier, Jecenia Leamer Chappell 02/13/2014, 9:01 AM

## 2014-02-13 NOTE — Progress Notes (Signed)
Subjective/Complaints: Right sided neck pain inhibited sleep and therapy, hand pain as well, still rates 10/10, walking with therapist using RW, appears comfortable  Review of Systems - Negative except constipation and as above  Objective: Vital Signs: Blood pressure 137/75, pulse 53, temperature 97.8 F (36.6 C), temperature source Oral, resp. rate 18, height 6' 1"  (1.854 m), weight 76.3 kg (168 lb 3.4 oz), SpO2 95.00%. No results found. Results for orders placed during the hospital encounter of 02/11/14 (from the past 72 hour(s))  CBC WITH DIFFERENTIAL     Status: Abnormal   Collection Time    02/12/14  5:35 AM      Result Value Ref Range   WBC 8.2  4.0 - 10.5 K/uL   RBC 3.57 (*) 4.22 - 5.81 MIL/uL   Hemoglobin 12.4 (*) 13.0 - 17.0 g/dL   HCT 36.1 (*) 39.0 - 52.0 %   MCV 101.1 (*) 78.0 - 100.0 fL   MCH 34.7 (*) 26.0 - 34.0 pg   MCHC 34.3  30.0 - 36.0 g/dL   RDW 12.9  11.5 - 15.5 %   Platelets 151  150 - 400 K/uL   Neutrophils Relative % 60  43 - 77 %   Neutro Abs 4.9  1.7 - 7.7 K/uL   Lymphocytes Relative 17  12 - 46 %   Lymphs Abs 1.4  0.7 - 4.0 K/uL   Monocytes Relative 22 (*) 3 - 12 %   Monocytes Absolute 1.8 (*) 0.1 - 1.0 K/uL   Eosinophils Relative 1  0 - 5 %   Eosinophils Absolute 0.1  0.0 - 0.7 K/uL   Basophils Relative 0  0 - 1 %   Basophils Absolute 0.0  0.0 - 0.1 K/uL  COMPREHENSIVE METABOLIC PANEL     Status: Abnormal   Collection Time    02/12/14  5:35 AM      Result Value Ref Range   Sodium 137  137 - 147 mEq/L   Potassium 4.3  3.7 - 5.3 mEq/L   Chloride 98  96 - 112 mEq/L   CO2 28  19 - 32 mEq/L   Glucose, Bld 125 (*) 70 - 99 mg/dL   BUN 15  6 - 23 mg/dL   Creatinine, Ser 0.74  0.50 - 1.35 mg/dL   Calcium 8.9  8.4 - 10.5 mg/dL   Total Protein 6.2  6.0 - 8.3 g/dL   Albumin 2.9 (*) 3.5 - 5.2 g/dL   AST 12  0 - 37 U/L   ALT 10  0 - 53 U/L   Alkaline Phosphatase 55  39 - 117 U/L   Total Bilirubin <0.2 (*) 0.3 - 1.2 mg/dL   GFR calc non Af Amer >90   >90 mL/min   GFR calc Af Amer >90  >90 mL/min   Comment: (NOTE)     The eGFR has been calculated using the CKD EPI equation.     This calculation has not been validated in all clinical situations.     eGFR's persistently <90 mL/min signify possible Chronic Kidney     Disease.   Anion gap 11  5 - 15     HEENT: normal and poor dentition Cardio: RRR and no murmur Resp: CTA B/L and unlabored GI: BS positive and NT, ND Extremity:  Pulses positive and No Edema Skin:   Intact and Wound C/D/I and post cervical Neuro: Alert/Oriented Musc/Skel:  Other bilateral intrinsic hand deformities Gen NAD motor strength is 3 minus bilateral deltoid biceps  triceps and finger flexion finger extension wrist flexion and wrist extension.  Decreased sensation in bilateral C5 C6-C7 dermatomal distribution  4 minus in the hip flexors knee extensors ankle dorsiflexors and plantar flexors bilaterally  Decreased sensation bilateral big toes   Assessment/Plan: 1. Functional deficits secondary to Central cord syndrome, s/p C3-C7 PSF which require 3+ hours per day of interdisciplinary therapy in a comprehensive inpatient rehab setting. Physiatrist is providing close team supervision and 24 hour management of active medical problems listed below. Physiatrist and rehab team continue to assess barriers to discharge/monitor patient progress toward functional and medical goals. FIM: FIM - Bathing Bathing Steps Patient Completed: Chest;Right Arm;Left Arm;Abdomen;Front perineal area;Buttocks;Right upper leg;Left upper leg Bathing: 4: Min-Patient completes 8-9 44f10 parts or 75+ percent  FIM - Upper Body Dressing/Undressing Upper body dressing/undressing steps patient completed: Thread/unthread right sleeve of pullover shirt/dresss;Thread/unthread left sleeve of pullover shirt/dress;Put head through opening of pull over shirt/dress;Pull shirt over trunk Upper body dressing/undressing: 5: Set-up assist to: Obtain  clothing/put away FIM - Lower Body Dressing/Undressing Lower body dressing/undressing steps patient completed: Thread/unthread right underwear leg;Thread/unthread left underwear leg;Pull underwear up/down;Thread/unthread right pants leg;Thread/unthread left pants leg;Pull pants up/down;Don/Doff right sock;Don/Doff left sock Lower body dressing/undressing: 4: Min-Patient completed 75 plus % of tasks  FIM - Toileting Toileting steps completed by patient: Adjust clothing prior to toileting;Performs perineal hygiene;Adjust clothing after toileting Toileting: 4: Steadying assist  FIM - TRadio producerDevices: Grab bars Toilet Transfers: 4-To toilet/BSC: Min A (steadying Pt. > 75%);4-From toilet/BSC: Min A (steadying Pt. > 75%)  FIM - Bed/Chair Transfer Bed/Chair Transfer Assistive Devices: HOB elevated;Bed rails;Arm rests Bed/Chair Transfer: 5: Supine > Sit: Supervision (verbal cues/safety issues);4: Bed > Chair or W/C: Min A (steadying Pt. > 75%);4: Chair or W/C > Bed: Min A (steadying Pt. > 75%);5: Sit > Supine: Supervision (verbal cues/safety issues)  FIM - Locomotion: Wheelchair Distance: 150 Locomotion: Wheelchair: 1: Total Assistance/staff pushes wheelchair (Pt<25%) (totalA due to recliner wheelchair) FIM - Locomotion: Ambulation Locomotion: Ambulation Assistive Devices: WAdministratorAmbulation/Gait Assistance: 4: Min assist Locomotion: Ambulation: 2: Travels 50 - 149 ft with minimal assistance (Pt.>75%)  Comprehension Comprehension Mode: Auditory Comprehension: 7-Follows complex conversation/direction: With no assist  Expression Expression Mode: Verbal Expression: 7-Expresses complex ideas: With no assist  Social Interaction Social Interaction: 7-Interacts appropriately with others - No medications needed.  Problem Solving Problem Solving: 7-Solves complex problems: Recognizes & self-corrects  Memory Memory: 7-Complete Independence: No  helper  Medical Problem List and Plan:  1. Functional deficits secondary to central cord syndrome status post C3-C7 posterior decompression and fusion. Cervical collar at all times  2. DVT Prophylaxis/Anticoagulation: SCDs. Monitor for any signs of DVT  3. Pain Management: Neurontin 100 mg 3 times a day will titrate up since his current pain seems more neuropathic,  Increase Oxycodone and Robaxin as needed. Monitor with increased mobility  4. tobacco abuse. NicoDerm patch. Provide counseling  5. Neuropsych: This patient is capable of making decisions on his own behalf.  6. Skin/Wound Care: Routine skin care   LOS (Days) 2 A FACE TO FACE EVALUATION WAS PERFORMED  Gary Davis 02/13/2014, 9:20 AM

## 2014-02-14 ENCOUNTER — Inpatient Hospital Stay (HOSPITAL_COMMUNITY): Payer: No Typology Code available for payment source | Admitting: Occupational Therapy

## 2014-02-14 ENCOUNTER — Inpatient Hospital Stay (HOSPITAL_COMMUNITY): Payer: No Typology Code available for payment source | Admitting: *Deleted

## 2014-02-14 NOTE — Progress Notes (Addendum)
Social Work Patient ID: Gary Davis, male   DOB: 05-14-1956, 58 y.o.   MRN: [id number]  CSW met with pt 02-13-14 to inform him that CSW would be out of the hospital next week and that the rest of CSW team, Lorre Nick and Jacqlyn Larsen, would be available for him, as needed.  CSW has called [facility] to see if they can see pt for primary care.  CSW awaits return call.  Pt has no current needs/concerns/questions.  CSW team will continue to follow.

## 2014-02-14 NOTE — Progress Notes (Signed)
  Subjective/Complaints: Right sided neck pain - seems to improve later in the day  Review of Systems -   Objective: Vital Signs: Blood pressure 106/62, pulse 57, temperature 97.8 F (36.6 C), temperature source Oral, resp. rate 18, height 6\' 1"  (1.854 m), weight 168 lb 3.4 oz (76.3 kg), SpO2 98.00%.  nad Wearing neck brace Chest - cta cv- reg rate abd- soft Ext- no edema  Assessment/Plan: 1. Functional deficits secondary to Central cord syndrome, s/p C3-C7 PSF  Medical Problem List and Plan:  1. Functional deficits secondary to central cord syndrome status post C3-C7 posterior decompression and fusion. Cervical collar at all times  2. DVT Prophylaxis/Anticoagulation: SCDs. Monitor for any signs of DVT  3. Pain Management: Neurontin 200 mg 3 times a dayincreased yesterday 4. tobacco abuse. NicoDerm patch. Provide counseling  5. Neuropsych: This patient is capable of making decisions on his own behalf.  6. Skin/Wound Care: Routine skin care   LOS (Days) 3 A FACE TO FACE EVALUATION WAS PERFORMED  Lilymarie Scroggins HENRY 02/14/2014, 9:51 AM

## 2014-02-14 NOTE — Plan of Care (Signed)
Problem: RH PAIN MANAGEMENT Goal: RH STG PAIN MANAGED AT OR BELOW PT'S PAIN GOAL Less or equal to 5  Outcome: Not Progressing Patient reports pain always at 9-10 out of 10.

## 2014-02-14 NOTE — Progress Notes (Signed)
Occupational Therapy Session Note  Patient Details  Name: Gary Davis MRN: [id number] Date of Birth: 12/24/1955  Today's Date: 02/14/2014 Time: [id number] and [id number] Time Calculation (min): 60 min and 60 min  Short Term Goals: Week 1:  OT Short Term Goal 1 (Week 1): Pt will complete UB bathing with min assist OT Short Term Goal 2 (Week 1): Pt will complete UB dressing with min assist OT Short Term Goal 3 (Week 1): Pt will complete LB dressing with min assist OT Short Term Goal 4 (Week 1): Pt will complete toilet transfer with supervision OT Short Term Goal 5 (Week 1): Pt will utilize BUE to complete grooming tasks with supervision  Skilled Therapeutic Interventions/Progress Updates:  Session 1 [phone]): Pt with 10/10 pain in R shoulder this session and pt reporting, "I just feel so stiff." Pt agreeable to participate in OT session. Pt seated on EOB upon therapist arrival. OT session with focus on ADL retraining and dynamic standing balance. Pt standing to wash upper body, peri area, buttocks, and face with intermittent rest breaks secondary to increase pain in R shoulder. Pt seated on EOB in order to "pull" legs towards chest in order to perform LB bathing. OT educated pt on dressing the weaker extrememity first to increase I with functional tasks. Pt stood at sink side with RW in order to perform grooming tasks.   Session 2  [phone]): Upon entering the room pt seated on EOB awaiting therapist arrival. Pt continues to report 10/10 pain in R shoulder. RN present during session to present medication to patient. Pt ambulated with supervision and use of RW to ADL apartment. Pt obtained all items needed from cabinets to make Jello. Pt heated water on stove by adjusting temperature as appropriate and turning burner off once water boiled. Pt read and followed all directions to create Jello. Pt standing ~ 15 minutes without rest to complete kitchen tasks. Pt also cleaning counter and washing  dishes in sink for clean up with supervision. Pt engaged in [facility] tasks for medication management to open medicine container and obtain "pills" within to place into pill box. Pt able to place 7 pills in but able to take out 2/7 with R hand. Pt with continued severe pain in R shoulder. OT addressing trigger points found in R trapezius muscle and pt reporting decrease in pain to 8/10 almost immediately. Pt educated on [facility] exercises of rapid opposition, closed fist/open fist, and digit isolation in order to increase [facility] in B hands. Pt returned demonstrations x 10 reps each on each hand.   Therapy Documentation Precautions:  Precautions Precautions: Fall;Cervical Precaution Comments: central cord syndrome Required Braces or Orthoses: Cervical Brace Cervical Brace: Hard collar;At all times Restrictions Weight Bearing Restrictions: No Pain: Pain Assessment Pain Assessment: 0-10 Pain Score: 10-Worst pain ever Pain Type: Chronic pain Pain Location: R shoulder Pain Descriptors / Indicators: Aching Pain Frequency: Constant Pain Onset: On-going Pain Intervention(s): Medication (See eMAR)   Therapy/Group: Individual Therapy  Lowella Gripittman, Gary Davis 02/14/2014, 12:52 PM

## 2014-02-14 NOTE — Progress Notes (Signed)
Physical Therapy Session Note  Patient Details  Name: Gary Davis MRN: [id number] Date of Birth: 08/11/1955  Today's Date: 02/14/2014 Time: 1007-1105 Time Calculation (min): 58 min   Skilled Therapeutic Interventions/Progress Updates:  Patient in bed at the beginning of the session, agrees to therapy, complains of pain, nursing notified, administered medicine during tx. Patient able to perform supine=>sit>stand with Supervision, gait with RW to /fropm gym with Stand by assistance and cues for posture and to reduce WB through UE.  Biodex 2 x Maze exercise on level 10 and 11-to facilitate balance and weight shifting.  Kinetron 3 x 1 min in standing to increase muscular strength and weight shifting in medial and  lateral direction. Tandem walking 1 x 50 feet with HHA, high knee 1x 50 and back kick 1 x 50 feet , all w/o AD and with HHA.  Obstacle courses including , stepping on foam board , stepping over obstacles and steping on steps as well as 5 stairs ( B hand rails -supervision) At the end of the session patient complained of tension in B traps- manual trigger point release performed ,followed by stretching.  Patient returned to room, all needs within reach.  Therapy Documentation Precautions:  Precautions Precautions: Fall;Cervical Precaution Comments: central cord syndrome Required Braces or Orthoses: Cervical Brace Cervical Brace: Hard collar;At all times Restrictions Weight Bearing Restrictions: No Pain: Pain Assessment Pain Assessment: 0-10 Pain Score: 10-Worst pain ever Pain Type: Chronic pain Pain Location: Neck Pain Descriptors / Indicators: Aching Pain Frequency: Constant Pain Onset: On-going Pain Intervention(s): Medication (See eMAR)  See FIM for current functional status  Therapy/Group: Individual Therapy  Dorna MaiCzajkowska, Judit Awad W 02/14/2014, 12:42 PM

## 2014-02-15 ENCOUNTER — Inpatient Hospital Stay (HOSPITAL_COMMUNITY): Payer: No Typology Code available for payment source | Admitting: *Deleted

## 2014-02-15 ENCOUNTER — Inpatient Hospital Stay (HOSPITAL_COMMUNITY): Payer: No Typology Code available for payment source

## 2014-02-15 DIAGNOSIS — G8250 Quadriplegia, unspecified: Secondary | ICD-10-CM

## 2014-02-15 MED ORDER — GABAPENTIN 400 MG PO CAPS
400.0000 mg | ORAL_CAPSULE | Freq: Every day | ORAL | Status: DC
  Administered 2014-02-15 – 2014-02-19 (×5): 400 mg via ORAL
  Filled 2014-02-15 (×7): qty 1

## 2014-02-15 MED ORDER — GABAPENTIN 100 MG PO CAPS
200.0000 mg | ORAL_CAPSULE | Freq: Two times a day (BID) | ORAL | Status: DC
  Administered 2014-02-15 – 2014-02-20 (×11): 200 mg via ORAL
  Filled 2014-02-15 (×13): qty 2

## 2014-02-15 NOTE — Plan of Care (Signed)
Problem: RH PAIN MANAGEMENT Goal: RH STG PAIN MANAGED AT OR BELOW PT'S PAIN GOAL Less or equal to 7  Outcome: Not Progressing Consistently rates pain 9-10/10 even after pain meds

## 2014-02-15 NOTE — Progress Notes (Signed)
  Subjective/Complaints: Right sided neck pain - continues to have pain and spasm- especially at night and early a.m.  Review of Systems -   Objective: Vital Signs: Blood pressure 116/76, pulse 74, temperature 98 F (36.7 C), temperature source Oral, resp. rate 18, height 6\' 1"  (1.854 m), weight 168 lb 3.4 oz (76.3 kg), SpO2 98.00%.  nad Wearing neck brace Chest - cta cv- reg rate abd- soft Ext- no edema  Assessment/Plan: 1. Functional deficits secondary to Central cord syndrome, s/p C3-C7 PSF  Medical Problem List and Plan:  1. Functional deficits secondary to central cord syndrome status post C3-C7 posterior decompression and fusion. Cervical collar at all times  2. DVT Prophylaxis/Anticoagulation: SCDs. Monitor for any signs of DVT  3. Pain Management: Neurontin 200 mg 3 times a day is current dose Will increase night time neurontin 4. tobacco abuse. NicoDerm patch. Provide counseling  5. Neuropsych: This patient is capable of making decisions on his own behalf.  6. Skin/Wound Care: Routine skin care   LOS (Days) 4 A FACE TO FACE EVALUATION WAS PERFORMED  Rachael Zapanta HENRY 02/15/2014, 9:09 AM

## 2014-02-15 NOTE — Progress Notes (Signed)
Physical Therapy Note  Patient Details  Name: Gary Davis MRN: [id number] Date of Birth: 04/13/1956 Today's Date: 02/15/2014  Pt refused therapy this PM. Pt complained of 8/10 shoulder pain, vocalized that it hurt whenever he moved and especially when he walked. Pt refused treatment despite max encouragement. Pt missed 15 minutes of scheduled PT time. Pt left supine in bed w/ heating pad on R shoulder w/ all needs within reach.   Hosie SpangleGodfrey, Josha Weekley 02/15/2014, 5:21 PM

## 2014-02-15 NOTE — Progress Notes (Signed)
Physical Therapy Session Note  Patient Details  Name: Gary Davis MRN: [id number] Date of Birth: 01-27-1956  Today's Date: 02/15/2014 Time: 2703-5009 Time Calculation (min): 48 min  Short Term Goals: Week 1:  PT Short Term Goal 1 (Week 1): Patient will perform bed mobility with mod I. PT Short Term Goal 2 (Week 1): Patient will perform functional transfers with supervision. PT Short Term Goal 3 (Week 1): Patient will ambulate 150' with LRAD and supervision. PT Short Term Goal 4 (Week 1): Patient will negotiate 10 steps with one handrail and supervision.  Skilled Therapeutic Interventions/Progress Updates:     Patient performed short sit to and form supine in bed utilizing log roll technique with close supervision with Aspen collar donned. Patient sat edge of bed and aspen collar adjusted and pads replacement and aspen collar adjusted for proper fit and safety.   Transfers: Sit to and from stand transfer with and without rolling walker close supervision; verbal cues for hand placement and controlled descent.  Stand pivot transfer  with and without rolling walker close supervision: verbal cues for sequencing, hand placement and controlled descent. Spinal Precautions and Aspen collar donned throughout session.   Ambulation: Patient ambulated 200 feetx2 and 150 feet with rolling walker close supervision. Patient ambulated with a step through gait pattern. Patient required verbal cues for rolling walker management during turns and in tight spaces. Patient displays decreased toe off and heel strike bilaterally, patient able to improve with verbal cues. Patient required verbal cues for environmental awareness, obstacle negotiation as well as pacing.  Spinal Precautions and Aspen collar donned throughout session.  Stairs: Patient negotiated 15 steps with a right handrail and contact guard. Patient performed stairs with a reciprocal pattern. Patient required contact guard assist for  steadying assist. Verbal cues required for foot placement. Spinal Precautions and Aspen collar donned throughout session.  Seated there ex: B Long arc quads 3x10 B ankle pumps 3x10 B hip flexion 3x10  Standing there ex: B heel raises 3x10 Box squats 30x  Toileting: Patient continent of bladder during session. Patient performed ambulatory transfer to and from standard height tiolet. Patient was able don/doff clothing with supervison. Patient performed hygiene independently on toilet.  Patient performed side-stepping to the right and left 20 feet in each direction without assistive device. Patient required contact guard assist and multiple episodes on min assist to recover from LOB in both directions. No overt differences in loss of balance to the right or left.   Patient tolerated treatment well despite increased complaints of pain. Patient tolerated session with frequent short duration rest breaks throughout. Session focused on endurance, strengthening, maintaining precautions, safety, and functional mobility.   Patient returned to bed at end of session with all needs met. Plan of care to address, safety, precautions, functional mobility, dynamic balance, pain management, endurance, and strengthening to ensure a safe discharge home and maximize independence.         Therapy Documentation Precautions:  Precautions Precautions: Fall;Cervical Precaution Comments: central cord syndrome Required Braces or Orthoses: Cervical Brace Cervical Brace: Hard collar;At all times Restrictions Weight Bearing Restrictions: No General: Amount of Missed PT Time (min): 13 Minutes Missed Time Reason: Pain   Pain: Pain Assessment Pain Assessment: 0-10 Pain Score: 10-Worst pain ever Pain Type: Acute pain Pain Location: Back Pain Orientation: Upper;Right Pain Descriptors / Indicators: Constant Pain Frequency: Constant Pain Onset: On-going Patients Stated Pain Goal: 4 Pain Intervention(s):  Relaxation;Repositioned  See FIM for current functional status  Therapy/Group: Individual Therapy  Retta Diones 02/15/2014, 12:12 PM

## 2014-02-16 ENCOUNTER — Inpatient Hospital Stay (HOSPITAL_COMMUNITY): Payer: No Typology Code available for payment source

## 2014-02-16 ENCOUNTER — Inpatient Hospital Stay (HOSPITAL_COMMUNITY): Payer: No Typology Code available for payment source | Admitting: *Deleted

## 2014-02-16 ENCOUNTER — Encounter (HOSPITAL_COMMUNITY): Payer: No Typology Code available for payment source

## 2014-02-16 DIAGNOSIS — G8250 Quadriplegia, unspecified: Secondary | ICD-10-CM

## 2014-02-16 MED ORDER — METHOCARBAMOL 500 MG PO TABS
500.0000 mg | ORAL_TABLET | Freq: Four times a day (QID) | ORAL | Status: DC
  Administered 2014-02-16 – 2014-02-17 (×5): 500 mg via ORAL
  Filled 2014-02-16 (×9): qty 1

## 2014-02-16 NOTE — Progress Notes (Signed)
Physical Therapy Session Note  Patient Details  Name: Gary Davis MRN: [id number] Date of Birth: 09/23/1955  Today's Date: 02/16/2014 Time: [id number] & [id number] Time Calculation (min): 61 min & 61 min  Short Term Goals: Week 1:  PT Short Term Goal 1 (Week 1): Patient will perform bed mobility with mod I. PT Short Term Goal 2 (Week 1): Patient will perform functional transfers with supervision. PT Short Term Goal 3 (Week 1): Patient will ambulate 150' with LRAD and supervision. PT Short Term Goal 4 (Week 1): Patient will negotiate 10 steps with one handrail and supervision.  Skilled Therapeutic Interventions/Progress Updates:  AM Session (61 min): Patient reports pain as 10/10, see below for details. Patient semi-reclined in bed with heating pad on R shoulder. Patient assisted with dressing due to time. Patient ambulated with RW >500 feet with supervision to min assist for balance and occasional cueing for safety and posture. Patient ambulated up and down 22 steps with 1 rail up and 2 rail down min assist and cueing for sequencing (step over up / step-to down. Patient transferred bed to stand with steady assist for trail use of SPC. Pt requested RW after ambulating 20 feet with SPC; feels RW is more stable. Pt. Requires frequent verbal cues for step-height, especially L LE; LOBs are controled with RW use and advise [facility] with improved gait techniques. Pt seated EOB with bed alarm armed; pt. Assured therapist he would call for assistance with all needs within reach. Pt. Made S in room with Cane or RW; pts preference.  PM Session (60 min):  Pt. Reports "hurts less" prior to therapist session starting. Pt. States he is agreeable to physical therapy session as long as therapist knows he is "tired" and "can't do much." Pt. Sitting EOB with SPC use to stand. Pt. Ambulated 150 feet with SPC at min A level requiring verbal cues for proper sequencing, weight shifting, and step height.  Pt. Performed 15 minutes of NUSTEP lvl 3 for activity endurance with x4 extremities; Pt did not tolerate use of RUE continuously secondary to pain and transitioned to intermittent use. Pt. Did not tolerate quadriped on mat table secondary to neck pain. Pt. Ambulated in community environment and uncontrolled surfaces for >1000 feet with occasional standing therapeutic rest/recovery break. Pt displayed increased step height with irregular surfaces: bricks, sidewalk, flooring/surface transitions. Pt. Tolerated prolonged ambulation with occasional verbal or tactile cue for safety and environmental obstacle preparation. Pt. Seated EOB in room at session end. Pt  with all needs within reach. Pt. Stated R should hurt "less than it did, but it still hurts." Rated 10/10 and RN notified.   Therapy Documentation Precautions:  Precautions Precautions: Fall;Cervical Precaution Comments: central cord syndrome Required Braces or Orthoses: Cervical Brace Cervical Brace: Hard collar;At all times Restrictions Weight Bearing Restrictions: No  Pain: Pain Assessment Pain Assessment: 0-10 Pain Score: 10-Worst pain ever Pain Type: Acute pain Pain Location: Shoulder Pain Orientation: Right Pain Descriptors / Indicators: Cramping;Spasm;Throbbing;Sharp;Radiating;Tingling;Shooting Pain Onset: On-going Patients Stated Pain Goal: 0 Pain Intervention(s): RN made aware Multiple Pain Sites: Yes 2nd Pain Site Pain Score: 9 Pain Type: Chronic pain Pain Location: Knee Pain Orientation: Right;Left Pain Descriptors / Indicators: Throbbing;Sharp;Dull;Aching Pain Frequency: Constant Pain Onset: On-going Patient's Stated Pain Goal: 0 Pain Intervention(s): RN made aware  See FIM for current functional status  Therapy/Group: Individual Therapy  Raiford NobleSmith, Leon Montoya R 02/16/2014, 9:01 AM

## 2014-02-16 NOTE — Progress Notes (Signed)
Physical Therapy Make Up Session Note  Patient Details  Name: Gary Davis MRN: [id number] Date of Birth: 10/08/1955  Today's Date: 02/16/2014 PT Individual Time: 1550-1609 PT Individual Time Calculation (min): 19 min (Make up session)  Short Term Goals: Week 1:  PT Short Term Goal 1 (Week 1): Patient will perform bed mobility with mod I. PT Short Term Goal 2 (Week 1): Patient will perform functional transfers with supervision. PT Short Term Goal 3 (Week 1): Patient will ambulate 150' with LRAD and supervision. PT Short Term Goal 4 (Week 1): Patient will negotiate 10 steps with one handrail and supervision.  Skilled Therapeutic Interventions/Progress Updates:     Patient received semi-reclined in bed. Session focused on B UE NMR and STM of upper traps and scapular muscles, emphasis on scapular release of rhomboids. Functional ambulation to/from gym, >150' x2 without AD, and close supervision, intermittent min guard for steadying assist. Quadruped with anterior weight shifts to increase weight bearing through B UEs, progressing to crawling to increase weight bearing in single UE. STM of upper traps and scapular release of rhomboids, patient reports decrease in pain. Education on serratus, rhomboid, and pec stretches to assist in muscle tightness and relieve pain. Patient returned to room and left semi-reclined in bed with heating pad positioned on R shoulder/cervical region.  Therapy Documentation Precautions:  Precautions Precautions: Fall;Cervical Precaution Comments: central cord syndrome Required Braces or Orthoses: Cervical Brace Cervical Brace: Hard collar;At all times Restrictions Weight Bearing Restrictions: No Pain: Pain Assessment Pain Assessment: 0-10 Pain Score: 10-Worst pain ever Pain Type: Chronic pain Pain Location: Neck Pain Orientation: Posterior Pain Radiating Towards: Right shoulder Pain Descriptors / Indicators: Aching;Cramping Pain Frequency:  Intermittent Pain Onset: On-going Locomotion : Ambulation Ambulation/Gait Assistance: 4: Min guard;5: Supervision   See FIM for current functional status  Therapy/Group: Individual Therapy  Gary Davis S. Elven Laboy, PT, DPT 02/16/2014, 4:15 PM

## 2014-02-16 NOTE — Progress Notes (Signed)
Occupational Therapy Session Note  Patient Details  Name: Debbra RidingMichael D Milledge MRN: [id number] Date of Birth: 10/11/1955  Today's Date: 02/16/2014 OT Individual Time: 1100-1200 OT Individual Time Calculation (min): 60 min   Short Term Goals: Week 1:  OT Short Term Goal 1 (Week 1): Pt will complete UB bathing with min assist OT Short Term Goal 2 (Week 1): Pt will complete UB dressing with min assist OT Short Term Goal 3 (Week 1): Pt will complete LB dressing with min assist OT Short Term Goal 4 (Week 1): Pt will complete toilet transfer with supervision OT Short Term Goal 5 (Week 1): Pt will utilize BUE to complete grooming tasks with supervision  Skilled Therapeutic Interventions/Progress Updates:    Pt resting in bed upon arrival.  Pt stated he had already washed up and dressed.  Pt amb with SPC to therapy gym at close supervision level.  Pt exhibited one LOB but was able to self correct.  Kinesio tape applied to right and left upper trapezius, right deltoid, and along medial border of right scapula.  Pt engaged in BUE/hand activities to increase manipulation skills and dexterity.  Pt states that sometime when grasping items he cannot control amount of pressure applied.  Pt returned to bed at end of session and moist heat applied.    Therapy Documentation Precautions:  Precautions Precautions: Fall;Cervical Precaution Comments: central cord syndrome Required Braces or Orthoses: Cervical Brace Cervical Brace: Hard collar;At all times Restrictions Weight Bearing Restrictions: No   Pain: Pain Assessment Pain Assessment: 0-10 Pain Score: 10-Worst pain ever Pain Type: Chronic pain Pain Location: Neck Pain Orientation: Right;Left;Posterior Pain Radiating Towards: right shoulder Pain Descriptors / Indicators: Cramping Pain Onset: On-going Patients Stated Pain Goal: 3 Pain Intervention(s): RN aware; heat applied  See FIM for current functional status  Therapy/Group: Individual  Therapy  Rich BraveLanier, Rosaleigh Brazzel Chappell 02/16/2014, 12:14 PM

## 2014-02-16 NOTE — Progress Notes (Signed)
Subjective/Complaints: Right neck, shoulder still very tender. Heat helps a little. No difference with increased gabapentin  Review of Systems - Negative except constipation and as above  Objective: Vital Signs: Blood pressure 90/53, pulse 56, temperature 98.3 F (36.8 C), temperature source Oral, resp. rate 18, height 6\' 1"  (1.854 m), weight 76.3 kg (168 lb 3.4 oz), SpO2 97.00%. No results found. No results found for this or any previous visit (from the past 72 hour(s)).   HEENT: normal and poor dentition Cardio: RRR and no murmur Resp: CTA B/L and unlabored GI: BS positive and NT, ND Extremity:  Pulses positive and No Edema Skin:   Surgical wound with steristrips Neuro: Alert/Oriented Musc/Skel:  Other bilateral intrinsic hand deformities. Large trap TP on right.  Gen NAD motor strength is 3 minus bilateral deltoid biceps triceps and finger flexion finger extension wrist flexion and wrist extension.  Decreased sensation in bilateral C5 C6-C7 dermatomal distribution  4 minus in the hip flexors knee extensors ankle dorsiflexors and plantar flexors bilaterally  Decreased sensation bilateral big toes   Assessment/Plan: 1. Functional deficits secondary to Central cord syndrome, s/p C3-C7 PSF which require 3+ hours per day of interdisciplinary therapy in a comprehensive inpatient rehab setting. Physiatrist is providing close team supervision and 24 hour management of active medical problems listed below. Physiatrist and rehab team continue to assess barriers to discharge/monitor patient progress toward functional and medical goals. FIM: FIM - Bathing Bathing Steps Patient Completed: Chest;Abdomen;Right Arm;Front perineal area;Buttocks;Right upper leg;Left upper leg;Right lower leg (including foot);Left lower leg (including foot) Bathing: 4: Min-Patient completes 8-9 [id number] 10 parts or 75+ percent  FIM - Upper Body Dressing/Undressing Upper body dressing/undressing steps patient  completed: Thread/unthread right sleeve of pullover shirt/dresss;Thread/unthread left sleeve of pullover shirt/dress;Put head through opening of pull over shirt/dress;Pull shirt over trunk Upper body dressing/undressing: 5: Set-up assist to: Obtain clothing/put away FIM - Lower Body Dressing/Undressing Lower body dressing/undressing steps patient completed: Thread/unthread right underwear leg;Thread/unthread left underwear leg;Thread/unthread right pants leg;Thread/unthread left pants leg;Don/Doff right sock;Don/Doff left sock;Pull underwear up/down;Pull pants up/down Lower body dressing/undressing: 4: Min-Patient completed 75 plus % of tasks  FIM - Toileting Toileting steps completed by patient: Adjust clothing prior to toileting;Performs perineal hygiene;Adjust clothing after toileting Toileting Assistive Devices: Grab bar or rail for support Toileting: 6: Assistive device: No helper  FIM - Diplomatic Services operational officerToilet Transfers Toilet Transfers Assistive Devices: Grab bars;Walker Toilet Transfers: 5-To toilet/BSC: Supervision (verbal cues/safety issues);5-From toilet/BSC: Supervision (verbal cues/safety issues)  FIM - Press photographerBed/Chair Transfer Bed/Chair Transfer Assistive Devices: HOB elevated;Bed rails;Arm rests Bed/Chair Transfer: 5: Bed > Chair or W/C: Supervision (verbal cues/safety issues);5: Chair or W/C > Bed: Supervision (verbal cues/safety issues)  FIM - Locomotion: Wheelchair Distance: 150 Locomotion: Wheelchair: 0: Activity did not occur FIM - Locomotion: Ambulation Locomotion: Ambulation Assistive Devices: Designer, industrial/productWalker - Rolling Ambulation/Gait Assistance: 5: Supervision Locomotion: Ambulation: 5: Travels 150 ft or more with supervision/safety issues  Comprehension Comprehension Mode: Auditory Comprehension: 7-Follows complex conversation/direction: With no assist  Expression Expression Mode: Verbal Expression: 7-Expresses complex ideas: With no assist  Social Interaction Social Interaction:  7-Interacts appropriately with others - No medications needed.  Problem Solving Problem Solving: 7-Solves complex problems: Recognizes & self-corrects  Memory Memory: 7-Complete Independence: No helper  Medical Problem List and Plan:  1. Functional deficits secondary to central cord syndrome status post C3-C7 posterior decompression and fusion. Cervical collar at all times  2. DVT Prophylaxis/Anticoagulation: SCDs. Monitor for any signs of DVT  3. Pain Management: Neurontin for neuropathic pain--  -  however left shoulder and neck pain appear to be post-op and myofascial  -will schedule robaxin, aggressive heat, massage, K-tape, consider TPI  -maintain Oxycodone and Robaxin as needed. Monitor with increased mobility  4. tobacco abuse. NicoDerm patch. Provide counseling  5. Neuropsych: This patient is capable of making decisions on his own behalf.  6. Skin/Wound Care: Routine skin care   LOS (Days) 5 A FACE TO FACE EVALUATION WAS PERFORMED  SWARTZ,ZACHARY T 02/16/2014, 8:28 AM

## 2014-02-17 ENCOUNTER — Inpatient Hospital Stay (HOSPITAL_COMMUNITY): Payer: No Typology Code available for payment source | Admitting: Occupational Therapy

## 2014-02-17 ENCOUNTER — Inpatient Hospital Stay (HOSPITAL_COMMUNITY): Payer: No Typology Code available for payment source

## 2014-02-17 MED ORDER — METHOCARBAMOL 500 MG PO TABS
500.0000 mg | ORAL_TABLET | Freq: Four times a day (QID) | ORAL | Status: DC
  Administered 2014-02-17 – 2014-02-19 (×8): 500 mg via ORAL
  Filled 2014-02-17 (×13): qty 1

## 2014-02-17 NOTE — Progress Notes (Signed)
Subjective/Complaints: Right neck, shoulder improved with meds/massage/k-tape  Review of Systems -had some tingling in right hand this am  Objective: Vital Signs: Blood pressure 109/70, pulse 59, temperature 97.9 F (36.6 C), temperature source Oral, resp. rate 18, height 6\' 1"  (1.854 m), weight 76.3 kg (168 lb 3.4 oz), SpO2 94.00%. No results found. No results found for this or any previous visit (from the past 72 hour(s)).   HEENT: normal and poor dentition Cardio: RRR and no murmur Resp: CTA B/L and unlabored GI: BS positive and NT, ND Extremity:  Pulses positive and No Edema Skin:   Surgical wound with steristrips Neuro: Alert/Oriented Musc/Skel:  Other bilateral intrinsic hand deformities. Large trap TP on right---reduced in size today Gen NAD motor strength is 3 minus bilateral deltoid biceps triceps and finger flexion finger extension wrist flexion and wrist extension.  Decreased sensation in bilateral C5 C6-C7 dermatomal distribution  4 minus in the hip flexors knee extensors ankle dorsiflexors and plantar flexors bilaterally  Decreased sensation bilateral big toes   Assessment/Plan: 1. Functional deficits secondary to Central cord syndrome, s/p C3-C7 PSF which require 3+ hours per day of interdisciplinary therapy in a comprehensive inpatient rehab setting. Physiatrist is providing close team supervision and 24 hour management of active medical problems listed below. Physiatrist and rehab team continue to assess barriers to discharge/monitor patient progress toward functional and medical goals. FIM: FIM - Bathing Bathing Steps Patient Completed: Chest;Abdomen;Right Arm;Front perineal area;Buttocks;Right upper leg;Left upper leg;Right lower leg (including foot);Left lower leg (including foot) Bathing: 4: Min-Patient completes 8-9 51f 10 parts or 75+ percent  FIM - Upper Body Dressing/Undressing Upper body dressing/undressing steps patient completed: Thread/unthread right  sleeve of pullover shirt/dresss;Thread/unthread left sleeve of pullover shirt/dress;Put head through opening of pull over shirt/dress;Pull shirt over trunk Upper body dressing/undressing: 5: Set-up assist to: Obtain clothing/put away FIM - Lower Body Dressing/Undressing Lower body dressing/undressing steps patient completed: Thread/unthread right underwear leg;Thread/unthread left underwear leg;Thread/unthread right pants leg;Thread/unthread left pants leg;Don/Doff right sock;Don/Doff left sock;Pull underwear up/down;Pull pants up/down Lower body dressing/undressing: 4: Min-Patient completed 75 plus % of tasks  FIM - Toileting Toileting steps completed by patient: Adjust clothing prior to toileting;Performs perineal hygiene;Adjust clothing after toileting Toileting Assistive Devices: Grab bar or rail for support Toileting: 6: Assistive device: No helper  FIM - Diplomatic Services operational officer Devices: Grab bars;Walker Toilet Transfers: 5-To toilet/BSC: Supervision (verbal cues/safety issues);5-From toilet/BSC: Supervision (verbal cues/safety issues)  FIM - Press photographer Assistive Devices: HOB elevated;Bed rails Bed/Chair Transfer: 5: Bed > Chair or W/C: Supervision (verbal cues/safety issues);5: Chair or W/C > Bed: Supervision (verbal cues/safety issues);5: Sit > Supine: Supervision (verbal cues/safety issues);5: Supine > Sit: Supervision (verbal cues/safety issues)  FIM - Locomotion: Wheelchair Distance: 500 Locomotion: Wheelchair: 0: Activity did not occur FIM - Locomotion: Ambulation Locomotion: Ambulation Assistive Devices: Other (comment) (no AD) Ambulation/Gait Assistance: 4: Min guard;5: Supervision Locomotion: Ambulation: 4: Travels 150 ft or more with minimal assistance (Pt.>75%)  Comprehension Comprehension Mode: Auditory Comprehension: 7-Follows complex conversation/direction: With no assist  Expression Expression Mode: Verbal Expression:  7-Expresses complex ideas: With no assist  Social Interaction Social Interaction: 7-Interacts appropriately with others - No medications needed.  Problem Solving Problem Solving: 7-Solves complex problems: Recognizes & self-corrects  Memory Memory: 7-Complete Independence: No helper  Medical Problem List and Plan:  1. Functional deficits secondary to central cord syndrome status post C3-C7 posterior decompression and fusion. Cervical collar at all times  2. DVT Prophylaxis/Anticoagulation: SCDs. Monitor for any signs  of DVT  3. Pain Management: Neurontin for neuropathic pain--  -myofascial left shoulder pain---responsive to plan below  -will schedule robaxin, aggressive heat, massage, K-tape, consider TPI if doesn't improve further  -maintain Oxycodone and Robaxin as needed. Monitor with increased mobility  4. tobacco abuse. NicoDerm patch. Provide counseling  5. Neuropsych: This patient is capable of making decisions on his own behalf.  6. Skin/Wound Care: Routine skin care   LOS (Days) 6 A FACE TO FACE EVALUATION WAS PERFORMED  Corie Vavra T 02/17/2014, 9:06 AM

## 2014-02-17 NOTE — Progress Notes (Signed)
Physical Therapy Session Note  Patient Details  Name: Gary Davis MRN: [id number] Date of Birth: 06/21/1956  Today's Date: 02/17/2014 PT Individual Time: 1410-1510 PT Individual Time Calculation (min): 60 min   Short Term Goals: Week 1:  PT Short Term Goal 1 (Week 1): Patient will perform bed mobility with mod I. PT Short Term Goal 2 (Week 1): Patient will perform functional transfers with supervision. PT Short Term Goal 3 (Week 1): Patient will ambulate 150' with LRAD and supervision. PT Short Term Goal 4 (Week 1): Patient will negotiate 10 steps with one handrail and supervision.  Skilled Therapeutic Interventions/Progress Updates:  Pt. Received semi-reclined in bed with heating pad on R shoulder. Pt. Collar fitted with new pads by orthotist with therapist assisting pt. Pt. Room>gym  1:1 Treatment focused on functional gait, balance and transfers with LRAD [facility](SPC) and with HHA. Pt. Assist level is (S) with SPC; patient stated he felt uneasy with balance and AD utilized to provide additional proprioceptive feedback for maintaining steady gait. Treatment consisted of ambulation on controled surfaces with directional changes and environmental obstacles >500 feet with extra time provided for cognitive processing requested task. Stairs 15 with single rail and SPC for functional ambulation up/down with step-over-step ascending and step-to descending at (S). Car transfer training at [facility] height with instruction and demonstration by therapist for proper technique to maintain cervical collar precautions. Pt. C/o R shoulder pain and ischemic compression techniques were provided by the therapist to decrease pain, increase shoulder flexion, and provide increased functional ROM. Pt. Gym>room with SPC at (S).  Pain: see below  Pt. Condition post therapy session: semi-reclined in bed with heat pad applied to R shoulder with all needs within reach. Instructed to contact RN with additional  needs.  Therapy Documentation Precautions:  Precautions Precautions: Fall;Cervical Precaution Comments: central cord syndrome Required Braces or Orthoses: Cervical Brace Cervical Brace: Hard collar;At all times Restrictions Weight Bearing Restrictions: No  Pain: Pain Assessment Pain Assessment: 0-10 Pain Score: 10-Worst pain ever Pain Type: Neuropathic pain Pain Location: Neck Pain Orientation: Right;Posterior;Lateral Pain Descriptors / Indicators: Sharp;Shooting;Spasm Pain Onset: On-going Patients Stated Pain Goal: 2 Pain Intervention(s): RN made aware Multiple Pain Sites: Yes 2nd Pain Site Pain Score: 8 Pain Type: Chronic pain Pain Location: Knee Pain Orientation: Right;Left Pain Radiating Towards: knees and hands, arthritis related Pain Descriptors / Indicators: Throbbing;Dull;Aching Pain Frequency: Constant Pain Onset: On-going Patient's Stated Pain Goal: 0 Pain Intervention(s): RN made aware  See FIM for current functional status  Therapy/Group: Individual Therapy  Raiford NobleSmith, Marsha Hillman R 02/17/2014, 4:20 PM

## 2014-02-17 NOTE — Progress Notes (Signed)
Physical Therapy Session Note  Patient Details  Name: Gary Davis MRN: [id number] Date of Birth: 12/01/1955  Today's Date: 02/17/2014 PT Individual Time: 0900-1000 PT Individual Time Calculation (min): 60 min   Short Term Goals: Week 1:  PT Short Term Goal 1 (Week 1): Patient will perform bed mobility with mod I. PT Short Term Goal 2 (Week 1): Patient will perform functional transfers with supervision. PT Short Term Goal 3 (Week 1): Patient will ambulate 150' with LRAD and supervision. PT Short Term Goal 4 (Week 1): Patient will negotiate 10 steps with one handrail and supervision.  Skilled Therapeutic Interventions/Progress Updates:  1:1. Pt received semi-reclined in bed, ready for therapy. Focus this session on functional ambulation, therex and higher level balance.   Pt req supervision for t/f sup<>sit EOB w/ use of bed rails, toileting and light grooming at sink. Pt amb room<>therapy gym w/out AD, req close(S)-min A due to R toe catch 1x w/ pt able to self correct. Gait characterized by asymmetrical reciprocal pattern and mild medial R foot slap. Pt demonstrates excellent emergent awareness of decreased R dorsiflexion and actively trying to correct throughout session.   Emphasis on R UE ROM and pain via manual scapular release of rhomboids and upper trap progressing to R UE ROM + assisted scapular mobility including protraction/retraction, elevation/depression and upward/downward rotation. Pt verbalized decreased pain and visually noted increased R UE ROM in all planes upon completion.  Pt with overall good tolerance to positioning in quadruped for emphasis on increased WB through B UEs>crawling to target B weight shift in B UE/LE as well as single limb stances. In standing, pt challenged with including heel-toe walking, walking backward on toes and forward on heels to target coordination and higher level balance. Pt req min A for standing balance overall.   Pt left semi-reclined in  bed at end of session w/ heat pack in place, all needs w/in reach and OT in room.   Therapy Documentation Precautions:  Precautions Precautions: Fall;Cervical Precaution Comments: central cord syndrome Required Braces or Orthoses: Cervical Brace Cervical Brace: Hard collar;At all times Restrictions Weight Bearing Restrictions: No Pain: Pain Assessment Pain Assessment: 0-10 Pain Score: 10-Worst pain ever Pain Type: Acute pain Pain Location: Neck Pain Orientation: Right;Posterior Pain Descriptors / Indicators: Aching Pain Onset: On-going Pain Intervention(s): Repositioned;Distraction (pt reported having pain meds prior to session)  See FIM for current functional status  Therapy/Group: Individual Therapy  Denzil HughesKing, Shaneece Stockburger S 02/17/2014, 10:05 AM

## 2014-02-17 NOTE — Patient Care Conference (Signed)
Inpatient RehabilitationTeam Conference and Plan of Care Update Date: 02/17/2014   Time: 2:05 PM    Patient Name: Gary Davis      Medical Record Number: [id number]  Date of Birth: 09/13/1955 Sex: Male         Room/Bed: 4W05C/4W05C-01 Payor Info: Payor: [employer] / Plan: Joette CatchingOVENTRY [employer] / Product Type: *No Product type* /    Admitting Diagnosis: central cord syndrome  Admit Date/Time:  02/11/2014  4:19 PM Admission Comments: No comment available   Primary Diagnosis:  <principal problem not specified> Principal Problem: <principal problem not specified>  Patient Active Problem List   Diagnosis Date Noted  . Central cord syndrome 02/11/2014  . Myelopathy, spondylogenic, cervical 02/09/2014  . Bilateral arm weakness 02/04/2014  . Cord compression syndrome 02/04/2014  . Tobacco use disorder 02/04/2014  . Arthritis 02/04/2014    Expected Discharge Date: Expected Discharge Date: 02/20/14  Team Members Present: Physician leading conference: Dr. Faith RogueZachary Swartz Social Worker Present: Amada JupiterLucy Lourine Alberico, LCSW Nurse Present: Other (comment) Sylvie Farrier(Charlene Wright, RN) PT Present: Edman CircleAudra Hall, PT;Jess Glenetta HewGodfrey, PT OT Present: Donzetta KohutFrank Barthold, OT SLP Present: Feliberto Gottronourtney Payne, SLP PPS Coordinator present : Edson SnowballBecky Windsor, PT     Current Status/Progress Goal Weekly Team Focus  Medical   central cord syndrome. pain hand/wrist weakness  pain control, improve functional use of limbs particularly UE  myofascial managemtn,    Bowel/Bladder   Continent of bladder and bowel, LBM 02/16/14  To remain continent of bladder and bowel  Montior for constipation   Swallow/Nutrition/ Hydration             ADL's   Supervision-min assist  Overall Mod I  dynamic balance, safety awareness, pain management, increased functional use of BUEs and hands   Mobility   Supervision overall  mod I overall, S for car and community ambulation  gait, precautions, safety awareness, endurance/strength, stairs    Communication             Safety/Cognition/ Behavioral Observations            Pain   C/o pain in neck and R shoulder, Robaxin 50mg  QID sch., Oxy IR 20mg  q 4hrs as needed given.  Pain <3  Assess for and treat pain q shift with prn and sch meds.   Skin   Incision posterior neck with steri strips in place CDI  No new skin breakdown  Assess skin q shift    Rehab Goals Patient on target to meet rehab goals: Yes *See Care Plan and progress notes for long and short-term goals.  Barriers to Discharge: hands/wrist weakness    Possible Resolutions to Barriers:  adaptive orthotics/equipment    Discharge Planning/Teaching Needs:  home alone with intermittent assist of family      Team Discussion:  Pain issues still but much improved.  Good progress seen with hands and wrist control/ function.  Aiming for mod i goals with all mobility and ADLs  Revisions to Treatment Plan:  none   Continued Need for Acute Rehabilitation Level of Care: The patient requires daily medical management by a physician with specialized training in physical medicine and rehabilitation for the following conditions: Daily direction of a multidisciplinary physical rehabilitation program to ensure safe treatment while eliciting the highest outcome that is of practical value to the patient.: Yes Daily medical management of patient stability for increased activity during participation in an intensive rehabilitation regime.: Yes Daily analysis of laboratory values and/or radiology reports with any subsequent need for medication adjustment of  medical intervention for : Post surgical problems;Neurological problems  Laramie Meissner 02/18/2014, 12:05 PM

## 2014-02-17 NOTE — Progress Notes (Signed)
Occupational Therapy Session Note  Patient Details  Name: Gary Davis MRN: [id number] Date of Birth: 02/17/1956  Today's Date: 02/17/2014 OT Individual Time: 1000-1100 OT Individual Time Calculation (min): 60 min  Short Term Goals: Week 1:  OT Short Term Goal 1 (Week 1): Pt will complete UB bathing with min assist OT Short Term Goal 2 (Week 1): Pt will complete UB dressing with min assist OT Short Term Goal 3 (Week 1): Pt will complete LB dressing with min assist OT Short Term Goal 4 (Week 1): Pt will complete toilet transfer with supervision OT Short Term Goal 5 (Week 1): Pt will utilize BUE to complete grooming tasks with supervision  Skilled Therapeutic Interventions/Progress Updates:  Patient in bed with PT finishing session by placing heat on patient's right shoulder. Assisted patient to wash back of his neck with head and neck supported due to c/o pain due to "something sticky pulling my hair".  PA approved shower except there are no replacement C-collar pads therefore, completed shower without getting collar and above wet.  Patient performed shower in sit and in stand and had difficulty managing the hand held shower handle.  Patient with right shoulder pain and educated patient on need for him to monitor how often his shoulders are hiked up which contributes to his shoulder/trap pain.  Patient performed all of his bathing and dressing except this OT assisted with the [facility] shower and buttoned his pants.  Therapy Documentation Precautions:  Precautions Precautions: Fall;Cervical Precaution Comments: central cord syndrome Required Braces or Orthoses: Cervical Brace Cervical Brace: Hard collar;At all times Restrictions Weight Bearing Restrictions: No Pain: Right shoulder pain, not rated, heat on shoulder upon arrival and placed on at end of session, soft tissue massage and stretching, cues to decrease shoulder hiking. ADL: See FIM for current functional status  Therapy/Group:  Individual Therapy  Keiona Jenison 02/17/2014, 9:22 AM

## 2014-02-18 ENCOUNTER — Encounter (HOSPITAL_COMMUNITY): Payer: No Typology Code available for payment source | Admitting: Occupational Therapy

## 2014-02-18 ENCOUNTER — Inpatient Hospital Stay (HOSPITAL_COMMUNITY): Payer: No Typology Code available for payment source | Admitting: Physical Therapy

## 2014-02-18 ENCOUNTER — Inpatient Hospital Stay (HOSPITAL_COMMUNITY): Payer: No Typology Code available for payment source | Admitting: Occupational Therapy

## 2014-02-18 DIAGNOSIS — G8250 Quadriplegia, unspecified: Secondary | ICD-10-CM

## 2014-02-18 NOTE — Progress Notes (Signed)
Occupational Therapy Session Note  Patient Details  Name: Debbra RidingMichael D Geron MRN: [id number] Date of Birth: 07/01/1956  Today's Date: 02/18/2014 OT Individual Time: [id number] OT Individual Time Calculation (min): 62 min  Skilled Therapeutic Interventions/Progress Updates:    Pt donned and tied his shoes sitting EOB by bringing them up onto the bed to begin the session.  He needed 2 attempts for each shoe to complete the task.  Once finished pt ambulated to the ADL apartment for education and practice on home management tasks adhering to his cervical precautions.  Pt educated on the proper technique for using vacuum cleaner, sweeping, and mopping floor with pt actually performing pieces of the task while adhering to precautions.  Educated pt on holding the vacuum cleaner with both hands and walking it forward in small areas instead of pushing it with one UE while reaching out.  Same technique utilized for sweeping and simulated mopping as well.  Also educated pt on organizing items in the kitchen as they can be accessed easily but to also have a reacher to use for retrieving light items from upper or lower cabinets as well as reaching down to the floor for picking up items.  Pt plans to have his brother assist with setup of kitchen items.  Discussed laundry management as well and pt states they have a cart available that he can push his laundry down to the laundry room.  He also has neighbors that may help as well.  Discussed use of there reacher for retrieving items from the washer and dryer as well and to be aware that larger articles of clothing can be heavy when wet, so avoid trying to place multiple items at one time from the washer to the dryer  [place] with practicing tub/shower transfer with supervision.  Pt demonstrates slight difficulty lifting LEs over the edge of the tub, especially the RLE.  Feel he will benefit from tub bench to increase safety.  Pt also agrees so will have Case  Manager order for discharge.  Pt with no LOB during session.   Therapy Documentation Precautions:  Precautions Precautions: Fall;Cervical Precaution Comments: central cord syndrome Required Braces or Orthoses: Cervical Brace Cervical Brace: Hard collar;At all times Restrictions Weight Bearing Restrictions: No  Vital Signs: Therapy Vitals Temp: 98.2 F (36.8 C) Temp src: Oral Pulse Rate: 68 Resp: 17 BP: 106/75 mmHg Patient Position (if appropriate): Lying Oxygen Therapy SpO2: 98 % O2 Device: None (Room air) Pain: Pain Assessment Pain Assessment: Faces Faces Pain Scale: Hurts a little bit Pain Type: Acute pain Pain Location: Shoulder Pain Orientation: Right Pain Descriptors / Indicators:  (trigger point in upper trap) Pain Intervention(s): Repositioned;Massage Multiple Pain Sites: No ADL: See FIM for current functional status  Therapy/Group: Individual Therapy  Kisean Rollo OTR/L 02/18/2014, 4:29 PM

## 2014-02-18 NOTE — Progress Notes (Signed)
Physical Therapy Session Note  Patient Details  Name: Gary Davis MRN: [id number] Date of Birth: 05/09/1956  Today's Date: 02/18/2014 PT Individual Time: 0906-1010 PT Individual Time Calculation (min): 64 min   Short Term Goals: Week 1:  PT Short Term Goal 1 (Week 1): Patient will perform bed mobility with mod I. PT Short Term Goal 2 (Week 1): Patient will perform functional transfers with supervision. PT Short Term Goal 3 (Week 1): Patient will ambulate 150' with LRAD and supervision. PT Short Term Goal 4 (Week 1): Patient will negotiate 10 steps with one handrail and supervision.  Skilled Therapeutic Interventions/Progress Updates:   Pt received in bed with Kpad in place on R shoulder; pt reports muscle pain under scapula has improved but reports increased pain in R upper trap.  Pt agreeable to try TENS unit for pain and UE exercises.  Performed NMR for UE; see below for details.  Performed gait x 150' in controlled environment with supervision with verbal discussion about correct placement of SPC for maximum support; pt presents with R side weakness and knee OA pain but wishes to carry the [facility] the R.  He states that [facility] yesterday but he feels more secure and comfortable with the SPC in the RUE.  Discussed with pt sequence of stair negotiation with SPC and while carrying light bag of trash to disposal.  Pt performed stair negotiation up/down 12 stairs with R rail and trash in LUE to ascend; also performed second repetition with pt carrying SPC and trash in LUE with rail in RUE to ascend; both required supervision and verbal cues to perform safe sequence.  Also performed functional tasks during gait such as opening doors.  Returned to gym and performed functional floor > furniture transfer training; pt able to verbalize and demonstrate safe floor > furniture transfer mod I.  Returned to room and discussed D/C date and Mod I in room tomorrow.  Pt agreeable to plan.     Therapy Documentation Precautions:  Precautions Precautions: Fall;Cervical Precaution Comments: central cord syndrome Required Braces or Orthoses: Cervical Brace Cervical Brace: Hard collar;At all times Restrictions Weight Bearing Restrictions: No Pain:  Reports pain 10/10 in R shoulder; RN aware and medication administered by RN. Locomotion : Ambulation Ambulation/Gait Assistance: 5: Supervision  Other Treatments: Treatments Neuromuscular Facilitation: Upper Extremity;Activity to increase motor control;Activity to increase grading;Activity to increase sustained activation in sitting and supine while TENS applied for pain management of R shoulder; performed grading of upper trap activation during scapular retraction and depression without >> with UE flexion and UE ABD x 8 reps each; in supine performed isometric activation of deep neck flexors and core for stabilization during functional scapulohumeral rhythm and minimize overcompensation of upper trap.    Modalities Modalities: Copywriter, advertisinglectrical Stimulation Electrical Stimulation Electrical Stimulation Location: TENS to R upper trap and levator scapulae Electrical Stimulation Action: sensory level for pain management  Electrical Stimulation Parameters: See TENS unit Electrical Stimulation Goals: Pain  See FIM for current functional status  Therapy/Group: Individual Therapy  Gary Davis, Gary Davis 02/18/2014, 10:24 AM

## 2014-02-18 NOTE — Progress Notes (Signed)
Subjective/Complaints: Right shoulder still sore but overall improved. kpad helpful  Review of Systems -had some tingling in right hand this am  Objective: Vital Signs: Blood pressure 118/65, pulse 49, temperature 97.5 F (36.4 C), temperature source Tympanic, resp. rate 18, height 6\' 1"  (1.854 m), weight 76.3 kg (168 lb 3.4 oz), SpO2 99.00%. No results found. No results found for this or any previous visit (from the past 72 hour(s)).   HEENT: normal and poor dentition Cardio: RRR and no murmur Resp: CTA B/L and unlabored GI: BS positive and NT, ND Extremity:  Pulses positive and No Edema Skin:   Surgical wound with steristrips Neuro: Alert/Oriented Musc/Skel:  Other bilateral intrinsic hand deformities. Large trap TP on right---reduced in size today Gen NAD motor strength is 3 minus bilateral deltoid biceps triceps and finger flexion finger extension wrist flexion and wrist extension.  Decreased sensation in bilateral C5 C6-C7 dermatomal distribution  4 minus in the hip flexors knee extensors ankle dorsiflexors and plantar flexors bilaterally  Decreased sensation bilateral big toes   Assessment/Plan: 1. Functional deficits secondary to Central cord syndrome, s/p C3-C7 PSF which require 3+ hours per day of interdisciplinary therapy in a comprehensive inpatient rehab setting. Physiatrist is providing close team supervision and 24 hour management of active medical problems listed below. Physiatrist and rehab team continue to assess barriers to discharge/monitor patient progress toward functional and medical goals.  Making nice progress overall. Working towards Friday discharge  FIM: FIM - Bathing Bathing Steps Patient Completed: Chest;Abdomen;Front perineal area;Buttocks;Right upper leg;Left upper leg;Right lower leg (including foot);Left lower leg (including foot);Left Arm;Right Arm Bathing: 6: Assistive device (Comment)  FIM - Upper Body Dressing/Undressing Upper body  dressing/undressing steps patient completed: Thread/unthread right sleeve of pullover shirt/dresss;Thread/unthread left sleeve of pullover shirt/dress;Put head through opening of pull over shirt/dress;Pull shirt over trunk Upper body dressing/undressing: 7: Complete Independence: No helper FIM - Lower Body Dressing/Undressing Lower body dressing/undressing steps patient completed: Thread/unthread right underwear leg;Thread/unthread left underwear leg;Thread/unthread right pants leg;Thread/unthread left pants leg;Don/Doff right sock;Don/Doff left sock;Pull underwear up/down;Pull pants up/down;Don/Doff right shoe;Don/Doff left shoe;Fasten/unfasten right shoe;Fasten/unfasten left shoe Lower body dressing/undressing: 6: Assistive device (Comment)  FIM - Toileting Toileting steps completed by patient: Adjust clothing prior to toileting;Performs perineal hygiene;Adjust clothing after toileting Toileting Assistive Devices: Grab bar or rail for support Toileting: 5: Supervision: Safety issues/verbal cues  FIM - Diplomatic Services operational officerToilet Transfers Toilet Transfers Assistive Devices: Grab bars Toilet Transfers: 6-Assistive device: No helper  FIM - BankerBed/Chair Transfer Bed/Chair Transfer Assistive Devices: Bed rails Bed/Chair Transfer: 5: Bed > Chair or W/C: Supervision (verbal cues/safety issues);5: Chair or W/C > Bed: Supervision (verbal cues/safety issues);5: Sit > Supine: Supervision (verbal cues/safety issues);5: Supine > Sit: Supervision (verbal cues/safety issues)  FIM - Locomotion: Wheelchair Distance: 500 Locomotion: Wheelchair: 0: Activity did not occur FIM - Locomotion: Ambulation Locomotion: Ambulation Assistive Devices: Emergency planning/management officerCane - Straight Ambulation/Gait Assistance: 5: Supervision Locomotion: Ambulation: 5: Travels 150 ft or more with supervision/safety issues  Comprehension Comprehension Mode: Auditory Comprehension: 7-Follows complex conversation/direction: With no assist  Expression Expression Mode:  Verbal Expression: 7-Expresses complex ideas: With no assist  Social Interaction Social Interaction: 7-Interacts appropriately with others - No medications needed.  Problem Solving Problem Solving: 7-Solves complex problems: Recognizes & self-corrects  Memory Memory: 7-Complete Independence: No helper  Medical Problem List and Plan:  1. Functional deficits secondary to central cord syndrome status post C3-C7 posterior decompression and fusion. Cervical collar at all times  2. DVT Prophylaxis/Anticoagulation: SCDs. Monitor for any signs of DVT  3. Pain Management: Neurontin for neuropathic pain--  -myofascial right shoulder pain---responsive to plan below  -will schedule robaxin, aggressive heat, massage, K-tape, consider TPI if doesn't improve further  -maintain Oxycodone and Robaxin as needed. Monitor with increased mobility  4. tobacco abuse. NicoDerm patch. Provide counseling  5. Neuropsych: This patient is capable of making decisions on his own behalf.  6. Skin/Wound Care: Routine skin care   LOS (Days) 7 A FACE TO FACE EVALUATION WAS PERFORMED  SWARTZ,ZACHARY T 02/18/2014, 8:17 AM

## 2014-02-18 NOTE — Progress Notes (Signed)
Occupational Therapy Session Note  Patient Details  Name: Gary RidingMichael D Kocher MRN: [id number] Date of Birth: 01/06/1956  Today's Date: 02/18/2014 OT Individual Time: [id number] OT Individual Time Calculation (min): 60 min   Short Term Goals: Week 1:  OT Short Term Goal 1 (Week 1): Pt will complete UB bathing with min assist OT Short Term Goal 2 (Week 1): Pt will complete UB dressing with min assist OT Short Term Goal 3 (Week 1): Pt will complete LB dressing with min assist OT Short Term Goal 4 (Week 1): Pt will complete toilet transfer with supervision OT Short Term Goal 5 (Week 1): Pt will utilize BUE to complete grooming tasks with supervision  Skilled Therapeutic Interventions/Progress Updates:  Patient received supine in bed with heat pad donned > right shoulder and patient with 10/10 complaints of pain in R shoulder. Therapist assisted through re-positioning patient and notified RN. Patient engaged in bed mobility at mod I level, transferred OOB, and ambulated around room without an AD to gather necessary items for ADL. Therapist worked on challenging patient during functional mobility and dynamic standing.  Patient transferred into shower and completed UB/LB bathing in sit<>stand position using grab bars and tub bench as needed for safety. Patient with new aspen collar pads, therefore patient eager to wash hair. Therapist talked with patient about d/c > home independently; patient problem solved independently and stated he wouldn't get pads wet if no one home to assist with changing pads after shower. Patient worked on washing hair and required hand over hand assist secondary to decreased strength throughout bilateral shoulders. Other bathing aspects completed at mod I level. Patient completed LB dressing in sit<>stand position Patient then ambulated > bed, laid in supine position and therapist changed out pads with patient in supine position. Patient then sat EOB to apply lotion and don shirt.  Patient ambulated around room for other hygiene aspects at mod I level, then sat EOB for breakfast. Therapist discussed IADLs and community re-entry with patient as well. At end of session, left patient seated EOB with all needs within reach.   Precautions:  Precautions Precautions: Fall;Cervical Precaution Comments: central cord syndrome Required Braces or Orthoses: Cervical Brace Cervical Brace: Hard collar;At all times Restrictions Weight Bearing Restrictions: No  See FIM for current functional status  Therapy/Group: Individual Therapy  Treena Cosman 02/18/2014, 8:31 AM

## 2014-02-18 NOTE — Plan of Care (Signed)
Problem: RH PAIN MANAGEMENT Goal: RH STG PAIN MANAGED AT OR BELOW PT'S PAIN GOAL Less or equal to 7  Outcome: Not Progressing Rates pain at 10 all times

## 2014-02-18 NOTE — Progress Notes (Signed)
  Patient request pain med to be given every 4 hours and to be awakened even if asleep to administer pain medication. Patient states pain remains at a 10/10 even after pain med intervention.  Diamantina MonksARPENTER,Primus Gritton, RN

## 2014-02-19 ENCOUNTER — Inpatient Hospital Stay (HOSPITAL_COMMUNITY): Payer: No Typology Code available for payment source

## 2014-02-19 ENCOUNTER — Inpatient Hospital Stay (HOSPITAL_COMMUNITY): Payer: No Typology Code available for payment source | Admitting: Physical Therapy

## 2014-02-19 ENCOUNTER — Inpatient Hospital Stay (HOSPITAL_COMMUNITY): Payer: No Typology Code available for payment source | Admitting: Occupational Therapy

## 2014-02-19 MED ORDER — METHOCARBAMOL 750 MG PO TABS
750.0000 mg | ORAL_TABLET | Freq: Four times a day (QID) | ORAL | Status: DC
  Administered 2014-02-19 – 2014-02-20 (×6): 750 mg via ORAL
  Filled 2014-02-19 (×8): qty 1

## 2014-02-19 MED ORDER — DEXAMETHASONE 2 MG PO TABS
2.0000 mg | ORAL_TABLET | Freq: Every day | ORAL | Status: DC
  Administered 2014-02-20: 2 mg via ORAL
  Filled 2014-02-19 (×2): qty 1

## 2014-02-19 NOTE — Progress Notes (Signed)
Occupational Therapy Discharge Summary  Patient Details  Name: Gary Davis MRN: [id number] Date of Birth: Nov 07, 1955  Today's Date: 02/20/2014  Patient has met 12 of 12 long term goals due to improved activity tolerance, improved balance, postural control, ability to compensate for deficits and functional use of  RIGHT upper and LEFT upper extremity.  Patient to discharge at overall Modified Independent level.  Patient is independent guiding a caregiver when assistance is needed upon discharge,  Reasons goals not met: n/a secondary to all goals met  Recommendation:  Patient will benefit from ongoing skilled OT services in outpatient setting to continue to advance functional skills in the area of BADL and iADL.  Feel pt needs continued outpatient OT to increase bilateral UE strengthening, coordination in order to reach independent level for all ADLs and IADLs.    Equipment: recommend tub bench to improve safety and independence  Reasons for discharge: treatment goals met and discharge from hospital  Patient/family agrees with progress made and goals achieved: Yes  OT session:  10:01-11:02 Pt performed shower, dressing, and grooming tasks during session.  He was able to perform all aspects at a modified independent level except for changing the pads of his cervical collar.  He needed assistance from the therapist to perform this task, however he was able to verbally guide the therapist through the process.  Pt was able to thread his belt through his pants loops as well after donning them.  Pt usually donns the belt before donning the pants but didn't notice that it was not correct initially and wanted to try placing the belt without having to take the pants off.  He needed increased time as well to tie shoes and donn socks but was able to perform with increased time, crossing LEs up on the bed.  Educated pt on correct technique for brushing his teeth following cervical precautions and to  use the basin to spit in instead of bending forward and attempting to spit in the sink.  Continued session with ambulation down to the ADL apartment using his cane.  Pt with one episode of dragging his left foot but able to correct slight LOB without assistance.  Educated pt on the correct technique for transferring into and out of the bed maintaining cervical precautions.  Finished session with mobility back to the room and verbally reviewed FM coordination exercises and theraputty exercises.  OT Discharge Precautions/Restrictions  Precautions Precautions: Fall;Cervical Precaution Comments: central cord syndrome Required Braces or Orthoses: Cervical Brace Cervical Brace: Hard collar;At all times Restrictions Weight Bearing Restrictions: No Pain Pt reports pain 9/10 in his neck ADL See FIM for updates, overall Mod I Vision/Perception  Vision- History Baseline Vision/History: Wears glasses Wears Glasses:  (when I need them) Vision- Assessment Vision Assessment?: No apparent visual deficits  Cognition Overall Cognitive Status: Within Functional Limits for tasks assessed Arousal/Alertness: Awake/alert Orientation Level: Oriented X4 Memory: Appears intact Awareness: Appears intact Problem Solving: Appears intact Safety/Judgment: Appears intact Sensation Sensation Light Touch: Impaired Detail Light Touch Impaired Details: Impaired RUE;Impaired LUE Stereognosis: Impaired Detail Stereognosis Impaired Details: Impaired LUE;Impaired RUE Hot/Cold: Appears Intact Coordination Gross Motor Movements are Fluid and Coordinated: No Fine Motor Movements are Fluid and Coordinated: No Coordination and Movement Description: decreased speed and accuracy of movements in B UE yet with minimal improvements Motor  Motor Motor: Abnormal tone;Tetraplegia Motor - Discharge Observations: Central cord; UE > LE weakness Mobility  Bed Mobility Bed Mobility: Supine to Sit;Sit to Supine Supine to Sit:  6:  Modified independent (Device/Increase time);HOB flat Supine to Sit Details: Verbal cues for technique Sit to Supine: 6: Modified independent (Device/Increase time);HOB flat Transfers Sit to Stand: From bed;6: Modified independent (Device/Increase time);From toilet Stand to Sit: 6: Modified independent (Device/Increase time);To toilet  Trunk/Postural Assessment  Cervical Assessment Cervical Assessment: Exceptions to [facility] Cervical AROM Overall Cervical AROM Comments: Pt with limited cervical testing secondary to hard cervical collar at all times. Thoracic Assessment Thoracic Assessment: Within Functional Limits Lumbar Assessment Lumbar Assessment: Within Functional Limits  Balance Standardized Balance Assessment Standardized Balance Assessment: Berg Balance Test Berg Balance Test Sit to Stand: Able to stand without using hands and stabilize independently Standing Unsupported: Able to stand safely 2 minutes Sitting with Back Unsupported but Feet Supported on Floor or Stool: Able to sit safely and securely 2 minutes Stand to Sit: Sits safely with minimal use of hands Transfers: Able to transfer safely, minor use of hands Standing Unsupported with Eyes Closed: Able to stand 10 seconds with supervision Standing Ubsupported with Feet Together: Able to place feet together independently and stand 1 minute safely From Standing, Reach Forward with Outstretched Arm: Can reach confidently >25 cm (10") From Standing Position, Pick up Object from Floor: Able to pick up shoe, needs supervision From Standing Position, Turn to Look Behind Over each Shoulder: Looks behind from both sides and weight shifts well Turn 360 Degrees: Able to turn 360 degrees safely but slowly Standing Unsupported, Alternately Place Feet on Step/Stool: Able to stand independently and safely and complete 8 steps in 20 seconds Standing Unsupported, One Foot in Front: Able to place foot tandem independently and hold 30  seconds Standing on One Leg: Able to lift leg independently and hold > 10 seconds Total Score: 52 Static Sitting Balance Static Sitting - Balance Support: Bilateral upper extremity supported;No upper extremity supported;Feet supported Static Sitting - Level of Assistance: 6: Modified independent (Device/Increase time) Static Standing Balance Static Standing - Balance Support: No upper extremity supported Static Standing - Level of Assistance: 6: Modified independent (Device/Increase time) Extremity/Trunk Assessment RUE Assessment RUE Assessment: Exceptions to [facility] RUE AROM (degrees) Right Shoulder Flexion: 100 Degrees Right Shoulder ABduction: 90 Degrees Right Thumb Opposition: Digit 2;Digit 3;Digit 4;Digit 5 RUE Strength Gross Grasp: Impaired Grip (lbs): 37 Lateral Pinch: 13 lbs 3 Point Pinch: 10 lbs LUE Assessment LUE Assessment: Exceptions to [facility] LUE AROM (degrees) Left Shoulder Flexion: 100 Degrees Left Shoulder ABduction: 100 Degrees Left Thumb Opposition: Digit 2;Digit 3;Digit 5;Digit 4 LUE Strength Gross Grasp: Impaired Grip (lbs): 50 Lateral Pinch: 21 lbs 3 Point Pinch: 7.5 lbs  See FIM for current functional status  Treylen Gibbs OTR/L 02/20/2014, 3:28 PM

## 2014-02-19 NOTE — Progress Notes (Signed)
Occupational Therapy Session Note  Patient Details  Name: Gary RidingMichael D Lenger MRN: [id number] Date of Birth: 08/14/1955  Today's Date: 02/19/2014 OT Individual Time: 1430-1500 OT Individual Time Calculation (min): 30 min   Short Term Goals: Week 1:  OT Short Term Goal 1 (Week 1): Pt will complete UB bathing with min assist OT Short Term Goal 2 (Week 1): Pt will complete UB dressing with min assist OT Short Term Goal 3 (Week 1): Pt will complete LB dressing with min assist OT Short Term Goal 4 (Week 1): Pt will complete toilet transfer with supervision OT Short Term Goal 5 (Week 1): Pt will utilize BUE to complete grooming tasks with supervision  Skilled Therapeutic Interventions/Progress Updates:  Upon entering the room, pt supine in bed awaiting pt arrival. Pt with reports of 10/10 pain in R shoulder this session. OT utilizing manual technique to address trigger points in R trapezius. Pt reports a decrease in pain in this area. During this time pt demonstrated the ability to button 8 small buttons on male dress shirt and unbutton as well with increased time. Pt able to complete task with greater ease since this therapist has seen patient. OT educated pt on tub transfer bench DME. Pt has this piece of equipment in room and therapist set up equipment based on therapist description of direction of shower head. OT also discussed how to utilize shower curtain with bench in order to decrease water on floor. OT discussed fall risk considerations within the home and bathroom with pt as well. Pt verbalizing understanding and with no additional questions regarding equipment or concerns at this time.   Therapy Documentation Precautions:  Precautions Precautions: Fall;Cervical Precaution Comments: central cord syndrome Required Braces or Orthoses: Cervical Brace Cervical Brace: Hard collar;At all times Restrictions Weight Bearing Restrictions: No Pain:  Pt reports 10/10 pain in R shoulder described  as ache and "nerves". Pt given pain medication while therapist in the room.    See FIM for current functional status  Therapy/Group: Individual Therapy  Lowella Gripittman, Dessie Delcarlo L 02/19/2014, 4:15 PM

## 2014-02-19 NOTE — Progress Notes (Signed)
Subjective/Complaints: Right shoulder better. But still tender. Likes heat. Muscle relaxant helps  Review of Systems -had some tingling in right hand this am  Objective: Vital Signs: Blood pressure 113/71, pulse 62, temperature 98.1 F (36.7 C), temperature source Oral, resp. rate 18, height 6\' 1"  (1.854 m), weight 82.01 kg (180 lb 12.8 oz), SpO2 100.00%. No results found. No results found for this or any previous visit (from the past 72 hour(s)).   HEENT: normal and poor dentition Cardio: RRR and no murmur Resp: CTA B/L and unlabored GI: BS positive and NT, ND Extremity:  Pulses positive and No Edema Skin:   Surgical wound with steristrips Neuro: Alert/Oriented Musc/Skel:  Other bilateral intrinsic hand deformities. Large trap trigger point resolved. Area around surgical site still swollen somewhat---some associate paraspinal spasm Gen NAD motor strength is 3 minus bilateral deltoid biceps triceps and finger flexion finger extension wrist flexion and wrist extension.  Decreased sensation in bilateral C5 C6-C7 dermatomal distribution  4 minus in the hip flexors knee extensors ankle dorsiflexors and plantar flexors bilaterally  Decreased sensation bilateral big toes   Assessment/Plan: 1. Functional deficits secondary to Central cord syndrome, s/p C3-C7 PSF which require 3+ hours per day of interdisciplinary therapy in a comprehensive inpatient rehab setting. Physiatrist is providing close team supervision and 24 hour management of active medical problems listed below. Physiatrist and rehab team continue to assess barriers to discharge/monitor patient progress toward functional and medical goals.  Making nice progress overall. Working towards Friday discharge. I would prefer outpt therapy for him if feasible.   FIM: FIM - Bathing Bathing Steps Patient Completed: Chest;Abdomen;Front perineal area;Buttocks;Right upper leg;Left upper leg;Right lower leg (including foot);Left lower  leg (including foot);Left Arm;Right Arm Bathing: 6: Assistive device (Comment)  FIM - Upper Body Dressing/Undressing Upper body dressing/undressing steps patient completed: Thread/unthread right sleeve of pullover shirt/dresss;Thread/unthread left sleeve of pullover shirt/dress;Put head through opening of pull over shirt/dress;Pull shirt over trunk Upper body dressing/undressing: 7: Complete Independence: No helper FIM - Lower Body Dressing/Undressing Lower body dressing/undressing steps patient completed: Thread/unthread right underwear leg;Thread/unthread left underwear leg;Thread/unthread right pants leg;Thread/unthread left pants leg;Don/Doff right sock;Don/Doff left sock;Pull underwear up/down;Pull pants up/down;Don/Doff right shoe;Don/Doff left shoe;Fasten/unfasten right shoe;Fasten/unfasten left shoe Lower body dressing/undressing: 6: Assistive device (Comment)  FIM - Toileting Toileting steps completed by patient: Adjust clothing prior to toileting;Performs perineal hygiene;Adjust clothing after toileting Toileting Assistive Devices: Grab bar or rail for support Toileting: 5: Supervision: Safety issues/verbal cues  FIM - Diplomatic Services operational officerToilet Transfers Toilet Transfers Assistive Devices: Grab bars Toilet Transfers: 6-Assistive device: No helper  FIM - BankerBed/Chair Transfer Bed/Chair Transfer Assistive Devices: Bed rails Bed/Chair Transfer: 6: Supine > Sit: No assist;6: Sit > Supine: No assist;5: Bed > Chair or W/C: Supervision (verbal cues/safety issues);5: Chair or W/C > Bed: Supervision (verbal cues/safety issues)  FIM - Locomotion: Wheelchair Distance: 500 Locomotion: Wheelchair: 0: Activity did not occur FIM - Locomotion: Ambulation Locomotion: Ambulation Assistive Devices: Emergency planning/management officerCane - Straight Ambulation/Gait Assistance: 5: Supervision Locomotion: Ambulation: 5: Travels 150 ft or more with supervision/safety issues  Comprehension Comprehension Mode: Auditory Comprehension: 7-Follows complex  conversation/direction: With no assist  Expression Expression Mode: Verbal Expression: 7-Expresses complex ideas: With no assist  Social Interaction Social Interaction: 7-Interacts appropriately with others - No medications needed.  Problem Solving Problem Solving: 7-Solves complex problems: Recognizes & self-corrects  Memory Memory: 7-Complete Independence: No helper  Medical Problem List and Plan:  1. Functional deficits secondary to central cord syndrome status post C3-C7 posterior decompression and fusion. Cervical  collar at all times  2. DVT Prophylaxis/Anticoagulation: SCDs. Monitor for any signs of DVT  3. Pain Management: Neurontin for neuropathic pain--  -myofascial right shoulder pain---responsive to plan below  -increase scheduled robaxin to 750mg  q6, continue aggressive heat (can alternate with ice), massage, K-tape,    -maintain Oxycodone   as needed.   4. tobacco abuse. NicoDerm patch. Provide counseling  5. Neuropsych: This patient is capable of making decisions on his own behalf.  6. Skin/Wound Care: Routine skin care. Surgical site looks great   LOS (Days) 8 A FACE TO FACE EVALUATION WAS PERFORMED  Gary Davis T 02/19/2014, 8:06 AM

## 2014-02-19 NOTE — Progress Notes (Signed)
Physical Therapy Session Note  Patient Details  Name: Gary Davis MRN: [id number] Date of Birth: 03/10/1956  Today's Date: 02/19/2014 PT Individual Time: 1532-1602 PT Individual Time Calculation (min): 30 min   Short Term Goals: Week 1:  PT Short Term Goal 1 (Week 1): Patient will perform bed mobility with mod I. PT Short Term Goal 2 (Week 1): Patient will perform functional transfers with supervision. PT Short Term Goal 3 (Week 1): Patient will ambulate 150' with LRAD and supervision. PT Short Term Goal 4 (Week 1): Patient will negotiate 10 steps with one handrail and supervision.  Skilled Therapeutic Interventions/Progress Updates:   Pt received semi reclined in bed, agreeable to therapy. Pt donned shoes in long sit position and required assist to tie 1 shoe, then transferred supine > sit with mod I. Session focused on gait training in community and outdoor ambulation with use of SPC > 1000 ft, supervision with 1 LOB in controlled environment on rehab unit, able to recover independently, and progressed to mod I with ambulation on/off elevators, over thresholds, on a variety of surfaces including carpet, tile, brick, and concrete, inclines/declines, in mildly crowded hospital lobby, and up/down 5 brick steps with 1 rail and SPC. Patient required 2 seated rest breaks. Pt returned to room and left sitting on bed, all needs within reach and mod I in room with use of SPC.   Therapy Documentation Precautions:  Precautions Precautions: Fall;Cervical Precaution Comments: central cord syndrome Required Braces or Orthoses: Cervical Brace Cervical Brace: Hard collar;At all times Restrictions Weight Bearing Restrictions: No Vital Signs: Therapy Vitals Temp: 98.6 F (37 C) Temp src: Oral Pulse Rate: 64 Resp: 17 BP: 126/76 mmHg Patient Position (if appropriate): Lying Oxygen Therapy SpO2: 100 % O2 Device: None (Room air) Pain: Pain Assessment Pain Assessment: 0-10 Pain Score: 9   Pain Type: Surgical pain Pain Location: Shoulder Pain Orientation: Right Pain Descriptors / Indicators: Aching Pain Onset: On-going Pain Intervention(s): Rest Locomotion : Ambulation Ambulation/Gait Assistance: 6: Modified independent (Device/Increase time);5: Supervision   See FIM for current functional status  Therapy/Group: Individual Therapy  Kerney ElbeVarner, Lesleyanne Politte A 02/19/2014, 4:50 PM

## 2014-02-19 NOTE — Progress Notes (Signed)
Physical Therapy Session Note  Patient Details  Name: Gary Davis MRN: [id number] Date of Birth: 04/27/1956  Today's Date: 02/19/2014 PT Individual Time: 1005-1105 PT Individual Time Calculation (min): 60 min   Short Term Goals: Week 1:  PT Short Term Goal 1 (Week 1): Patient will perform bed mobility with mod I. PT Short Term Goal 2 (Week 1): Patient will perform functional transfers with supervision. PT Short Term Goal 3 (Week 1): Patient will ambulate 150' with LRAD and supervision. PT Short Term Goal 4 (Week 1): Patient will negotiate 10 steps with one handrail and supervision. Week 2: = LTGs due to LOS    Skilled Therapeutic Interventions/Progress Updates:   Pt resting in bed, K pad on R neck, pain rated10/10.  Discussed with RN, premedicated. Pt agreeable to participate.  Toilet transfer in room modified independent. Gait in room without AD briefly, safe, modified independent.  Gait in hallway with SPC in R hand, x 150' with 3 instances of R foot catching floor, tripping.  Pt blamed bil knee OA which he states is bad because of the weather.  He stated no pain oral meds help, and he was not receptive to trying a topical med.   Gait to/from other gym x 200' to simulated car, safely without tripping. Up/down 10 + 7 steps with R rail, cane in L hand, with varying sequencing, but safely modified independent.  Simulated truck transfer to high seat, modified independent, managing SPC and bil LEs well.  Floor transfer to floor mat modified independent.  Therapeutic activity to challenge dynamic standing/transporting objects: without AD, step forward 2 steps/backward 2 steps while retrieving horseshoes x 10 and placing at shoulder height level.  No LOB or catching R foot; no increase in R shoulder pain with this activity.    Discussed d/c home: pt recognizes that he has risks with ambulation due to bil knee OA, but feels he can handle it due to his previous level of function.   Therapist recommends supervision for ambulation for safety given performance this session.    Therapy Documentation Precautions:  Precautions Precautions: Fall;Cervical Precaution Comments: central cord syndrome Required Braces or Orthoses: Cervical Brace Cervical Brace: Hard collar;At all times Restrictions Weight Bearing Restrictions: No      See FIM for current functional status  Therapy/Group: Individual Therapy  Cletis Clack 02/19/2014, 4:12 PM

## 2014-02-19 NOTE — Progress Notes (Signed)
Social Work Patient ID: Gary Davis, male   DOB: Jun 07, 1956, 58 y.o.   MRN: [id number]  Met with pt yesterday to review team conference.  Pt was aware and agreeable with his targeted d/c date of 8/21.  He feels he has made excellent progress here on CIR and denies any concern of managing independently at home.  Need to discuss f/u tx further with pt dependent on transportation support available.  Also working to find a primary MD for pt. Continue to follow.  Gisel Vipond, LCSW

## 2014-02-19 NOTE — Progress Notes (Signed)
Occupational Therapy Session Note  Patient Details  Name: Gary RidingMichael D Davis MRN: [id number] Date of Birth: 12/19/1955  OT Individual Time: 0800-0900  OT Individual Time Calculation (min): 60 min  Short Term Goals: Week 1:  OT Short Term Goal 1 (Week 1): Pt will complete UB bathing with min assist OT Short Term Goal 2 (Week 1): Pt will complete UB dressing with min assist OT Short Term Goal 3 (Week 1): Pt will complete LB dressing with min assist OT Short Term Goal 4 (Week 1): Pt will complete toilet transfer with supervision OT Short Term Goal 5 (Week 1): Pt will utilize BUE to complete grooming tasks with supervision  Skilled Therapeutic Interventions/Progress Updates:  Patient resting in bed upon arrival.  Engaged in self care retraining to include set up for shower in sitting and standing with use of grab bar and tub bench, dressing, grooming and cleanup following shower.  Also engaged in patient independently guiding this OT to replace his wet c-collar pads to simulate how he will guide his brother to complete this task at discharge.  Patient demonstrates ability to be Mod I for all BADL & IADL tasks in his hospital room.  Therapy Documentation Precautions:  Precautions Precautions: Fall;Cervical Precaution Comments: central cord syndrome Required Braces or Orthoses: Cervical Brace Cervical Brace: Hard collar;At all times Restrictions Weight Bearing Restrictions: No Pain: 10/10 right shoulder, premedicated, rest repositioned, deep tissue massage. ADL: See FIM for current functional status  Therapy/Group: Individual Therapy  Katheryn Culliton 02/19/2014, 3:48 PM

## 2014-02-19 NOTE — Plan of Care (Signed)
Problem: RH PAIN MANAGEMENT Goal: RH STG PAIN MANAGED AT OR BELOW PT'S PAIN GOAL Less or equal to 7  Outcome: Not Progressing Pain Score Remains at 9/10

## 2014-02-20 ENCOUNTER — Inpatient Hospital Stay (HOSPITAL_COMMUNITY): Payer: No Typology Code available for payment source | Admitting: Occupational Therapy

## 2014-02-20 ENCOUNTER — Inpatient Hospital Stay (HOSPITAL_COMMUNITY): Payer: No Typology Code available for payment source | Admitting: Physical Therapy

## 2014-02-20 MED ORDER — DSS 100 MG PO CAPS: 100.0000 mg | ORAL_CAPSULE | Freq: Two times a day (BID) | ORAL | Status: DC

## 2014-02-20 MED ORDER — PANTOPRAZOLE SODIUM 40 MG PO TBEC: 40.0000 mg | DELAYED_RELEASE_TABLET | Freq: Two times a day (BID) | ORAL | Status: DC

## 2014-02-20 MED ORDER — TRAZODONE HCL 50 MG PO TABS: 50.0000 mg | ORAL_TABLET | Freq: Every day | ORAL | Status: DC

## 2014-02-20 MED ORDER — OXYCODONE HCL 20 MG PO TABS: 20.0000 mg | ORAL_TABLET | Freq: Four times a day (QID) | ORAL | Status: DC | PRN

## 2014-02-20 MED ORDER — GABAPENTIN 100 MG PO CAPS: ORAL_CAPSULE | ORAL | Status: DC

## 2014-02-20 MED ORDER — METHOCARBAMOL 750 MG PO TABS
750.0000 mg | ORAL_TABLET | Freq: Four times a day (QID) | ORAL | Status: DC
Start: 2014-02-20 — End: 2014-04-06

## 2014-02-20 MED ORDER — METHOCARBAMOL 750 MG PO TABS: 750.0000 mg | ORAL_TABLET | Freq: Four times a day (QID) | ORAL | Status: DC

## 2014-02-20 MED ORDER — NICOTINE 7 MG/24HR TD PT24: 7.0000 mg | MEDICATED_PATCH | Freq: Every day | TRANSDERMAL | Status: DC

## 2014-02-20 MED ORDER — GABAPENTIN 100 MG PO CAPS
ORAL_CAPSULE | ORAL | Status: DC
Start: 2014-02-20 — End: 2014-02-20

## 2014-02-20 NOTE — Progress Notes (Signed)
Rx written for oxycodone 20 mg # 120 pills qid prn X 2 weeks then advised to taper to one tid prn. No refills

## 2014-02-20 NOTE — Progress Notes (Signed)
Physical Therapy Discharge Summary  Patient Details  Name: Gary Davis MRN: [id number] Date of Birth: [birth year]  Today's Date: 02/20/2014 PT Individual Time: 1138-1210 PT Individual Time Calculation (min): 32 min   Patient has met 11 of 11 long term goals due to improved activity tolerance, improved balance, improved postural control, increased strength, decreased pain and improved coordination.  Patient to discharge at an ambulatory level Modified Independent.   Patient's care partner is independent to provide the necessary supervision as needed; pt is mod I for all functional mobility; brother to assist as needed for higher level activities at home assistance at discharge.  Reasons goals not met: All goals met  Recommendation:  Patient will benefit from ongoing skilled PT services in home health setting and outpatient PT to continue to advance safe functional mobility, address ongoing impairments in bilat UE and LE impaired strength, motor control, timing/sequencing, pain, impaired postural control, balance, gait, and minimize fall risk.  Equipment: [facility]  Reasons for discharge: treatment goals met and discharge from hospital  Patient/family agrees with progress made and goals achieved: Yes  PT Discharge Precautions/Restrictions  Cervical collar at all times Pain  Continues to report significant R shoulder and scapular pain; provided pt with tennis ball to perform self-trigger point treatment in standing against wall.   Sensation Sensation Light Touch Impaired Details: Impaired LLE;Impaired RLE Stereognosis: Not tested Hot/Cold: Not tested Proprioception: Impaired by gross assessment Coordination Gross Motor Movements are Fluid and Coordinated: No Motor  Motor Motor: Abnormal tone;Tetraplegia Motor - Discharge Observations: Central cord; UE > LE weakness  Mobility Bed Mobility Bed Mobility: Supine to Sit;Sit to Supine Supine to Sit: 6: Modified independent  (Device/Increase time) Sit to Supine: 6: Modified independent (Device/Increase time) Transfers Stand Pivot Transfers: 6: Modified independent (Device/Increase time) Locomotion  Ambulation Ambulation/Gait Assistance: 6: Modified independent (Device/Increase time)  Balance Standardized Balance Assessment Standardized Balance Assessment: Berg Balance Test Berg Balance Test Sit to Stand: Able to stand without using hands and stabilize independently Standing Unsupported: Able to stand safely 2 minutes Sitting with Back Unsupported but Feet Supported on Floor or Stool: Able to sit safely and securely 2 minutes Stand to Sit: Sits safely with minimal use of hands Transfers: Able to transfer safely, minor use of hands Standing Unsupported with Eyes Closed: Able to stand 10 seconds with supervision Standing Ubsupported with Feet Together: Able to place feet together independently and stand 1 minute safely From Standing, Reach Forward with Outstretched Arm: Can reach confidently >25 cm (10") From Standing Position, Pick up Object from Floor: Able to pick up shoe, needs supervision From Standing Position, Turn to Look Behind Over each Shoulder: Looks behind from both sides and weight shifts well Turn 360 Degrees: Able to turn 360 degrees safely but slowly Standing Unsupported, Alternately Place Feet on Step/Stool: Able to stand independently and safely and complete 8 steps in 20 seconds Standing Unsupported, One Foot in Front: Able to place foot tandem independently and hold 30 seconds Standing on One Leg: Able to lift leg independently and hold > 10 seconds Total Score: 52 Patient demonstrates increased fall risk as noted by score of 52/56 on Berg Balance Scale.  (<36= high risk for falls, close to 100%; 37-45 significant >80%; 46-51 moderate >50%; 52-55 lower >25%) Extremity Assessment  RLE Assessment RLE Assessment: Exceptions to [facility] RLE Strength RLE Overall Strength: Deficits RLE Overall  Strength Comments: 3/5 overall, reports knee OA pain LLE Assessment LLE Assessment: Exceptions to [facility] LLE Strength LLE Overall Strength: Deficits  LLE Overall Strength Comments: 3/5 overall  See FIM for current functional status  Gary Davis [facility] 02/20/2014, 12:41 PM

## 2014-02-20 NOTE — Progress Notes (Signed)
Patient discharged with brother with belongings. All discharge information given to patient, and questions answered. Assisted to car with nurse tech assistance.

## 2014-02-20 NOTE — Progress Notes (Signed)
Social Work  Discharge Note  The overall goal for the admission was met for:   Discharge location: Yes - home alone -   intermittent assist from family  Length of Stay: Yes - 9 days  Discharge activity level: Yes - mod independent  Home/community participation: Yes  Services provided included: MD, RD, PT, OT, RN, TR, Pharmacy and SW  Financial Services: Private Insurance: [place]  Follow-up services arranged: Outpatient: PT, OT via [facility], DME: cane via [place] and Patient/Family has no preference for HH/DME agencies  Comments (or additional information):  Patient/Family verbalized understanding of follow-up arrangements: Yes  Individual responsible for coordination of the follow-up plan: patient  Confirmed correct DME delivered: Shakeda Pearse 02/20/2014    Sheldon Amara

## 2014-02-20 NOTE — Progress Notes (Signed)
Subjective/Complaints: Happy to be going home. Right shoulder still sore.  Review of Systems -had some tingling in right hand this am  Objective: Vital Signs: Blood pressure 109/65, pulse 60, temperature 98.3 F (36.8 C), temperature source Oral, resp. rate 18, height 6\' 1"  (1.854 m), weight 82.01 kg (180 lb 12.8 oz), SpO2 99.00%. No results found. No results found for this or any previous visit (from the past 72 hour(s)).   HEENT: normal and poor dentition Cardio: RRR and no murmur Resp: CTA B/L and unlabored GI: BS positive and NT, ND Extremity:  Pulses positive and No Edema Skin:   Surgical wound with steristrips Neuro: Alert/Oriented Musc/Skel:  Other bilateral intrinsic hand deformities. Large trap trigger point resolved. Area around surgical site still swollen somewhat---some associate paraspinal spasm Gen NAD motor strength is 3 minus bilateral deltoid biceps triceps and finger flexion finger extension wrist flexion and wrist extension.  Decreased sensation in bilateral C5 C6-C7 dermatomal distribution  4 minus in the hip flexors knee extensors ankle dorsiflexors and plantar flexors bilaterally  Decreased sensation bilateral big toes   Assessment/Plan: 1. Functional deficits secondary to Central cord syndrome, s/p C3-C7 PSF which require 3+ hours per day of interdisciplinary therapy in a comprehensive inpatient rehab setting. Physiatrist is providing close team supervision and 24 hour management of active medical problems listed below. Physiatrist and rehab team continue to assess barriers to discharge/monitor patient progress toward functional and medical goals.  outpt therapy arranged. Follow up  With me in about 4-6 weeks.   FIM: FIM - Bathing Bathing Steps Patient Completed: Chest;Abdomen;Front perineal area;Buttocks;Right upper leg;Left upper leg;Right lower leg (including foot);Left lower leg (including foot);Left Arm;Right Arm Bathing: 6: More than reasonable  amount of time  FIM - Upper Body Dressing/Undressing Upper body dressing/undressing steps patient completed: Thread/unthread right sleeve of pullover shirt/dresss;Thread/unthread left sleeve of pullover shirt/dress;Put head through opening of pull over shirt/dress;Pull shirt over trunk Upper body dressing/undressing: 7: Complete Independence: No helper FIM - Lower Body Dressing/Undressing Lower body dressing/undressing steps patient completed: Thread/unthread right underwear leg;Thread/unthread left underwear leg;Thread/unthread right pants leg;Thread/unthread left pants leg;Don/Doff right sock;Don/Doff left sock;Pull underwear up/down;Pull pants up/down;Don/Doff right shoe;Don/Doff left shoe;Fasten/unfasten right shoe;Fasten/unfasten left shoe;Fasten/unfasten pants Lower body dressing/undressing: 6: More than reasonable amount of time  FIM - Toileting Toileting steps completed by patient: Adjust clothing prior to toileting;Performs perineal hygiene;Adjust clothing after toileting Toileting Assistive Devices: Grab bar or rail for support Toileting: 6: Assistive device: No helper  FIM - Diplomatic Services operational officer Devices: Therapist, music Transfers: 6-Assistive device: No helper  FIM - Banker Devices: Teacher, music: 6: Bed > Chair or W/C: No assist;6: Chair or W/C > Bed: No assist;6: Supine > Sit: No assist  FIM - Locomotion: Wheelchair Distance: 500 Locomotion: Wheelchair: 0: Activity did not occur FIM - Locomotion: Ambulation Locomotion: Ambulation Assistive Devices: Emergency planning/management officer Ambulation/Gait Assistance: 6: Modified independent (Device/Increase time);5: Supervision Locomotion: Ambulation: 5: Travels 150 ft or more with supervision/safety issues  Comprehension Comprehension Mode: Auditory Comprehension: 7-Follows complex conversation/direction: With no assist  Expression Expression Mode: Verbal Expression:  7-Expresses complex ideas: With no assist  Social Interaction Social Interaction: 7-Interacts appropriately with others - No medications needed.  Problem Solving Problem Solving: 7-Solves complex problems: Recognizes & self-corrects  Memory Memory: 7-Complete Independence: No helper  Medical Problem List and Plan:  1. Functional deficits secondary to central cord syndrome status post C3-C7 posterior decompression and fusion. Cervical collar at all times  2.  DVT Prophylaxis/Anticoagulation: SCDs. Monitor for any signs of DVT  3. Pain Management: Neurontin for neuropathic pain--  -myofascial right shoulder pain---responsive to plan below  -continue scheduled robaxin to 750mg  q6, continue aggressive heat (can alternate with ice), massage, K-tape,    -maintain Oxycodone   as needed.  i expect that he will use no more than 4 per day at home with taper over next few weeks.  4. tobacco abuse. NicoDerm patch. Provide counseling  5. Neuropsych: This patient is capable of making decisions on his own behalf.  6. Skin/Wound Care: Routine skin care. Surgical site looks great   LOS (Days) 9 A FACE TO FACE EVALUATION WAS PERFORMED  Jekhi Bolin T 02/20/2014, 8:23 AM

## 2014-02-20 NOTE — Discharge Instructions (Signed)
Inpatient Rehab Discharge Instructions  Gary RidingMichael D Davis Discharge date and time: 02/20/14   Activities/Precautions/ Functional Status: Activity: Cervical collar at all times Diet: regular diet Wound Care: keep wound clean and dry Functional status:  ___ No restrictions     ___ Walk up steps independently ___ 24/7 supervision/assistance   ___ Walk up steps with assistance _X__ Intermittent supervision/assistance  ___ Bathe/dress independently ___ Walk with walker     ___ Bathe/dress with assistance ___ Walk Independently    ___ Shower independently ___ Walk with assistance    _X__ Shower with assistance _X__ No alcohol     ___ Return to work/school ________    COMMUNITY REFERRALS UPON DISCHARGE:    Outpatient: PT     OT                  Agency: [facility] Phone: [phone]               Appointment Date/Time: 02/24/14  @ 8:00 - 9:30 am (please arrive at 7:45)  Medical Equipment/Items Ordered: Cane                                                      Agency/Supplier: [facility] [phone]        Special Instructions:    My questions have been answered and I understand these instructions. I will adhere to these goals and the provided educational materials after my discharge from the hospital.  Patient/Caregiver Signature _______________________________ Date __________  Clinician Signature _______________________________________ Date __________  Please bring this form and your medication list with you to all your follow-up doctor's appointments.

## 2014-02-23 NOTE — Discharge Summary (Signed)
[name], Davis           ACCOUNT NO.:  [id number]  MEDICAL RECORD NO.:  [id number]  LOCATION:  4W05C                        FACILITY:  [facility]  PHYSICIAN:  [name], M.D.DATE OF BIRTH:  1955-12-04  DATE OF ADMISSION:  02/11/2014 DATE OF DISCHARGE:  02/20/2014                              DISCHARGE SUMMARY   DISCHARGE DIAGNOSES: 1. Functional deficits secondary to central cord syndrome, status post     C3-C7 decompression fusion. 2. Pain management. 3. Sequential compression devices for DVT prophylaxis. 4. Tobacco abuse.  HISTORY OF PRESENT ILLNESS:  This is a 58 year old right-handed male admitted on February 04, 2014, with complaints of numbness and spasticity, weakness of both hands over the past 2 months.  He reported not being able to grasp and hold objects due to symptoms as well as couple of falls.  Cranial CT scan negative.  X-rays and imaging revealed severe central cervical stenosis C3-C7 consistent with cervical central cord syndrome.  Placed on intravenous Decadron.  Underwent posterior cervical decompression laminectomy and foraminotomies with posterior cervical lateral mass screw fixation, February 09, 2014, per Dr. Wynetta Emery.  HOSPITAL COURSE AND PAIN MANAGEMENT:  Cervical collar at all times. Physical and Occupational Therapy ongoing.  The patient was admitted for comprehensive rehab program.  PAST MEDICAL HISTORY:  See discharge diagnoses.  SOCIAL HISTORY:  The patient lives alone.  Functional status upon admission to Rehab Services was independent. Sedentary functional status upon admission to rehab services was min assist, ambulate 75 feet with a standard walker, minimal assist for transfers, min to mod assist for activities of daily living.  PHYSICAL EXAMINATION:  VITAL SIGNS:  Blood pressure 118/71, pulse 60, temperature 97.5, respirations 18. GENERAL:  This was an alert male, oriented x3. LUNGS:  Clear to auscultation. CARDIAC:  Regular rate  and rhythm. ABDOMEN:  Soft, nontender.  Good bowel sounds.  Cervical collar intact.  REHABILITATION HOSPITAL COURSE:  Patient was admitted to inpatient rehab services with therapies initiated on a 3-hour daily basis consisting of physical therapy, occupational therapy, and rehabilitation nursing.  The following issues were addressed during the patient's rehabilitation stay.  Pertaining to Mr. Gary Davis central cord syndrome, status post C3- C7 decompression and fusion, surgical site healing nicely, cervical collar in place.  He would follow up with Neurosurgery.  Sequential compression devices in place for DVT prophylaxis.  Pain management with the use of Neurontin as well as Robaxin, oxycodone for breakthrough pain.  Tobacco abuse with a NicoDerm patch.  Received counseling in regard to cessation of nicotine products.  It was questionable if he would be compliant with this request.  No bowel or bladder disturbances. The patient received weekly collaborative interdisciplinary team conferences to discuss estimated length of stay, family teaching, and any barriers to discharge.  He was discharged at ambulatory level modified independence, activities of daily living and homemaking, he was able to button his shirt.  All necessary equipment had been arranged. Patient engages in self-care, retraining with grab bars, tub and his dressing, grooming and clean up after showering.  He made excellent gains during his rehab stay.  He was discharged to home with followup therapies as advised.  DISCHARGE MEDICATIONS:  Neurontin 100 mg p.o. 4 tablets  at bedtime, Robaxin 750 mg every 6 hours, oxycodone 1 tablet by mouth every 6 hours as needed pain taper to 1 pill every 8 hours after 2 weeks, Protonix 40 mg p.o. b.i.d., Desyrel 50 mg p.o. at bedtime.  The patient would follow up with Dr. Donalee Citrin, Neurosurgery, 2 weeks call for appointment; Dr. Harold Hedge on April 06, 2014.  SPECIAL  INSTRUCTIONS:  Cervical collar at all times.     Mariam Dollar, P.A.   ______________________________ [name], M.D.    DA/MEDQ  D:  02/23/2014  T:  02/23/2014  Job:  [id number]  cc:   Donalee Citrin, M.D. [name], M.D.

## 2014-02-23 NOTE — Discharge Summary (Signed)
Discharge summary job [id number]

## 2014-02-24 ENCOUNTER — Ambulatory Visit
Payer: No Typology Code available for payment source | Attending: Physical Medicine & Rehabilitation | Admitting: Occupational Therapy

## 2014-02-24 ENCOUNTER — Ambulatory Visit: Payer: No Typology Code available for payment source | Admitting: Physical Therapy

## 2014-03-02 ENCOUNTER — Encounter: Payer: Self-pay | Admitting: Internal Medicine

## 2014-03-02 ENCOUNTER — Ambulatory Visit: Payer: No Typology Code available for payment source | Attending: Internal Medicine | Admitting: Internal Medicine

## 2014-03-02 VITALS — BP 110/74 | HR 80 | Temp 98.4°F | Resp 16 | Wt 172.4 lb

## 2014-03-02 DIAGNOSIS — M4712 Other spondylosis with myelopathy, cervical region: Secondary | ICD-10-CM | POA: Diagnosis present

## 2014-03-02 DIAGNOSIS — Z139 Encounter for screening, unspecified: Secondary | ICD-10-CM

## 2014-03-02 DIAGNOSIS — F17200 Nicotine dependence, unspecified, uncomplicated: Secondary | ICD-10-CM | POA: Diagnosis not present

## 2014-03-02 DIAGNOSIS — D649 Anemia, unspecified: Secondary | ICD-10-CM | POA: Diagnosis not present

## 2014-03-02 DIAGNOSIS — Z0000 Encounter for general adult medical examination without abnormal findings: Secondary | ICD-10-CM | POA: Insufficient documentation

## 2014-03-02 DIAGNOSIS — G4700 Insomnia, unspecified: Secondary | ICD-10-CM | POA: Insufficient documentation

## 2014-03-02 DIAGNOSIS — R7301 Impaired fasting glucose: Secondary | ICD-10-CM | POA: Insufficient documentation

## 2014-03-02 DIAGNOSIS — K219 Gastro-esophageal reflux disease without esophagitis: Secondary | ICD-10-CM | POA: Diagnosis not present

## 2014-03-02 MED ORDER — NICOTINE 7 MG/24HR TD PT24: 7.0000 mg | MEDICATED_PATCH | Freq: Every day | TRANSDERMAL | Status: DC

## 2014-03-02 NOTE — Patient Instructions (Signed)
Diabetes Mellitus and Food It is important for you to manage your blood sugar (glucose) level. Your blood glucose level can be greatly affected by what you eat. Eating healthier foods in the appropriate amounts throughout the day at about the same time each day will help you control your blood glucose level. It can also help slow or prevent worsening of your diabetes mellitus. Healthy eating may even help you improve the level of your blood pressure and reach or maintain a healthy weight.  HOW CAN FOOD AFFECT ME? Carbohydrates Carbohydrates affect your blood glucose level more than any other type of food. Your dietitian will help you determine how many carbohydrates to eat at each meal and teach you how to count carbohydrates. Counting carbohydrates is important to keep your blood glucose at a healthy level, especially if you are using insulin or taking certain medicines for diabetes mellitus. Alcohol Alcohol can cause sudden decreases in blood glucose (hypoglycemia), especially if you use insulin or take certain medicines for diabetes mellitus. Hypoglycemia can be a life-threatening condition. Symptoms of hypoglycemia (sleepiness, dizziness, and disorientation) are similar to symptoms of having too much alcohol.  If your health care provider has given you approval to drink alcohol, do so in moderation and use the following guidelines:  Women should not have more than one drink per day, and men should not have more than two drinks per day. One drink is equal to:  12 oz of beer.  5 oz of wine.  1 oz of hard liquor.  Do not drink on an empty stomach.  Keep yourself hydrated. Have water, diet soda, or unsweetened iced tea.  Regular soda, juice, and other mixers might contain a lot of carbohydrates and should be counted. WHAT FOODS ARE NOT RECOMMENDED? As you make food choices, it is important to remember that all foods are not the same. Some foods have fewer nutrients per serving than other  foods, even though they might have the same number of calories or carbohydrates. It is difficult to get your body what it needs when you eat foods with fewer nutrients. Examples of foods that you should avoid that are high in calories and carbohydrates but low in nutrients include:  Trans fats (most processed foods list trans fats on the Nutrition Facts label).  Regular soda.  Juice.  Candy.  Sweets, such as cake, pie, doughnuts, and cookies.  Fried foods. WHAT FOODS CAN I EAT? Have nutrient-rich foods, which will nourish your body and keep you healthy. The food you should eat also will depend on several factors, including:  The calories you need.  The medicines you take.  Your weight.  Your blood glucose level.  Your blood pressure level.  Your cholesterol level. You also should eat a variety of foods, including:  Protein, such as meat, poultry, fish, tofu, nuts, and seeds (lean animal proteins are best).  Fruits.  Vegetables.  Dairy products, such as milk, cheese, and yogurt (low fat is best).  Breads, grains, pasta, cereal, rice, and beans.  Fats such as olive oil, trans fat-free margarine, canola oil, avocado, and olives. DOES EVERYONE WITH DIABETES MELLITUS HAVE THE SAME MEAL PLAN? Because every person with diabetes mellitus is different, there is not one meal plan that works for everyone. It is very important that you meet with a dietitian who will help you create a meal plan that is just right for you. Document Released: 03/16/2005 Document Revised: 06/24/2013 Document Reviewed: 05/16/2013 ExitCare Patient Information 2015 [employer]. This   information is not intended to replace advice given to you by your health care provider. Make sure you discuss any questions you have with your health care provider.  

## 2014-03-02 NOTE — Progress Notes (Signed)
Patient Demographics  Gary Davis, is a 58 y.o. male  JYN:[id number]  QMV:[id number]  DOB - 03-May-1956  CC:  Chief Complaint  Patient presents with  . Hospitalization Follow-up       HPI: Gary Davis is a 58 y.o. male here today to establish medical care.Patient was recently hospitalized her with the symptoms of numbness and spasticity and weakness of both hands, EMR reviewed her patient had cervical stenosis with cervical central cord syndrome, patient was treated initially with IV and Decadron and then underwent neck surgery, patient was admitted in inpatient rehabilitation services underwent physical therapy occupational therapy patient was prescribed pain medication including Neurontin Robaxin oxycodone, currently following up with the neurosurgery and rehabilitation. Patient denies any acute symptoms, as per patient he had colonoscopy done in the past and was told it was normal. Patient has No headache, No chest pain, No abdominal pain - No Nausea, No new weakness tingling or numbness, No Cough - SOB.  No Known Allergies Past Medical History  Diagnosis Date  . Arthritis   . Cataract    Current Outpatient Prescriptions on File Prior to Visit  Medication Sig Dispense Refill  . Docusate Sodium (DSS) 100 MG CAPS Take 100 mg by mouth 2 (two) times daily.  60 each  1  . gabapentin (NEURONTIN) 100 MG capsule Take two pills with breakfast and lunch. Take four pills at bedtime.  180 capsule  1  . methocarbamol (ROBAXIN) 750 MG tablet Take 1 tablet (750 mg total) by mouth every 6 (six) hours.  120 tablet  1  . oxyCODONE 20 MG TABS Take 1 tablet (20 mg total) by mouth every 6 (six) hours as needed for moderate pain. Taper to one pill every 8 hours after 2 weeks.  120 tablet  0  . pantoprazole (PROTONIX) 40 MG tablet Take 1 tablet (40 mg total) by mouth 2 (two) times daily.  30 tablet  1  . traZODone (DESYREL) 50 MG tablet Take 1 tablet (50 mg total) by mouth at bedtime. For  sleep  30 tablet  1   No current facility-administered medications on file prior to visit.   Family History  Problem Relation Age of Onset  . Cancer Mother     breast cancer   History   Social History  . Marital Status: Single    Spouse Name: N/A    Number of Children: N/A  . Years of Education: N/A   Occupational History  . Not on file.   Social History Main Topics  . Smoking status: Current Every Day Smoker -- 0.33 packs/day for 50 years    Types: Cigarettes  . Smokeless tobacco: Not on file  . Alcohol Use: Yes  . Drug Use: No  . Sexual Activity: Not on file   Other Topics Concern  . Not on file   Social History Narrative  . No narrative on file    Review of Systems: Constitutional: Negative for fever, chills, diaphoresis, activity change, appetite change and fatigue. HENT: Negative for ear pain, nosebleeds, congestion, facial swelling, rhinorrhea, neck pain, neck stiffness and ear discharge.  Eyes: Negative for pain, discharge, redness, itching and visual disturbance. Respiratory: Negative for cough, choking, chest tightness, shortness of breath, wheezing and stridor.  Cardiovascular: Negative for chest pain, palpitations and leg swelling. Gastrointestinal: Negative for abdominal distention. Genitourinary: Negative for dysuria, urgency, frequency, hematuria, flank pain, decreased urine volume, difficulty urinating and dyspareunia.  Musculoskeletal: Negative for back pain, joint swelling, arthralgia and gait  problem. Neurological: Negative for dizziness, tremors, seizures, syncope, facial asymmetry, speech difficulty, weakness, light-headedness, numbness and headaches.  Hematological: Negative for adenopathy. Does not bruise/bleed easily. Psychiatric/Behavioral: Negative for hallucinations, behavioral problems, confusion, dysphoric mood, decreased concentration and agitation.    Objective:   Filed Vitals:   03/02/14 1438  BP: 110/74  Pulse: 80  Temp: 98.4 F  (36.9 C)  Resp: 16    Physical Exam: Constitutional: Patient appears well-developed and well-nourished. No distress. HENT: Normocephalic, atraumatic, External right and left ear normal. Oropharynx is clear and Davis.  Eyes: Conjunctivae and EOM are normal. PERRLA, no scleral icterus. Neck:  Limited ROM cervical collar in place CVS: RRR, S1/S2 +, no murmurs, no gallops, no carotid bruit.  Pulmonary: Effort and breath sounds normal, no stridor, rhonchi, wheezes, rales.  Abdominal: Soft. BS +, no distension, tenderness, rebound or guarding.  Musculoskeletal: Normal range of motion. No edema and no tenderness.  Neuro: Alert. Normal reflexes, muscle tone coordination. No cranial nerve deficit. Skin: Skin is warm and dry. No rash noted. Not diaphoretic. No erythema. No pallor. Psychiatric: Normal mood and affect. Behavior, judgment, thought content normal.  Lab Results  Component Value Date   WBC 8.2 02/12/2014   HGB 12.4* 02/12/2014   HCT 36.1* 02/12/2014   MCV 101.1* 02/12/2014   PLT 151 02/12/2014   Lab Results  Component Value Date   CREATININE 0.74 02/12/2014   BUN 15 02/12/2014   NA 137 02/12/2014   K 4.3 02/12/2014   CL 98 02/12/2014   CO2 28 02/12/2014    No results found for this basename: HGBA1C   Lipid Panel  No results found for this basename: chol, trig, hdl, cholhdl, vldl, ldlcalc       Assessment and plan:   1. Myelopathy, spondylogenic, cervical Status post surgery currently has cervical collar and following up with neuro rehabilitation.  2. Tobacco use disorder Advised patient to quit smoking, prescribed nicotine patch. - nicotine (NICODERM CQ - DOSED IN MG/24 HR) 7 mg/24hr patch; Place 1 patch (7 mg total) onto the skin daily.  Dispense: 28 patch; Refill: 0  3. Gastroesophageal reflux disease, esophagitis presence not specified Advised patient for lifestyle modification, continue with Protonix  4. Anemia, unspecified Can be secondary to recent surgery, will  repeat CBC - CBC with Differential; Future  5. Insomnia Currently patient is on trazodone.  6. IFG (impaired fasting glucose) Advised patient for low carbohydrate diet.  7. Screening We'll do fasting baseline blood work. - COMPLETE METABOLIC PANEL WITH GFR; Future - Lipid panel; Future - TSH; Future - Vit D  25 hydroxy (rtn osteoporosis monitoring); Future   Return in about 3 months (around 06/01/2014) for IFG.   [name], MD

## 2014-03-02 NOTE — Progress Notes (Signed)
Patient here for follow up from hospital Recently had spinal surgery Here to establish care

## 2014-03-06 ENCOUNTER — Ambulatory Visit
Payer: No Typology Code available for payment source | Attending: Internal Medicine | Admitting: Rehabilitative and Restorative Service Providers"

## 2014-03-06 DIAGNOSIS — IMO0001 Reserved for inherently not codable concepts without codable children: Secondary | ICD-10-CM | POA: Diagnosis not present

## 2014-03-06 DIAGNOSIS — Z9889 Other specified postprocedural states: Secondary | ICD-10-CM | POA: Diagnosis not present

## 2014-03-06 DIAGNOSIS — M6281 Muscle weakness (generalized): Secondary | ICD-10-CM | POA: Diagnosis not present

## 2014-03-06 DIAGNOSIS — R269 Unspecified abnormalities of gait and mobility: Secondary | ICD-10-CM | POA: Diagnosis not present

## 2014-03-10 ENCOUNTER — Telehealth: Payer: Self-pay | Admitting: *Deleted

## 2014-03-10 NOTE — Telephone Encounter (Signed)
Gary Davis called and left a message at 4:08 pm about needing a refill on his oxycontin. He received at discharge from [facility]. I called him back and told him that unfortunately it was too late for [place] to do anything since there was no way to get an rx to him from the office since Gary Davis gone and office closes at 4:30.  Since we are closed 03/09/14 for holiday we would not open up again before 03/10/14  am.  I told him the only alternative would be to call [facility]  On Saturday not Friday night and get Gary Davis who is on call or one of the PA's to help him.  I sent Gary Davis a message and he asked that he not call,  but that he would have  An RX available at the nurses station after 10am.  I called Gary Davis back and gave him this information.

## 2014-03-11 ENCOUNTER — Ambulatory Visit: Payer: No Typology Code available for payment source | Admitting: Occupational Therapy

## 2014-03-17 ENCOUNTER — Other Ambulatory Visit: Payer: No Typology Code available for payment source

## 2014-03-18 ENCOUNTER — Ambulatory Visit: Payer: No Typology Code available for payment source | Admitting: Occupational Therapy

## 2014-04-06 ENCOUNTER — Encounter
Payer: No Typology Code available for payment source | Attending: Physical Medicine & Rehabilitation | Admitting: Physical Medicine & Rehabilitation

## 2014-04-06 ENCOUNTER — Encounter: Payer: Self-pay | Admitting: Physical Medicine & Rehabilitation

## 2014-04-06 VITALS — BP 106/68 | HR 77 | Resp 14 | Ht 71.0 in | Wt 180.0 lb

## 2014-04-06 DIAGNOSIS — R29898 Other symptoms and signs involving the musculoskeletal system: Secondary | ICD-10-CM | POA: Diagnosis not present

## 2014-04-06 DIAGNOSIS — M4712 Other spondylosis with myelopathy, cervical region: Secondary | ICD-10-CM

## 2014-04-06 DIAGNOSIS — Z79899 Other long term (current) drug therapy: Secondary | ICD-10-CM

## 2014-04-06 DIAGNOSIS — S14129S Central cord syndrome at unspecified level of cervical spinal cord, sequela: Secondary | ICD-10-CM

## 2014-04-06 DIAGNOSIS — G9520 Unspecified cord compression: Secondary | ICD-10-CM

## 2014-04-06 DIAGNOSIS — Z5181 Encounter for therapeutic drug level monitoring: Secondary | ICD-10-CM | POA: Diagnosis not present

## 2014-04-06 MED ORDER — OXYCODONE HCL 20 MG PO TABS: 20.0000 mg | ORAL_TABLET | Freq: Four times a day (QID) | ORAL | Status: DC | PRN

## 2014-04-06 MED ORDER — TIZANIDINE HCL 2 MG PO TABS: 2.0000 mg | ORAL_TABLET | Freq: Three times a day (TID) | ORAL | Status: DC

## 2014-04-06 NOTE — Progress Notes (Signed)
Subjective:    Patient ID: Gary Davis, male    DOB: March 24, 1956, 58 y.o.   MRN: [id number]  HPI  Gary Davis is back regarding his cervical central cord syndrome. He just saw Dr. Wynetta Emery who changed him to a soft collar.   He is now walking with a straight cane. No falls. Bowels and bladder are functioning normally.   His pain has been most prominent above and between his shoulder and around his neck. The pain does not radiated to his arms. He has some tingling in his hands but the pain he complains of is not similar to that. His sleep is poor due to the pain.  We have been providing him oxycodone for pain. He received hydrocodone from Dr. Wynetta Emery about 10 days ago.   He is still working with PT and OT on UE dexterity and strength as well as gait.        Pain Inventory Average Pain 9 Pain Right Now 9 My pain is constant  In the last 24 hours, has pain interfered with the following? General activity 9 Relation with others 9 Enjoyment of life 9 What TIME of day is your pain at its worst? morning and night Sleep (in general) Poor  Pain is worse with: bending, sitting and standing Pain improves with: rest and medication Relief from Meds: 9  Mobility walk with assistance use a cane how many minutes can you walk? 5 ability to climb steps?  yes do you drive?  no  Function not employed: date last employed 10/20/13  Neuro/Psych numbness trouble walking  Prior Studies Any changes since last visit?  no  Physicians involved in your care Any changes since last visit?  no   Family History  Problem Relation Age of Onset  . Cancer Mother     breast cancer   History   Social History  . Marital Status: Single    Spouse Name: N/A    Number of Children: N/A  . Years of Education: N/A   Social History Main Topics  . Smoking status: Current Every Day Smoker -- 0.33 packs/day for 50 years    Types: Cigarettes  . Smokeless tobacco: None  . Alcohol Use: Yes  . Drug Use:  No  . Sexual Activity: None   Other Topics Concern  . None   Social History Narrative  . None   Past Surgical History  Procedure Laterality Date  . Arthroscopic repair acl Right   . Orif ankle fracture Right   . Finger fracture surgery      Right 4th Finger  . Posterior cervical fusion/foraminotomy N/A 02/09/2014    Procedure: POSTERIOR CERVICAL FUSION/FORAMINOTOMY LEVEL 4  Cervical three to seven decompression and fusion with lateral mass screws;  Surgeon: Mariam Dollar, MD;  Location: [facility];  Service: Neurosurgery;  Laterality: N/A;   Past Medical History  Diagnosis Date  . Arthritis   . Cataract    BP 106/68  Pulse 77  Resp 14  Ht 5\' 11"  (1.803 m)  Wt 180 lb (81.647 kg)  BMI 25.12 kg/m2  SpO2 98%  Opioid Risk Score:   Fall Risk Score: Moderate Fall Risk (6-13 points) (educated and given handout on fall prevention in the home)  Review of Systems  Musculoskeletal: Positive for gait problem.  Neurological: Positive for numbness.  All other systems reviewed and are negative.      Objective:   Physical Exam HEENT: normal and poor dentition  Cardio: RRR and no  murmur  Resp: CTA B/L and unlabored  GI: BS positive and NT, ND  Extremity: Pulses positive and No Edema  Skin: Surgical wound with steristrips  Neuro: Alert/Oriented  Musc/Skel: Other bilateral intrinsic hand deformities.spasm and tightness in both traps and splenius capitis muscles. Head forward position .  Gen NAD  motor strength is 4minus right deltoid biceps triceps and finger flexion finger extension wrist flexion and wrist extension. 4+ to 5/5 on left upper Decreased sensation in bilateral C5 C6-C7 dermatomal distribution on right  4 minus in the hip flexors knee extensors ankle dorsiflexors and plantar flexors, 5/5 on left lower. Gait is wide based withe some antalgia with weight bearing RLE  Decreased sensation left instep.  Assessment/Plan:  1. Functional deficits secondary to central cord  syndrome status post C3-C7 posterior decompression and fusion.   2. Pain mgt -myofascial pain---dc robaxin, begin trial of zanaflex 2mg  TID -dc gabapentin----pain is no longer neuropathic -maintain Oxycodone as needed. i expect that he will use no more than 4 per day at home with taper over next few weeks -a CSA was signed, UDS was collected   FOllow up in about 4 weeks. Continue with therapies in the meantime to improve dexterity and balance.

## 2014-04-06 NOTE — Patient Instructions (Signed)
NEED TO WORK ON YOUR POSTURE WHEN YOU SIT AND STAND. INCREASE YOUR SHOULDER RANGE OF MOTION.

## 2014-04-22 ENCOUNTER — Telehealth: Payer: Self-pay | Admitting: Physical Medicine & Rehabilitation

## 2014-04-22 NOTE — Telephone Encounter (Signed)
Pt will be treated non-narcotic from here on out.

## 2014-04-22 NOTE — Telephone Encounter (Signed)
Message copied by Ranelle OysterSWARTZ, ZACHARY T on Wed Apr 22, 2014 11:45 AM ------      Message from: Doreene ElandSHUMAKER, SYBIL W      Created: Fri Apr 10, 2014  3:30 PM       Positive for cocaine ------

## 2014-05-08 ENCOUNTER — Telehealth: Payer: Self-pay | Admitting: Physical Medicine & Rehabilitation

## 2014-05-08 ENCOUNTER — Encounter: Payer: No Typology Code available for payment source | Admitting: Physical Medicine & Rehabilitation

## 2014-05-08 NOTE — Telephone Encounter (Signed)
Patient discharged for +UDS and being unwilling to stay on as a non-narcotic patient

## 2014-05-08 NOTE — Telephone Encounter (Signed)
Patient arrived for his appointment with Dr. Riley KillSwartz. I informed the patient that he would not be receiving narcotic prescriptions anymore due to positive result for cocaine on his UDS from 04-06-14. The patient chose to leave since he would not be receiving narcotic prescriptions. A discharge letter will be mailed to his current address.

## 2014-07-01 ENCOUNTER — Encounter: Payer: No Typology Code available for payment source | Admitting: Internal Medicine

## 2014-07-16 ENCOUNTER — Emergency Department (HOSPITAL_COMMUNITY)
Admission: EM | Admit: 2014-07-16 | Discharge: 2014-07-16 | Disposition: A | Discharge status: Home / Self Care | Payer: No Typology Code available for payment source | Attending: Emergency Medicine | Admitting: Emergency Medicine

## 2014-07-16 ENCOUNTER — Telehealth: Payer: Self-pay | Admitting: *Deleted

## 2014-07-16 ENCOUNTER — Encounter (HOSPITAL_COMMUNITY): Payer: Self-pay | Admitting: Emergency Medicine

## 2014-07-16 DIAGNOSIS — Z720 Tobacco use: Secondary | ICD-10-CM | POA: Insufficient documentation

## 2014-07-16 DIAGNOSIS — Z8669 Personal history of other diseases of the nervous system and sense organs: Secondary | ICD-10-CM | POA: Insufficient documentation

## 2014-07-16 DIAGNOSIS — Z79899 Other long term (current) drug therapy: Secondary | ICD-10-CM | POA: Insufficient documentation

## 2014-07-16 DIAGNOSIS — G8929 Other chronic pain: Secondary | ICD-10-CM | POA: Diagnosis not present

## 2014-07-16 DIAGNOSIS — M25561 Pain in right knee: Secondary | ICD-10-CM | POA: Insufficient documentation

## 2014-07-16 DIAGNOSIS — M1990 Unspecified osteoarthritis, unspecified site: Secondary | ICD-10-CM | POA: Diagnosis not present

## 2014-07-16 DIAGNOSIS — M25562 Pain in left knee: Secondary | ICD-10-CM | POA: Insufficient documentation

## 2014-07-16 MED ORDER — OXYCODONE-ACETAMINOPHEN 5-325 MG PO TABS
1.0000 | ORAL_TABLET | Freq: Once | ORAL | Status: AC
  Administered 2014-07-16: 1 via ORAL
  Filled 2014-07-16: qty 1

## 2014-07-16 MED ORDER — IBUPROFEN 800 MG PO TABS: 800.0000 mg | ORAL_TABLET | Freq: Three times a day (TID) | ORAL | Status: DC | PRN

## 2014-07-16 NOTE — Discharge Instructions (Signed)
Please follow up with an orthopedic specialist for further management of your knee pain.  Take ibuprofen as needed for pain, wear knee sleeve for support.  Knee Pain Knee pain can be a result of an injury or other medical conditions. Treatment will depend on the cause of your pain. HOME CARE  Only take medicine as told by your doctor.  Keep a healthy weight. Being overweight can make the knee hurt more.  Stretch before exercising or playing sports.  If there is constant knee pain, change the way you exercise. Ask your doctor for advice.  Make sure shoes fit well. Choose the right shoe for the sport or activity.  Protect your knees. Wear kneepads if needed.  Rest when you are tired. GET HELP RIGHT AWAY IF:   Your knee pain does not stop.  Your knee pain does not get better.  Your knee joint feels hot to the touch.  You have a fever. MAKE SURE YOU:   Understand these instructions.  Will watch this condition.  Will get help right away if you are not doing well or get worse. Document Released: 09/15/2008 Document Revised: 09/11/2011 Document Reviewed: 09/15/2008 [facility] CenterExitCare Patient Information 2015 [place], [place]. This information is not intended to replace advice given to you by your health care provider. Make sure you discuss any questions you have with your health care provider.

## 2014-07-16 NOTE — ED Notes (Signed)
Pt requesting a [facility] for [facility]rest" at the [facility].

## 2014-07-16 NOTE — ED Notes (Signed)
Hx of chronic knee pain. [facility] Hx of back sx in August 2015 per Dr. Wynetta Emeryram.

## 2014-07-16 NOTE — ED Provider Notes (Signed)
CSN: [id number]     Arrival date & time 07/16/14  1012 History  This chart was scribed for non-physician practitioner, Fayrene Helper, PA-C, working with Juliet Rude. Rubin Payor, MD by Charline Bills, ED Scribe. This patient was seen in room TR05C/TR05C and the patient's care was started at 11:03 AM.   Chief Complaint  Patient presents with  . Knee Pain   The history is provided by the patient. No language interpreter was used.   HPI Comments: Gary Davis is a 59 y.o. male, with a h/o arthritis and chronic knee pain, who presents to the Emergency Department complaining of gradually worsening bilateral knee pain, R worse than L, over the past 3 weeks. He reports stumbling while ambulating that he attributes to his knee giving out. He reports walking more than usual yesterday which he suspects exacerbated pain. No known injury. Pt denies fever, hip pain. Pt has been treating with ibuprofen and ice without relief. Pt does not have an orthopedist.   Past Medical History  Diagnosis Date  . Arthritis   . Cataract    Past Surgical History  Procedure Laterality Date  . Arthroscopic repair acl Right   . Orif ankle fracture Right   . Finger fracture surgery      Right 4th Finger  . Posterior cervical fusion/foraminotomy N/A 02/09/2014    Procedure: POSTERIOR CERVICAL FUSION/FORAMINOTOMY LEVEL 4  Cervical three to seven decompression and fusion with lateral mass screws;  Surgeon: Mariam Dollar, MD;  Location: [facility];  Service: Neurosurgery;  Laterality: N/A;  . Back surgery    . Fracture surgery     Family History  Problem Relation Age of Onset  . Cancer Mother     breast cancer   History  Substance Use Topics  . Smoking status: Current Every Day Smoker -- 0.33 packs/day for 50 years    Types: Cigarettes  . Smokeless tobacco: Not on file  . Alcohol Use: Yes    Review of Systems  Constitutional: Negative for fever.  Musculoskeletal: Positive for arthralgias. Negative for joint  swelling.   Allergies  Review of patient's allergies indicates no known allergies.  Home Medications   Prior to Admission medications   Medication Sig Start Date End Date Taking? Authorizing Provider  Docusate Sodium (DSS) 100 MG CAPS Take 100 mg by mouth 2 (two) times daily. 02/20/14   Jacquelynn Cree, PA-C  nicotine (NICODERM CQ - DOSED IN MG/24 HR) 7 mg/24hr patch Place 1 patch (7 mg total) onto the skin daily. 03/02/14   [name], MD  Oxycodone HCl 20 MG TABS Take 1 tablet (20 mg total) by mouth every 6 (six) hours as needed. Taper to one pill every 8 hours after 2 weeks. 04/06/14   [name], MD  pantoprazole (PROTONIX) 40 MG tablet Take 1 tablet (40 mg total) by mouth 2 (two) times daily. 02/20/14   Jacquelynn Cree, PA-C  tiZANidine (ZANAFLEX) 2 MG tablet Take 1 tablet (2 mg total) by mouth 3 (three) times daily. 04/06/14   [name], MD  traZODone (DESYREL) 50 MG tablet Take 1 tablet (50 mg total) by mouth at bedtime. For sleep 02/20/14   Jacquelynn Cree, PA-C   Triage Vitals: BP 122/77 mmHg  Pulse 63  Temp(Src) 97.4 F (36.3 C) (Oral)  Resp 16  SpO2 99% Physical Exam  Constitutional: He is oriented to person, place, and time. He appears well-developed and well-nourished. No distress.  HENT:  Head: Normocephalic and  atraumatic.  Eyes: Conjunctivae and EOM are normal.  Neck: Neck supple.  Cardiovascular: Normal rate.   Pulmonary/Chest: Effort normal.  Musculoskeletal: Normal range of motion.  Tenderness along both medial and lateral joint lines and inferior aspect of patellar. Normal knee flexion and extension. Negative anterior and posterior drawer test. Normal varus and valgus. Leg compartment is soft. No swelling noted. No obvious deformity. Pedal pulse is palpable. No obvious joint effusion or sign of infection. Full ROM on L knee but pain with ROM. Able to bear weight with increasing pain. Good capillary refill. Full ROM of L hip and L ankle.   Neurological: He is  alert and oriented to person, place, and time.  Skin: Skin is warm and dry.  Psychiatric: He has a normal mood and affect. His behavior is normal.  Nursing note and vitals reviewed.  ED Course  Procedures (including critical care time) DIAGNOSTIC STUDIES: Oxygen Saturation is 99% on RA, normal by my interpretation.    COORDINATION OF CARE: 11:10 AM-Pt has chronic R knee pain, worsening with increase ambulation and cold weather.  Suspect arthritis.  Doubt joint infection.  No traumatic injury requiring xray.  Discussed treatment plan which includes Percocet and knee sleeve with pt at bedside and pt agreed to plan. Ortho referral given. Pt is at the shelter and requesting a note for bedrest.    Labs Review Labs Reviewed - No data to display  Imaging Review No results found.   EKG Interpretation None      MDM   Final diagnoses:  Chronic knee pain, right    BP 122/77 mmHg  Pulse 63  Temp(Src) 97.4 F (36.3 C) (Oral)  Resp 16  SpO2 99%   I personally performed the services described in this documentation, which was scribed in my presence. The recorded information has been reviewed and is accurate.    Fayrene HelperBowie Ozie Lupe, PA-C 07/16/14 1116  Nathan R. Rubin PayorPickering, MD 07/17/14 [phone]

## 2014-07-16 NOTE — Telephone Encounter (Signed)
Pt called stating that orthopedic MD he was referred to will not see him due to an outstanding bill of $900.00.  Requested new referral.  NCM explained that since that MD was "doc of day" he would have to see that physician; also suggested pt  Could wait for [facility] appt next week to see if they had another referral.

## 2014-07-27 ENCOUNTER — Ambulatory Visit: Payer: No Typology Code available for payment source | Attending: Internal Medicine | Admitting: Internal Medicine

## 2014-07-27 ENCOUNTER — Encounter: Payer: Self-pay | Admitting: Internal Medicine

## 2014-07-27 VITALS — BP 122/73 | HR 64 | Temp 98.0°F | Resp 16 | Wt 183.4 lb

## 2014-07-27 DIAGNOSIS — R7301 Impaired fasting glucose: Secondary | ICD-10-CM | POA: Insufficient documentation

## 2014-07-27 DIAGNOSIS — F17210 Nicotine dependence, cigarettes, uncomplicated: Secondary | ICD-10-CM | POA: Diagnosis not present

## 2014-07-27 DIAGNOSIS — F1490 Cocaine use, unspecified, uncomplicated: Secondary | ICD-10-CM | POA: Diagnosis not present

## 2014-07-27 DIAGNOSIS — M1990 Unspecified osteoarthritis, unspecified site: Secondary | ICD-10-CM | POA: Diagnosis not present

## 2014-07-27 DIAGNOSIS — Z9889 Other specified postprocedural states: Secondary | ICD-10-CM

## 2014-07-27 DIAGNOSIS — Z981 Arthrodesis status: Secondary | ICD-10-CM | POA: Insufficient documentation

## 2014-07-27 DIAGNOSIS — K219 Gastro-esophageal reflux disease without esophagitis: Secondary | ICD-10-CM | POA: Insufficient documentation

## 2014-07-27 DIAGNOSIS — Z79891 Long term (current) use of opiate analgesic: Secondary | ICD-10-CM | POA: Insufficient documentation

## 2014-07-27 DIAGNOSIS — Z79899 Other long term (current) drug therapy: Secondary | ICD-10-CM | POA: Diagnosis not present

## 2014-07-27 DIAGNOSIS — D649 Anemia, unspecified: Secondary | ICD-10-CM

## 2014-07-27 DIAGNOSIS — Z139 Encounter for screening, unspecified: Secondary | ICD-10-CM

## 2014-07-27 DIAGNOSIS — Z0000 Encounter for general adult medical examination without abnormal findings: Secondary | ICD-10-CM

## 2014-07-27 LAB — COMPLETE METABOLIC PANEL WITH GFR
ALBUMIN: 4.3 g/dL (ref 3.5–5.2)
AST: 13 U/L (ref 0–37)
Alkaline Phosphatase: 78 U/L (ref 39–117)
BUN: 21 mg/dL (ref 6–23)
CO2: 28 meq/L (ref 19–32)
Calcium: 9.5 mg/dL (ref 8.4–10.5)
Chloride: 105 mEq/L (ref 96–112)
Creat: 0.89 mg/dL (ref 0.50–1.35)
GFR, Est African American: >89 mL/min
GFR, Est Non African American: >89 mL/min
GLUCOSE: 100 mg/dL — AB (ref 70–99)
POTASSIUM: 4 meq/L (ref 3.5–5.3)
Sodium: 143 mEq/L (ref 135–145)
TOTAL PROTEIN: 6.9 g/dL (ref 6.0–8.3)
Total Bilirubin: 0.6 mg/dL (ref 0.2–1.2)

## 2014-07-27 LAB — CBC WITH DIFFERENTIAL/PLATELET
BASOS PCT: 0 % (ref 0–1)
Basophils Absolute: 0 10*3/uL (ref 0.0–0.1)
Eosinophils Absolute: 0.1 10*3/uL (ref 0.0–0.7)
Eosinophils Relative: 2 % (ref 0–5)
HCT: 40.6 % (ref 39.0–52.0)
Hemoglobin: 13.7 g/dL (ref 13.0–17.0)
Lymphocytes Relative: 30 % (ref 12–46)
Lymphs Abs: 1.7 10*3/uL (ref 0.7–4.0)
MCH: 32.7 pg (ref 26.0–34.0)
MCHC: 33.7 g/dL (ref 30.0–36.0)
MCV: 96.9 fL (ref 78.0–100.0)
MPV: 9.8 fL (ref 8.6–12.4)
Monocytes Absolute: 0.7 10*3/uL (ref 0.1–1.0)
Monocytes Relative: 13 % — ABNORMAL HIGH (ref 3–12)
NEUTROS ABS: 3.1 10*3/uL (ref 1.7–7.7)
Neutrophils Relative %: 55 % (ref 43–77)
PLATELETS: 210 10*3/uL (ref 150–400)
RBC: 4.19 MIL/uL — AB (ref 4.22–5.81)
RDW: 13.5 % (ref 11.5–15.5)
WBC: 5.7 10*3/uL (ref 4.0–10.5)

## 2014-07-27 LAB — TSH: TSH: 0.54 u[IU]/mL (ref 0.350–4.500)

## 2014-07-27 LAB — LIPID PANEL
CHOLESTEROL: 163 mg/dL (ref 0–200)
HDL: 65 mg/dL (ref >39)
LDL CALC: 87 mg/dL (ref 0–99)
Total CHOL/HDL Ratio: 2.5 Ratio
Triglycerides: 57 mg/dL (ref <150)
VLDL: 11 mg/dL (ref 0–40)

## 2014-07-27 LAB — POCT GLYCOSYLATED HEMOGLOBIN (HGB A1C): Hemoglobin A1C: 5.6

## 2014-07-27 NOTE — Progress Notes (Signed)
MRN: [id number] Name: Gary Davis  Sex: male Age: 59 y.o. DOB: [birth year]  Allergies: Review of patient's allergies indicates no known allergies.  Chief Complaint  Patient presents with  . Follow-up    HPI: Patient is 59 y.o. male who history of neck surgery, impaired fasting glucose comes today for followup, patient missed his last appointment and need to repeat blood work, EMR reviewed her patient was following up with physical medicine and was getting narcotic medications, apparently his urine drug screen came back positive for cocaine and has not been prescribed narcotic medications currently he is following up with orthopedics has history of arthritis as per patient he is getting injections in his knees and has been prescribed Neurontin 9 meloxicam which she has been taking. Patient still smokes cigarettes, I have advised patient to quit smoking.  Past Medical History  Diagnosis Date  . Arthritis   . Cataract     Past Surgical History  Procedure Laterality Date  . Arthroscopic repair acl Right   . Orif ankle fracture Right   . Finger fracture surgery      Right 4th Finger  . Posterior cervical fusion/foraminotomy N/A 02/09/2014    Procedure: POSTERIOR CERVICAL FUSION/FORAMINOTOMY LEVEL 4  Cervical three to seven decompression and fusion with lateral mass screws;  Surgeon: Mariam Dollar, MD;  Location: [facility];  Service: Neurosurgery;  Laterality: N/A;  . Back surgery    . Fracture surgery        Medication List       This list is accurate as of: 07/27/14 12:15 PM.  Always use your most recent med list.               DSS 100 MG Caps  Take 100 mg by mouth 2 (two) times daily.     ibuprofen 800 MG tablet  Commonly known as:  ADVIL,MOTRIN  Take 1 tablet (800 mg total) by mouth every 8 (eight) hours as needed for moderate pain.     nicotine 7 mg/24hr patch  Commonly known as:  NICODERM CQ - dosed in mg/24 hr  Place 1 patch (7 mg total) onto the skin  daily.     Oxycodone HCl 20 MG Tabs  Take 1 tablet (20 mg total) by mouth every 6 (six) hours as needed. Taper to one pill every 8 hours after 2 weeks.     pantoprazole 40 MG tablet  Commonly known as:  PROTONIX  Take 1 tablet (40 mg total) by mouth 2 (two) times daily.     tiZANidine 2 MG tablet  Commonly known as:  ZANAFLEX  Take 1 tablet (2 mg total) by mouth 3 (three) times daily.     traZODone 50 MG tablet  Commonly known as:  DESYREL  Take 1 tablet (50 mg total) by mouth at bedtime. For sleep        No orders of the defined types were placed in this encounter.    There is no immunization history for the selected administration types on file for this patient.  Family History  Problem Relation Age of Onset  . Cancer Mother     breast cancer    History  Substance Use Topics  . Smoking status: Current Every Day Smoker -- 0.33 packs/day for 50 years    Types: Cigarettes  . Smokeless tobacco: Not on file  . Alcohol Use: Yes    Review of Systems   As noted in HPI  Filed Vitals:  07/27/14 1150  BP: 122/73  Pulse: 64  Temp: 98 F (36.7 C)  Resp: 16    Physical Exam  Physical Exam  Constitutional: No distress.  Eyes: EOM are normal. Pupils are equal, round, and reactive to light.  Cardiovascular: Normal rate and regular rhythm.   Pulmonary/Chest: Breath sounds normal. No respiratory distress. He has no wheezes. He has no rales.    CBC    Component Value Date/Time   WBC 8.2 02/12/2014 0535   RBC 3.57* 02/12/2014 0535   HGB 12.4* 02/12/2014 0535   HCT 36.1* 02/12/2014 0535   PLT 151 02/12/2014 0535   MCV 101.1* 02/12/2014 0535   LYMPHSABS 1.4 02/12/2014 0535   MONOABS 1.8* 02/12/2014 0535   EOSABS 0.1 02/12/2014 0535   BASOSABS 0.0 02/12/2014 0535    CMP     Component Value Date/Time   NA 137 02/12/2014 0535   K 4.3 02/12/2014 0535   CL 98 02/12/2014 0535   CO2 28 02/12/2014 0535   GLUCOSE 125* 02/12/2014 0535   BUN 15 02/12/2014 0535    CREATININE 0.74 02/12/2014 0535   CALCIUM 8.9 02/12/2014 0535   PROT 6.2 02/12/2014 0535   ALBUMIN 2.9* 02/12/2014 0535   AST 12 02/12/2014 0535   ALT 10 02/12/2014 0535   ALKPHOS 55 02/12/2014 0535   BILITOT <0.2* 02/12/2014 0535   GFRNONAA >90 02/12/2014 0535   GFRAA >90 02/12/2014 0535    No results found for: CHOL  No components found for: HGA1C  Lab Results  Component Value Date/Time   AST 12 02/12/2014 05:35 AM    Assessment and Plan  Preventative health care - Plan:  Results for orders placed or performed in visit on 07/27/14  HgB A1c  Result Value Ref Range   Hemoglobin A1C 5.60    Patient has history of impaired fasting glucose, today his HgB A1c is 5.6%.  History of neck surgery Currently taking nonnarcotic medications for pain.  Gastroesophageal reflux disease, esophagitis presence not specified Lifestyle modification, continue with Protonix.  Arthritis Currently following up with orthopedics and is taking Neurontin and meloxicam.  Patient we'll do blood work today.   Health Maintenance  -Vaccinations:  Patient declined flu shot  Return in about 3 months (around 10/26/2014), or if symptoms worsen or fail to improve.  Doris CheadleADVANI, Hildy Nicholl, MD

## 2014-07-27 NOTE — Progress Notes (Signed)
Patient states he is here for follow up on his cervical stenosis And cervical  Central cord syndrome Patient missed his last follow up appointment and is due for blood work as well

## 2014-07-28 LAB — VITAMIN D 25 HYDROXY (VIT D DEFICIENCY, FRACTURES): VIT D 25 HYDROXY: 6 ng/mL — AB (ref 30–100)

## 2014-07-29 ENCOUNTER — Telehealth: Payer: Self-pay | Admitting: *Deleted

## 2014-07-29 ENCOUNTER — Other Ambulatory Visit: Payer: Self-pay | Admitting: *Deleted

## 2014-07-29 MED ORDER — VITAMIN D (ERGOCALCIFEROL) 1.25 MG (50000 UNIT) PO CAPS: 50000.0000 [IU] | ORAL_CAPSULE | ORAL | Status: DC

## 2014-07-29 NOTE — Telephone Encounter (Signed)
Pt aware of lab results Rx send to [facility]

## 2014-07-29 NOTE — Telephone Encounter (Signed)
-----   Message from [name], MD sent at 07/28/2014  9:27 AM EST ----- Blood work reviewed, noticed low vitamin D, call patient advise to start ergocalciferol 50,000 units once a week for the duration of  12 weeks. Also let the patient know that his anemia is improved and his hemoglobin level is in normal range.

## 2014-08-04 ENCOUNTER — Encounter (HOSPITAL_COMMUNITY): Payer: Self-pay

## 2014-08-04 ENCOUNTER — Emergency Department (HOSPITAL_COMMUNITY)
Admission: EM | Admit: 2014-08-04 | Discharge: 2014-08-04 | Disposition: A | Discharge status: Home / Self Care | Payer: No Typology Code available for payment source | Attending: Emergency Medicine | Admitting: Emergency Medicine

## 2014-08-04 DIAGNOSIS — Z79899 Other long term (current) drug therapy: Secondary | ICD-10-CM | POA: Insufficient documentation

## 2014-08-04 DIAGNOSIS — G8929 Other chronic pain: Secondary | ICD-10-CM | POA: Diagnosis not present

## 2014-08-04 DIAGNOSIS — Z720 Tobacco use: Secondary | ICD-10-CM | POA: Insufficient documentation

## 2014-08-04 DIAGNOSIS — Z8669 Personal history of other diseases of the nervous system and sense organs: Secondary | ICD-10-CM | POA: Insufficient documentation

## 2014-08-04 DIAGNOSIS — M1990 Unspecified osteoarthritis, unspecified site: Secondary | ICD-10-CM | POA: Insufficient documentation

## 2014-08-04 DIAGNOSIS — M25561 Pain in right knee: Secondary | ICD-10-CM | POA: Diagnosis present

## 2014-08-04 DIAGNOSIS — M25562 Pain in left knee: Secondary | ICD-10-CM | POA: Insufficient documentation

## 2014-08-04 DIAGNOSIS — M542 Cervicalgia: Secondary | ICD-10-CM | POA: Diagnosis not present

## 2014-08-04 MED ORDER — CYCLOBENZAPRINE HCL 10 MG PO TABS
10.0000 mg | ORAL_TABLET | Freq: Once | ORAL | Status: AC
  Administered 2014-08-04: 10 mg via ORAL
  Filled 2014-08-04: qty 1

## 2014-08-04 MED ORDER — OXYCODONE-ACETAMINOPHEN 5-325 MG PO TABS
2.0000 | ORAL_TABLET | Freq: Once | ORAL | Status: AC
  Administered 2014-08-04: 2 via ORAL
  Filled 2014-08-04: qty 2

## 2014-08-04 MED ORDER — TRAMADOL HCL 50 MG PO TABS: 50.0000 mg | ORAL_TABLET | Freq: Four times a day (QID) | ORAL | Status: DC | PRN

## 2014-08-04 MED ORDER — CYCLOBENZAPRINE HCL 10 MG PO TABS: 10.0000 mg | ORAL_TABLET | Freq: Three times a day (TID) | ORAL | Status: DC | PRN

## 2014-08-04 NOTE — ED Notes (Signed)
Pt states he has arthritis.  Pain in knees and neck x 3 weeks. No new injury.

## 2014-08-04 NOTE — Discharge Instructions (Signed)
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
## 2014-08-04 NOTE — ED Notes (Signed)
MD at bedside. 

## 2014-08-14 ENCOUNTER — Other Ambulatory Visit: Payer: Self-pay | Admitting: Neurosurgery

## 2014-08-14 DIAGNOSIS — M5023 Other cervical disc displacement, cervicothoracic region: Secondary | ICD-10-CM

## 2014-08-20 ENCOUNTER — Other Ambulatory Visit: Payer: No Typology Code available for payment source

## 2014-08-22 ENCOUNTER — Emergency Department (HOSPITAL_COMMUNITY)
Admission: EM | Admit: 2014-08-22 | Discharge: 2014-08-22 | Disposition: A | Discharge status: Home / Self Care | Payer: No Typology Code available for payment source | Attending: Emergency Medicine | Admitting: Emergency Medicine

## 2014-08-22 ENCOUNTER — Encounter (HOSPITAL_COMMUNITY): Payer: Self-pay | Admitting: Emergency Medicine

## 2014-08-22 ENCOUNTER — Emergency Department (HOSPITAL_COMMUNITY): Payer: No Typology Code available for payment source

## 2014-08-22 DIAGNOSIS — Z791 Long term (current) use of non-steroidal anti-inflammatories (NSAID): Secondary | ICD-10-CM | POA: Insufficient documentation

## 2014-08-22 DIAGNOSIS — H269 Unspecified cataract: Secondary | ICD-10-CM | POA: Insufficient documentation

## 2014-08-22 DIAGNOSIS — Z720 Tobacco use: Secondary | ICD-10-CM | POA: Diagnosis not present

## 2014-08-22 DIAGNOSIS — M1990 Unspecified osteoarthritis, unspecified site: Secondary | ICD-10-CM | POA: Diagnosis not present

## 2014-08-22 DIAGNOSIS — R1013 Epigastric pain: Secondary | ICD-10-CM | POA: Diagnosis present

## 2014-08-22 DIAGNOSIS — K2970 Gastritis, unspecified, without bleeding: Secondary | ICD-10-CM | POA: Diagnosis not present

## 2014-08-22 DIAGNOSIS — Z79899 Other long term (current) drug therapy: Secondary | ICD-10-CM | POA: Diagnosis not present

## 2014-08-22 LAB — LIPASE, BLOOD: Lipase: 64 U/L — ABNORMAL HIGH (ref 11–59)

## 2014-08-22 LAB — URINALYSIS, ROUTINE W REFLEX MICROSCOPIC
Bilirubin Urine: NEGATIVE
Glucose, UA: NEGATIVE mg/dL
Hgb urine dipstick: NEGATIVE
KETONES UR: NEGATIVE mg/dL
LEUKOCYTES UA: NEGATIVE
Nitrite: NEGATIVE
Protein, ur: NEGATIVE mg/dL
SPECIFIC GRAVITY, URINE: 1.011 (ref 1.005–1.030)
Urobilinogen, UA: 0.2 mg/dL (ref 0.0–1.0)
pH: 6.5 (ref 5.0–8.0)

## 2014-08-22 LAB — CBC WITH DIFFERENTIAL/PLATELET
BASOS PCT: 0 % (ref 0–1)
Basophils Absolute: 0 10*3/uL (ref 0.0–0.1)
EOS ABS: 0.3 10*3/uL (ref 0.0–0.7)
Eosinophils Relative: 4 % (ref 0–5)
HCT: 32.7 % — ABNORMAL LOW (ref 39.0–52.0)
HEMOGLOBIN: 11.1 g/dL — AB (ref 13.0–17.0)
LYMPHS PCT: 18 % (ref 12–46)
Lymphs Abs: 1.6 10*3/uL (ref 0.7–4.0)
MCH: 32.5 pg (ref 26.0–34.0)
MCHC: 33.9 g/dL (ref 30.0–36.0)
MCV: 95.6 fL (ref 78.0–100.0)
Monocytes Absolute: 0.7 10*3/uL (ref 0.1–1.0)
Monocytes Relative: 8 % (ref 3–12)
NEUTROS ABS: 6 10*3/uL (ref 1.7–7.7)
Neutrophils Relative %: 70 % (ref 43–77)
Platelets: 284 10*3/uL (ref 150–400)
RBC: 3.42 MIL/uL — ABNORMAL LOW (ref 4.22–5.81)
RDW: 12.1 % (ref 11.5–15.5)
WBC: 8.6 10*3/uL (ref 4.0–10.5)

## 2014-08-22 LAB — COMPREHENSIVE METABOLIC PANEL
ALT: 8 U/L (ref 0–53)
AST: 14 U/L (ref 0–37)
Albumin: 3.6 g/dL (ref 3.5–5.2)
Alkaline Phosphatase: 75 U/L (ref 39–117)
Anion gap: 8 (ref 5–15)
BUN: 15 mg/dL (ref 6–23)
CHLORIDE: 104 mmol/L (ref 96–112)
CO2: 28 mmol/L (ref 19–32)
Calcium: 9 mg/dL (ref 8.4–10.5)
Creatinine, Ser: 0.8 mg/dL (ref 0.50–1.35)
GFR calc Af Amer: >90 mL/min (ref >90)
GFR calc non Af Amer: >90 mL/min (ref >90)
GLUCOSE: 96 mg/dL (ref 70–99)
Potassium: 3.6 mmol/L (ref 3.5–5.1)
Sodium: 140 mmol/L (ref 135–145)
TOTAL PROTEIN: 7 g/dL (ref 6.0–8.3)
Total Bilirubin: 0.5 mg/dL (ref 0.3–1.2)

## 2014-08-22 LAB — I-STAT TROPONIN, ED: Troponin i, poc: 0.01 ng/mL (ref 0.00–0.08)

## 2014-08-22 MED ORDER — SODIUM CHLORIDE 0.9 % IV BOLUS (SEPSIS)
1000.0000 mL | Freq: Once | INTRAVENOUS | Status: AC
  Administered 2014-08-22: 1000 mL via INTRAVENOUS

## 2014-08-22 MED ORDER — ONDANSETRON 4 MG PO TBDP: ORAL_TABLET | ORAL | Status: DC

## 2014-08-22 MED ORDER — HYDROCODONE-ACETAMINOPHEN 5-325 MG PO TABS: 1.0000 | ORAL_TABLET | ORAL | Status: DC | PRN

## 2014-08-22 MED ORDER — IOHEXOL 300 MG/ML  SOLN
100.0000 mL | Freq: Once | INTRAMUSCULAR | Status: AC | PRN
  Administered 2014-08-22: 100 mL via INTRAVENOUS

## 2014-08-22 MED ORDER — IOHEXOL 300 MG/ML  SOLN
50.0000 mL | Freq: Once | INTRAMUSCULAR | Status: AC | PRN
  Administered 2014-08-22: 50 mL via ORAL

## 2014-08-22 MED ORDER — HYDROMORPHONE HCL 1 MG/ML IJ SOLN
1.0000 mg | Freq: Once | INTRAMUSCULAR | Status: AC
  Administered 2014-08-22: 1 mg via INTRAVENOUS
  Filled 2014-08-22: qty 1

## 2014-08-22 MED ORDER — PANTOPRAZOLE SODIUM 40 MG PO TBEC: 40.0000 mg | DELAYED_RELEASE_TABLET | Freq: Every day | ORAL | Status: DC

## 2014-08-22 MED ORDER — PANTOPRAZOLE SODIUM 40 MG PO TBEC
40.0000 mg | DELAYED_RELEASE_TABLET | Freq: Once | ORAL | Status: AC
Start: 2014-08-22 — End: 2014-08-22
  Administered 2014-08-22: 40 mg via ORAL
  Filled 2014-08-22: qty 1

## 2014-08-22 MED ORDER — ONDANSETRON HCL 4 MG/2ML IJ SOLN
4.0000 mg | Freq: Once | INTRAMUSCULAR | Status: AC
  Administered 2014-08-22: 4 mg via INTRAVENOUS
  Filled 2014-08-22: qty 2

## 2014-08-22 MED ORDER — GI COCKTAIL ~~LOC~~
30.0000 mL | Freq: Once | ORAL | Status: AC
  Administered 2014-08-22: 30 mL via ORAL
  Filled 2014-08-22: qty 30

## 2014-08-22 MED ORDER — MORPHINE SULFATE 4 MG/ML IJ SOLN
4.0000 mg | Freq: Once | INTRAMUSCULAR | Status: AC
  Administered 2014-08-22: 4 mg via INTRAVENOUS
  Filled 2014-08-22: qty 1

## 2014-08-22 NOTE — ED Notes (Signed)
Nurse currently starting IV 

## 2014-08-22 NOTE — Discharge Instructions (Signed)
Take Vicodin for severe pain only. No driving or operating heavy machinery while taking vicodin. This medication may cause drowsiness. Take protonix daily. Take zofran as directed as needed for nausea.  Gastritis, Adult Gastritis is soreness and swelling (inflammation) of the lining of the stomach. Gastritis can develop as a sudden onset (acute) or long-term (chronic) condition. If gastritis is not treated, it can lead to stomach bleeding and ulcers. CAUSES  Gastritis occurs when the stomach lining is weak or damaged. Digestive juices from the stomach then inflame the weakened stomach lining. The stomach lining may be weak or damaged due to viral or bacterial infections. One common bacterial infection is the Helicobacter pylori infection. Gastritis can also result from excessive alcohol consumption, taking certain medicines, or having too much acid in the stomach.  SYMPTOMS  In some cases, there are no symptoms. When symptoms are present, they may include:  Pain or a burning sensation in the upper abdomen.  Nausea.  Vomiting.  An uncomfortable feeling of fullness after eating. DIAGNOSIS  Your caregiver may suspect you have gastritis based on your symptoms and a physical exam. To determine the cause of your gastritis, your caregiver may perform the following:  Blood or stool tests to check for the H pylori bacterium.  Gastroscopy. A thin, flexible tube (endoscope) is passed down the esophagus and into the stomach. The endoscope has a light and camera on the end. Your caregiver uses the endoscope to view the inside of the stomach.  Taking a tissue sample (biopsy) from the stomach to examine under a microscope. TREATMENT  Depending on the cause of your gastritis, medicines may be prescribed. If you have a bacterial infection, such as an H pylori infection, antibiotics may be given. If your gastritis is caused by too much acid in the stomach, H2 blockers or antacids may be given. Your caregiver  may recommend that you stop taking aspirin, ibuprofen, or other nonsteroidal anti-inflammatory drugs (NSAIDs). HOME CARE INSTRUCTIONS  Only take over-the-counter or prescription medicines as directed by your caregiver.  If you were given antibiotic medicines, take them as directed. Finish them even if you start to feel better.  Drink enough fluids to keep your urine clear or pale yellow.  Avoid foods and drinks that make your symptoms worse, such as:  Caffeine or alcoholic drinks.  Chocolate.  Peppermint or mint flavorings.  Garlic and onions.  Spicy foods.  Citrus fruits, such as oranges, lemons, or limes.  Tomato-based foods such as sauce, chili, salsa, and pizza.  Fried and fatty foods.  Eat small, frequent meals instead of large meals. SEEK IMMEDIATE MEDICAL CARE IF:   You have black or dark red stools.  You vomit blood or material that looks like coffee grounds.  You are unable to keep fluids down.  Your abdominal pain gets worse.  You have a fever.  You do not feel better after 1 week.  You have any other questions or concerns. MAKE SURE YOU:  Understand these instructions.  Will watch your condition.  Will get help right away if you are not doing well or get worse. Document Released: 06/13/2001 Document Revised: 12/19/2011 Document Reviewed: 08/02/2011 [facility] Patient Information 2015 [place], [place]. This information is not intended to replace advice given to you by your health care provider. Make sure you discuss any questions you have with your health care provider.

## 2014-08-22 NOTE — ED Notes (Signed)
Patient given ginger ale. Patient is not nauseated or vomiting at this time.

## 2014-08-22 NOTE — ED Notes (Addendum)
Pt from home c/o upper abdominal pain x 2 weeks with nausea and diarrhea

## 2014-08-22 NOTE — ED Provider Notes (Signed)
CSN: [id number]     Arrival date & time 08/22/14  1804 History   First MD Initiated Contact with Patient 08/22/14 1905     Chief Complaint  Patient presents with  . Abdominal Pain     (Consider location/radiation/quality/duration/timing/severity/associated sxs/prior Treatment) HPI Comments: 59 year old male presenting with epigastric abdominal pain 2 weeks, worsening today. Pain worse when he lays down to sleep at night and occasionally after eating, nonradiating, constant. Admits to associated nausea and vomiting that began today. He reports multiple episodes of nonbloody, nonbilious emesis and 4-5 episodes of nonbloody diarrhea. Denies fever or chills. Denies chest pain, shortness of breath or urinary symptoms. He has not had anything to eat since yesterday. Admits to alcohol use, denies excessive alcohol use, last used about 3 weeks ago.  Patient is a 59 y.o. male presenting with abdominal pain. The history is provided by the patient.  Abdominal Pain Associated symptoms: diarrhea, nausea and vomiting     Past Medical History  Diagnosis Date  . Arthritis   . Cataract    Past Surgical History  Procedure Laterality Date  . Arthroscopic repair acl Right   . Orif ankle fracture Right   . Finger fracture surgery      Right 4th Finger  . Posterior cervical fusion/foraminotomy N/A 02/09/2014    Procedure: POSTERIOR CERVICAL FUSION/FORAMINOTOMY LEVEL 4  Cervical three to seven decompression and fusion with lateral mass screws;  Surgeon: Mariam Dollar, MD;  Location: [facility];  Service: Neurosurgery;  Laterality: N/A;  . Back surgery    . Fracture surgery     Family History  Problem Relation Age of Onset  . Cancer Mother     breast cancer   History  Substance Use Topics  . Smoking status: Current Every Day Smoker -- 0.33 packs/day for 50 years    Types: Cigarettes  . Smokeless tobacco: Not on file  . Alcohol Use: Yes    Review of Systems  Gastrointestinal: Positive for  nausea, vomiting, abdominal pain and diarrhea.  All other systems reviewed and are negative.     Allergies  Review of patient's allergies indicates no known allergies.  Home Medications   Prior to Admission medications   Medication Sig Start Date End Date Taking? Authorizing Provider  cyclobenzaprine (FLEXERIL) 10 MG tablet Take 1 tablet (10 mg total) by mouth 3 (three) times daily as needed for muscle spasms. 08/04/14  Yes [name], MD  gabapentin (NEURONTIN) 300 MG capsule Take 300 mg by mouth 3 (three) times daily.   Yes Historical Provider, MD  meloxicam (MOBIC) 15 MG tablet Take 15 mg by mouth daily.   Yes Historical Provider, MD  Oxycodone HCl 10 MG TABS Take 10 mg by mouth 4 (four) times daily as needed (pain).   Yes Historical Provider, MD  Docusate Sodium (DSS) 100 MG CAPS Take 100 mg by mouth 2 (two) times daily. Patient not taking: Reported on 08/22/2014 02/20/14   [name], PA-C  HYDROcodone-acetaminophen (NORCO/VICODIN) 5-325 MG per tablet Take 1-2 tablets by mouth every 4 (four) hours as needed. 08/22/14   Kathrynn Speed, PA-C  ibuprofen (ADVIL,MOTRIN) 800 MG tablet Take 1 tablet (800 mg total) by mouth every 8 (eight) hours as needed for moderate pain. Patient not taking: Reported on 08/22/2014 07/16/14   Fayrene Helper, PA-C  nicotine (NICODERM CQ - DOSED IN MG/24 HR) 7 mg/24hr patch Place 1 patch (7 mg total) onto the skin daily. Patient not taking: Reported on 08/22/2014 03/02/14  [name], MD  ondansetron (ZOFRAN ODT) 4 MG disintegrating tablet  ODT q4 hours prn nausea/vomit 08/22/14   Charle Mclaurin M Pocahontas Cohenour, PA-C  Oxycodone HCl 20 MG TABS Take 1 tablet (20 mg total) by mouth every 6 (six) hours as needed. Taper to one pill every 8 hours after 2 weeks. Patient not taking: Reported on 08/22/2014 04/06/14   [name], MD  pantoprazole (PROTONIX) 40 MG tablet Take 1 tablet (40 mg total) by mouth daily. 08/22/14   Kathrynn Speed, PA-C  tiZANidine (ZANAFLEX) 2 MG tablet Take 1  tablet (2 mg total) by mouth 3 (three) times daily. Patient not taking: Reported on 08/22/2014 04/06/14   [name], MD  traMADol (ULTRAM) 50 MG tablet Take 1 tablet (50 mg total) by mouth every 6 (six) hours as needed. Patient not taking: Reported on 08/22/2014 08/04/14   [name], MD  traZODone (DESYREL) 50 MG tablet Take 1 tablet (50 mg total) by mouth at bedtime. For sleep Patient not taking: Reported on 08/22/2014 02/20/14   [name], PA-C  Vitamin D, Ergocalciferol, (DRISDOL) 50000 UNITS CAPS capsule Take 1 capsule (50,000 Units total) by mouth every 7 (seven) days. 07/29/14   Deepak Advani, MD   BP 133/81 mmHg  Pulse 64  Temp(Src) 98 F (36.7 C) (Oral)  Resp 18  SpO2 97% Physical Exam  Constitutional: He is oriented to person, place, and time. He appears well-developed and well-nourished. No distress.  HENT:  Head: Normocephalic and atraumatic.  Eyes: Conjunctivae and EOM are normal. No scleral icterus.  Neck: Normal range of motion. Neck supple.  Cardiovascular: Normal rate, regular rhythm and normal heart sounds.   Pulmonary/Chest: Effort normal and breath sounds normal.  Abdominal: Soft. Bowel sounds are normal. There is no rebound.  Epigastric tenderness with guarding. No peritoneal signs.  Musculoskeletal: Normal range of motion. He exhibits no edema.  Neurological: He is alert and oriented to person, place, and time.  Skin: Skin is warm and dry.  Psychiatric: He has a normal mood and affect. His behavior is normal.  Nursing note and vitals reviewed.   ED Course  Procedures (including critical care time) Labs Review Labs Reviewed  CBC WITH DIFFERENTIAL/PLATELET - Abnormal; Notable for the following:    RBC 3.42 (*)    Hemoglobin 11.1 (*)    HCT 32.7 (*)    All other components within normal limits  LIPASE, BLOOD - Abnormal; Notable for the following:    Lipase 64 (*)    All other components within normal limits  COMPREHENSIVE METABOLIC PANEL   URINALYSIS, ROUTINE W REFLEX MICROSCOPIC  I-STAT TROPOININ, ED    Imaging Review Ct Abdomen Pelvis W Contrast  08/22/2014   CLINICAL DATA:  Nausea and diarrhea and upper abdominal pain  EXAM: CT ABDOMEN AND PELVIS WITH CONTRAST  TECHNIQUE: Multidetector CT imaging of the abdomen and pelvis was performed using the standard protocol following bolus administration of intravenous contrast.  CONTRAST:  OMNIPAQUE IOHEXOL 300 MG/ML  SOLN  COMPARISON:  05/15/2007  FINDINGS: There is marked mural thickening and edema of the gastric antrum without focal mass. This likely represent gastritis. Remainder of the bowel is normal in appearance. There are normal appearances of the liver, spleen, pancreas, adrenals and kidneys. No other inflammatory changes are evident in the abdomen or pelvis. There is no ascites. There is no adenopathy. Abdominal aorta is normal in caliber and intact. No significant abnormalities are evident in the lower chest except for mild unchanged  linear left base scarring. There is a tiny fat containing umbilical hernia. No significant musculoskeletal abnormalities are evident.  IMPRESSION: Rather prominent mural thickening and edema of the gastric antrum, likely gastritis.   Electronically Signed   By: Ellery Plunkaniel R Mitchell M.D.   On: 08/22/2014 22:11     EKG Interpretation   Date/Time:  Saturday August 22 2014 20:25:28 EST Ventricular Rate:  59 PR Interval:  162 QRS Duration: 99 QT Interval:  439 QTC Calculation: 435 R Axis:   -29 Text Interpretation:  Sinus rhythm Borderline left axis deviation  Confirmed by Ethelda ChickJACUBOWITZ  MD, SAM [phone]) on 08/22/2014 8:29:56 PM      MDM   Final diagnoses:  Gastritis  Epigastric abdominal pain   Nontoxic appearing and in no apparent distress. AF VSS.epigastric tenderness with guarding on exam. No peritoneal signs. Labs obtained in triage prior to patient being seen. Mildly elevated lipase. Given the epigastric tenderness with mildly  elevated lipase, CT scan obtained to rule out pancreatic pathology. CT scan showing evidence of gastritis. Discussed these findings with patient. He is tolerating PO. Pain is still present, however improved after receiving pain medication. Repeat abdominal exam still with epigastric tenderness, however improved from initial exam. Will discharge patient home with Protonix and Zofran, first dose given in the ED along with a GI cocktail. Follow-up with PCP. Stable for discharge. Return precautions given. Patient states understanding of treatment care plan and is agreeable.  Discussed with attending Dr. Ethelda ChickJacubowitz who also evaluated patient and agrees with plan of care.    Kathrynn SpeedRobyn M Minka Knight, PA-C 08/22/14 2245  Doug SouSam Jacubowitz, MD 08/23/14 [id number]

## 2014-08-22 NOTE — ED Provider Notes (Addendum)
Complains of epigastric pain for the prostate 2 weeks worse with lying supine improved with sitting up. Also improved with lying on his right side. He vomited one time today. He had one episode of diarrhea today. Denies chest pain denies shortness of breath. Nausea is improved since treatment in the emergency department he continues to complain of abdominal pain. On exam alert nontoxic lungs clear auscultation heart regular rate and rhythm abdomen nondistended normal active bowel sounds tender at epigastrium. No right upper quadrant tenderness to right lower quadrant tenderness  Doug SouSam Maryam Feely, MD 08/22/14 2013  Doug SouSam Yula Crotwell, MD 08/23/14 [id number]

## 2014-08-24 ENCOUNTER — Ambulatory Visit
Admission: RE | Admit: 2014-08-24 | Discharge: 2014-08-24 | Disposition: A | Discharge status: Home / Self Care | Payer: No Typology Code available for payment source | Source: Ambulatory Visit | Attending: Neurosurgery | Admitting: Neurosurgery

## 2014-08-24 DIAGNOSIS — M5023 Other cervical disc displacement, cervicothoracic region: Secondary | ICD-10-CM

## 2014-10-04 ENCOUNTER — Emergency Department (HOSPITAL_COMMUNITY): Payer: No Typology Code available for payment source

## 2014-10-04 ENCOUNTER — Encounter (HOSPITAL_COMMUNITY): Payer: Self-pay | Admitting: Nurse Practitioner

## 2014-10-04 ENCOUNTER — Emergency Department (HOSPITAL_COMMUNITY)
Admission: EM | Admit: 2014-10-04 | Discharge: 2014-10-04 | Disposition: A | Discharge status: Home / Self Care | Payer: No Typology Code available for payment source | Attending: Emergency Medicine | Admitting: Emergency Medicine

## 2014-10-04 DIAGNOSIS — Z8669 Personal history of other diseases of the nervous system and sense organs: Secondary | ICD-10-CM | POA: Insufficient documentation

## 2014-10-04 DIAGNOSIS — S3992XA Unspecified injury of lower back, initial encounter: Secondary | ICD-10-CM | POA: Insufficient documentation

## 2014-10-04 DIAGNOSIS — S0121XA Laceration without foreign body of nose, initial encounter: Secondary | ICD-10-CM | POA: Diagnosis not present

## 2014-10-04 DIAGNOSIS — W01198A Fall on same level from slipping, tripping and stumbling with subsequent striking against other object, initial encounter: Secondary | ICD-10-CM | POA: Insufficient documentation

## 2014-10-04 DIAGNOSIS — S199XXA Unspecified injury of neck, initial encounter: Secondary | ICD-10-CM | POA: Insufficient documentation

## 2014-10-04 DIAGNOSIS — S0003XA Contusion of scalp, initial encounter: Secondary | ICD-10-CM

## 2014-10-04 DIAGNOSIS — Y9289 Other specified places as the place of occurrence of the external cause: Secondary | ICD-10-CM | POA: Insufficient documentation

## 2014-10-04 DIAGNOSIS — Y9389 Activity, other specified: Secondary | ICD-10-CM | POA: Diagnosis not present

## 2014-10-04 DIAGNOSIS — M1990 Unspecified osteoarthritis, unspecified site: Secondary | ICD-10-CM | POA: Diagnosis not present

## 2014-10-04 DIAGNOSIS — Z79899 Other long term (current) drug therapy: Secondary | ICD-10-CM | POA: Insufficient documentation

## 2014-10-04 DIAGNOSIS — S0990XA Unspecified injury of head, initial encounter: Secondary | ICD-10-CM

## 2014-10-04 DIAGNOSIS — Z9889 Other specified postprocedural states: Secondary | ICD-10-CM | POA: Diagnosis not present

## 2014-10-04 DIAGNOSIS — Z720 Tobacco use: Secondary | ICD-10-CM | POA: Insufficient documentation

## 2014-10-04 DIAGNOSIS — Y998 Other external cause status: Secondary | ICD-10-CM | POA: Diagnosis not present

## 2014-10-04 MED ORDER — HYDROCODONE-ACETAMINOPHEN 5-325 MG PO TABS: 2.0000 | ORAL_TABLET | ORAL | Status: DC | PRN

## 2014-10-04 MED ORDER — HYDROCODONE-ACETAMINOPHEN 5-325 MG PO TABS
2.0000 | ORAL_TABLET | Freq: Once | ORAL | Status: AC
  Administered 2014-10-04: 2 via ORAL
  Filled 2014-10-04: qty 2
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
## 2014-10-04 NOTE — Discharge Instructions (Signed)
If you were given medicines take as directed.  If you are on coumadin or contraceptives realize their levels and effectiveness is altered by many different medicines.  If you have any reaction (rash, tongues swelling, other) to the medicines stop taking and see a physician.   Please follow up as directed and return to the ER or see a physician for new or worsening symptoms.  Thank you. Filed Vitals:   10/04/14 1706  BP: 124/81  Pulse: 63  Temp: 97.6 F (36.4 C)  TempSrc: Oral  Resp: 16  SpO2: 100%

## 2014-10-04 NOTE — ED Notes (Signed)
Bed: [id number] Expected date: 10/04/14 Expected time: 4:52 PM Means of arrival: Ambulance Comments: Fall

## 2014-10-04 NOTE — ED Notes (Signed)
Patient transported to CT 
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
## 2014-10-12 ENCOUNTER — Encounter: Payer: Self-pay | Admitting: Internal Medicine

## 2014-10-12 ENCOUNTER — Ambulatory Visit: Payer: No Typology Code available for payment source | Attending: Internal Medicine | Admitting: Internal Medicine

## 2014-10-12 VITALS — BP 131/82 | HR 70 | Temp 98.0°F | Resp 16 | Wt 176.4 lb

## 2014-10-12 DIAGNOSIS — Z9889 Other specified postprocedural states: Secondary | ICD-10-CM

## 2014-10-12 DIAGNOSIS — S0003XD Contusion of scalp, subsequent encounter: Secondary | ICD-10-CM | POA: Diagnosis present

## 2014-10-12 DIAGNOSIS — M25561 Pain in right knee: Secondary | ICD-10-CM | POA: Insufficient documentation

## 2014-10-12 MED ORDER — GABAPENTIN 300 MG PO CAPS: 300.0000 mg | ORAL_CAPSULE | Freq: Three times a day (TID) | ORAL | Status: DC

## 2014-10-12 NOTE — Progress Notes (Signed)
MRN: [id number] Name: Gary Davis  Sex: male Age: 59 y.o. DOB: 04/12/1956  Allergies: Review of patient's allergies indicates no known allergies.  Chief Complaint  Patient presents with  . Follow-up    HPI: Patient is 59 y.o. male who has history of chronic neck pain had surgery done last year, recently went to the emergency room at that time patient twisted and fell hitting his nasal bridge, EMR reviewed her patient had a small laceration done which was approximated with Dermabond applied, he also had a CT scan done of head and neck which was negative for any acute findings had scalp hematoma on the left side which is now improved, as per patient he has a scheduled follow with her neurosurgeon but he ran out of his Neurontin and needs refill. Patient is also going to see orthopedic Dr. for right knee pain and as per patient he is going to get an MRI done.  Past Medical History  Diagnosis Date  . Arthritis   . Cataract     Past Surgical History  Procedure Laterality Date  . Arthroscopic repair acl Right   . Orif ankle fracture Right   . Finger fracture surgery      Right 4th Finger  . Posterior cervical fusion/foraminotomy N/A 02/09/2014    Procedure: POSTERIOR CERVICAL FUSION/FORAMINOTOMY LEVEL 4  Cervical three to seven decompression and fusion with lateral mass screws;  Surgeon: [name], MD;  Location: [facility];  Service: Neurosurgery;  Laterality: N/A;  . Back surgery    . Fracture surgery        Medication List       This list is accurate as of: 10/12/14 12:29 PM.  Always use your most recent med list.               cyclobenzaprine 10 MG tablet  Commonly known as:  FLEXERIL  Take 1 tablet (10 mg total) by mouth 3 (three) times daily as needed for muscle spasms.     DSS 100 MG Caps  Take 100 mg by mouth 2 (two) times daily.     gabapentin 300 MG capsule  Commonly known as:  NEURONTIN  Take 1 capsule (300 mg total) by mouth 3 (three) times  daily.     HYDROcodone-acetaminophen 5-325 MG per tablet  Commonly known as:  NORCO/VICODIN  Take 1-2 tablets by mouth every 4 (four) hours as needed.     HYDROcodone-acetaminophen 5-325 MG per tablet  Commonly known as:  NORCO  Take 2 tablets by mouth every 4 (four) hours as needed.     ibuprofen 800 MG tablet  Commonly known as:  ADVIL,MOTRIN  Take 1 tablet (800 mg total) by mouth every 8 (eight) hours as needed for moderate pain.     meloxicam 15 MG tablet  Commonly known as:  MOBIC  Take 15 mg by mouth daily.     nicotine 7 mg/24hr patch  Commonly known as:  NICODERM CQ - dosed in mg/24 hr  Place 1 patch (7 mg total) onto the skin daily.     ondansetron 4 MG disintegrating tablet  Commonly known as:  ZOFRAN ODT  4mg  ODT q4 hours prn nausea/vomit     Oxycodone HCl 20 MG Tabs  Take 20 mg by mouth every 6 (six) hours as needed (severe pain).     Oxycodone HCl 20 MG Tabs  Take 1 tablet (20 mg total) by mouth every 6 (six) hours as needed. Taper to  one pill every 8 hours after 2 weeks.     pantoprazole 40 MG tablet  Commonly known as:  PROTONIX  Take 1 tablet (40 mg total) by mouth daily.     tiZANidine 2 MG tablet  Commonly known as:  ZANAFLEX  Take 1 tablet (2 mg total) by mouth 3 (three) times daily.     traMADol 50 MG tablet  Commonly known as:  ULTRAM  Take 1 tablet (50 mg total) by mouth every 6 (six) hours as needed.     traZODone 50 MG tablet  Commonly known as:  DESYREL  Take 1 tablet (50 mg total) by mouth at bedtime. For sleep     Vitamin D (Ergocalciferol) 50000 UNITS Caps capsule  Commonly known as:  DRISDOL  Take 1 capsule (50,000 Units total) by mouth every 7 (seven) days.        Meds ordered this encounter  Medications  . gabapentin (NEURONTIN) 300 MG capsule    Sig: Take 1 capsule (300 mg total) by mouth 3 (three) times daily.    Dispense:  90 capsule    Refill:  0    There is no immunization history for the selected administration types  on file for this patient.  Family History  Problem Relation Age of Onset  . Cancer Mother     breast cancer    History  Substance Use Topics  . Smoking status: Current Every Day Smoker -- 0.33 packs/day for 50 years    Types: Cigarettes  . Smokeless tobacco: Not on file  . Alcohol Use: Yes    Review of Systems   As noted in HPI  Filed Vitals:   10/12/14 1113  BP: 131/82  Pulse: 70  Temp: 98 F (36.7 C)  Resp: 16    Physical Exam  Physical Exam  HENT:  Left side of scalp no appreciable hematoma, nontender Nose minimal erythema no discharge  Cardiovascular: Normal rate and regular rhythm.   Pulmonary/Chest: Breath sounds normal. No respiratory distress. He has no wheezes. He has no rales.    CBC    Component Value Date/Time   WBC 8.6 08/22/2014 1818   RBC 3.42* 08/22/2014 1818   HGB 11.1* 08/22/2014 1818   HCT 32.7* 08/22/2014 1818   PLT 284 08/22/2014 1818   MCV 95.6 08/22/2014 1818   LYMPHSABS 1.6 08/22/2014 1818   MONOABS 0.7 08/22/2014 1818   EOSABS 0.3 08/22/2014 1818   BASOSABS 0.0 08/22/2014 1818    CMP     Component Value Date/Time   NA 140 08/22/2014 1818   K 3.6 08/22/2014 1818   CL 104 08/22/2014 1818   CO2 28 08/22/2014 1818   GLUCOSE 96 08/22/2014 1818   BUN 15 08/22/2014 1818   CREATININE 0.80 08/22/2014 1818   CREATININE 0.89 07/27/2014 1217   CALCIUM 9.0 08/22/2014 1818   PROT 7.0 08/22/2014 1818   ALBUMIN 3.6 08/22/2014 1818   AST 14 08/22/2014 1818   ALT 8 08/22/2014 1818   ALKPHOS 75 08/22/2014 1818   BILITOT 0.5 08/22/2014 1818   GFRNONAA >90 08/22/2014 1818   GFRNONAA >89 07/27/2014 1217   GFRAA >90 08/22/2014 1818   GFRAA >89 07/27/2014 1217    Lab Results  Component Value Date/Time   CHOL 163 07/27/2014 12:17 PM    Lab Results  Component Value Date/Time   HGBA1C 5.60 07/27/2014 11:53 AM    Lab Results  Component Value Date/Time   AST 14 08/22/2014 06:18 PM    Assessment  and Plan  Scalp hematoma,  subsequent encounter Improved.  History of neck surgery - Plan: Patient has scheduled follow with his neurosurgeon, he is given refill on gabapentin (NEURONTIN) 300 MG capsule  Right knee pain Patient following up with [place] [facility] and as per patient he is scheduled to get an MRI done.   Return in about 3 months (around 01/11/2015), or if symptoms worsen or fail to improve.   This note has been created with Education officer, environmental. Any transcriptional errors are unintentional.    [name], MD
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
## 2014-10-12 NOTE — Patient Instructions (Signed)

## 2014-10-15 ENCOUNTER — Telehealth: Payer: Self-pay | Admitting: Internal Medicine

## 2014-10-15 ENCOUNTER — Telehealth: Payer: Self-pay

## 2014-10-15 NOTE — Telephone Encounter (Signed)
Returned patient phone call Patient not available Left message on voice mail to return our call 

## 2014-10-15 NOTE — Telephone Encounter (Signed)
Patient called requesting to speak to nurse, pt is stating medication gabapentin (NEURONTIN) 300 MG capsule, was not received by the pharmacy , Please f/u with pt.

## 2014-10-15 NOTE — Telephone Encounter (Signed)
Pt returning nurse's call, please f/u with pt.   °

## 2014-10-18 ENCOUNTER — Emergency Department (HOSPITAL_COMMUNITY): Payer: No Typology Code available for payment source

## 2014-10-18 ENCOUNTER — Emergency Department (HOSPITAL_COMMUNITY)
Admission: EM | Admit: 2014-10-18 | Discharge: 2014-10-18 | Disposition: A | Discharge status: Home / Self Care | Payer: No Typology Code available for payment source | Attending: Emergency Medicine | Admitting: Emergency Medicine

## 2014-10-18 ENCOUNTER — Encounter (HOSPITAL_COMMUNITY): Payer: Self-pay | Admitting: *Deleted

## 2014-10-18 DIAGNOSIS — Z8669 Personal history of other diseases of the nervous system and sense organs: Secondary | ICD-10-CM | POA: Diagnosis not present

## 2014-10-18 DIAGNOSIS — Y9289 Other specified places as the place of occurrence of the external cause: Secondary | ICD-10-CM | POA: Insufficient documentation

## 2014-10-18 DIAGNOSIS — Y9389 Activity, other specified: Secondary | ICD-10-CM | POA: Insufficient documentation

## 2014-10-18 DIAGNOSIS — Z79899 Other long term (current) drug therapy: Secondary | ICD-10-CM | POA: Diagnosis not present

## 2014-10-18 DIAGNOSIS — Y998 Other external cause status: Secondary | ICD-10-CM | POA: Insufficient documentation

## 2014-10-18 DIAGNOSIS — S5012XA Contusion of left forearm, initial encounter: Secondary | ICD-10-CM | POA: Diagnosis not present

## 2014-10-18 DIAGNOSIS — S40022A Contusion of left upper arm, initial encounter: Secondary | ICD-10-CM

## 2014-10-18 DIAGNOSIS — M1990 Unspecified osteoarthritis, unspecified site: Secondary | ICD-10-CM | POA: Insufficient documentation

## 2014-10-18 DIAGNOSIS — S59912A Unspecified injury of left forearm, initial encounter: Secondary | ICD-10-CM | POA: Diagnosis present

## 2014-10-18 DIAGNOSIS — Z720 Tobacco use: Secondary | ICD-10-CM | POA: Insufficient documentation

## 2014-10-18 MED ORDER — OXYCODONE-ACETAMINOPHEN 5-325 MG PO TABS
1.0000 | ORAL_TABLET | Freq: Once | ORAL | Status: AC
  Administered 2014-10-18: 1 via ORAL
  Filled 2014-10-18: qty 1

## 2014-10-18 MED ORDER — NAPROXEN 500 MG PO TABS: 500.0000 mg | ORAL_TABLET | Freq: Two times a day (BID) | ORAL | Status: DC

## 2014-10-18 MED ORDER — NAPROXEN 250 MG PO TABS
500.0000 mg | ORAL_TABLET | Freq: Once | ORAL | Status: AC
  Administered 2014-10-18: 500 mg via ORAL
  Filled 2014-10-18: qty 2
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
## 2014-10-18 NOTE — ED Notes (Signed)
Declined W/C at D/C and was escorted to lobby by RN. 

## 2014-10-18 NOTE — Discharge Instructions (Signed)
These follow directions provided. Be sure to follow-up with your primary care doctor to ensure you're getting better and if symptoms worsen or do not improve. Please take naproxen twice a day to help with pain and inflammation. Use the ice pack on your arm to help with discomfort. Don't hesitate to return for any new, worsening, or concerning symptoms.  SEEK IMMEDIATE MEDICAL CARE IF:  You have increased bruising or swelling.  You have pain that is getting worse.  Your swelling or pain is not relieved with medicines.

## 2014-10-18 NOTE — ED Notes (Signed)
Me[employer] Bag And Drink provided.Marland Kitchen..Marland Kitchen
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
## 2014-10-23 ENCOUNTER — Telehealth: Payer: Self-pay | Admitting: General Practice

## 2014-10-23 ENCOUNTER — Telehealth: Payer: Self-pay

## 2014-10-23 ENCOUNTER — Telehealth: Payer: Self-pay | Admitting: *Deleted

## 2014-10-23 MED ORDER — PANTOPRAZOLE SODIUM 40 MG PO TBEC: 40.0000 mg | DELAYED_RELEASE_TABLET | Freq: Every day | ORAL | Status: DC

## 2014-10-23 NOTE — Telephone Encounter (Signed)
Patient called back returning nurses phone call.

## 2014-10-23 NOTE — Telephone Encounter (Signed)
Refill for protonix sent to pharmacy.

## 2014-10-23 NOTE — Telephone Encounter (Signed)
Returned patient phone call Patient not available Left message on voice mail to return our call 

## 2014-10-23 NOTE — Telephone Encounter (Signed)
Patient call stating he is experiencing abdominal pain similar to the [place] that [place]. Patient states [facility] for stomach pain, states they did a CT on his abdomen, diagnosed him with Epigastric abdominal pain and Gastritis. Patient states upon discharge they prescribed him medication to take to relieve his abdominal pain, states the medications worked but now he is out and the same symptoms are returning. Patient would like to know if PCP can prescribe this medication for him. Patient was just seen by PCP on 10/12/14.  Please contact patient to assist

## 2014-10-23 NOTE — Telephone Encounter (Signed)
Patient asking for us to refill medications he was prescribed in February by the emergency room for gastritis.  Explained patient would need to see his PCP in order to be assessed and to determine best treatment plan for him.

## 2014-10-26 ENCOUNTER — Other Ambulatory Visit: Payer: Self-pay | Admitting: Orthopaedic Surgery

## 2014-10-26 DIAGNOSIS — M25561 Pain in right knee: Secondary | ICD-10-CM

## 2014-10-30 ENCOUNTER — Other Ambulatory Visit: Payer: No Typology Code available for payment source

## 2014-10-30 ENCOUNTER — Inpatient Hospital Stay (HOSPITAL_COMMUNITY)
Admission: EM | Admit: 2014-10-30 | Discharge: 2014-11-02 | DRG: 200 | Disposition: A | Discharge status: Home / Self Care | Payer: No Typology Code available for payment source | Attending: Surgery | Admitting: Surgery

## 2014-10-30 DIAGNOSIS — Y9301 Activity, walking, marching and hiking: Secondary | ICD-10-CM

## 2014-10-30 DIAGNOSIS — S270XXA Traumatic pneumothorax, initial encounter: Principal | ICD-10-CM | POA: Diagnosis present

## 2014-10-30 DIAGNOSIS — J939 Pneumothorax, unspecified: Secondary | ICD-10-CM | POA: Diagnosis not present

## 2014-10-30 DIAGNOSIS — W1809XA Striking against other object with subsequent fall, initial encounter: Secondary | ICD-10-CM | POA: Diagnosis present

## 2014-10-30 DIAGNOSIS — Z79899 Other long term (current) drug therapy: Secondary | ICD-10-CM

## 2014-10-30 DIAGNOSIS — F17210 Nicotine dependence, cigarettes, uncomplicated: Secondary | ICD-10-CM | POA: Diagnosis present

## 2014-10-30 DIAGNOSIS — Y92830 Public park as the place of occurrence of the external cause: Secondary | ICD-10-CM

## 2014-10-30 DIAGNOSIS — S2239XA Fracture of one rib, unspecified side, initial encounter for closed fracture: Secondary | ICD-10-CM

## 2014-10-30 DIAGNOSIS — Z981 Arthrodesis status: Secondary | ICD-10-CM

## 2014-10-30 DIAGNOSIS — K219 Gastro-esophageal reflux disease without esophagitis: Secondary | ICD-10-CM | POA: Diagnosis present

## 2014-10-30 DIAGNOSIS — M13861 Other specified arthritis, right knee: Secondary | ICD-10-CM | POA: Diagnosis present

## 2014-10-30 DIAGNOSIS — Z79891 Long term (current) use of opiate analgesic: Secondary | ICD-10-CM

## 2014-10-30 DIAGNOSIS — G4700 Insomnia, unspecified: Secondary | ICD-10-CM | POA: Diagnosis present

## 2014-10-30 DIAGNOSIS — K429 Umbilical hernia without obstruction or gangrene: Secondary | ICD-10-CM | POA: Diagnosis present

## 2014-10-30 DIAGNOSIS — W19XXXA Unspecified fall, initial encounter: Secondary | ICD-10-CM | POA: Diagnosis present

## 2014-10-30 DIAGNOSIS — S2231XA Fracture of one rib, right side, initial encounter for closed fracture: Secondary | ICD-10-CM

## 2014-10-30 DIAGNOSIS — D649 Anemia, unspecified: Secondary | ICD-10-CM | POA: Diagnosis present

## 2014-10-30 DIAGNOSIS — S2241XA Multiple fractures of ribs, right side, initial encounter for closed fracture: Secondary | ICD-10-CM | POA: Diagnosis present

## 2014-10-30 DIAGNOSIS — M25511 Pain in right shoulder: Secondary | ICD-10-CM | POA: Diagnosis present

## 2014-10-30 DIAGNOSIS — S2249XA Multiple fractures of ribs, unspecified side, initial encounter for closed fracture: Secondary | ICD-10-CM

## 2014-10-30 NOTE — ED Notes (Signed)
Bed: WA10 Expected date:  Expected time:  Means of arrival:  Comments: EMS Fall 

## 2014-10-31 ENCOUNTER — Inpatient Hospital Stay (HOSPITAL_COMMUNITY): Payer: No Typology Code available for payment source

## 2014-10-31 ENCOUNTER — Encounter (HOSPITAL_COMMUNITY): Payer: Self-pay | Admitting: Emergency Medicine

## 2014-10-31 ENCOUNTER — Emergency Department (HOSPITAL_COMMUNITY): Payer: No Typology Code available for payment source

## 2014-10-31 DIAGNOSIS — S2239XA Fracture of one rib, unspecified side, initial encounter for closed fracture: Secondary | ICD-10-CM | POA: Diagnosis present

## 2014-10-31 DIAGNOSIS — F17210 Nicotine dependence, cigarettes, uncomplicated: Secondary | ICD-10-CM | POA: Diagnosis present

## 2014-10-31 DIAGNOSIS — M13861 Other specified arthritis, right knee: Secondary | ICD-10-CM | POA: Diagnosis present

## 2014-10-31 DIAGNOSIS — M25511 Pain in right shoulder: Secondary | ICD-10-CM | POA: Diagnosis present

## 2014-10-31 DIAGNOSIS — K429 Umbilical hernia without obstruction or gangrene: Secondary | ICD-10-CM | POA: Diagnosis present

## 2014-10-31 DIAGNOSIS — Y92830 Public park as the place of occurrence of the external cause: Secondary | ICD-10-CM | POA: Diagnosis not present

## 2014-10-31 DIAGNOSIS — G4700 Insomnia, unspecified: Secondary | ICD-10-CM | POA: Diagnosis present

## 2014-10-31 DIAGNOSIS — S270XXA Traumatic pneumothorax, initial encounter: Secondary | ICD-10-CM | POA: Diagnosis present

## 2014-10-31 DIAGNOSIS — S2241XA Multiple fractures of ribs, right side, initial encounter for closed fracture: Secondary | ICD-10-CM | POA: Diagnosis present

## 2014-10-31 DIAGNOSIS — Y9301 Activity, walking, marching and hiking: Secondary | ICD-10-CM | POA: Diagnosis not present

## 2014-10-31 DIAGNOSIS — J939 Pneumothorax, unspecified: Secondary | ICD-10-CM | POA: Diagnosis present

## 2014-10-31 DIAGNOSIS — W1809XA Striking against other object with subsequent fall, initial encounter: Secondary | ICD-10-CM | POA: Diagnosis present

## 2014-10-31 DIAGNOSIS — S2249XA Multiple fractures of ribs, unspecified side, initial encounter for closed fracture: Secondary | ICD-10-CM | POA: Diagnosis present

## 2014-10-31 DIAGNOSIS — Z79891 Long term (current) use of opiate analgesic: Secondary | ICD-10-CM | POA: Diagnosis not present

## 2014-10-31 DIAGNOSIS — D649 Anemia, unspecified: Secondary | ICD-10-CM | POA: Diagnosis present

## 2014-10-31 DIAGNOSIS — K219 Gastro-esophageal reflux disease without esophagitis: Secondary | ICD-10-CM | POA: Diagnosis present

## 2014-10-31 DIAGNOSIS — Z981 Arthrodesis status: Secondary | ICD-10-CM | POA: Diagnosis not present

## 2014-10-31 DIAGNOSIS — Z79899 Other long term (current) drug therapy: Secondary | ICD-10-CM | POA: Diagnosis not present

## 2014-10-31 LAB — PROTIME-INR
INR: 1.01 (ref 0.00–1.49)
PROTHROMBIN TIME: 13.4 s (ref 11.6–15.2)

## 2014-10-31 LAB — CBC WITH DIFFERENTIAL/PLATELET
BASOS PCT: 0 % (ref 0–1)
Basophils Absolute: 0 10*3/uL (ref 0.0–0.1)
Eosinophils Absolute: 0.1 10*3/uL (ref 0.0–0.7)
Eosinophils Relative: 1 % (ref 0–5)
HCT: 38.2 % — ABNORMAL LOW (ref 39.0–52.0)
Hemoglobin: 12.8 g/dL — ABNORMAL LOW (ref 13.0–17.0)
Lymphocytes Relative: 16 % (ref 12–46)
Lymphs Abs: 1.4 10*3/uL (ref 0.7–4.0)
MCH: 32.5 pg (ref 26.0–34.0)
MCHC: 33.5 g/dL (ref 30.0–36.0)
MCV: 97 fL (ref 78.0–100.0)
MONOS PCT: 10 % (ref 3–12)
Monocytes Absolute: 0.8 10*3/uL (ref 0.1–1.0)
NEUTROS ABS: 6.3 10*3/uL (ref 1.7–7.7)
NEUTROS PCT: 73 % (ref 43–77)
PLATELETS: 218 10*3/uL (ref 150–400)
RBC: 3.94 MIL/uL — ABNORMAL LOW (ref 4.22–5.81)
RDW: 13.1 % (ref 11.5–15.5)
WBC: 8.6 10*3/uL (ref 4.0–10.5)

## 2014-10-31 LAB — BASIC METABOLIC PANEL
Anion gap: 11 (ref 5–15)
BUN: 20 mg/dL (ref 6–23)
CALCIUM: 9.2 mg/dL (ref 8.4–10.5)
CO2: 26 mmol/L (ref 19–32)
Chloride: 104 mmol/L (ref 96–112)
Creatinine, Ser: 0.85 mg/dL (ref 0.50–1.35)
GFR calc Af Amer: >90 mL/min (ref >90)
GFR calc non Af Amer: >90 mL/min (ref >90)
Glucose, Bld: 106 mg/dL — ABNORMAL HIGH (ref 70–99)
POTASSIUM: 5.1 mmol/L (ref 3.5–5.1)
Sodium: 141 mmol/L (ref 135–145)

## 2014-10-31 MED ORDER — ONDANSETRON HCL 4 MG PO TABS: 4.0000 mg | ORAL_TABLET | Freq: Four times a day (QID) | ORAL | Status: DC | PRN

## 2014-10-31 MED ORDER — MORPHINE SULFATE 2 MG/ML IJ SOLN
1.0000 mg | INTRAMUSCULAR | Status: DC | PRN
  Administered 2014-10-31: 4 mg via INTRAVENOUS
  Filled 2014-10-31 (×2): qty 2

## 2014-10-31 MED ORDER — DOCUSATE SODIUM 100 MG PO CAPS
100.0000 mg | ORAL_CAPSULE | Freq: Two times a day (BID) | ORAL | Status: DC
  Administered 2014-10-31 – 2014-11-02 (×5): 100 mg via ORAL
  Filled 2014-10-31 (×5): qty 1

## 2014-10-31 MED ORDER — SODIUM CHLORIDE 0.9 % IV SOLN
Freq: Once | INTRAVENOUS | Status: AC
  Administered 2014-10-31: 04:00:00 via INTRAVENOUS

## 2014-10-31 MED ORDER — OXYCODONE HCL 5 MG PO TABS
10.0000 mg | ORAL_TABLET | ORAL | Status: DC | PRN
  Administered 2014-10-31 – 2014-11-02 (×12): 10 mg via ORAL
  Filled 2014-10-31 (×12): qty 2

## 2014-10-31 MED ORDER — OXYCODONE HCL 5 MG PO TABS
5.0000 mg | ORAL_TABLET | ORAL | Status: DC | PRN
  Administered 2014-10-31: 5 mg via ORAL
  Filled 2014-10-31: qty 1

## 2014-10-31 MED ORDER — HYDROCODONE-ACETAMINOPHEN 5-325 MG PO TABS
1.0000 | ORAL_TABLET | Freq: Once | ORAL | Status: AC
  Administered 2014-10-31: 1 via ORAL
  Filled 2014-10-31: qty 1

## 2014-10-31 MED ORDER — ENOXAPARIN SODIUM 40 MG/0.4ML ~~LOC~~ SOLN
40.0000 mg | SUBCUTANEOUS | Status: DC
  Administered 2014-10-31 – 2014-11-02 (×3): 40 mg via SUBCUTANEOUS
  Filled 2014-10-31 (×4): qty 0.4

## 2014-10-31 MED ORDER — CYCLOBENZAPRINE HCL 10 MG PO TABS
10.0000 mg | ORAL_TABLET | Freq: Three times a day (TID) | ORAL | Status: DC | PRN
  Administered 2014-10-31 – 2014-11-02 (×6): 10 mg via ORAL
  Filled 2014-10-31 (×6): qty 1

## 2014-10-31 MED ORDER — PANTOPRAZOLE SODIUM 40 MG PO TBEC
40.0000 mg | DELAYED_RELEASE_TABLET | Freq: Every day | ORAL | Status: DC
  Administered 2014-10-31 – 2014-11-02 (×3): 40 mg via ORAL
  Filled 2014-10-31 (×3): qty 1

## 2014-10-31 MED ORDER — MORPHINE SULFATE 2 MG/ML IJ SOLN
1.0000 mg | INTRAMUSCULAR | Status: DC | PRN
  Administered 2014-10-31 – 2014-11-02 (×13): 1 mg via INTRAVENOUS
  Filled 2014-10-31 (×13): qty 1

## 2014-10-31 MED ORDER — KCL IN DEXTROSE-NACL 20-5-0.45 MEQ/L-%-% IV SOLN
INTRAVENOUS | Status: DC
  Administered 2014-10-31 – 2014-11-01 (×2): via INTRAVENOUS
  Filled 2014-10-31 (×5): qty 1000

## 2014-10-31 MED ORDER — NAPROXEN 250 MG PO TABS
500.0000 mg | ORAL_TABLET | Freq: Two times a day (BID) | ORAL | Status: DC
Start: 2014-10-31 — End: 2014-11-02
  Administered 2014-10-31 – 2014-11-02 (×5): 500 mg via ORAL
  Filled 2014-10-31 (×5): qty 2

## 2014-10-31 MED ORDER — ONDANSETRON HCL 4 MG/2ML IJ SOLN: 4.0000 mg | Freq: Four times a day (QID) | INTRAMUSCULAR | Status: DC | PRN

## 2014-10-31 MED ORDER — GABAPENTIN 300 MG PO CAPS
300.0000 mg | ORAL_CAPSULE | Freq: Three times a day (TID) | ORAL | Status: DC
  Administered 2014-10-31 – 2014-11-02 (×8): 300 mg via ORAL
  Filled 2014-10-31 (×8): qty 1

## 2014-10-31 NOTE — Progress Notes (Signed)
Patient ID: Gary Davis, male   DOB: 10/25/1955, 59 y.o.   MRN: [id number] [facility] Surgery Progress Note:   * No surgery found *  Subjective: Mental status is clear.  Tired since he has been up most of the night being transferred from [facility] to [facility] Objective: Vital signs in last 24 hours: Temp:  [97.7 F (36.5 C)-97.8 F (36.6 C)] 97.8 F (36.6 C) (04/30 0648) Pulse Rate:  [55-68] 55 (04/30 0648) Resp:  [18-22] 20 (04/30 0648) BP: (111-119)/(78-81) 119/78 mmHg (04/30 0648) SpO2:  [100 %] 100 % (04/30 0648) Weight:  [76.839 kg (169 lb 6.4 oz)] 76.839 kg (169 lb 6.4 oz) (04/30 0700)  Intake/Output from previous day:   Intake/Output this shift:    Physical Exam: Work of breathing is not labored but he is splinting.  BS reduced on the right  Lab Results:  Results for orders placed or performed during the hospital encounter of 10/30/14 (from the past 48 hour(s))  CBC with Differential     Status: Abnormal   Collection Time: 10/31/14  4:26 AM  Result Value Ref Range   WBC 8.6 4.0 - 10.5 K/uL   RBC 3.94 (L) 4.22 - 5.81 MIL/uL   Hemoglobin 12.8 (L) 13.0 - 17.0 g/dL   HCT 38.2 (L) 39.0 - 52.0 %   MCV 97.0 78.0 - 100.0 fL   MCH 32.5 26.0 - 34.0 pg   MCHC 33.5 30.0 - 36.0 g/dL   RDW 13.1 11.5 - 15.5 %   Platelets 218 150 - 400 K/uL   Neutrophils Relative % 73 43 - 77 %   Neutro Abs 6.3 1.7 - 7.7 K/uL   Lymphocytes Relative 16 12 - 46 %   Lymphs Abs 1.4 0.7 - 4.0 K/uL   Monocytes Relative 10 3 - 12 %   Monocytes Absolute 0.8 0.1 - 1.0 K/uL   Eosinophils Relative 1 0 - 5 %   Eosinophils Absolute 0.1 0.0 - 0.7 K/uL   Basophils Relative 0 0 - 1 %   Basophils Absolute 0.0 0.0 - 0.1 K/uL  Protime-INR     Status: None   Collection Time: 10/31/14  4:26 AM  Result Value Ref Range   Prothrombin Time 13.4 11.6 - 15.2 seconds   INR 1.01 0.00 - 1.94  Basic metabolic panel     Status: Abnormal   Collection Time: 10/31/14  4:26 AM  Result Value Ref Range   Sodium 141 135 - 145  mmol/L   Potassium 5.1 3.5 - 5.1 mmol/L   Chloride 104 96 - 112 mmol/L   CO2 26 19 - 32 mmol/L   Glucose, Bld 106 (H) 70 - 99 mg/dL   BUN 20 6 - 23 mg/dL   Creatinine, Ser 0.85 0.50 - 1.35 mg/dL   Calcium 9.2 8.4 - 10.5 mg/dL   GFR calc non Af Amer >90 >90 mL/min   GFR calc Af Amer >90 >90 mL/min    Comment: (NOTE) The eGFR has been calculated using the CKD EPI equation. This calculation has not been validated in all clinical situations. eGFR's persistently <90 mL/min signify possible Chronic Kidney Disease.    Anion gap 11 5 - 15    Radiology/Results: Dg Ribs Unilateral W/chest Right  10/31/2014   CLINICAL DATA:  Status post fall, hitting a bench. Right-sided rib pain. Initial encounter.  EXAM: RIGHT RIBS AND CHEST - 3+ VIEW  COMPARISON:  Chest radiograph performed 12/26/2011  FINDINGS: There are displaced fractures of the right lateral  eighth to tenth ribs, with scattered overlying soft tissue air. A trace right apical pneumothorax is noted.  No additional fractures are seen. The lungs are otherwise grossly clear. No pleural effusion is identified.  The cardiomediastinal silhouette is borderline normal in size. Cervical spinal fusion hardware is partially imaged.  IMPRESSION: Displaced fractures of the right lateral eighth to tenth ribs, with scattered overlying soft tissue air. Trace right apical pneumothorax noted.  These results were called by telephone at the time of interpretation on 10/31/2014 at 2:48 am to [facility] PA, who verbally acknowledged these results.   Electronically Signed   By: Garald Balding M.D.   On: 10/31/2014 02:49    Anti-infectives: Anti-infectives    None      Assessment/Plan: Problem List: Patient Active Problem List   Diagnosis Date Noted  . Rib fractures 10/31/2014  . Esophageal reflux 03/02/2014  . Anemia, unspecified 03/02/2014  . Insomnia 03/02/2014  . IFG (impaired fasting glucose) 03/02/2014  . Central cord syndrome 02/11/2014  .  Myelopathy, spondylogenic, cervical 02/09/2014  . Bilateral arm weakness 02/04/2014  . Cord compression syndrome 02/04/2014  . Tobacco use disorder 02/04/2014  . Arthritis 02/04/2014    CXR is pending this morning.  No acute distress with breathing.   * No surgery found *    LOS: 0 days   Matt B. Hassell Done, MD, [facility] [phone] beeper [phone]  10/31/2014 9:12 AM

## 2014-10-31 NOTE — ED Notes (Addendum)
Attempted Lab Draw 2x. Unsuccessful. RN is aware

## 2014-10-31 NOTE — ED Provider Notes (Signed)
CSN: [id number]     Arrival date & time 10/30/14  2354 History   First MD Initiated Contact with Patient 10/31/14 0105     Chief Complaint  Patient presents with  . Fall     (Consider location/radiation/quality/duration/timing/severity/associated sxs/prior Treatment) HPI Comments: Patient states he was walking in the park when his knee gave out and he fell against a metal bench liking the right side of his body proximally 6 PM last night.  Since that time he's had right rib pain, worse with movement or respirations.  He has not taken any medication to alleviate his symptoms Patient states he has severe arthritis and walks with a cane  Patient is a 59 y.o. male presenting with fall. The history is provided by the patient.  Fall This is a new problem. The current episode started today. The problem occurs constantly. The problem has been unchanged. Associated symptoms include abdominal pain and chest pain. Pertinent negatives include no coughing, fever, joint swelling, nausea or weakness. The symptoms are aggravated by exertion. He has tried nothing for the symptoms. The treatment provided no relief.    Past Medical History  Diagnosis Date  . Arthritis   . Cataract    Past Surgical History  Procedure Laterality Date  . Arthroscopic repair acl Right   . Orif ankle fracture Right   . Finger fracture surgery      Right 4th Finger  . Posterior cervical fusion/foraminotomy N/A 02/09/2014    Procedure: POSTERIOR CERVICAL FUSION/FORAMINOTOMY LEVEL 4  Cervical three to seven decompression and fusion with lateral mass screws;  Surgeon: Mariam Dollar, MD;  Location: [facility];  Service: Neurosurgery;  Laterality: N/A;  . Back surgery    . Fracture surgery     Family History  Problem Relation Age of Onset  . Cancer Mother     breast cancer   History  Substance Use Topics  . Smoking status: Current Every Day Smoker -- 0.33 packs/day for 50 years    Types: Cigarettes  . Smokeless tobacco:  Not on file  . Alcohol Use: Yes    Review of Systems  Constitutional: Negative for fever.  Respiratory: Negative for cough.   Cardiovascular: Positive for chest pain.  Gastrointestinal: Positive for abdominal pain. Negative for nausea.  Musculoskeletal: Negative for joint swelling.  Neurological: Negative for weakness.  All other systems reviewed and are negative.     Allergies  Review of patient's allergies indicates no known allergies.  Home Medications   Prior to Admission medications   Medication Sig Start Date End Date Taking? Authorizing Provider  gabapentin (NEURONTIN) 300 MG capsule Take 1 capsule (300 mg total) by mouth 3 (three) times daily. 10/12/14  Yes [name], MD  HYDROcodone-acetaminophen (NORCO) 5-325 MG per tablet Take 2 tablets by mouth every 4 (four) hours as needed. 10/04/14  Yes Blane Ohara, MD  meloxicam (MOBIC) 15 MG tablet Take 15 mg by mouth daily.   Yes Historical Provider, MD  naproxen (NAPROSYN) 500 MG tablet Take 1 tablet (500 mg total) by mouth 2 (two) times daily. 10/18/14  Yes Harle Battiest, NP  Oxycodone HCl 20 MG TABS Take 20 mg by mouth every 6 (six) hours as needed (severe pain).   Yes Historical Provider, MD  pantoprazole (PROTONIX) 40 MG tablet Take 1 tablet (40 mg total) by mouth daily. 10/23/14  Yes [name], MD  cyclobenzaprine (FLEXERIL) 10 MG tablet Take 1 tablet (10 mg total) by mouth 3 (three) times daily as needed  for muscle spasms. Patient not taking: Reported on 10/04/2014 08/04/14   Elwin MochaBlair Walden, MD  Docusate Sodium (DSS) 100 MG CAPS Take 100 mg by mouth 2 (two) times daily. Patient not taking: Reported on 08/22/2014 02/20/14   [name], PA-C  HYDROcodone-acetaminophen (NORCO/VICODIN) 5-325 MG per tablet Take 1-2 tablets by mouth every 4 (four) hours as needed. Patient not taking: Reported on 10/04/2014 08/22/14   [name], PA-C  ibuprofen (ADVIL,MOTRIN) 800 MG tablet Take 1 tablet (800 mg total) by mouth every 8 (eight)  hours as needed for moderate pain. Patient not taking: Reported on 08/22/2014 07/16/14   [name], PA-C  nicotine (NICODERM CQ - DOSED IN MG/24 HR) 7 mg/24hr patch Place 1 patch (7 mg total) onto the skin daily. Patient not taking: Reported on 08/22/2014 03/02/14   [name], MD  ondansetron (ZOFRAN ODT) 4 MG disintegrating tablet 4mg  ODT q4 hours prn nausea/vomit 08/22/14   [name], PA-C  Oxycodone HCl 20 MG TABS Take 1 tablet (20 mg total) by mouth every 6 (six) hours as needed. Taper to one pill every 8 hours after 2 weeks. Patient not taking: Reported on 08/22/2014 04/06/14   Ranelle OysterZachary T Swartz, MD  tiZANidine (ZANAFLEX) 2 MG tablet Take 1 tablet (2 mg total) by mouth 3 (three) times daily. Patient not taking: Reported on 08/22/2014 04/06/14   Ranelle OysterZachary T Swartz, MD  traMADol (ULTRAM) 50 MG tablet Take 1 tablet (50 mg total) by mouth every 6 (six) hours as needed. Patient not taking: Reported on 08/22/2014 08/04/14   Elwin MochaBlair Walden, MD  traZODone (DESYREL) 50 MG tablet Take 1 tablet (50 mg total) by mouth at bedtime. For sleep Patient not taking: Reported on 08/22/2014 02/20/14   [name], PA-C  Vitamin D, Ergocalciferol, (DRISDOL) 50000 UNITS CAPS capsule Take 1 capsule (50,000 Units total) by mouth every 7 (seven) days. 07/29/14   [name], MD   BP 116/78 mmHg  Pulse 68  Temp(Src) 97.7 F (36.5 C) (Oral)  Resp 22  SpO2 100% Physical Exam  Constitutional: He appears well-developed and well-nourished.  HENT:  Head: Normocephalic.  Eyes: Pupils are equal, round, and reactive to light.  Cardiovascular: Normal rate and regular rhythm.   Pulmonary/Chest: Effort normal and breath sounds normal. He exhibits tenderness.  Abdominal: He exhibits no distension. There is tenderness in the right upper quadrant and right lower quadrant. There is guarding. There is no rigidity.  Musculoskeletal: Normal range of motion.  Neurological: He is alert.  Skin: Skin is warm. No rash noted. No erythema.   Nursing note and vitals reviewed.   ED Course  Procedures (including critical care time) Labs Review Labs Reviewed  CBC WITH DIFFERENTIAL/PLATELET - Abnormal; Notable for the following:    RBC 3.94 (*)    Hemoglobin 12.8 (*)    HCT 38.2 (*)    All other components within normal limits  PROTIME-INR  BASIC METABOLIC PANEL    Imaging Review Dg Ribs Unilateral W/chest Right  10/31/2014   CLINICAL DATA:  Status post fall, hitting a bench. Right-sided rib pain. Initial encounter.  EXAM: RIGHT RIBS AND CHEST - 3+ VIEW  COMPARISON:  Chest radiograph performed 12/26/2011  FINDINGS: There are displaced fractures of the right lateral eighth to tenth ribs, with scattered overlying soft tissue air. A trace right apical pneumothorax is noted.  No additional fractures are seen. The lungs are otherwise grossly clear. No pleural effusion is identified.  The cardiomediastinal silhouette is borderline normal in  size. Cervical spinal fusion hardware is partially imaged.  IMPRESSION: Displaced fractures of the right lateral eighth to tenth ribs, with scattered overlying soft tissue air. Trace right apical pneumothorax noted.  These results were called by telephone at the time of interpretation on 10/31/2014 at 2:48 am to [facility] PA, who verbally acknowledged these results.   Electronically Signed   By: Roanna Raider M.D.   On: 10/31/2014 02:49     EKG Interpretation None     [facility] contacted about admission due to multiple rib fractures and small pneumothorax. [facility]  MDM   Final diagnoses:  Rib fractures, right, closed, initial encounter  Pneumothorax on right         Earley Favor, NP 10/31/14 [id number]  Tomasita Crumble, MD 10/31/14 [phone]

## 2014-10-31 NOTE — H&P (Signed)
Re:   Gary Davis DOB:   07-16-1955 MRN:   [id number]   Trauma admission  ASSESSMENT AND PLAN: 1.  Rib fractures - right 8-10, mildly displaced  Plan admission for pain control and to follow his right pneumothorax  2.  Pneumothorax  Minimal 3.  Arthritis/old ACL injury to right knee  He has had a couple of falls due to this knee  Seen at [facility] - Dr. Donnelly Stager  He was supposed to get a knee MRI yesterday, but went to wrong facility. 4.  History of C3-C7 fusion  Wynetta Emery - Aug 2015  Saw Dr. Wynetta Emery within the last two weeks.  He was started on steroid dose pack, not entirely sure why 5.  Smokes 1/3 ppd 6.  Umbilical hernia  Chief Complaint  Patient presents with  . Fall   REFERRING PHYSICIAN:  Earley Favor, NP, Janyce Llanos  HISTORY OF PRESENT ILLNESS: Gary Davis is a 59 y.o. (DOB: 26-Sep-1955)  AA  male whose primary care physician is [name], MD ([facility]) and comes to [facility] ER today for chest pain after a fall.  The patient was walking in the park Friday, 4/29, and fell around 6 PM on a park bench.  [place] but because of severe chest pain, came to the [facility] by ambulance.  He has trouble taking a deep breath.  No prior history of rib fx.  His main pain is right chest.  He has no new neck or knee pain.  He had a fall where he suffered facial abrasions on 10/04/2014.  He had head and neck CT's at that time.  It looks like he is taking 3 different NSAID's for his knee pain.  He is on no narcotics as of yesterday, but by his records he had been on them in the past.   Past Medical History  Diagnosis Date  . Arthritis   . Cataract       Past Surgical History  Procedure Laterality Date  . Arthroscopic repair acl Right   . Orif ankle fracture Right   . Finger fracture surgery      Right 4th Finger  . Posterior cervical fusion/foraminotomy N/A 02/09/2014    Procedure: POSTERIOR CERVICAL FUSION/FORAMINOTOMY  LEVEL 4  Cervical three to seven decompression and fusion with lateral mass screws;  Surgeon: Mariam Dollar, MD;  Location: [facility];  Service: Neurosurgery;  Laterality: N/A;  . Back surgery    . Fracture surgery        Current Facility-Administered Medications  Medication Dose Route Frequency Provider Last Rate Last Dose  . 0.9 %  sodium chloride infusion   Intravenous Once Earley Favor, NP       Current Outpatient Prescriptions  Medication Sig Dispense Refill  . gabapentin (NEURONTIN) 300 MG capsule Take 1 capsule (300 mg total) by mouth 3 (three) times daily. 90 capsule 0  . HYDROcodone-acetaminophen (NORCO) 5-325 MG per tablet Take 2 tablets by mouth every 4 (four) hours as needed. 6 tablet 0  . meloxicam (MOBIC) 15 MG tablet Take 15 mg by mouth daily.    . naproxen (NAPROSYN) 500 MG tablet Take 1 tablet (500 mg total) by mouth 2 (two) times daily. 30 tablet 0  . Oxycodone HCl 20 MG TABS Take 20 mg by mouth every 6 (six) hours as needed (severe pain).    . pantoprazole (PROTONIX) 40 MG tablet Take 1 tablet (40 mg  total) by mouth daily. 30 tablet 3  . cyclobenzaprine (FLEXERIL) 10 MG tablet Take 1 tablet (10 mg total) by mouth 3 (three) times daily as needed for muscle spasms. (Patient not taking: Reported on 10/04/2014) 15 tablet 0  . Docusate Sodium (DSS) 100 MG CAPS Take 100 mg by mouth 2 (two) times daily. (Patient not taking: Reported on 08/22/2014) 60 each 1  . HYDROcodone-acetaminophen (NORCO/VICODIN) 5-325 MG per tablet Take 1-2 tablets by mouth every 4 (four) hours as needed. (Patient not taking: Reported on 10/04/2014) 10 tablet 0  . ibuprofen (ADVIL,MOTRIN) 800 MG tablet Take 1 tablet (800 mg total) by mouth every 8 (eight) hours as needed for moderate pain. (Patient not taking: Reported on 08/22/2014) 21 tablet 0  . nicotine (NICODERM CQ - DOSED IN MG/24 HR) 7 mg/24hr patch Place 1 patch (7 mg total) onto the skin daily. (Patient not taking: Reported on 08/22/2014) 28 patch 0  .  ondansetron (ZOFRAN ODT) 4 MG disintegrating tablet 4mg  ODT q4 hours prn nausea/vomit 10 tablet 0  . Oxycodone HCl 20 MG TABS Take 1 tablet (20 mg total) by mouth every 6 (six) hours as needed. Taper to one pill every 8 hours after 2 weeks. (Patient not taking: Reported on 08/22/2014) 90 tablet 0  . tiZANidine (ZANAFLEX) 2 MG tablet Take 1 tablet (2 mg total) by mouth 3 (three) times daily. (Patient not taking: Reported on 08/22/2014) 90 tablet 3  . traMADol (ULTRAM) 50 MG tablet Take 1 tablet (50 mg total) by mouth every 6 (six) hours as needed. (Patient not taking: Reported on 08/22/2014) 15 tablet 0  . traZODone (DESYREL) 50 MG tablet Take 1 tablet (50 mg total) by mouth at bedtime. For sleep (Patient not taking: Reported on 08/22/2014) 30 tablet 1  . Vitamin D, Ergocalciferol, (DRISDOL) 50000 UNITS CAPS capsule Take 1 capsule (50,000 Units total) by mouth every 7 (seven) days. 12 capsule 0     No Known Allergies  REVIEW OF SYSTEMS: Skin:  No history of rash.  No history of abnormal moles. Infection:  No history of hepatitis or HIV.  No history of MRSA. Neurologic:  No history of stroke.  No history of seizure.  No history of headaches. Cardiac:  No history of hypertension. No history of heart disease.  No history of prior cardiac catheterization.  No history of seeing a cardiologist. Pulmonary:  Smoke 1/3 ppd.   No asthma or bronchitis.  No OSA/CPAP.  Endocrine:  No diabetes. No thyroid disease. Gastrointestinal:  No history of stomach disease.  No history of liver disease.  No history of gall bladder disease.  No history of pancreas disease.  No history of colon disease. Urologic:  No history of kidney stones.  No history of bladder infections. Musculoskeletal:  History of C3-C7 fusion Wynetta Emery- [facility] - Aug 2015.    Arthritis/old ACL injury to right knee.  Seen at [facility] - Dr. Donnelly StagerM. Xu Hematologic:  No bleeding disorder.  No history of anemia.  Not anticoagulated. Psycho-social:  The patient is  oriented.   The patient has no obvious psychologic or social impairment to understanding our conversation and plan.  SOCIAL and FAMILY HISTORY: Single.  Divorced. No children. 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  He is now seeking disability because of his knees.  PHYSICAL EXAM: BP 111/81 mmHg  Pulse 64  Temp(Src) 97.7 F (36.5 C) (Oral)  Resp 18  SpO2 100%  General: AA M who is  alert.  HEENT: Normal. Pupils equal.  Poor dentition. Neck: Supple. No mass.  No thyroid mass. Lymph Nodes:  No supraclavicular or cervical nodes.  Lungs: Clear to auscultation and symmetric breath sounds.  Poor inspiratory effort with pain over right lower chest. Heart:  RRR. No murmur or rub. Abdomen: Soft. No mass. No tenderness. Normal bowel sounds.  No abdominal scars. Small umbilical hernia. Rectal: Not done. Extremities:  Pain in right knee, but he says that this is chronic. Neurologic:  Grossly intact to motor and sensory function. Psychiatric: Has normal mood and affect. Behavior is normal.   DATA REVIEWED: Epic notes.  Ovidio Kin, MD,  [facility] [address]  [address]   [place], [place]    [address] Phone:  [phone] FAX:  [phone]

## 2014-10-31 NOTE — ED Notes (Signed)
Pt arrived to the ED with complaint of right sided flank pain.  Pt states he has arthritis of the knees and it has been giving out on him on several occasions.  Pt states his knee gave way and he hit his right side on a metal bench.  Pt states pain is mid flank radiating to the back.

## 2014-11-01 ENCOUNTER — Inpatient Hospital Stay (HOSPITAL_COMMUNITY): Payer: No Typology Code available for payment source

## 2014-11-01 LAB — CBC
HEMATOCRIT: 39.8 % (ref 39.0–52.0)
HEMOGLOBIN: 13.2 g/dL (ref 13.0–17.0)
MCH: 32.1 pg (ref 26.0–34.0)
MCHC: 33.2 g/dL (ref 30.0–36.0)
MCV: 96.8 fL (ref 78.0–100.0)
Platelets: 197 10*3/uL (ref 150–400)
RBC: 4.11 MIL/uL — ABNORMAL LOW (ref 4.22–5.81)
RDW: 13 % (ref 11.5–15.5)
WBC: 5 10*3/uL (ref 4.0–10.5)

## 2014-11-01 NOTE — Progress Notes (Signed)
Patient ID: Gary Davis, male   DOB: [birth year], 59 y.o.   MRN: [id number] [facility] Progress Note:   * No surgery found *  Subjective: Mental status is clear.  Complaining of pain in right shoulder as well as ribs.   Objective: Vital signs in last 24 hours: Temp:  [97.5 F (36.4 C)-98 F (36.7 C)] 98 F (36.7 C) (05/01 0535) Pulse Rate:  [67-72] 72 (05/01 0535) Resp:  [17-18] 17 (05/01 0535) BP: (95-125)/(55-79) 95/55 mmHg (05/01 0535) SpO2:  [97 %-100 %] 99 % (05/01 0535)  Intake/Output from previous day: 04/30 0701 - 05/01 0700 In: 420 [P.O.:420] Out: 1100 [Urine:1100] Intake/Output this shift:    Physical Exam: Work of breathing is not labored.  BS equal centrally.    Lab Results:  Results for orders placed or performed during the hospital encounter of 10/30/14 (from the past 48 hour(s))  CBC with Differential     Status: Abnormal   Collection Time: 10/31/14  4:26 AM  Result Value Ref Range   WBC 8.6 4.0 - 10.5 K/uL   RBC 3.94 (L) 4.22 - 5.81 MIL/uL   Hemoglobin 12.8 (L) 13.0 - 17.0 g/dL   HCT 38.2 (L) 39.0 - 52.0 %   MCV 97.0 78.0 - 100.0 fL   MCH 32.5 26.0 - 34.0 pg   MCHC 33.5 30.0 - 36.0 g/dL   RDW 13.1 11.5 - 15.5 %   Platelets 218 150 - 400 K/uL   Neutrophils Relative % 73 43 - 77 %   Neutro Abs 6.3 1.7 - 7.7 K/uL   Lymphocytes Relative 16 12 - 46 %   Lymphs Abs 1.4 0.7 - 4.0 K/uL   Monocytes Relative 10 3 - 12 %   Monocytes Absolute 0.8 0.1 - 1.0 K/uL   Eosinophils Relative 1 0 - 5 %   Eosinophils Absolute 0.1 0.0 - 0.7 K/uL   Basophils Relative 0 0 - 1 %   Basophils Absolute 0.0 0.0 - 0.1 K/uL  Protime-INR     Status: None   Collection Time: 10/31/14  4:26 AM  Result Value Ref Range   Prothrombin Time 13.4 11.6 - 15.2 seconds   INR 1.01 0.00 - 0.35  Basic metabolic panel     Status: Abnormal   Collection Time: 10/31/14  4:26 AM  Result Value Ref Range   Sodium 141 135 - 145 mmol/L   Potassium 5.1 3.5 - 5.1 mmol/L   Chloride 104  96 - 112 mmol/L   CO2 26 19 - 32 mmol/L   Glucose, Bld 106 (H) 70 - 99 mg/dL   BUN 20 6 - 23 mg/dL   Creatinine, Ser 0.85 0.50 - 1.35 mg/dL   Calcium 9.2 8.4 - 10.5 mg/dL   GFR calc non Af Amer >90 >90 mL/min   GFR calc Af Amer >90 >90 mL/min    Comment: (NOTE) The eGFR has been calculated using the CKD EPI equation. This calculation has not been validated in all clinical situations. eGFR's persistently <90 mL/min signify possible Chronic Kidney Disease.    Anion gap 11 5 - 15    Radiology/Results: Dg Chest 2 View  10/31/2014   CLINICAL DATA:  Evaluate right-sided rib fractures  EXAM: CHEST  2 VIEW  COMPARISON:  10/31/2014  FINDINGS: Grossly unchanged cardiac silhouette and mediastinal contours. Grossly unchanged mild to moderately displaced fractures involving the lateral aspects of the right eighth and ninth ribs with associated tiny right apical pneumothorax. Slight worsening of right lateral  basilar heterogeneous opacities favored to represent atelectasis and/or contusion. No pleural effusion. No evidence of edema. Post lower cervical paraspinal fusion, incompletely evaluated.  IMPRESSION: 1. Unchanged minimally displaced fractures involving the right 8th and 9th ribs with associated tiny right apical pneumothorax. 2. Slight worsening of right basilar opacities favored to represent either atelectasis and/or developing contusion. No pleural effusion.   Electronically Signed   By: Sandi Mariscal M.D.   On: 10/31/2014 12:07   Dg Ribs Unilateral W/chest Right  10/31/2014   CLINICAL DATA:  Status post fall, hitting a bench. Right-sided rib pain. Initial encounter.  EXAM: RIGHT RIBS AND CHEST - 3+ VIEW  COMPARISON:  Chest radiograph performed 12/26/2011  FINDINGS: There are displaced fractures of the right lateral eighth to tenth ribs, with scattered overlying soft tissue air. A trace right apical pneumothorax is noted.  No additional fractures are seen. The lungs are otherwise grossly clear. No  pleural effusion is identified.  The cardiomediastinal silhouette is borderline normal in size. Cervical spinal fusion hardware is partially imaged.  IMPRESSION: Displaced fractures of the right lateral eighth to tenth ribs, with scattered overlying soft tissue air. Trace right apical pneumothorax noted.  These results were called by telephone at the time of interpretation on 10/31/2014 at 2:48 am to [facility], who verbally acknowledged these results.   Electronically Signed   By: Garald Balding M.D.   On: 10/31/2014 02:49    Anti-infectives: Anti-infectives    None      Assessment/Plan: Problem List: Patient Active Problem List   Diagnosis Date Noted  . Rib fractures 10/31/2014  . Esophageal reflux 03/02/2014  . Anemia, unspecified 03/02/2014  . Insomnia 03/02/2014  . IFG (impaired fasting glucose) 03/02/2014  . Central cord syndrome 02/11/2014  . Myelopathy, spondylogenic, cervical 02/09/2014  . Bilateral arm weakness 02/04/2014  . Cord compression syndrome 02/04/2014  . Tobacco use disorder 02/04/2014  . Arthritis 02/04/2014    CXR and CBC pending today.  Mainly needs pain control and pulmonary toilet.   * No surgery found *    LOS: 1 day   Matt B. Hassell Done, MD, [facility] [phone] beeper [phone]  11/01/2014 9:39 AM

## 2014-11-02 DIAGNOSIS — W19XXXA Unspecified fall, initial encounter: Secondary | ICD-10-CM | POA: Diagnosis present

## 2014-11-02 MED ORDER — OXYCODONE HCL 5 MG PO TABS
10.0000 mg | ORAL_TABLET | ORAL | Status: DC | PRN
  Administered 2014-11-02 (×3): 20 mg via ORAL
  Filled 2014-11-02 (×3): qty 4

## 2014-11-02 MED ORDER — TRAMADOL HCL 50 MG PO TABS
100.0000 mg | ORAL_TABLET | Freq: Four times a day (QID) | ORAL | Status: DC
  Filled 2014-11-02: qty 2

## 2014-11-02 MED ORDER — OXYCODONE-ACETAMINOPHEN 10-325 MG PO TABS: 1.0000 | ORAL_TABLET | ORAL | Status: DC | PRN

## 2014-11-02 MED ORDER — OXYCODONE HCL 5 MG PO TABS
10.0000 mg | ORAL_TABLET | Freq: Once | ORAL | Status: AC
Start: 2014-11-02 — End: 2014-11-02
  Administered 2014-11-02: 10 mg via ORAL
  Filled 2014-11-02: qty 2

## 2014-11-02 MED ORDER — WHITE PETROLATUM GEL
Status: AC
  Administered 2014-11-02: 0.2
  Filled 2014-11-02: qty 1

## 2014-11-02 MED ORDER — CYCLOBENZAPRINE HCL 10 MG PO TABS: 10.0000 mg | ORAL_TABLET | Freq: Three times a day (TID) | ORAL | Status: DC | PRN

## 2014-11-02 NOTE — Evaluation (Signed)
Physical Therapy Evaluation Patient Details Name: Gary Davis MRN: [id number] DOB: 15-May-1956 Today's Date: 11/02/2014   History of Present Illness  patient is a 59 yo male s/p mechanical fall with Rib fractures - right 8-10, mildly displaced  Clinical Impression  Patient demonstrates deficits in functional mobility as indicated below. Will need continued skilled PT to address deficits and maximize function. Will see as indicated and progress as tolerated.  Focused session on ambulation and simulated activity for return to community shelter. Patient able to simulate bus transfer, performed stair negotiation and ambulation with use of cane. Instability noted but patient reports this is near baseline. Increased time focused on bed mobility to ensure patient is able to perform as needed. Increased effort and time required but patient able to perform technique without physical assist despite increased pain.  Patient does not wish to use RW and believes that he will be able to manage upon discharge. Declines recommendations.     Follow Up Recommendations No PT follow up    Equipment Recommendations  None recommended by PT (recommend use of RW patient declines)    Recommendations for Other Services       Precautions / Restrictions Precautions Precautions: Fall Restrictions Weight Bearing Restrictions: No      Mobility  Bed Mobility Overal bed mobility: Needs Assistance Bed Mobility: Rolling;Supine to Sit;Sit to Supine Rolling: Supervision   Supine to sit: Supervision Sit to supine: Supervision   General bed mobility comments: VCs for technique and positioning. Instructed to use pillow for splinting. patient able to perform x3 without physical assist despite extreme pain (increased time and effort to perform, training performed)  Transfers Overall transfer level: Needs assistance Equipment used: Straight cane Transfers: Sit to/from Stand Sit to Stand: Supervision          General transfer comment: Supervision for stability, no physical assist (increased time)  Ambulation/Gait Ambulation/Gait assistance: Min guard;Supervision Ambulation Distance (Feet): 210 Feet Assistive device: Straight cane Gait Pattern/deviations: Step-through pattern;Decreased stride length;Decreased dorsiflexion - right;Trunk flexed Gait velocity: decreased Gait velocity interpretation: Below normal speed for age/gender General Gait Details: instability noted with gait, RLE foot drop with poor clearance. Patient declines use of RW  Stairs Stairs: Yes Stairs assistance: Min guard Stair Management: One rail Left;Step to pattern;With cane Number of Stairs: 2 General stair comments: no physical assist required  Wheelchair Mobility    Modified Rankin (Stroke Patients Only)       Balance Overall balance assessment: History of Falls                                           Pertinent Vitals/Pain Pain Assessment: 0-10 Pain Score: 9  Pain Location: right ribs Pain Descriptors / Indicators: Aching;Guarding;Grimacing;Sharp Pain Intervention(s): Monitored during session;Repositioned;Relaxation    Home Living Family/patient expects to be discharged to:: Shelter/Homeless Living Arrangements:  (4 roommates to a room)     Home Access: Level entry       Home Equipment: Cane - single point      Prior Function Level of Independence: Independent;Independent with assistive device(s)         Comments: uses cane in community, relies on bus service for transportation     Hand Dominance   Dominant Hand: Right    Extremity/Trunk Assessment               Lower Extremity Assessment: Generalized weakness;RLE deficits/detail  Cervical / Trunk Assessment:  (hx of cervial surgery)  Communication   Communication: No difficulties  Cognition Arousal/Alertness: Awake/alert Behavior During Therapy: WFL for tasks assessed/performed Overall  Cognitive Status: Within Functional Limits for tasks assessed                      General Comments      Exercises        Assessment/Plan    PT Assessment Patient needs continued PT services  PT Diagnosis Difficulty walking;Acute pain   PT Problem List Decreased strength;Decreased range of motion;Decreased activity tolerance;Decreased balance;Decreased mobility;Pain  PT Treatment Interventions DME instruction;Gait training;Stair training;Functional mobility training;Therapeutic activities;Therapeutic exercise;Balance training;Patient/family education   PT Goals (Current goals can be found in the Care Plan section) Acute Rehab PT Goals Patient Stated Goal: to go home PT Goal Formulation: With patient Time For Goal Achievement: 11/16/14 Potential to Achieve Goals: Fair    Frequency Min 3X/week   Barriers to discharge Inaccessible home environment;Decreased caregiver support lives at [facility]    Co-evaluation               End of Session Equipment Utilized During Treatment: Gait belt Activity Tolerance: Patient limited by pain Patient left: in bed;with call bell/phone within reach;with bed alarm set Nurse Communication: Mobility status         Time: 1605-1630 PT Time Calculation (min) (ACUTE ONLY): 25 min   Charges:   PT Evaluation $Initial PT Evaluation Tier I: 1 Procedure PT Treatments $Gait Training: 8-22 mins   PT G CodesFabio Asa:        Kenidi Elenbaas J 11/02/2014, 5:14 PM Charlotte Crumbevon Parisha Beaulac, PT DPT  [phone]

## 2014-11-02 NOTE — Progress Notes (Signed)
UR completed.  Mitsuye Schrodt, RN BSN [facility] CCM Trauma/Neuro ICU Case Manager [phone]  

## 2014-11-02 NOTE — Progress Notes (Signed)
Patient discharged to home.  CNA brought patient downstairs via wheel chair to meet his brother who will be picking him.

## 2014-11-02 NOTE — Progress Notes (Signed)
Discharge instructions reviewed with the patient. Rx's given and reviewed. Pt receptive. Pt stated that his brother is going to be picking him up in a few minutes. Pt ready for discharge.

## 2014-11-02 NOTE — Progress Notes (Signed)
Patient ID: Gary RidingMichael D Davis, male   DOB: 08/23/1955, 59 y.o.   MRN: [id number]   LOS: 2 days   Subjective: C/o pain, NSC   Objective: Vital signs in last 24 hours: Temp:  [97.8 F (36.6 C)-98.5 F (36.9 C)] 98 F (36.7 C) (05/02 0454) Pulse Rate:  [63-79] 67 (05/02 0454) Resp:  [18-19] 18 (05/02 0454) BP: (100-123)/(63-73) 123/73 mmHg (05/02 0454) SpO2:  [100 %] 100 % (05/02 0454) Last BM Date: 10/30/14   IS: 1000ml   Physical Exam General appearance: alert and no distress Resp: clear to auscultation bilaterally Cardio: regular rate and rhythm GI: normal findings: bowel sounds normal and soft, non-tender   Assessment/Plan: Fall Multiple right rib fxs -- Pulmonary toilet Multiple medical problems -- Home meds FEN -- Increase OxyIR, add tramadol VTE -- SCD's, Lovenox Dispo -- Home later today likely    Freeman CaldronMichael J. Avik Leoni, PA-C Pager: [phone] General Trauma PA Pager: [phone]  11/02/2014

## 2014-11-04 ENCOUNTER — Emergency Department (HOSPITAL_COMMUNITY): Payer: No Typology Code available for payment source

## 2014-11-04 ENCOUNTER — Encounter (HOSPITAL_COMMUNITY): Payer: Self-pay

## 2014-11-04 ENCOUNTER — Emergency Department (HOSPITAL_COMMUNITY)
Admission: EM | Admit: 2014-11-04 | Discharge: 2014-11-05 | Disposition: A | Discharge status: Home / Self Care | Payer: No Typology Code available for payment source | Attending: Emergency Medicine | Admitting: Emergency Medicine

## 2014-11-04 DIAGNOSIS — Z720 Tobacco use: Secondary | ICD-10-CM | POA: Diagnosis not present

## 2014-11-04 DIAGNOSIS — F10129 Alcohol abuse with intoxication, unspecified: Secondary | ICD-10-CM | POA: Insufficient documentation

## 2014-11-04 DIAGNOSIS — Y9301 Activity, walking, marching and hiking: Secondary | ICD-10-CM | POA: Insufficient documentation

## 2014-11-04 DIAGNOSIS — W19XXXA Unspecified fall, initial encounter: Secondary | ICD-10-CM

## 2014-11-04 DIAGNOSIS — Y998 Other external cause status: Secondary | ICD-10-CM | POA: Diagnosis not present

## 2014-11-04 DIAGNOSIS — Z791 Long term (current) use of non-steroidal anti-inflammatories (NSAID): Secondary | ICD-10-CM | POA: Diagnosis not present

## 2014-11-04 DIAGNOSIS — Y9289 Other specified places as the place of occurrence of the external cause: Secondary | ICD-10-CM | POA: Insufficient documentation

## 2014-11-04 DIAGNOSIS — W1839XA Other fall on same level, initial encounter: Secondary | ICD-10-CM | POA: Insufficient documentation

## 2014-11-04 DIAGNOSIS — M1990 Unspecified osteoarthritis, unspecified site: Secondary | ICD-10-CM | POA: Insufficient documentation

## 2014-11-04 DIAGNOSIS — H269 Unspecified cataract: Secondary | ICD-10-CM | POA: Diagnosis not present

## 2014-11-04 DIAGNOSIS — Z79899 Other long term (current) drug therapy: Secondary | ICD-10-CM | POA: Insufficient documentation

## 2014-11-04 DIAGNOSIS — S022XXA Fracture of nasal bones, initial encounter for closed fracture: Secondary | ICD-10-CM | POA: Diagnosis not present

## 2014-11-04 DIAGNOSIS — S0083XA Contusion of other part of head, initial encounter: Secondary | ICD-10-CM | POA: Insufficient documentation

## 2014-11-04 DIAGNOSIS — S0993XA Unspecified injury of face, initial encounter: Secondary | ICD-10-CM | POA: Diagnosis present

## 2014-11-04 HISTORY — DX: Alcohol abuse, uncomplicated: F10.10

## 2014-11-04 LAB — COMPREHENSIVE METABOLIC PANEL
ALBUMIN: 3.8 g/dL (ref 3.5–5.0)
ALK PHOS: 66 U/L (ref 38–126)
ALT: 11 U/L — AB (ref 17–63)
AST: 22 U/L (ref 15–41)
Anion gap: 9 (ref 5–15)
BILIRUBIN TOTAL: 0.5 mg/dL (ref 0.3–1.2)
BUN: 17 mg/dL (ref 6–20)
CO2: 25 mmol/L (ref 22–32)
Calcium: 8.6 mg/dL — ABNORMAL LOW (ref 8.9–10.3)
Chloride: 102 mmol/L (ref 101–111)
Creatinine, Ser: 0.76 mg/dL (ref 0.61–1.24)
GFR calc Af Amer: >60 mL/min (ref >60)
GFR calc non Af Amer: >60 mL/min (ref >60)
Glucose, Bld: 100 mg/dL — ABNORMAL HIGH (ref 70–99)
POTASSIUM: 4.5 mmol/L (ref 3.5–5.1)
Sodium: 136 mmol/L (ref 135–145)
Total Protein: 6.5 g/dL (ref 6.5–8.1)

## 2014-11-04 LAB — CBC WITH DIFFERENTIAL/PLATELET
BASOS ABS: 0 10*3/uL (ref 0.0–0.1)
Basophils Relative: 0 % (ref 0–1)
Eosinophils Absolute: 0.3 10*3/uL (ref 0.0–0.7)
Eosinophils Relative: 4 % (ref 0–5)
HEMATOCRIT: 34.7 % — AB (ref 39.0–52.0)
HEMOGLOBIN: 11.5 g/dL — AB (ref 13.0–17.0)
Lymphocytes Relative: 23 % (ref 12–46)
Lymphs Abs: 1.8 10*3/uL (ref 0.7–4.0)
MCH: 31.7 pg (ref 26.0–34.0)
MCHC: 33.1 g/dL (ref 30.0–36.0)
MCV: 95.6 fL (ref 78.0–100.0)
MONOS PCT: 11 % (ref 3–12)
Monocytes Absolute: 0.9 10*3/uL (ref 0.1–1.0)
NEUTROS PCT: 62 % (ref 43–77)
Neutro Abs: 4.9 10*3/uL (ref 1.7–7.7)
Platelets: 195 10*3/uL (ref 150–400)
RBC: 3.63 MIL/uL — ABNORMAL LOW (ref 4.22–5.81)
RDW: 12.9 % (ref 11.5–15.5)
WBC: 7.9 10*3/uL (ref 4.0–10.5)

## 2014-11-04 LAB — ETHANOL: Alcohol, Ethyl (B): 137 mg/dL — ABNORMAL HIGH (ref <5)

## 2014-11-04 NOTE — ED Provider Notes (Signed)
CSN: [id number]     Arrival date & time 11/04/14  2059 History   First MD Initiated Contact with Patient 11/04/14 2131     Chief Complaint  Patient presents with  . Fall     (Consider location/radiation/quality/duration/timing/severity/associated sxs/prior Treatment) HPI Comments: Patient presents today after a fall that occurred just prior to arrival.  Patient is intoxicated and difficult to obtain a history from.  He does admit to drinking alcohol today, but is unclear how much.  He ambulates with a Cane at baseline.  He apparently fell while ambulating and landed on his face when he fell.  He is unclear about what caused the fall.  He reports a burning pain to his face and a headache, but denies any other symptoms at this time.  He is not on any anticoagulants.    The history is provided by the patient.    Past Medical History  Diagnosis Date  . Arthritis   . Cataract   . Alcohol abuse    Past Surgical History  Procedure Laterality Date  . Arthroscopic repair acl Right   . Orif ankle fracture Right   . Finger fracture surgery      Right 4th Finger  . Posterior cervical fusion/foraminotomy N/A 02/09/2014    Procedure: POSTERIOR CERVICAL FUSION/FORAMINOTOMY LEVEL 4  Cervical three to seven decompression and fusion with lateral mass screws;  Surgeon: [name], MD;  Location: [facility];  Service: Neurosurgery;  Laterality: N/A;  . Back surgery    . Fracture surgery     Family History  Problem Relation Age of Onset  . Cancer Mother     breast cancer   History  Substance Use Topics  . Smoking status: Current Every Day Smoker -- 0.33 packs/day for 50 years    Types: Cigarettes  . Smokeless tobacco: Not on file  . Alcohol Use: Yes     Comment: daily    Review of Systems  All other systems reviewed and are negative.     Allergies  Tramadol  Home Medications   Prior to Admission medications   Medication Sig Start Date End Date Taking? Authorizing Provider   cyclobenzaprine (FLEXERIL) 10 MG tablet Take 1 tablet (10 mg total) by mouth 3 (three) times daily as needed for muscle spasms. 11/02/14   Freeman CaldronMichael J Jeffery, PA-C  Docusate Sodium (DSS) 100 MG CAPS Take 100 mg by mouth 2 (two) times daily. Patient not taking: Reported on 08/22/2014 02/20/14   [name], PA-C  gabapentin (NEURONTIN) 300 MG capsule Take 1 capsule (300 mg total) by mouth 3 (three) times daily. 10/12/14   [name], MD  meloxicam (MOBIC) 15 MG tablet Take 15 mg by mouth daily.    Historical Provider, MD  nicotine (NICODERM CQ - DOSED IN MG/24 HR) 7 mg/24hr patch Place 1 patch (7 mg total) onto the skin daily. Patient not taking: Reported on 08/22/2014 03/02/14   [name], MD  ondansetron (ZOFRAN ODT) 4 MG disintegrating tablet 4mg  ODT q4 hours prn nausea/vomit 08/22/14   [name], PA-C  oxyCODONE-acetaminophen (PERCOCET) 10-325 MG per tablet Take 1-2 tablets by mouth every 4 (four) hours as needed for pain. 11/02/14   Freeman CaldronMichael J Jeffery, PA-C  pantoprazole (PROTONIX) 40 MG tablet Take 1 tablet (40 mg total) by mouth daily. 10/23/14   [name], MD  traZODone (DESYREL) 50 MG tablet Take 1 tablet (50 mg total) by mouth at bedtime. For sleep Patient not taking: Reported on 08/22/2014  02/20/14   Jacquelynn Cree, PA-C  Vitamin D, Ergocalciferol, (DRISDOL) 50000 UNITS CAPS capsule Take 1 capsule (50,000 Units total) by mouth every 7 (seven) days. 07/29/14   [name], MD   BP 113/75 mmHg  Pulse 75  Temp(Src) 97.5 F (36.4 C) (Oral)  Resp 16  Ht  (1.854 m)  Wt 179 lb (81.194 kg)  BMI 23.62 kg/m2  SpO2 97% Physical Exam  Constitutional: He appears well-developed and well-nourished.  HENT:  Head: Normocephalic.  Mouth/Throat: Oropharynx is clear and moist.  Large abrasion to the nose and the forehead  Eyes: Pupils are equal, round, and reactive to light.  Neck: Neck supple.  Cardiovascular: Normal rate, regular rhythm and normal heart sounds.   Pulmonary/Chest:  Effort normal and breath sounds normal.  Musculoskeletal: Normal range of motion.  Neurological: He is alert.  Patient unable to cooperate with neurological exam.  Unable to assess gait due to intoxicated state  Skin: Skin is warm and dry.  Psychiatric: He has a normal mood and affect.  Nursing note and vitals reviewed.   ED Course  Procedures (including critical care time) Labs Review Labs Reviewed  CBC WITH DIFFERENTIAL/PLATELET  COMPREHENSIVE METABOLIC PANEL  ETHANOL    Imaging Review Ct Head Wo Contrast  11/04/2014   ADDENDUM REPORT: 11/04/2014 23:50  ADDENDUM: Multilevel moderate to severe/severe neural foraminal narrowing.   Electronically Signed   By: Awilda Metro   On: 11/04/2014 23:50   11/04/2014   CLINICAL DATA:  Knee gave out, fell while walking outside. RIGHT facial abrasions. Alcohol intake. No loss of consciousness.  EXAM: CT HEAD WITHOUT CONTRAST  CT MAXILLOFACIAL WITHOUT CONTRAST  CT CERVICAL SPINE WITHOUT CONTRAST  TECHNIQUE: Multidetector CT imaging of the head, cervical spine, and maxillofacial structures were performed using the standard protocol without intravenous contrast. Multiplanar CT image reconstructions of the cervical spine and maxillofacial structures were also generated.  COMPARISON:  CT of the head and cervical spine October 04, 2014 and chest radiograph Nov 01, 2014  FINDINGS: CT HEAD FINDINGS  The ventricles and sulci are normal for age. No intraparenchymal hemorrhage, mass effect nor midline shift. Patchy supratentorial white matter hypodensities are unchanged and though non-specific suggest sequelae of chronic small vessel ischemic disease. No acute large vascular territory infarcts.  No abnormal extra-axial fluid collections. Basal cisterns are patent. Minimal calcific atherosclerosis of the carotid siphons. No skull fracture.  CT MAXILLOFACIAL FINDINGS  Mildly motion degraded examination.  The mandible is intact, the condyles are located. Acute appearing  RIGHT nasal bone fracture displaced to the RIGHT, possible nondisplaced LEFT nasal bone fracture. Displaced osseous nasal septum fracture suspected though motion degrades sensitivity. Remote RIGHT maxillary anterior wall ORIF. Remote nondisplaced RIGHT posterolateral maxillary wall fracture. Remote depressed RIGHT zygomatic arch fracture.  Remote bilateral medial orbital blowout fractures. Remote bilateral orbital floor blowout fractures. Ocular globes intact. Somewhat redundant LEFT optic nerve sheath complexes unchanged. Preservation of the retro-orbital fat.  Paranasal sinus mucosal thickening with scattered mucosal retention cyst. RIGHT anterior ethmoid small osteoma. No destructive bony lesions.  Midface soft tissue swelling and subcutaneous gas without radiopaque foreign bodies.  CT CERVICAL SPINE FINDINGS  Cervical vertebral bodies intact. Straightened cervical lordosis. Moderate to severe degenerative discs C2-3 through C6-7, unchanged. Status post C3-4 thru C6-7 laminectomies, C3 through C7 posterior instrumentation with minimal lucency about the RIGHT C7 facet screw. The LEFT C7 facet screws is within the C7-T1 facet joint space. C1-2 articulation maintained with moderate arthropathy. No destructive bony lesions.  Included prevertebral and paraspinal soft tissues are nonsuspicious. Re- demonstration of minimal RIGHT apical pneumothorax.  IMPRESSION: CT HEAD: No acute intracranial process.  Mild white matter changes, advanced for age, suggest chronic small vessel ischemic disease.  CT MAXILLOFACIAL: Mildly motion degraded examination. Acute mildly displaced RIGHT and, suspected nondisplaced LEFT nasal bone fractures. Displaced osseous nasal septum fracture is age indeterminate.  Multiple remote facial fractures, status post RIGHT maxillary ORIF. Remote bilateral medial and orbital floor blowout fractures.  CT CERVICAL SPINE: Straightened cervical lordosis without acute fracture nor malalignment.  Stable  multilevel laminectomies, with similar appearance of hardware, including minimal lucency about the RIGHT C7 facet screw concerning for hardware failure.  Chronic minimal RIGHT apical pneumothorax.  Electronically Signed: By: Awilda Metro On: 11/04/2014 23:45   Ct Cervical Spine Wo Contrast  11/04/2014   ADDENDUM REPORT: 11/04/2014 23:50  ADDENDUM: Multilevel moderate to severe/severe neural foraminal narrowing.   Electronically Signed   By: Awilda Metro   On: 11/04/2014 23:50   11/04/2014   CLINICAL DATA:  Knee gave out, fell while walking outside. RIGHT facial abrasions. Alcohol intake. No loss of consciousness.  EXAM: CT HEAD WITHOUT CONTRAST  CT MAXILLOFACIAL WITHOUT CONTRAST  CT CERVICAL SPINE WITHOUT CONTRAST  TECHNIQUE: Multidetector CT imaging of the head, cervical spine, and maxillofacial structures were performed using the standard protocol without intravenous contrast. Multiplanar CT image reconstructions of the cervical spine and maxillofacial structures were also generated.  COMPARISON:  CT of the head and cervical spine October 04, 2014 and chest radiograph Nov 01, 2014  FINDINGS: CT HEAD FINDINGS  The ventricles and sulci are normal for age. No intraparenchymal hemorrhage, mass effect nor midline shift. Patchy supratentorial white matter hypodensities are unchanged and though non-specific suggest sequelae of chronic small vessel ischemic disease. No acute large vascular territory infarcts.  No abnormal extra-axial fluid collections. Basal cisterns are patent. Minimal calcific atherosclerosis of the carotid siphons. No skull fracture.  CT MAXILLOFACIAL FINDINGS  Mildly motion degraded examination.  The mandible is intact, the condyles are located. Acute appearing RIGHT nasal bone fracture displaced to the RIGHT, possible nondisplaced LEFT nasal bone fracture. Displaced osseous nasal septum fracture suspected though motion degrades sensitivity. Remote RIGHT maxillary anterior wall ORIF. Remote  nondisplaced RIGHT posterolateral maxillary wall fracture. Remote depressed RIGHT zygomatic arch fracture.  Remote bilateral medial orbital blowout fractures. Remote bilateral orbital floor blowout fractures. Ocular globes intact. Somewhat redundant LEFT optic nerve sheath complexes unchanged. Preservation of the retro-orbital fat.  Paranasal sinus mucosal thickening with scattered mucosal retention cyst. RIGHT anterior ethmoid small osteoma. No destructive bony lesions.  Midface soft tissue swelling and subcutaneous gas without radiopaque foreign bodies.  CT CERVICAL SPINE FINDINGS  Cervical vertebral bodies intact. Straightened cervical lordosis. Moderate to severe degenerative discs C2-3 through C6-7, unchanged. Status post C3-4 thru C6-7 laminectomies, C3 through C7 posterior instrumentation with minimal lucency about the RIGHT C7 facet screw. The LEFT C7 facet screws is within the C7-T1 facet joint space. C1-2 articulation maintained with moderate arthropathy. No destructive bony lesions. Included prevertebral and paraspinal soft tissues are nonsuspicious. Re- demonstration of minimal RIGHT apical pneumothorax.  IMPRESSION: CT HEAD: No acute intracranial process.  Mild white matter changes, advanced for age, suggest chronic small vessel ischemic disease.  CT MAXILLOFACIAL: Mildly motion degraded examination. Acute mildly displaced RIGHT and, suspected nondisplaced LEFT nasal bone fractures. Displaced osseous nasal septum fracture is age indeterminate.  Multiple remote facial fractures, status post RIGHT maxillary ORIF. Remote bilateral medial and orbital floor  blowout fractures.  CT CERVICAL SPINE: Straightened cervical lordosis without acute fracture nor malalignment.  Stable multilevel laminectomies, with similar appearance of hardware, including minimal lucency about the RIGHT C7 facet screw concerning for hardware failure.  Chronic minimal RIGHT apical pneumothorax.  Electronically Signed: By: Awilda Metro On: 11/04/2014 23:45   Ct Maxillofacial Wo Cm  11/04/2014   ADDENDUM REPORT: 11/04/2014 23:50  ADDENDUM: Multilevel moderate to severe/severe neural foraminal narrowing.   Electronically Signed   By: Awilda Metro   On: 11/04/2014 23:50   11/04/2014   CLINICAL DATA:  Knee gave out, fell while walking outside. RIGHT facial abrasions. Alcohol intake. No loss of consciousness.  EXAM: CT HEAD WITHOUT CONTRAST  CT MAXILLOFACIAL WITHOUT CONTRAST  CT CERVICAL SPINE WITHOUT CONTRAST  TECHNIQUE: Multidetector CT imaging of the head, cervical spine, and maxillofacial structures were performed using the standard protocol without intravenous contrast. Multiplanar CT image reconstructions of the cervical spine and maxillofacial structures were also generated.  COMPARISON:  CT of the head and cervical spine October 04, 2014 and chest radiograph Nov 01, 2014  FINDINGS: CT HEAD FINDINGS  The ventricles and sulci are normal for age. No intraparenchymal hemorrhage, mass effect nor midline shift. Patchy supratentorial white matter hypodensities are unchanged and though non-specific suggest sequelae of chronic small vessel ischemic disease. No acute large vascular territory infarcts.  No abnormal extra-axial fluid collections. Basal cisterns are patent. Minimal calcific atherosclerosis of the carotid siphons. No skull fracture.  CT MAXILLOFACIAL FINDINGS  Mildly motion degraded examination.  The mandible is intact, the condyles are located. Acute appearing RIGHT nasal bone fracture displaced to the RIGHT, possible nondisplaced LEFT nasal bone fracture. Displaced osseous nasal septum fracture suspected though motion degrades sensitivity. Remote RIGHT maxillary anterior wall ORIF. Remote nondisplaced RIGHT posterolateral maxillary wall fracture. Remote depressed RIGHT zygomatic arch fracture.  Remote bilateral medial orbital blowout fractures. Remote bilateral orbital floor blowout fractures. Ocular globes intact. Somewhat  redundant LEFT optic nerve sheath complexes unchanged. Preservation of the retro-orbital fat.  Paranasal sinus mucosal thickening with scattered mucosal retention cyst. RIGHT anterior ethmoid small osteoma. No destructive bony lesions.  Midface soft tissue swelling and subcutaneous gas without radiopaque foreign bodies.  CT CERVICAL SPINE FINDINGS  Cervical vertebral bodies intact. Straightened cervical lordosis. Moderate to severe degenerative discs C2-3 through C6-7, unchanged. Status post C3-4 thru C6-7 laminectomies, C3 through C7 posterior instrumentation with minimal lucency about the RIGHT C7 facet screw. The LEFT C7 facet screws is within the C7-T1 facet joint space. C1-2 articulation maintained with moderate arthropathy. No destructive bony lesions. Included prevertebral and paraspinal soft tissues are nonsuspicious. Re- demonstration of minimal RIGHT apical pneumothorax.  IMPRESSION: CT HEAD: No acute intracranial process.  Mild white matter changes, advanced for age, suggest chronic small vessel ischemic disease.  CT MAXILLOFACIAL: Mildly motion degraded examination. Acute mildly displaced RIGHT and, suspected nondisplaced LEFT nasal bone fractures. Displaced osseous nasal septum fracture is age indeterminate.  Multiple remote facial fractures, status post RIGHT maxillary ORIF. Remote bilateral medial and orbital floor blowout fractures.  CT CERVICAL SPINE: Straightened cervical lordosis without acute fracture nor malalignment.  Stable multilevel laminectomies, with similar appearance of hardware, including minimal lucency about the RIGHT C7 facet screw concerning for hardware failure.  Chronic minimal RIGHT apical pneumothorax.  Electronically Signed: By: Awilda Metro On: 11/04/2014 23:45     EKG Interpretation None      MDM   Final diagnoses:  None   Patient presents today intoxicated after a fall  that occurred just prior to arrival.  CT head, Maxillofacial, and Cervical spine ordered.   The only acute finding was a nasal bone fracture.  Patient with multiple facial abrasions, but no lacerations.  VSS.  Patient has become more alert during ED course.  Patient signed out to Donetta PottsBeth Tysinger, NP at shift change.  Plan is for the patient to be reassessed once he sobers up and also to ambulate patient.     Santiago GladHeather Aldean Suddeth, PA-C 11/06/14 1929  Rolan BuccoMelanie Belfi, MD 11/06/14 [phone]

## 2014-11-04 NOTE — ED Notes (Signed)
Patient via [facility] after a fall while walking outside. Patient states his "right knee gave out" patient states he has arthritis to right knee. Patient presents with abrasions to right eye brow, nose, and top lip-bleeding is controlled. Patient also states pain to right side. ETOH on board, and patient states he has been taking pain medication today. Patient is lethargic and sleeping through triage.

## 2014-11-04 NOTE — ED Notes (Signed)
Bed: [id number] Expected date:  Expected time:  Means of arrival:  Comments: EMS/ETOH/fall/immobilized

## 2014-11-04 NOTE — ED Notes (Signed)
Patient denies LOC

## 2014-11-05 LAB — URINALYSIS, ROUTINE W REFLEX MICROSCOPIC
BILIRUBIN URINE: NEGATIVE
GLUCOSE, UA: NEGATIVE mg/dL
Hgb urine dipstick: NEGATIVE
KETONES UR: NEGATIVE mg/dL
LEUKOCYTES UA: NEGATIVE
Nitrite: NEGATIVE
PROTEIN: NEGATIVE mg/dL
Specific Gravity, Urine: 1.009 (ref 1.005–1.030)
Urobilinogen, UA: 0.2 mg/dL (ref 0.0–1.0)
pH: 5 (ref 5.0–8.0)

## 2014-11-05 LAB — RAPID URINE DRUG SCREEN, HOSP PERFORMED
Amphetamines: NOT DETECTED
BARBITURATES: NOT DETECTED
Benzodiazepines: NOT DETECTED
COCAINE: NOT DETECTED
Opiates: POSITIVE — AB
Tetrahydrocannabinol: NOT DETECTED

## 2014-11-05 NOTE — ED Notes (Signed)
Patient verbalizes understanding of discharge instructions, home care and follow up care. Patient out of department at this time. 

## 2014-11-05 NOTE — ED Notes (Signed)
Patient attempting to call ride to pick him up

## 2014-11-05 NOTE — ED Notes (Signed)
Pt. Wound abrasion cleaned up with saline on 4 x 4 per PA,Heather.

## 2014-11-05 NOTE — ED Notes (Signed)
Patient ambulatory in hallway 7430feet using personal cane, patient had slow guarded but steady gait

## 2014-11-05 NOTE — ED Provider Notes (Signed)
2:15 AM: At end of shift, received hand-off report from Santiago GladHeather Laisure, PA-C.  Plan includes monitor pt and re-eval when he is alert, conversant and ambulatory and then discharge home with ENT follow-up.   5:30 AM: Pt alert and oriented with a GCS of 15. Discussed plan to ambulate in the ED and follow-up with ENT for eval of nasal fracture. Pt is well-appearing, in no acute distress and vital signs reviewed and not concerning.  He appears safe to be discharged.  Discharge include follow-up with their PCP.  Return precautions provided.   6:00 AM: At end of shift, hand off report given to [facility], PA-C. Awaiting for pt to be ambulated, pt may be discharged with plan discussed once ambulatory.    Filed Vitals:   11/05/14 0010 11/05/14 0240 11/05/14 0346 11/05/14 0545  BP: 94/65 122/69 100/60 107/79  Pulse: 68 54 59 78  Temp:  97.5 F (36.4 C)    TempSrc:  Oral    Resp: 16 16 16 16   Height:      Weight:      SpO2: 94% 96% 97% 96%   Meds given in ED:  Medications - No data to display  Discharge Medication List as of 11/05/2014  6:37 AM        Harle BattiestElizabeth Kalif Kattner, NP 11/05/14 1656  Marisa Severinlga Otter, MD 11/06/14 0151

## 2014-11-05 NOTE — Discharge Instructions (Signed)
Please follow the directions provided.  Be sure to follow-up with your primary care provider to ensure you are getting better.  Also, follow-up with the Ear, Nose and Throat doctor regarding your broken nose.  You may take your prescribed medications as directed.  Don't hesitate to return for any new, worsening or concerning symptoms.     SEEK IMMEDIATE MEDICAL CARE IF:  You have bleeding from your nose that does not stop after 20 minutes of pinching the nostrils closed and keeping ice on the nose.  You have clear fluid draining from your nose.  You notice a grape-like swelling on the dividing wall between the nostrils (septum). This is a collection of blood (hematoma) that must be drained to help prevent infection.  You have difficulty moving your eyes.  You have recurrent vomiting.

## 2014-11-09 ENCOUNTER — Ambulatory Visit
Admission: RE | Admit: 2014-11-09 | Discharge: 2014-11-09 | Disposition: A | Discharge status: Home / Self Care | Payer: No Typology Code available for payment source | Source: Ambulatory Visit | Attending: Orthopaedic Surgery | Admitting: Orthopaedic Surgery

## 2014-11-09 DIAGNOSIS — M25561 Pain in right knee: Secondary | ICD-10-CM

## 2014-11-10 ENCOUNTER — Emergency Department (HOSPITAL_COMMUNITY)
Admission: EM | Admit: 2014-11-10 | Discharge: 2014-11-11 | Disposition: A | Discharge status: Home / Self Care | Payer: No Typology Code available for payment source | Attending: Emergency Medicine | Admitting: Emergency Medicine

## 2014-11-10 ENCOUNTER — Encounter (HOSPITAL_COMMUNITY): Payer: Self-pay | Admitting: Nurse Practitioner

## 2014-11-10 ENCOUNTER — Emergency Department (HOSPITAL_COMMUNITY): Payer: No Typology Code available for payment source

## 2014-11-10 DIAGNOSIS — R079 Chest pain, unspecified: Secondary | ICD-10-CM

## 2014-11-10 DIAGNOSIS — R110 Nausea: Secondary | ICD-10-CM | POA: Diagnosis not present

## 2014-11-10 DIAGNOSIS — Z8709 Personal history of other diseases of the respiratory system: Secondary | ICD-10-CM | POA: Insufficient documentation

## 2014-11-10 DIAGNOSIS — H269 Unspecified cataract: Secondary | ICD-10-CM | POA: Insufficient documentation

## 2014-11-10 DIAGNOSIS — Z791 Long term (current) use of non-steroidal anti-inflammatories (NSAID): Secondary | ICD-10-CM | POA: Diagnosis not present

## 2014-11-10 DIAGNOSIS — M1990 Unspecified osteoarthritis, unspecified site: Secondary | ICD-10-CM | POA: Diagnosis not present

## 2014-11-10 DIAGNOSIS — Z8781 Personal history of (healed) traumatic fracture: Secondary | ICD-10-CM | POA: Diagnosis not present

## 2014-11-10 DIAGNOSIS — R0789 Other chest pain: Secondary | ICD-10-CM | POA: Insufficient documentation

## 2014-11-10 DIAGNOSIS — Z720 Tobacco use: Secondary | ICD-10-CM | POA: Insufficient documentation

## 2014-11-10 DIAGNOSIS — R0781 Pleurodynia: Secondary | ICD-10-CM | POA: Diagnosis present

## 2014-11-10 DIAGNOSIS — Z79899 Other long term (current) drug therapy: Secondary | ICD-10-CM | POA: Insufficient documentation

## 2014-11-10 LAB — CBC WITH DIFFERENTIAL/PLATELET
BASOS ABS: 0 10*3/uL (ref 0.0–0.1)
Basophils Relative: 0 % (ref 0–1)
EOS PCT: 7 % — AB (ref 0–5)
Eosinophils Absolute: 0.4 10*3/uL (ref 0.0–0.7)
HEMATOCRIT: 39.7 % (ref 39.0–52.0)
Hemoglobin: 14 g/dL (ref 13.0–17.0)
LYMPHS PCT: 30 % (ref 12–46)
Lymphs Abs: 1.7 10*3/uL (ref 0.7–4.0)
MCH: 32.6 pg (ref 26.0–34.0)
MCHC: 35.3 g/dL (ref 30.0–36.0)
MCV: 92.3 fL (ref 78.0–100.0)
MONO ABS: 0.6 10*3/uL (ref 0.1–1.0)
Monocytes Relative: 10 % (ref 3–12)
Neutro Abs: 3.1 10*3/uL (ref 1.7–7.7)
Neutrophils Relative %: 53 % (ref 43–77)
Platelets: 256 10*3/uL (ref 150–400)
RBC: 4.3 MIL/uL (ref 4.22–5.81)
RDW: 12.7 % (ref 11.5–15.5)
WBC: 5.8 10*3/uL (ref 4.0–10.5)

## 2014-11-10 LAB — ETHANOL: ALCOHOL ETHYL (B): 52 mg/dL — AB (ref <5)

## 2014-11-10 LAB — BASIC METABOLIC PANEL
Anion gap: 11 (ref 5–15)
BUN: 8 mg/dL (ref 6–20)
CALCIUM: 9.3 mg/dL (ref 8.9–10.3)
CO2: 24 mmol/L (ref 22–32)
CREATININE: 0.78 mg/dL (ref 0.61–1.24)
Chloride: 100 mmol/L — ABNORMAL LOW (ref 101–111)
GLUCOSE: 106 mg/dL — AB (ref 70–99)
POTASSIUM: 3.9 mmol/L (ref 3.5–5.1)
Sodium: 135 mmol/L (ref 135–145)

## 2014-11-10 MED ORDER — HYDROCODONE-ACETAMINOPHEN 5-325 MG PO TABS
2.0000 | ORAL_TABLET | Freq: Once | ORAL | Status: AC
  Administered 2014-11-10: 2 via ORAL
  Filled 2014-11-10: qty 2

## 2014-11-10 MED ORDER — ONDANSETRON 4 MG PO TBDP: ORAL_TABLET | ORAL | Status: DC

## 2014-11-10 NOTE — Discharge Summary (Signed)
Physician Discharge Summary  Patient ID: Gary Davis MRN: [id number] DOB/AGE: 61/12/1955 61 y.o.  Admit date: 10/30/2014 Discharge date: 11/02/2014  Discharge Diagnoses Patient Active Problem List   Diagnosis Date Noted  . Fall 11/02/2014  . Rib fractures 10/31/2014  . Esophageal reflux 03/02/2014  . Anemia, unspecified 03/02/2014  . Insomnia 03/02/2014  . IFG (impaired fasting glucose) 03/02/2014  . Central cord syndrome 02/11/2014  . Myelopathy, spondylogenic, cervical 02/09/2014  . Bilateral arm weakness 02/04/2014  . Cord compression syndrome 02/04/2014  . Tobacco use disorder 02/04/2014  . Arthritis 02/04/2014    Consultants None   Procedures None   HPI: Gary Davis [facility] for chest pain after a fall. He was walking in the park Friday, 4/29, and fell around 6 PM on a park bench.He went to [facility] he lives [facility] but because of severe chest pain came to the ER by ambulance.Workup demonstrated right rib fractures with a minimal pneumothorax. He was admitted and [facility] care.   Hospital Course: The patient did not suffer any respiratory compromise from his rib fractures. His pain was controlled with oral medications. He was mobilized with physical and occupational therapies who recommended discharge home. He was able to be discharged home with his brother in good condition after a couple of days.     Medication List    STOP taking these medications        HYDROcodone-acetaminophen 5-325 MG per tablet  Commonly known as:  NORCO     ibuprofen 800 MG tablet  Commonly known as:  ADVIL,MOTRIN     naproxen 500 MG tablet  Commonly known as:  NAPROSYN     Oxycodone HCl 20 MG Tabs     tiZANidine 2 MG tablet  Commonly known as:  ZANAFLEX     traMADol 50 MG tablet  Commonly known as:  ULTRAM      TAKE these medications        cyclobenzaprine 10 MG tablet  Commonly known as:  FLEXERIL  Take 1 tablet (10  mg total) by mouth 3 (three) times daily as needed for muscle spasms.     DSS 100 MG Caps  Take 100 mg by mouth 2 (two) times daily.     gabapentin 300 MG capsule  Commonly known as:  NEURONTIN  Take 1 capsule (300 mg total) by mouth 3 (three) times daily.     meloxicam 15 MG tablet  Commonly known as:  MOBIC  Take 15 mg by mouth daily.     nicotine 7 mg/24hr patch  Commonly known as:  NICODERM CQ - dosed in mg/24 hr  Place 1 patch (7 mg total) onto the skin daily.     ondansetron 4 MG disintegrating tablet  Commonly known as:  ZOFRAN ODT  4mg  ODT q4 hours prn nausea/vomit     oxyCODONE-acetaminophen 10-325 MG per tablet  Commonly known as:  PERCOCET  Take 1-2 tablets by mouth every 4 (four) hours as needed for pain.     pantoprazole 40 MG tablet  Commonly known as:  PROTONIX  Take 1 tablet (40 mg total) by mouth daily.     traZODone 50 MG tablet  Commonly known as:  DESYREL  Take 1 tablet (50 mg total) by mouth at bedtime. For sleep     Vitamin D (Ergocalciferol) 50000 UNITS Caps capsule  Commonly known as:  DRISDOL  Take 1 capsule (50,000 Units total) by mouth every 7 (seven)  days.            Follow-up Information    Call [facility].   Why:  As needed   Contact information:   [address] [place] [phone]       Signed: Freeman CaldronMichael J. Daizy Outen, PA-C Pager: [phone] [facility] Tra[facility] PA Pager: [phone] 11/10/2014, 10:23 AM

## 2014-11-10 NOTE — ED Notes (Signed)
Patient ambulated in hallways with two person assist- at baseline uses cane. Patient SpO2 maintained between 95-98%/RA. Pt denies dizziness or lightheadedness. Had one episode of shortness of breath that required momentary break for 10-20 seconds. Respirations 20 at the time. Patient endorses severe pain not made worse but stable with movement.   Tresa EndoKelly PA made aware.

## 2014-11-10 NOTE — ED Notes (Signed)
EMS-pt to ED today for continuous rib pain after falling 6 days ago. Pt reports diagnoses of fracture ribs to right side and facial abrasions. Patient states he was prescribed ASA and Oxy 10mg  but has not taken since this weekend due nausea associated with the medications. Patient reports he has had shortness of breath, however this has not worsened. Vitals stable in route. Patient normally walks with cane due to tendency for unsteady ambulation from arthritis to joints.

## 2014-11-10 NOTE — ED Provider Notes (Signed)
CSN: [id number]     Arrival date & time 11/10/14  2058 History   First MD Initiated Contact with Patient 11/10/14 2110     Chief Complaint  Patient presents with  . Rib pain     (Consider location/radiation/quality/duration/timing/severity/associated sxs/prior Treatment) HPI Comments: Patient is a 59 year old male with a history of arthritis and alcohol abuse who presents to the emergency department today for further evaluation of right-sided rib pain. Patient fell on 11/01/2014 causing right-sided rib fractures with a small pneumothorax. He was admitted for this and discharged 24 hours later. Patient states that he rested the weekend, but has been more active over the past 2 days. He states that his pain has worsened on the right side of his chest, especially with deep breathing. He states that he continues to hear "popping" sounds to the right side of his chest. He reports that he has not taken his Percocet tablets because it makes him nauseous; he was prescribed 60 tablets of 10-325 mg Percocet. He denies any associated syncope, fever, or new/worsening shortness of breath.  The history is provided by the patient. No language interpreter was used.    Past Medical History  Diagnosis Date  . Arthritis   . Cataract   . Alcohol abuse    Past Surgical History  Procedure Laterality Date  . Arthroscopic repair acl Right   . Orif ankle fracture Right   . Finger fracture surgery      Right 4th Finger  . Posterior cervical fusion/foraminotomy N/A 02/09/2014    Procedure: POSTERIOR CERVICAL FUSION/FORAMINOTOMY LEVEL 4  Cervical three to seven decompression and fusion with lateral mass screws;  Surgeon: [name], MD;  Location: [facility];  Service: Neurosurgery;  Laterality: N/A;  . Back surgery    . Fracture surgery     Family History  Problem Relation Age of Onset  . Cancer Mother     breast cancer   History  Substance Use Topics  . Smoking status: Current Every Day Smoker -- 0.33  packs/day for 50 years    Types: Cigarettes  . Smokeless tobacco: Not on file  . Alcohol Use: Yes     Comment: daily    Review of Systems  Constitutional: Negative for fever.  Respiratory: Negative for shortness of breath.   Cardiovascular: Positive for chest pain.  Gastrointestinal: Positive for nausea. Negative for vomiting.  Neurological: Negative for syncope.  All other systems reviewed and are negative.   Allergies  Percocet and Tramadol  Home Medications   Prior to Admission medications   Medication Sig Start Date End Date Taking? Authorizing Provider  cyclobenzaprine (FLEXERIL) 10 MG tablet Take 1 tablet (10 mg total) by mouth 3 (three) times daily as needed for muscle spasms. 11/02/14  Yes Freeman CaldronMichael J Jeffery, PA-C  gabapentin (NEURONTIN) 300 MG capsule Take 1 capsule (300 mg total) by mouth 3 (three) times daily. 10/12/14  Yes [name], MD  meloxicam (MOBIC) 15 MG tablet Take 15 mg by mouth as needed for pain.    Yes Historical Provider, MD  pantoprazole (PROTONIX) 40 MG tablet Take 1 tablet (40 mg total) by mouth daily. 10/23/14  Yes [name], MD  Docusate Sodium (DSS) 100 MG CAPS Take 100 mg by mouth 2 (two) times daily. Patient not taking: Reported on 08/22/2014 02/20/14   [name], PA-C  nicotine (NICODERM CQ - DOSED IN MG/24 HR) 7 mg/24hr patch Place 1 patch (7 mg total) onto the skin daily. Patient not taking:  Reported on 08/22/2014 03/02/14   [name], MD  ondansetron (ZOFRAN ODT) 4 MG disintegrating tablet 4mg  ODT q4 hours prn nausea/vomit 08/22/14   [name], PA-C  oxyCODONE-acetaminophen (PERCOCET) 10-325 MG per tablet Take 1-2 tablets by mouth every 4 (four) hours as needed for pain. 11/02/14   Freeman CaldronMichael J Jeffery, PA-C  traZODone (DESYREL) 50 MG tablet Take 1 tablet (50 mg total) by mouth at bedtime. For sleep 02/20/14   [name], PA-C  Vitamin D, Ergocalciferol, (DRISDOL) 50000 UNITS CAPS capsule Take 1 capsule (50,000 Units total) by mouth every 7  (seven) days. 07/29/14   [name], MD   BP 115/78 mmHg  Pulse 84  Temp(Src) 98.1 F (36.7 C) (Oral)  Resp 16  Ht 6\' 1"  (1.854 m)  Wt 178 lb (80.74 kg)  BMI 23.49 kg/m2  SpO2 99%   Physical Exam  Constitutional: He is oriented to person, place, and time. He appears well-developed and well-nourished. No distress.  Nontoxic/nonseptic appearing  HENT:  Head: Normocephalic.  Abrasions with superficial scabbing to bridge of nose  Eyes: Conjunctivae and EOM are normal. No scleral icterus.  Neck: Normal range of motion.  Cardiovascular: Normal rate, regular rhythm and intact distal pulses.   Pulmonary/Chest: Effort normal and breath sounds normal. No respiratory distress. He has no wheezes. He has no rales. He exhibits tenderness.  Clear sounds in all lung fields. Chest expansion symmetric. Tenderness to palpation to the right inferior lateral chest wall. No crepitus.  Abdominal: Soft. He exhibits no distension. There is no tenderness. There is no rebound.  Soft, nontender. No masses.  Musculoskeletal: Normal range of motion.  Neurological: He is alert and oriented to person, place, and time. He exhibits normal muscle tone. Coordination normal.  GCS 15. Patient moves extremities without ataxia.  Skin: Skin is warm and dry. No rash noted. He is not diaphoretic. No erythema. No pallor.  Psychiatric: He has a normal mood and affect. His behavior is normal.  Nursing note and vitals reviewed.   ED Course  Procedures (including critical care time) Labs Review Labs Reviewed  CBC WITH DIFFERENTIAL/PLATELET  BASIC METABOLIC PANEL  ETHANOL  URINE RAPID DRUG SCREEN (HOSP PERFORMED)    Imaging Review Dg Chest 2 View  11/10/2014   CLINICAL DATA:  Continued right-sided pleuritic chest pain after fall 1 week ago.  EXAM: CHEST  2 VIEW  COMPARISON:  11/01/2014  FINDINGS: Fractures of the right seventh through tenth ribs are present. There is a tiny right apical pneumothorax. There is a small  right effusion with pleural air-fluid level. Minimal atelectatic appearing linear opacities are present in the right base. The left lung is clear. Hilar, mediastinal and cardiac contours are normal and unchanged. Pulmonary vasculature is normal.  IMPRESSION: Right seventh through tenth rib fractures. Small right hydro pneumothorax. The pneumothorax component is very small, located in the apex.   Electronically Signed   By: Ellery Plunkaniel R Mitchell M.D.   On: 11/10/2014 21:33     EKG Interpretation None      MDM   Final diagnoses:  History of fractured rib  Chest pain    59 year old male presents to the emergency department for further evaluation of persistent right-sided chest wall pain secondary to rib fractures from a fall on 11/01/2014. Patient was admitted for monitoring and discharged 24 hours later, but states that he has not followed up with the trauma service as an outpatient because he lost his phone. He states that he has not taken  any of his prescribed 10-325 mg Percocet tablets because it makes him nauseous.  Patient has no tachypnea, dyspnea, or hypoxia today. His chest x-ray shows a stable pneumothorax as well as 7th through 10th rib fractures. No evidence of focal consolidation or pneumonia. No leukocytosis to suggest infectious etiology. Patient ambulatory in the ED without hypoxia today. He is hemodynamically stable and I believe he is stable and appropriate for discharge with instruction of follow-up with his primary doctor and/or the outpatient trauma clinic should he require additional pain control. Will prescribe Zofran for nausea on by Percocet; though patient reports being able to take Oxycodone alone without difficulty. Have stressed the need to use an incentive spirometer as instructed. Return provided and patient discharged in good condition with no unaddressed concerns.   Filed Vitals:   11/10/14 2105 11/10/14 2200 11/10/14 2339 11/10/14 2349  BP: 115/78 112/73 115/78 115/74   Pulse: 84 76 85 76  Temp: 98.1 F (36.7 C)     TempSrc: Oral     Resp: Height:  (1.854 m)     Weight: 178 lb (80.74 kg)     SpO2: 99% 96% 99% 98%     Antony Madura, PA-C 11/11/14 0001  Doug Sou, MD 11/11/14 0002

## 2014-11-10 NOTE — Discharge Instructions (Signed)
Recommend that you continue taking the pain medicine prescribed to you at discharge. You may take Zofran for any nausea/vomiting. Take Aleve as needed for persistent pain. Use an incentive spirometer once per hour while awake to ensure that you are taking deep breaths. For any further pain control, follow-up with your primary care doctor or the trauma clinic. Return to the emergency department if symptoms worsen.  Rib Fracture A rib fracture is a break or crack in one of the bones of the ribs. The ribs are a group of long, curved bones that wrap around your chest and attach to your spine. They protect your lungs and other organs in the chest cavity. A broken or cracked rib is often painful, but most do not cause other problems. Most rib fractures heal on their own over time. However, rib fractures can be more serious if multiple ribs are broken or if broken ribs move out of place and push against other structures. CAUSES   A direct blow to the chest. For example, this could happen during contact sports, a car accident, or a fall against a hard object.  Repetitive movements with high force, such as pitching a baseball or having severe coughing spells. SYMPTOMS   Pain when you breathe in or cough.  Pain when someone presses on the injured area. DIAGNOSIS  Your caregiver will perform a physical exam. Various imaging tests may be ordered to confirm the diagnosis and to look for related injuries. These tests may include a chest X-ray, computed tomography (CT), magnetic resonance imaging (MRI), or a bone scan. TREATMENT  Rib fractures usually heal on their own in 1-3 months. The longer healing period is often associated with a continued cough or other aggravating activities. During the healing period, pain control is very important. Medication is usually given to control pain. Hospitalization or surgery may be needed for more severe injuries, such as those in which multiple ribs are broken or the ribs  have moved out of place.  HOME CARE INSTRUCTIONS   Avoid strenuous activity and any activities or movements that cause pain. Be careful during activities and avoid bumping the injured rib.  Gradually increase activity as directed by your caregiver.  Only take over-the-counter or prescription medications as directed by your caregiver. Do not take other medications without asking your caregiver first.  Apply ice to the injured area for the first 1-2 days after you have been treated or as directed by your caregiver. Applying ice helps to reduce inflammation and pain.  Put ice in a plastic bag.  Place a towel between your skin and the bag.   Leave the ice on for 15-20 minutes at a time, every 2 hours while you are awake.  Perform deep breathing as directed by your caregiver. This will help prevent pneumonia, which is a common complication of a broken rib. Your caregiver may instruct you to:  Take deep breaths several times a day.  Try to cough several times a day, holding a pillow against the injured area.  Use a device called an incentive spirometer to practice deep breathing several times a day.  Drink enough fluids to keep your urine clear or pale yellow. This will help you avoid constipation.   Do not wear a rib belt or binder. These restrict breathing, which can lead to pneumonia.  SEEK IMMEDIATE MEDICAL CARE IF:   You have a fever.   You have difficulty breathing or shortness of breath.   You develop a continual cough, or  you cough up thick or bloody sputum.  You feel sick to your stomach (nausea), throw up (vomit), or have abdominal pain.   You have worsening pain not controlled with medications.  MAKE SURE YOU:  Understand these instructions.  Will watch your condition.  Will get help right away if you are not doing well or get worse. Document Released: 06/19/2005 Document Revised: 02/19/2013 Document Reviewed: 08/21/2012 [facility] Patient Information 2015  [place], [place]. This information is not intended to replace advice given to you by your health care provider. Make sure you discuss any questions you have with your health care provider.

## 2014-11-10 NOTE — ED Notes (Signed)
Kelly PA at bedside   

## 2014-12-18 ENCOUNTER — Telehealth: Payer: Self-pay | Admitting: Internal Medicine

## 2014-12-18 NOTE — Telephone Encounter (Signed)
Pt called requesting medication refill for cyclobenzaprine (FLEXERIL) 10 MG tablet and gabapentin (NEURONTIN) 300 MG capsule. Please f/u with patient, if approved pt uses [facility] on file.

## 2015-01-05 ENCOUNTER — Ambulatory Visit (HOSPITAL_BASED_OUTPATIENT_CLINIC_OR_DEPARTMENT_OTHER): Payer: No Typology Code available for payment source | Admitting: Internal Medicine

## 2015-01-05 ENCOUNTER — Encounter: Payer: Self-pay | Admitting: Internal Medicine

## 2015-01-05 ENCOUNTER — Emergency Department (HOSPITAL_COMMUNITY)
Admission: EM | Admit: 2015-01-05 | Discharge: 2015-01-05 | Disposition: A | Discharge status: Home / Self Care | Payer: No Typology Code available for payment source | Attending: Emergency Medicine | Admitting: Emergency Medicine

## 2015-01-05 ENCOUNTER — Other Ambulatory Visit: Payer: Self-pay | Admitting: Internal Medicine

## 2015-01-05 ENCOUNTER — Encounter (HOSPITAL_COMMUNITY): Payer: Self-pay | Admitting: Physical Medicine and Rehabilitation

## 2015-01-05 ENCOUNTER — Ambulatory Visit (HOSPITAL_COMMUNITY)
Admission: RE | Admit: 2015-01-05 | Discharge: 2015-01-05 | Disposition: A | Discharge status: Home / Self Care | Payer: No Typology Code available for payment source | Source: Ambulatory Visit | Attending: Internal Medicine | Admitting: Internal Medicine

## 2015-01-05 VITALS — BP 125/81 | HR 88 | Temp 98.0°F | Resp 16 | Wt 175.0 lb

## 2015-01-05 DIAGNOSIS — M1990 Unspecified osteoarthritis, unspecified site: Secondary | ICD-10-CM | POA: Diagnosis not present

## 2015-01-05 DIAGNOSIS — W01198D Fall on same level from slipping, tripping and stumbling with subsequent striking against other object, subsequent encounter: Secondary | ICD-10-CM | POA: Insufficient documentation

## 2015-01-05 DIAGNOSIS — Z79899 Other long term (current) drug therapy: Secondary | ICD-10-CM | POA: Diagnosis not present

## 2015-01-05 DIAGNOSIS — Z720 Tobacco use: Secondary | ICD-10-CM | POA: Diagnosis not present

## 2015-01-05 DIAGNOSIS — S2241XD Multiple fractures of ribs, right side, subsequent encounter for fracture with routine healing: Secondary | ICD-10-CM | POA: Insufficient documentation

## 2015-01-05 DIAGNOSIS — Z9889 Other specified postprocedural states: Secondary | ICD-10-CM | POA: Diagnosis not present

## 2015-01-05 DIAGNOSIS — Z8781 Personal history of (healed) traumatic fracture: Secondary | ICD-10-CM

## 2015-01-05 DIAGNOSIS — N400 Enlarged prostate without lower urinary tract symptoms: Secondary | ICD-10-CM | POA: Diagnosis not present

## 2015-01-05 DIAGNOSIS — W19XXXD Unspecified fall, subsequent encounter: Secondary | ICD-10-CM | POA: Insufficient documentation

## 2015-01-05 DIAGNOSIS — H269 Unspecified cataract: Secondary | ICD-10-CM | POA: Insufficient documentation

## 2015-01-05 DIAGNOSIS — J9811 Atelectasis: Secondary | ICD-10-CM | POA: Insufficient documentation

## 2015-01-05 DIAGNOSIS — R079 Chest pain, unspecified: Secondary | ICD-10-CM | POA: Diagnosis present

## 2015-01-05 DIAGNOSIS — S2231XD Fracture of one rib, right side, subsequent encounter for fracture with routine healing: Secondary | ICD-10-CM

## 2015-01-05 DIAGNOSIS — R0789 Other chest pain: Secondary | ICD-10-CM

## 2015-01-05 MED ORDER — GABAPENTIN 300 MG PO CAPS: 300.0000 mg | ORAL_CAPSULE | Freq: Three times a day (TID) | ORAL | Status: DC

## 2015-01-05 MED ORDER — OXYCODONE HCL 5 MG PO TABS
5.0000 mg | ORAL_TABLET | Freq: Once | ORAL | Status: AC
  Administered 2015-01-05: 5 mg via ORAL
  Filled 2015-01-05: qty 1

## 2015-01-05 MED ORDER — TAMSULOSIN HCL 0.4 MG PO CAPS: 0.4000 mg | ORAL_CAPSULE | Freq: Every day | ORAL | Status: DC

## 2015-01-05 MED ORDER — OXYCODONE HCL 5 MG PO TABS
5.0000 mg | ORAL_TABLET | Freq: Four times a day (QID) | ORAL | Status: DC | PRN
Start: 2015-01-05 — End: 2015-04-15

## 2015-01-05 MED ORDER — NAPROXEN 500 MG PO TABS: 500.0000 mg | ORAL_TABLET | Freq: Two times a day (BID) | ORAL | Status: DC | PRN

## 2015-01-05 NOTE — ED Notes (Signed)
Pt reports he fell on 12/12/14 and fractured several R ribs. States increased pain to R side of rib cage today. Respirations unlabored. NAD

## 2015-01-05 NOTE — Progress Notes (Signed)
Patient here for follow up from a recent fall Patient also requesting a refill on his gabapentin and  Has some paperwork for scat that needs to be filled out

## 2015-01-05 NOTE — ED Notes (Signed)
Pt states that he fell while walking in the park on 01[place]11/2015 he states his legs locked up and he fell and hit a metal bench on his right side. Pt states that he was sent here from his PCP for a xray of his ribs.

## 2015-01-05 NOTE — Discharge Instructions (Signed)
Your ribs are healing, but this takes time. Avoid leaning or laying on the side that hurts. Use ice or heat to the area to help with pain. Use naprosyn or oxycodone as needed for pain but don't drive or operate machinery while taking oxycodone. Continue using the incentive spirometer given to you. Follow up with your regular doctor in 5-7 days for recheck of ongoing symptoms and for ongoing management of your chronic rib pain. Return to the ER for changes or worsening symptoms.   Chest Wall Pain Chest wall pain is pain felt in or around the chest bones and muscles. It may take up to 6 weeks to get better. It may take longer if you are active. Chest wall pain can happen on its own. Other times, things like germs, injury, coughing, or exercise can cause the pain. HOME CARE   Avoid activities that make you tired or cause pain. Try not to use your chest, belly (abdominal), or side muscles. Do not use heavy weights.  Put ice on the sore area.  Put ice in a plastic bag.  Place a towel between your skin and the bag.  Leave the ice on for 15-20 minutes for the first 2 days.  Only take medicine as told by your doctor. GET HELP RIGHT AWAY IF:   You have more pain or are very uncomfortable.  You have a fever.  Your chest pain gets worse.  You have new problems.  You feel sick to your stomach (nauseous) or throw up (vomit).  You start to sweat or feel lightheaded.  You have a cough with mucus (phlegm).  You cough up blood. MAKE SURE YOU:   Understand these instructions.  Will watch your condition.  Will get help right away if you are not doing well or get worse. Document Released: 12/06/2007 Document Revised: 09/11/2011 Document Reviewed: 02/13/2011 [facility] Patient Information 2015 [place], [place]. This information is not intended to replace advice given to you by your health care provider. Make sure you discuss any questions you have with your health care provider.

## 2015-01-05 NOTE — Progress Notes (Signed)
MRN: [id number] Name: Gary Davis  Sex: male Age: 59 y.o. DOB: Dec 09, 1955  Allergies: Percocet and Tramadol  Chief Complaint  Patient presents with  . Follow-up    HPI: Patient is 59 y.o. male who went to the emergency room 2 months ago at that time he suffered a fall and had fractured right seventh through 10th ribs with small hydropneumothorax, subsequently followed up in the emergency room had repeat x-ray done which was stable, patient also history of arthritis in the knee as per patient he is followed up with orthopedics and received injection, today he needs refill on Neurontin and also needs application filled out for a Scat. Patient currently denies any fever chills has minimal chest discomfort.patient is also complaining of nocturia weaker urinary stream urinary frequency denies any dysuria.  Past Medical History  Diagnosis Date  . Arthritis   . Cataract   . Alcohol abuse     Past Surgical History  Procedure Laterality Date  . Arthroscopic repair acl Right   . Orif ankle fracture Right   . Finger fracture surgery      Right 4th Finger  . Posterior cervical fusion/foraminotomy N/A 02/09/2014    Procedure: POSTERIOR CERVICAL FUSION/FORAMINOTOMY LEVEL 4  Cervical three to seven decompression and fusion with lateral mass screws;  Surgeon: Mariam Dollar, MD;  Location: [facility];  Service: Neurosurgery;  Laterality: N/A;  . Back surgery    . Fracture surgery        Medication List       This list is accurate as of: 01/05/15 11:57 AM.  Always use your most recent med list.               cyclobenzaprine 10 MG tablet  Commonly known as:  FLEXERIL  Take 1 tablet (10 mg total) by mouth 3 (three) times daily as needed for muscle spasms.     DSS 100 MG Caps  Take 100 mg by mouth 2 (two) times daily.     gabapentin 300 MG capsule  Commonly known as:  NEURONTIN  Take 1 capsule (300 mg total) by mouth 3 (three) times daily.     meloxicam 15 MG tablet    Commonly known as:  MOBIC  Take 15 mg by mouth as needed for pain.     nicotine 7 mg/24hr patch  Commonly known as:  NICODERM CQ - dosed in mg/24 hr  Place 1 patch (7 mg total) onto the skin daily.     ondansetron 4 MG disintegrating tablet  Commonly known as:  ZOFRAN ODT   ODT q4 hours prn nausea/vomit     oxyCODONE-acetaminophen 10-325 MG per tablet  Commonly known as:  PERCOCET  Take 1-2 tablets by mouth every 4 (four) hours as needed for pain.     pantoprazole 40 MG tablet  Commonly known as:  PROTONIX  Take 1 tablet (40 mg total) by mouth daily.     tamsulosin 0.4 MG Caps capsule  Commonly known as:  FLOMAX  Take 1 capsule (0.4 mg total) by mouth daily.     traZODone 50 MG tablet  Commonly known as:  DESYREL  Take 1 tablet (50 mg total) by mouth at bedtime. For sleep     Vitamin D (Ergocalciferol) 50000 UNITS Caps capsule  Commonly known as:  DRISDOL  Take 1 capsule (50,000 Units total) by mouth every 7 (seven) days.        Meds ordered this encounter  Medications  .  gabapentin (NEURONTIN) 300 MG capsule    Sig: Take 1 capsule (300 mg total) by mouth 3 (three) times daily.    Dispense:  90 capsule    Refill:  0  . tamsulosin (FLOMAX) 0.4 MG CAPS capsule    Sig: Take 1 capsule (0.4 mg total) by mouth daily.    Dispense:  30 capsule    Refill:  3    There is no immunization history for the selected administration types on file for this patient.  Family History  Problem Relation Age of Onset  . Cancer Mother     breast cancer    History  Substance Use Topics  . Smoking status: Current Every Day Smoker -- 0.33 packs/day for 50 years    Types: Cigarettes  . Smokeless tobacco: Not on file  . Alcohol Use: Yes     Comment: daily    Review of Systems   As noted in HPI  Filed Vitals:   01/05/15 1054  BP: 125/81  Pulse: 88  Temp: 98 F (36.7 C)  Resp: 16    Physical Exam  Physical Exam  Constitutional: No distress.  Eyes: EOM are normal.  Pupils are equal, round, and reactive to light.  Cardiovascular: Normal rate and regular rhythm.   Pulmonary/Chest: Breath sounds normal. No respiratory distress. He has no wheezes. He has no rales.  Musculoskeletal: He exhibits no edema.  Right knee crepitation    CBC    Component Value Date/Time   WBC 5.8 11/10/2014 2146   RBC 4.30 11/10/2014 2146   HGB 14.0 11/10/2014 2146   HCT 39.7 11/10/2014 2146   PLT 256 11/10/2014 2146   MCV 92.3 11/10/2014 2146   LYMPHSABS 1.7 11/10/2014 2146   MONOABS 0.6 11/10/2014 2146   EOSABS 0.4 11/10/2014 2146   BASOSABS 0.0 11/10/2014 2146    CMP     Component Value Date/Time   NA 135 11/10/2014 2146   K 3.9 11/10/2014 2146   CL 100* 11/10/2014 2146   CO2 24 11/10/2014 2146   GLUCOSE 106* 11/10/2014 2146   BUN 8 11/10/2014 2146   CREATININE 0.78 11/10/2014 2146   CREATININE 0.89 07/27/2014 1217   CALCIUM 9.3 11/10/2014 2146   PROT 6.5 11/04/2014 2251   ALBUMIN 3.8 11/04/2014 2251   AST 22 11/04/2014 2251   ALT 11* 11/04/2014 2251   ALKPHOS 66 11/04/2014 2251   BILITOT 0.5 11/04/2014 2251   GFRNONAA >60 11/10/2014 2146   GFRNONAA >89 07/27/2014 1217   GFRAA >60 11/10/2014 2146   GFRAA >89 07/27/2014 1217    Lab Results  Component Value Date/Time   CHOL 163 07/27/2014 12:17 PM    Lab Results  Component Value Date/Time   HGBA1C 5.60 07/27/2014 11:53 AM    Lab Results  Component Value Date/Time   AST 22 11/04/2014 10:51 PM    Assessment and Plan  History of neck surgery - Plan: gabapentin (NEURONTIN) 300 MG capsule  History of rib fracture - Plan:repeat  DG Ribs Unilateral Right  Arthritis/knee Following up with orthopedics.  BPH (benign prostatic hyperplasia) - Plan: tamsulosin (FLOMAX) 0.4 MG CAPS capsule    Return in about 3 months (around 04/07/2015), or if symptoms worsen or fail to improve.   This note has been created with Education officer, environmentalDragon speech recognition software and smart phrase technology. Any  transcriptional errors are unintentional.    Doris CheadleADVANI, Markez Dowland, MD

## 2015-01-05 NOTE — ED Provider Notes (Signed)
CSN: [id number]     Arrival date & time 01/05/15  1218 History  This chart was scribed for non-physician practitioner, Allen Derry, PA-C, working with Gerhard Munch, MD by Charline Bills, ED Scribe. This patient was seen in room TR08C/TR08C and the patient's care was started at 1:39 PM.   Chief Complaint  Patient presents with  . Rib Injury    R sided rib pain   Patient is a 59 y.o. male presenting with chest pain. The history is provided by the patient. No language interpreter was used.  Chest Pain Pain location:  R chest Pain quality: sharp   Pain quality: not radiating   Pain radiates to:  Does not radiate Pain radiates to the back: no   Pain severity:  Moderate Onset quality:  Gradual Duration:  2 months (worsening last night) Timing:  Constant Progression:  Worsening Chronicity:  Recurrent Context comment:  Sleeping on R side Relieved by:  Nothing Worsened by:  Movement Associated symptoms: no abdominal pain, no cough, no fever, no headache, no nausea, no numbness, no shortness of breath, not vomiting and no weakness    HPI Comments: Gary Davis is a 59 y.o. male who presents to the Emergency Department complaining of constant right rib pain since last night. Pt reports a fall that resulted in multiple right rib fractures on 11/10/14. He has been sleeping on his back since injury but slept on his right side last night, which exacerbated pain. Pt currently rates his pain as a 10/10, sharp, non-radiating pain that is exacerbated with movement. Pt has tried Advil and ibuprofen without relief. He was seen by his PCP [name], MD at [facility] for a CXR but he came here afterwards due to severity of pain and not getting pain meds at his PCP's office, although he did not request them. Pt denies fever, chills, cough, hemoptysis, chest pain, SOB, abdominal pain, nausea, vomiting, diarrhea, melena, blood in stools, dysuria, hematuria, numbness/tingling, weakness,  bruising, leg swelling.  Past Medical History  Diagnosis Date  . Arthritis   . Cataract   . Alcohol abuse    Past Surgical History  Procedure Laterality Date  . Arthroscopic repair acl Right   . Orif ankle fracture Right   . Finger fracture surgery      Right 4th Finger  . Posterior cervical fusion/foraminotomy N/A 02/09/2014    Procedure: POSTERIOR CERVICAL FUSION/FORAMINOTOMY LEVEL 4  Cervical three to seven decompression and fusion with lateral mass screws;  Surgeon: Mariam Dollar, MD;  Location: [facility];  Service: Neurosurgery;  Laterality: N/A;  . Back surgery    . Fracture surgery     Family History  Problem Relation Age of Onset  . Cancer Mother     breast cancer   History  Substance Use Topics  . Smoking status: Current Every Day Smoker -- 0.33 packs/day for 50 years    Types: Cigarettes  . Smokeless tobacco: Not on file  . Alcohol Use: Yes     Comment: daily    Review of Systems  Constitutional: Negative for fever and chills.  Respiratory: Negative for cough and shortness of breath.   Cardiovascular: Positive for chest pain. Negative for leg swelling.  Gastrointestinal: Negative for nausea, vomiting, abdominal pain, diarrhea, constipation and blood in stool.  Genitourinary: Negative for dysuria and hematuria.  Musculoskeletal:       + Rib pain  Skin: Negative for color change.  Allergic/Immunologic: Negative for immunocompromised state.  Neurological: Negative  for weakness, numbness and headaches.  Hematological: Does not bruise/bleed easily.   10 Systems reviewed and all are negative for acute change except as noted in the HPI.  Allergies  Percocet and Tramadol  Home Medications   Prior to Admission medications   Medication Sig Start Date End Date Taking? Authorizing Provider  cyclobenzaprine (FLEXERIL) 10 MG tablet Take 1 tablet (10 mg total) by mouth 3 (three) times daily as needed for muscle spasms. 11/02/14   Freeman Caldron, PA-C  Docusate  Sodium (DSS) 100 MG CAPS Take 100 mg by mouth 2 (two) times daily. Patient not taking: Reported on 08/22/2014 02/20/14   [name], PA-C  gabapentin (NEURONTIN) 300 MG capsule Take 1 capsule (300 mg total) by mouth 3 (three) times daily. 01/05/15   [name], MD  meloxicam (MOBIC) 15 MG tablet Take 15 mg by mouth as needed for pain.     Historical Provider, MD  nicotine (NICODERM CQ - DOSED IN MG/24 HR) 7 mg/24hr patch Place 1 patch (7 mg total) onto the skin daily. Patient not taking: Reported on 08/22/2014 03/02/14   [name], MD  ondansetron (ZOFRAN ODT) 4 MG disintegrating tablet 4mg  ODT q4 hours prn nausea/vomit 11/10/14   Antony Madura, PA-C  oxyCODONE-acetaminophen (PERCOCET) 10-325 MG per tablet Take 1-2 tablets by mouth every 4 (four) hours as needed for pain. 11/02/14   Freeman Caldron, PA-C  pantoprazole (PROTONIX) 40 MG tablet Take 1 tablet (40 mg total) by mouth daily. 10/23/14   [name], MD  tamsulosin (FLOMAX) 0.4 MG CAPS capsule Take 1 capsule (0.4 mg total) by mouth daily. 01/05/15   [name], MD  traZODone (DESYREL) 50 MG tablet Take 1 tablet (50 mg total) by mouth at bedtime. For sleep 02/20/14   [name], PA-C  Vitamin D, Ergocalciferol, (DRISDOL) 50000 UNITS CAPS capsule Take 1 capsule (50,000 Units total) by mouth every 7 (seven) days. 07/29/14   [name], MD   BP 143/87 mmHg  Pulse 69  Temp(Src) 97.7 F (36.5 C) (Oral)  Resp 18  Ht 6\' 1"  (1.854 m)  Wt 176 lb (79.833 kg)  BMI 23.23 kg/m2  SpO2 98% Physical Exam  Constitutional: He is oriented to person, place, and time. Vital signs are normal. He appears well-developed and well-nourished.  Non-toxic appearance. No distress.  Afebrile, nontoxic, NAD  HENT:  Head: Normocephalic and atraumatic.  Mouth/Throat: Oropharynx is clear and moist and mucous membranes are normal.  Eyes: Conjunctivae and EOM are normal. Right eye exhibits no discharge. Left eye exhibits no discharge.  Neck: Normal range of  motion. Neck supple.  Cardiovascular: Normal rate, regular rhythm, normal heart sounds and intact distal pulses.  Exam reveals no gallop and no friction rub.   No murmur heard. RRR, nl s1/s2, no m/r/g, distal pulses intact  Pulmonary/Chest: Effort normal and breath sounds normal. No respiratory distress. He has no decreased breath sounds. He has no wheezes. He has no rhonchi. He has no rales. He exhibits bony tenderness. He exhibits no crepitus, no deformity and no retraction.    CTAB in all lung fields, no w/r/r, no hypoxia or increased WOB, speaking in full sentences, SpO2 98% on RA  R lateral chest wall with moderate TTP along 7th-10th ribs, no crepitus or bruising, no retractions or deformities.   Abdominal: Soft. Normal appearance and bowel sounds are normal. He exhibits no distension. There is no tenderness. There is no rigidity, no rebound, no guarding, no CVA tenderness, no tenderness  at McBurney's point and negative Murphy's sign.  Musculoskeletal: Normal range of motion.  Neurological: He is alert and oriented to person, place, and time. He has normal strength. No sensory deficit.  Skin: Skin is warm, dry and intact. No rash noted.  Psychiatric: He has a normal mood and affect.  Nursing note and vitals reviewed.  ED Course  Procedures (including critical care time) DIAGNOSTIC STUDIES: Oxygen Saturation is 98% on RA, normal by my interpretation.    COORDINATION OF CARE: 1:47 PM-Discussed treatment plan which includes CXR and Oxycodone with pt at bedside and pt agreed to plan.   Labs Review Labs Reviewed - No data to display  Imaging Review Dg Ribs Unilateral W/chest Right  01/05/2015   CLINICAL DATA:  Fall 1 month ago with fractured right ribs. Follow-up.  EXAM: RIGHT RIBS AND CHEST - 3+ VIEW  COMPARISON:  11/10/2014  FINDINGS: Rib fractures are again noted through the right lateral seventh through tenth ribs. Evidence for partial healing with early callus formation at the  seventh and eighth ribs. Fracture line remains evident at the ninth and tenth rib fractures.  No new scratch head no acute bony abnormality. No pneumothorax. Minimal right base atelectasis. No effusions. Heart is normal size. Left lung is clear.  IMPRESSION: Right seventh through tenth rib fractures are again noted. Fracture lines remain evident at the ninth and tenth rib fractures with partial healing of the seventh and eighth rib fractures.  Right base atelectasis.   Electronically Signed   By: Charlett NoseKevin  Dover M.D.   On: 01/05/2015 13:22    EKG Interpretation None      MDM   Final diagnoses:  Rib fracture, right, with routine healing, subsequent encounter  Right-sided chest wall pain    59 y.o. male here with R rib pain after sleeping on his right side last night. Has prior rib fx from a fall 2 months ago. Went to his PCP's appt today and he recommended repeat CXR, which was obtained and showed no new fx, partially healed fx, and no pneumothorax. Pt states he came here for pain meds since he ran out a while ago and his PCP didn't give him anything today. I feel this is a reasonable request. No hypoxia or tachycardia, tenderness to R rib cage wall. Will give him oxycodone here and send home with short supply of this. Discussed that he must f/up with his PCP for ongoing management of his pain. Discussed importance of incentive spirometer. Will have him f/up with PCP in 1wk. I explained the diagnosis and have given explicit precautions to return to the ER including for any other new or worsening symptoms. The patient understands and accepts the medical plan as it's been dictated and I have answered their questions. Discharge instructions concerning home care and prescriptions have been given. The patient is STABLE and is discharged to home in good condition.   I personally performed the services described in this documentation, which was scribed in my presence. The recorded information has been reviewed  and is accurate.  BP 143/87 mmHg  Pulse 69  Temp(Src) 97.7 F (36.5 C) (Oral)  Resp 18  Ht 6\' 1"  (1.854 m)  Wt 176 lb (79.833 kg)  BMI 23.23 kg/m2  SpO2 98%  Meds ordered this encounter  Medications  . oxyCODONE (Oxy IR/ROXICODONE) immediate release tablet 5 mg    Sig:   . naproxen (NAPROSYN) 500 MG tablet    Sig: Take 1 tablet (500 mg total) by mouth 2 (  two) times daily as needed for mild pain, moderate pain or headache (TAKE WITH MEALS.).    Dispense:  20 tablet    Refill:  0    Order Specific Question:  Supervising Provider    Answer:  MILLER, BRIAN [[id number]]  . oxyCODONE (ROXICODONE) 5 MG immediate release tablet    Sig: Take 1 tablet (5 mg total) by mouth every 6 (six) hours as needed for severe pain.    Dispense:  6 tablet    Refill:  0    Order Specific Question:  Supervising Provider    Answer:  Eber Hong [[id number]]      Benett Swoyer Camprubi-Soms, PA-C 01/05/15 1402  Gerhard Munch, MD 01/06/15 1601

## 2015-01-12 ENCOUNTER — Telehealth: Payer: Self-pay | Admitting: Internal Medicine

## 2015-01-12 NOTE — Telephone Encounter (Signed)
Pt requesting refill on naproxen (NAPROSYN) 500 MG tablet and cyclobenzaprine (FLEXERIL) 10 MG tablet be sent to [facility] on [address]

## 2015-01-14 ENCOUNTER — Telehealth: Payer: Self-pay

## 2015-01-14 ENCOUNTER — Other Ambulatory Visit: Payer: Self-pay

## 2015-01-14 MED ORDER — CYCLOBENZAPRINE HCL 10 MG PO TABS: 10.0000 mg | ORAL_TABLET | Freq: Three times a day (TID) | ORAL | Status: DC | PRN

## 2015-01-14 NOTE — Telephone Encounter (Signed)
Patient called and left message on voice mail he needed a refill Of flexeril Medication sent to the pharmacy on file Unable to notify patient  As patient has no phone at this time Patient left message that he will call back to see if the prescription has been sent

## 2015-01-14 NOTE — Telephone Encounter (Signed)
Pt calling to follow up on medication request. Please f/u with pt.

## 2015-01-14 NOTE — Telephone Encounter (Signed)
Expand All Collapse All   Pt requesting refill on naproxen (NAPROSYN) 500 MG tablet and cyclobenzaprine (FLEXERIL) 10 MG tablet be [facility] to [facility] on [address]

## 2015-01-22 MED ORDER — NAPROXEN 500 MG PO TABS: 500.0000 mg | ORAL_TABLET | Freq: Two times a day (BID) | ORAL | Status: DC | PRN

## 2015-02-05 ENCOUNTER — Telehealth: Payer: Self-pay | Admitting: Internal Medicine

## 2015-02-05 DIAGNOSIS — Z9889 Other specified postprocedural states: Secondary | ICD-10-CM

## 2015-02-05 MED ORDER — NAPROXEN 500 MG PO TABS: 500.0000 mg | ORAL_TABLET | Freq: Two times a day (BID) | ORAL | Status: DC | PRN

## 2015-02-05 MED ORDER — GABAPENTIN 300 MG PO CAPS: 300.0000 mg | ORAL_CAPSULE | Freq: Three times a day (TID) | ORAL | Status: DC

## 2015-02-05 MED ORDER — PANTOPRAZOLE SODIUM 40 MG PO TBEC: 40.0000 mg | DELAYED_RELEASE_TABLET | Freq: Every day | ORAL | Status: DC

## 2015-02-05 MED ORDER — CYCLOBENZAPRINE HCL 10 MG PO TABS: 10.0000 mg | ORAL_TABLET | Freq: Three times a day (TID) | ORAL | Status: DC | PRN

## 2015-02-05 NOTE — Telephone Encounter (Signed)
Patient called to request that medication refill for naproxen 500, pantaprozale, cyclobenzaprine, meloxicam, gabapentin. Patient needs them [facility] to the [facility] on [address]

## 2015-03-22 ENCOUNTER — Other Ambulatory Visit: Payer: Self-pay | Admitting: Internal Medicine

## 2015-04-03 DIAGNOSIS — T07XXXA Unspecified multiple injuries, initial encounter: Secondary | ICD-10-CM

## 2015-04-03 HISTORY — DX: Unspecified multiple injuries, initial encounter: T07.XXXA

## 2015-04-14 ENCOUNTER — Emergency Department (HOSPITAL_COMMUNITY): Payer: Medicaid Other

## 2015-04-14 ENCOUNTER — Encounter (HOSPITAL_COMMUNITY): Payer: Self-pay | Admitting: Family Medicine

## 2015-04-14 ENCOUNTER — Emergency Department (HOSPITAL_COMMUNITY)
Admission: EM | Admit: 2015-04-14 | Discharge: 2015-04-15 | Disposition: A | Discharge status: Home / Self Care | Payer: Medicaid Other | Source: Home / Self Care | Attending: Emergency Medicine | Admitting: Emergency Medicine

## 2015-04-14 DIAGNOSIS — M4802 Spinal stenosis, cervical region: Secondary | ICD-10-CM

## 2015-04-14 DIAGNOSIS — R200 Anesthesia of skin: Secondary | ICD-10-CM

## 2015-04-14 DIAGNOSIS — M5412 Radiculopathy, cervical region: Secondary | ICD-10-CM

## 2015-04-14 DIAGNOSIS — S129XXA Fracture of neck, unspecified, initial encounter: Secondary | ICD-10-CM

## 2015-04-14 DIAGNOSIS — S20211A Contusion of right front wall of thorax, initial encounter: Secondary | ICD-10-CM

## 2015-04-14 LAB — I-STAT CHEM 8, ED
BUN: 18 mg/dL (ref 6–20)
CALCIUM ION: 1.18 mmol/L (ref 1.13–1.30)
CREATININE: 0.9 mg/dL (ref 0.61–1.24)
Chloride: 102 mmol/L (ref 101–111)
GLUCOSE: 90 mg/dL (ref 65–99)
HCT: 45 % (ref 39.0–52.0)
HEMOGLOBIN: 15.3 g/dL (ref 13.0–17.0)
Potassium: 3.9 mmol/L (ref 3.5–5.1)
Sodium: 139 mmol/L (ref 135–145)
TCO2: 27 mmol/L (ref 0–100)

## 2015-04-14 LAB — CBC WITH DIFFERENTIAL/PLATELET
BASOS ABS: 0 10*3/uL (ref 0.0–0.1)
Basophils Relative: 0 %
EOS PCT: 2 %
Eosinophils Absolute: 0.1 10*3/uL (ref 0.0–0.7)
HEMATOCRIT: 41 % (ref 39.0–52.0)
Hemoglobin: 14 g/dL (ref 13.0–17.0)
LYMPHS PCT: 27 %
Lymphs Abs: 1.7 10*3/uL (ref 0.7–4.0)
MCH: 33.3 pg (ref 26.0–34.0)
MCHC: 34.1 g/dL (ref 30.0–36.0)
MCV: 97.6 fL (ref 78.0–100.0)
MONO ABS: 0.7 10*3/uL (ref 0.1–1.0)
MONOS PCT: 11 %
NEUTROS ABS: 3.8 10*3/uL (ref 1.7–7.7)
Neutrophils Relative %: 60 %
PLATELETS: 176 10*3/uL (ref 150–400)
RBC: 4.2 MIL/uL — ABNORMAL LOW (ref 4.22–5.81)
RDW: 13.3 % (ref 11.5–15.5)
WBC: 6.3 10*3/uL (ref 4.0–10.5)

## 2015-04-14 MED ORDER — GADOBENATE DIMEGLUMINE 529 MG/ML IV SOLN
18.0000 mL | Freq: Once | INTRAVENOUS | Status: AC | PRN
  Administered 2015-04-14: 18 mL via INTRAVENOUS

## 2015-04-14 MED ORDER — OXYCODONE HCL 5 MG PO TABS
5.0000 mg | ORAL_TABLET | Freq: Once | ORAL | Status: AC
  Administered 2015-04-14: 5 mg via ORAL
  Filled 2015-04-14: qty 1

## 2015-04-14 MED ORDER — OXYCODONE HCL 5 MG PO TABS
5.0000 mg | ORAL_TABLET | Freq: Once | ORAL | Status: AC
  Administered 2015-04-15: 5 mg via ORAL
  Filled 2015-04-14: qty 1

## 2015-04-14 MED ORDER — DEXAMETHASONE SODIUM PHOSPHATE 10 MG/ML IJ SOLN
10.0000 mg | Freq: Once | INTRAMUSCULAR | Status: AC
  Administered 2015-04-15: 10 mg via INTRAVENOUS
  Filled 2015-04-14: qty 1

## 2015-04-14 NOTE — ED Provider Notes (Signed)
CSN: [id number]     Arrival date & time 04/14/15  1450 History  By signing my name below, I, Budd Palmer, attest that this documentation has been prepared under the direction and in the presence of [employer], PA-C. Electronically Signed: Budd Palmer, ED Scribe. 04/14/2015. 6:05 PM.     Chief Complaint  Patient presents with  . Fall  . Rib Injury   The history is provided by the patient. No language interpreter was used.   HPI Comments: Gary Davis is a 59 y.o. male with a PMHx of arthritis who presents to the Emergency Department complaining of a fall and subsequent rib injury that occurred last night. Pt states his right knee went out from under him, causing him to fall forward, hitting his ribcage and left arm on a couch. He reports associated rib pain (worse on the right), neck pain (resolved today), sharp left shoulder and arm pain, as well as worsened weakness to both hands. He notes he has arthritis in his knees and in his hands. He reports a PSHx of posterior cervical fusion.  Past Medical History  Diagnosis Date  . Arthritis   . Cataract   . Alcohol abuse    Past Surgical History  Procedure Laterality Date  . Arthroscopic repair acl Right   . Orif ankle fracture Right   . Finger fracture surgery      Right 4th Finger  . Posterior cervical fusion/foraminotomy N/A 02/09/2014    Procedure: POSTERIOR CERVICAL FUSION/FORAMINOTOMY LEVEL 4  Cervical three to seven decompression and fusion with lateral mass screws;  Surgeon: Mariam Dollar, MD;  Location: [facility];  Service: Neurosurgery;  Laterality: N/A;  . Back surgery    . Fracture surgery     Family History  Problem Relation Age of Onset  . Cancer Mother     breast cancer   Social History  Substance Use Topics  . Smoking status: Current Every Day Smoker -- 0.33 packs/day for 50 years    Types: Cigarettes  . Smokeless tobacco: None  . Alcohol Use: Yes     Comment: daily    Review of Systems   Constitutional: Negative for fever.  Musculoskeletal: Positive for myalgias. Negative for neck pain.  Skin: Negative for wound.  Neurological: Positive for weakness.    Allergies  Percocet and Tramadol  Home Medications   Prior to Admission medications   Medication Sig Start Date End Date Taking? Authorizing Provider  cyclobenzaprine (FLEXERIL) 10 MG tablet TAKE 1 TABLET (10 MG TOTAL) BY MOUTH 3 (THREE) TIMES DAILY AS NEEDED FOR MUSCLE SPASMS. 03/25/15   Dessa Phi, MD  Docusate Sodium (DSS) 100 MG CAPS Take 100 mg by mouth 2 (two) times daily. Patient not taking: Reported on 08/22/2014 02/20/14   [name], PA-C  gabapentin (NEURONTIN) 300 MG capsule TAKE 1 CAPSULE (300 MG TOTAL) BY MOUTH 3 (THREE) TIMES DAILY. 03/25/15   Josalyn Funches, MD  meloxicam (MOBIC) 15 MG tablet Take 15 mg by mouth as needed for pain.     Historical Provider, MD  naproxen (NAPROSYN) 500 MG tablet TAKE 1 TABLET BY MOUTH TWICE A DAY AS NEEDED FOR MILD-MODERATE PAIN/HEADACHE. TAKE WITH FOOD. 03/25/15   Dessa Phi, MD  nicotine (NICODERM CQ - DOSED IN MG/24 HR) 7 mg/24hr patch Place 1 patch (7 mg total) onto the skin daily. Patient not taking: Reported on 08/22/2014 03/02/14   [name], MD  ondansetron (ZOFRAN ODT) 4 MG disintegrating tablet 4mg  ODT q4 hours  prn nausea/vomit 11/10/14   Antony Madura, PA-C  oxyCODONE (ROXICODONE) 5 MG immediate release tablet Take 1 tablet (5 mg total) by mouth every 6 (six) hours as needed for severe pain. 01/05/15   Mercedes Camprubi-Soms, PA-C  oxyCODONE-acetaminophen (PERCOCET) 10-325 MG per tablet Take 1-2 tablets by mouth every 4 (four) hours as needed for pain. 11/02/14   Freeman Caldron, PA-C  pantoprazole (PROTONIX) 40 MG tablet Take 1 tablet (40 mg total) by mouth daily. 02/05/15   [name], MD  tamsulosin (FLOMAX) 0.4 MG CAPS capsule Take 1 capsule (0.4 mg total) by mouth daily. 01/05/15   [name], MD  traZODone (DESYREL) 50 MG tablet Take 1 tablet (50 mg  total) by mouth at bedtime. For sleep 02/20/14   [name], PA-C  Vitamin D, Ergocalciferol, (DRISDOL) 50000 UNITS CAPS capsule Take 1 capsule (50,000 Units total) by mouth every 7 (seven) days. 07/29/14   [name], MD   BP 124/84 mmHg  Pulse 72  Temp(Src) 97.4 F (36.3 C) (Oral)  Resp 16  Ht  (1.854 m)  Wt 180 lb (81.647 kg)  BMI 23.75 kg/m2  SpO2 100% Physical Exam  Constitutional: He is oriented to person, place, and time. He appears well-developed and well-nourished.  HENT:  Head: Normocephalic.  Eyes: EOM are normal.  Neck: Normal range of motion. Neck supple.  Midline cervical spine tenderness. Pain with ROM  Pulmonary/Chest: Effort normal.  Abdominal: He exhibits no distension.  Musculoskeletal: Normal range of motion.  Full rom of bilateral upper extremities at all joints.   Neurological: He is alert and oriented to person, place, and time.  Decreased strength bilateral upper extremities. Decreased grip strength bilaterally. Decreased strength with wrist flexion bilaterally. Decreased sensation in all dermatomes in dorsal and ventral surfaces of the hand.  Skin: Skin is warm and dry.  Psychiatric: He has a normal mood and affect.  Nursing note and vitals reviewed.   ED Course  Procedures  DIAGNOSTIC STUDIES: Oxygen Saturation is 95% on RA, adequate by my interpretation.    COORDINATION OF CARE: 4:20 PM - Discussed plans to order diagnostic imaging. Pt advised of plan for treatment and pt agrees.  Labs Review Labs Reviewed  CBC WITH DIFFERENTIAL/PLATELET - Abnormal; Notable for the following:    RBC 4.20 (*)    All other components within normal limits  I-STAT CHEM 8, ED    Imaging Review Dg Ribs Unilateral W/chest Right  04/14/2015  CLINICAL DATA:  Fall last night.  Right rib pain. EXAM: RIGHT RIBS AND CHEST - 3+ VIEW COMPARISON:  01/05/2015 FINDINGS: Old right lateral seventh through tenth rib fractures. No acute fractures visualized. Heart is  normal size. Lungs are clear. No effusions or pneumothorax. No acute bony abnormality. IMPRESSION: Old right rib fractures. No acute rib fracture seen. No active cardiopulmonary disease. Electronically Signed   By: Charlett Nose M.D.   On: 04/14/2015 17:34   Dg Cervical Spine Complete  04/14/2015  CLINICAL DATA:  Fall last night.  Right neck pain. EXAM: CERVICAL SPINE  4+ VIEWS COMPARISON:  CT 11/04/2014 FINDINGS: Posterior fusion from C3-C7. Degenerative disc disease diffusely throughout the cervical spine. Normal alignment. Prevertebral soft tissues are normal. No fracture. IMPRESSION: Posterior fusion C3-C7.  Spondylosis.  No acute findings. Electronically Signed   By: Charlett Nose M.D.   On: 04/14/2015 17:33   Mr Cervical Spine W Wo Contrast  04/14/2015  CLINICAL DATA:  Fall resulting in rib injury last night. Sharp LEFT shoulder  and arm pain, bilateral hand weakness. History of posterior cervical fusion, cord compression syndrome/myelopathy, alcohol abuse. EXAM: MRI CERVICAL SPINE WITHOUT AND WITH CONTRAST TECHNIQUE: Multiplanar and multiecho pulse sequences of the cervical spine, to include the craniocervical junction and cervicothoracic junction, were obtained according to standard protocol without and with intravenous contrast. CONTRAST:  18mL MULTIHANCE GADOBENATE DIMEGLUMINE 529 MG/ML IV SOLN COMPARISON:  Cervical spine radiographs April 14, 2015 and MRI of the cervical spine February 04, 2014 FINDINGS: The patient is status post interval C3 undersurface laminotomy, C4, C5, C6 laminectomies, posterior instrumentation resultant susceptibility artifact. Abnormal bright STIR signal within the C7 spinous process is concerning for acute mildly distracted fracture. The cervical vertebral bodies are intact, straightened cervical lordosis. Minimal grade 1 C2-3 anterolisthesis is unchanged. Moderate to severe C2-3 through C6-7 disc height loss is similar, with decreased T2 signal within this consistent with  moderate desiccation, superimposed faint bright STIR signal within the C3-4 and the C6-7 discs likely representing edema. Moderate subacute on chronic discogenic endplate changes C3-4 thru C6-7. Acute C4 inferior endplate probable Schmorl's node. No suspicious osseous or intradiscal enhancement. Approximate 3.3 cm segment of cervical spinal cord myelomalacia from C3-4 through C5-6, predominately involving the posterior columns. No syrinx or cord edema. No abnormal cord, leptomeningeal or epidural enhancement. Included prevertebral and paraspinal cyst soft tissues are nonsuspicious, mild paraspinal muscle atrophy, likely postoperative. Level by level evaluation: C2-3: Small broad-based disc bulge, moderate to severe facet arthropathy, ligamentum flavum redundancy result in mild canal stenosis, moderate to severe RIGHT neural foraminal narrowing. C3-4, C4-5, C5-6 and C6-7: Status post posterior decompression, no canal stenosis. Hardware artifact limits evaluation for neural foraminal narrowing, though there is suspected at least moderate neural foraminal narrowing from C3-4 through C5-6. C7-T1: Small broad-based disc bulge, uncovertebral hypertrophy, facet arthropathy. Probable C7 spinous process fracture with small amount of dorsal epidural hematoma. Moderate to severe canal stenosis without definite neural foraminal narrowing. IMPRESSION: Probable acute C7 spinous process fracture, resulting in small amount of dorsal epidural hematoma at C7-T1. This could be confirmed on CT of the cervical spine as clinically indicated. Status post C3 through C6 posterior decompression and instrumentation. Approximate 3.3 cm segment of cervical spinal cord myelomalacia from C3-4 through C5-6, no findings of acute cord injury. Straightened cervical lordosis without malalignment. Moderate to severe canal stenosis at C7-T1, mild at C2-3. Neural foraminal narrowing at nearly all cervical levels, moderate to severe at C2-3 and C6-7 ;  limited assessment at the levels of posterior instrumentation due to hardware artifact. Electronically Signed   By: Awilda Metroourtnay  Bloomer M.D.   On: 04/14/2015 22:12   I have personally reviewed and evaluated these images and lab results as part of my medical decision-making.   EKG Interpretation None      MDM   Final diagnoses:  Stenosis, cervical spine  Cervical radiculopathy  Rib contusion, right, initial encounter  Fracture of spinous process of cervical vertebra, initial encounter [facility](HCC)   Patient is here after a fall yesterday. Mainly complaining of right lower rib pain and weakness to bilateral hands. I reviewed patient's records. Patient with persistent numbness in bilateral hands since his surgery one year ago. He states he never regained full sensation or strength in his hands, however he states since falling yesterday his weakness and numbness is getting worse. He only reports mild pain in his neck. Discussed with Dr. Freida BusmanAllen, will get MRI to rule out central cord syndrome.  10:39 PM Patient is going for his MRI.  11:56 PM  Discussed with Dr. Wynetta Emery, advised to place in a [place] collar, CT cervical spine prior to discharge, he will see patient in the morning tomorrow. Advised management and steroids. Patient received 10 mg of Decadron emergency department. He is ambulatory. Otherwise non toxic appearing. Home with oxycodone.  Filed Vitals:   04/14/15 1504 04/14/15 1913 04/14/15 2034  BP: 107/68 121/84 124/84  Pulse: 100 89 72  Temp: 98.2 F (36.8 C) 98.1 F (36.7 C) 97.4 F (36.3 C)  TempSrc: Oral Oral Oral  Resp: Height:  (1.854 m)    Weight: 180 lb (81.647 kg)    SpO2: 95% 99% 100%    I personally performed the services described in this documentation, which was scribed in my presence. The recorded information has been reviewed and is accurate.   Jaynie Crumble, PA-C 04/15/15 1610  Lorre Nick, MD 04/18/15 2351

## 2015-04-14 NOTE — ED Notes (Signed)
Pt in Radiology.  Will medicate when he returns.

## 2015-04-14 NOTE — ED Notes (Signed)
Patient transported to MRI 

## 2015-04-14 NOTE — ED Notes (Signed)
Pt sts he fell against his couch last night and injured right ribs. Sts also both hands are hurting and left shoulder.

## 2015-04-15 ENCOUNTER — Inpatient Hospital Stay (HOSPITAL_COMMUNITY): Payer: Medicaid Other

## 2015-04-15 ENCOUNTER — Encounter (HOSPITAL_COMMUNITY)
Admission: RE | Disposition: A | Discharge status: Home / Self Care | Payer: Self-pay | Source: Ambulatory Visit | Attending: Neurosurgery

## 2015-04-15 ENCOUNTER — Encounter (HOSPITAL_COMMUNITY): Payer: Self-pay

## 2015-04-15 ENCOUNTER — Encounter (HOSPITAL_COMMUNITY): Payer: Self-pay | Admitting: *Deleted

## 2015-04-15 ENCOUNTER — Inpatient Hospital Stay (HOSPITAL_COMMUNITY): Payer: Medicaid Other | Admitting: Anesthesiology

## 2015-04-15 ENCOUNTER — Inpatient Hospital Stay (HOSPITAL_COMMUNITY)
Admission: RE | Admit: 2015-04-15 | Discharge: 2015-04-19 | DRG: 473 | Disposition: A | Discharge status: Home / Self Care | Payer: Medicaid Other | Source: Ambulatory Visit | Attending: Neurosurgery | Admitting: Neurosurgery

## 2015-04-15 ENCOUNTER — Other Ambulatory Visit (HOSPITAL_COMMUNITY): Payer: Self-pay | Admitting: Neurosurgery

## 2015-04-15 ENCOUNTER — Emergency Department (HOSPITAL_COMMUNITY): Payer: Medicaid Other

## 2015-04-15 DIAGNOSIS — K219 Gastro-esophageal reflux disease without esophagitis: Secondary | ICD-10-CM | POA: Diagnosis present

## 2015-04-15 DIAGNOSIS — S12500A Unspecified displaced fracture of sixth cervical vertebra, initial encounter for closed fracture: Secondary | ICD-10-CM | POA: Diagnosis present

## 2015-04-15 DIAGNOSIS — Z419 Encounter for procedure for purposes other than remedying health state, unspecified: Secondary | ICD-10-CM

## 2015-04-15 DIAGNOSIS — W1830XA Fall on same level, unspecified, initial encounter: Secondary | ICD-10-CM | POA: Diagnosis present

## 2015-04-15 DIAGNOSIS — F17210 Nicotine dependence, cigarettes, uncomplicated: Secondary | ICD-10-CM | POA: Diagnosis present

## 2015-04-15 DIAGNOSIS — S14107A Unspecified injury at C7 level of cervical spinal cord, initial encounter: Secondary | ICD-10-CM | POA: Diagnosis present

## 2015-04-15 DIAGNOSIS — M542 Cervicalgia: Secondary | ICD-10-CM | POA: Diagnosis present

## 2015-04-15 DIAGNOSIS — S12600A Unspecified displaced fracture of seventh cervical vertebra, initial encounter for closed fracture: Principal | ICD-10-CM | POA: Diagnosis present

## 2015-04-15 DIAGNOSIS — S129XXA Fracture of neck, unspecified, initial encounter: Secondary | ICD-10-CM | POA: Diagnosis present

## 2015-04-15 HISTORY — PX: POSTERIOR CERVICAL FUSION/FORAMINOTOMY: SHX5038

## 2015-04-15 LAB — SURGICAL PCR SCREEN
MRSA, PCR: NEGATIVE
STAPHYLOCOCCUS AUREUS: NEGATIVE

## 2015-04-15 SURGERY — POSTERIOR CERVICAL FUSION/FORAMINOTOMY LEVEL 1
Anesthesia: General | Site: Spine Cervical

## 2015-04-15 MED ORDER — GLYCOPYRROLATE 0.2 MG/ML IJ SOLN
INTRAMUSCULAR | Status: DC | PRN
  Administered 2015-04-15: 0.6 mg via INTRAVENOUS

## 2015-04-15 MED ORDER — FENTANYL CITRATE (PF) 100 MCG/2ML IJ SOLN
INTRAMUSCULAR | Status: AC
  Filled 2015-04-15: qty 2

## 2015-04-15 MED ORDER — HYDROMORPHONE HCL 1 MG/ML IJ SOLN
INTRAMUSCULAR | Status: AC
  Filled 2015-04-15: qty 1

## 2015-04-15 MED ORDER — DEXAMETHASONE SODIUM PHOSPHATE 10 MG/ML IJ SOLN
INTRAMUSCULAR | Status: AC
  Filled 2015-04-15: qty 1

## 2015-04-15 MED ORDER — DEXAMETHASONE SODIUM PHOSPHATE 10 MG/ML IJ SOLN
10.0000 mg | Freq: Four times a day (QID) | INTRAMUSCULAR | Status: DC
  Administered 2015-04-15 – 2015-04-19 (×15): 10 mg via INTRAVENOUS
  Filled 2015-04-15 (×14): qty 1

## 2015-04-15 MED ORDER — LIDOCAINE HCL (CARDIAC) 20 MG/ML IV SOLN
INTRAVENOUS | Status: DC | PRN
  Administered 2015-04-15: 100 mg via INTRAVENOUS

## 2015-04-15 MED ORDER — 0.9 % SODIUM CHLORIDE (POUR BTL) OPTIME
TOPICAL | Status: DC | PRN
  Administered 2015-04-15: 1000 mL

## 2015-04-15 MED ORDER — GLYCOPYRROLATE 0.2 MG/ML IJ SOLN
INTRAMUSCULAR | Status: AC
  Filled 2015-04-15: qty 5

## 2015-04-15 MED ORDER — LACTATED RINGERS IV SOLN
Freq: Once | INTRAVENOUS | Status: AC
  Administered 2015-04-15: 12:00:00 via INTRAVENOUS

## 2015-04-15 MED ORDER — SUCCINYLCHOLINE CHLORIDE 20 MG/ML IJ SOLN
INTRAMUSCULAR | Status: DC | PRN
  Administered 2015-04-15: 120 mg via INTRAVENOUS

## 2015-04-15 MED ORDER — FENTANYL CITRATE (PF) 250 MCG/5ML IJ SOLN
INTRAMUSCULAR | Status: AC
  Filled 2015-04-15: qty 5

## 2015-04-15 MED ORDER — PANTOPRAZOLE SODIUM 40 MG PO TBEC
40.0000 mg | DELAYED_RELEASE_TABLET | Freq: Two times a day (BID) | ORAL | Status: DC
  Administered 2015-04-15 – 2015-04-19 (×8): 40 mg via ORAL
  Filled 2015-04-15 (×9): qty 1

## 2015-04-15 MED ORDER — MIDAZOLAM HCL 5 MG/5ML IJ SOLN
INTRAMUSCULAR | Status: DC | PRN
  Administered 2015-04-15: 2 mg via INTRAVENOUS

## 2015-04-15 MED ORDER — DEXAMETHASONE SODIUM PHOSPHATE 4 MG/ML IJ SOLN
INTRAMUSCULAR | Status: DC | PRN
  Administered 2015-04-15: 4 mg via INTRAVENOUS

## 2015-04-15 MED ORDER — PROMETHAZINE HCL 25 MG/ML IJ SOLN: 6.2500 mg | INTRAMUSCULAR | Status: DC | PRN

## 2015-04-15 MED ORDER — ACETAMINOPHEN 325 MG PO TABS: 650.0000 mg | ORAL_TABLET | ORAL | Status: DC | PRN

## 2015-04-15 MED ORDER — PHENOL 1.4 % MT LIQD: 1.0000 | OROMUCOSAL | Status: DC | PRN

## 2015-04-15 MED ORDER — HYDROMORPHONE HCL 1 MG/ML IJ SOLN
INTRAMUSCULAR | Status: AC
  Administered 2015-04-15: 1 mg
  Filled 2015-04-15: qty 1

## 2015-04-15 MED ORDER — CYCLOBENZAPRINE HCL 10 MG PO TABS
10.0000 mg | ORAL_TABLET | Freq: Three times a day (TID) | ORAL | Status: DC | PRN
  Administered 2015-04-15 – 2015-04-19 (×8): 10 mg via ORAL
  Filled 2015-04-15 (×8): qty 1

## 2015-04-15 MED ORDER — HYDROMORPHONE HCL 1 MG/ML IJ SOLN
0.5000 mg | Freq: Once | INTRAMUSCULAR | Status: AC
  Administered 2015-04-15: 0.5 mg via INTRAVENOUS

## 2015-04-15 MED ORDER — ROCURONIUM BROMIDE 50 MG/5ML IV SOLN
INTRAVENOUS | Status: AC
  Filled 2015-04-15: qty 1

## 2015-04-15 MED ORDER — THROMBIN 20000 UNITS EX SOLR
CUTANEOUS | Status: DC | PRN
  Administered 2015-04-15: 18:00:00 via TOPICAL

## 2015-04-15 MED ORDER — EPHEDRINE SULFATE 50 MG/ML IJ SOLN
INTRAMUSCULAR | Status: AC
  Filled 2015-04-15: qty 2

## 2015-04-15 MED ORDER — BACITRACIN ZINC 500 UNIT/GM EX OINT
TOPICAL_OINTMENT | CUTANEOUS | Status: DC | PRN
  Administered 2015-04-15: 1 via TOPICAL

## 2015-04-15 MED ORDER — LACTATED RINGERS IV SOLN
INTRAVENOUS | Status: DC | PRN
Start: 2015-04-15 — End: 2015-04-15
  Administered 2015-04-15 (×4): via INTRAVENOUS

## 2015-04-15 MED ORDER — ROCURONIUM BROMIDE 100 MG/10ML IV SOLN
INTRAVENOUS | Status: DC | PRN
  Administered 2015-04-15 (×2): 10 mg via INTRAVENOUS
  Administered 2015-04-15 (×2): 20 mg via INTRAVENOUS
  Administered 2015-04-15 (×2): 10 mg via INTRAVENOUS
  Administered 2015-04-15: 30 mg via INTRAVENOUS

## 2015-04-15 MED ORDER — PROPOFOL 10 MG/ML IV BOLUS
INTRAVENOUS | Status: AC
  Filled 2015-04-15: qty 20

## 2015-04-15 MED ORDER — OXYCODONE-ACETAMINOPHEN 5-325 MG PO TABS
1.0000 | ORAL_TABLET | ORAL | Status: DC | PRN
  Filled 2015-04-15 (×2): qty 2

## 2015-04-15 MED ORDER — CEFAZOLIN SODIUM-DEXTROSE 2-3 GM-% IV SOLR
INTRAVENOUS | Status: AC
  Filled 2015-04-15: qty 50

## 2015-04-15 MED ORDER — GLYCOPYRROLATE 0.2 MG/ML IJ SOLN
INTRAMUSCULAR | Status: AC
  Filled 2015-04-15: qty 2

## 2015-04-15 MED ORDER — LIDOCAINE HCL (CARDIAC) 20 MG/ML IV SOLN
INTRAVENOUS | Status: AC
  Filled 2015-04-15: qty 5

## 2015-04-15 MED ORDER — FENTANYL CITRATE (PF) 100 MCG/2ML IJ SOLN
25.0000 ug | INTRAMUSCULAR | Status: DC | PRN
  Administered 2015-04-15 (×2): 25 ug via INTRAVENOUS
  Administered 2015-04-15: 50 ug via INTRAVENOUS

## 2015-04-15 MED ORDER — SODIUM CHLORIDE 0.9 % IJ SOLN: 3.0000 mL | INTRAMUSCULAR | Status: DC | PRN

## 2015-04-15 MED ORDER — PHENYLEPHRINE HCL 10 MG/ML IJ SOLN
10.0000 mg | INTRAVENOUS | Status: DC | PRN
  Administered 2015-04-15: 15 ug/min via INTRAVENOUS

## 2015-04-15 MED ORDER — CEFAZOLIN SODIUM-DEXTROSE 2-3 GM-% IV SOLR
2.0000 g | INTRAVENOUS | Status: AC
  Administered 2015-04-15: 2 g via INTRAVENOUS

## 2015-04-15 MED ORDER — CYCLOBENZAPRINE HCL 10 MG PO TABS
ORAL_TABLET | ORAL | Status: AC
  Filled 2015-04-15: qty 1

## 2015-04-15 MED ORDER — LIDOCAINE-EPINEPHRINE 1 %-1:100000 IJ SOLN
INTRAMUSCULAR | Status: DC | PRN
  Administered 2015-04-15: 3 mL

## 2015-04-15 MED ORDER — SODIUM CHLORIDE 0.9 % IV SOLN
250.0000 mL | INTRAVENOUS | Status: DC
Start: 2015-04-15 — End: 2015-04-19
  Administered 2015-04-15: 250 mL via INTRAVENOUS

## 2015-04-15 MED ORDER — DEXAMETHASONE SODIUM PHOSPHATE 10 MG/ML IJ SOLN: 10.0000 mg | INTRAMUSCULAR | Status: DC

## 2015-04-15 MED ORDER — PROPOFOL 10 MG/ML IV BOLUS
INTRAVENOUS | Status: DC | PRN
  Administered 2015-04-15: 50 mg via INTRAVENOUS
  Administered 2015-04-15: 150 mg via INTRAVENOUS

## 2015-04-15 MED ORDER — SODIUM CHLORIDE 0.9 % IJ SOLN
3.0000 mL | Freq: Two times a day (BID) | INTRAMUSCULAR | Status: DC
  Administered 2015-04-17 – 2015-04-19 (×5): 3 mL via INTRAVENOUS

## 2015-04-15 MED ORDER — ONDANSETRON HCL 4 MG/2ML IJ SOLN
INTRAMUSCULAR | Status: DC | PRN
  Administered 2015-04-15: 4 mg via INTRAVENOUS

## 2015-04-15 MED ORDER — PREDNISONE 20 MG PO TABS: 40.0000 mg | ORAL_TABLET | Freq: Every day | ORAL | Status: DC

## 2015-04-15 MED ORDER — ALUM & MAG HYDROXIDE-SIMETH 200-200-20 MG/5ML PO SUSP
30.0000 mL | Freq: Four times a day (QID) | ORAL | Status: DC | PRN
  Filled 2015-04-15: qty 30

## 2015-04-15 MED ORDER — ONDANSETRON HCL 4 MG/2ML IJ SOLN: 4.0000 mg | INTRAMUSCULAR | Status: DC | PRN

## 2015-04-15 MED ORDER — BACITRACIN 50000 UNITS IM SOLR
INTRAMUSCULAR | Status: DC | PRN
  Administered 2015-04-15: 18:00:00

## 2015-04-15 MED ORDER — BUPIVACAINE HCL (PF) 0.25 % IJ SOLN
INTRAMUSCULAR | Status: DC | PRN
  Administered 2015-04-15: 7 mL

## 2015-04-15 MED ORDER — ARTIFICIAL TEARS OP OINT
TOPICAL_OINTMENT | OPHTHALMIC | Status: AC
  Filled 2015-04-15: qty 7

## 2015-04-15 MED ORDER — NEOSTIGMINE METHYLSULFATE 10 MG/10ML IV SOLN
INTRAVENOUS | Status: AC
Start: 2015-04-15 — End: 2015-04-15
  Filled 2015-04-15: qty 1

## 2015-04-15 MED ORDER — OXYCODONE HCL 5 MG PO TABS
5.0000 mg | ORAL_TABLET | Freq: Once | ORAL | Status: AC
  Administered 2015-04-15: 5 mg via ORAL

## 2015-04-15 MED ORDER — THROMBIN 5000 UNITS EX SOLR
OROMUCOSAL | Status: DC | PRN
  Administered 2015-04-15: 18:00:00 via TOPICAL

## 2015-04-15 MED ORDER — PHENYLEPHRINE HCL 10 MG/ML IJ SOLN
INTRAMUSCULAR | Status: DC | PRN
  Administered 2015-04-15: 40 ug via INTRAVENOUS

## 2015-04-15 MED ORDER — HYDROMORPHONE HCL 1 MG/ML IJ SOLN
0.5000 mg | INTRAMUSCULAR | Status: DC | PRN
  Administered 2015-04-15 – 2015-04-16 (×3): 1 mg via INTRAVENOUS
  Filled 2015-04-15 (×2): qty 1

## 2015-04-15 MED ORDER — CEFAZOLIN SODIUM-DEXTROSE 2-3 GM-% IV SOLR
2.0000 g | Freq: Three times a day (TID) | INTRAVENOUS | Status: AC
  Administered 2015-04-15 – 2015-04-17 (×6): 2 g via INTRAVENOUS
  Filled 2015-04-15 (×7): qty 50

## 2015-04-15 MED ORDER — OXYCODONE HCL 5 MG PO TABS
10.0000 mg | ORAL_TABLET | Freq: Once | ORAL | Status: AC
  Administered 2015-04-15: 10 mg via ORAL

## 2015-04-15 MED ORDER — OXYCODONE HCL 5 MG PO TABS: 5.0000 mg | ORAL_TABLET | ORAL | Status: DC | PRN

## 2015-04-15 MED ORDER — HYDROMORPHONE HCL 1 MG/ML IJ SOLN
0.2500 mg | INTRAMUSCULAR | Status: DC | PRN
  Administered 2015-04-15 (×2): 0.5 mg via INTRAVENOUS

## 2015-04-15 MED ORDER — OXYCODONE HCL 5 MG PO TABS
ORAL_TABLET | ORAL | Status: AC
  Filled 2015-04-15: qty 10

## 2015-04-15 MED ORDER — HYDROMORPHONE HCL 1 MG/ML IJ SOLN
INTRAMUSCULAR | Status: AC
  Administered 2015-04-15: 0.5 mg via INTRAVENOUS
  Filled 2015-04-15: qty 1

## 2015-04-15 MED ORDER — ONDANSETRON HCL 4 MG/2ML IJ SOLN
INTRAMUSCULAR | Status: AC
  Filled 2015-04-15: qty 6

## 2015-04-15 MED ORDER — FENTANYL CITRATE (PF) 100 MCG/2ML IJ SOLN
INTRAMUSCULAR | Status: DC | PRN
  Administered 2015-04-15: 50 ug via INTRAVENOUS
  Administered 2015-04-15: 150 ug via INTRAVENOUS
  Administered 2015-04-15 (×2): 50 ug via INTRAVENOUS
  Administered 2015-04-15 (×2): 100 ug via INTRAVENOUS
  Administered 2015-04-15: 50 ug via INTRAVENOUS
  Administered 2015-04-15: 100 ug via INTRAVENOUS

## 2015-04-15 MED ORDER — MENTHOL 3 MG MT LOZG: 1.0000 | LOZENGE | OROMUCOSAL | Status: DC | PRN

## 2015-04-15 MED ORDER — ACETAMINOPHEN 650 MG RE SUPP: 650.0000 mg | RECTAL | Status: DC | PRN

## 2015-04-15 MED ORDER — OXYCODONE HCL 5 MG PO TABS
ORAL_TABLET | ORAL | Status: AC
  Filled 2015-04-15: qty 1

## 2015-04-15 MED ORDER — PHENYLEPHRINE HCL 10 MG/ML IJ SOLN
INTRAMUSCULAR | Status: AC
  Filled 2015-04-15: qty 2

## 2015-04-15 MED ORDER — MUPIROCIN 2 % EX OINT
1.0000 "application " | TOPICAL_OINTMENT | Freq: Once | CUTANEOUS | Status: AC
  Administered 2015-04-15: 1 via TOPICAL
  Filled 2015-04-15: qty 22

## 2015-04-15 MED ORDER — NEOSTIGMINE METHYLSULFATE 10 MG/10ML IV SOLN
INTRAVENOUS | Status: DC | PRN
  Administered 2015-04-15: 4 mg via INTRAVENOUS

## 2015-04-15 MED ORDER — MIDAZOLAM HCL 2 MG/2ML IJ SOLN
INTRAMUSCULAR | Status: AC
  Filled 2015-04-15: qty 4

## 2015-04-15 MED ORDER — DEXAMETHASONE SODIUM PHOSPHATE 4 MG/ML IJ SOLN
INTRAMUSCULAR | Status: AC
  Filled 2015-04-15: qty 1

## 2015-04-15 SURGICAL SUPPLY — 76 items
ALLOSTEM STRIP 20MMX50MM (Tissue) ×3 IMPLANT
BAG DECANTER FOR FLEXI CONT (MISCELLANEOUS) ×3 IMPLANT
BENZOIN TINCTURE PRP APPL 2/3 (GAUZE/BANDAGES/DRESSINGS) ×3 IMPLANT
BIT DRILL ANGLD TIP SCRW 4 (BIT) ×3 IMPLANT
BLADE CLIPPER SURG (BLADE) ×3 IMPLANT
BLADE SURG 11 STRL SS (BLADE) ×3 IMPLANT
BONE ALLOSTEM MORSELIZED 5CC (Bone Implant) ×6 IMPLANT
BUR MATCHSTICK NEURO 3.0 LAGG (BURR) ×3 IMPLANT
CANISTER SUCT 3000ML PPV (MISCELLANEOUS) ×3 IMPLANT
CAP ELLIPSE LOCKING (Cap) ×18 IMPLANT
CLOSURE WOUND 1/2 X4 (GAUZE/BANDAGES/DRESSINGS) ×1
DECANTER SPIKE VIAL GLASS SM (MISCELLANEOUS) ×3 IMPLANT
DRAPE C-ARM 42X72 X-RAY (DRAPES) IMPLANT
DRAPE LAPAROTOMY 100X72 PEDS (DRAPES) ×3 IMPLANT
DRAPE MICROSCOPE LEICA (MISCELLANEOUS) IMPLANT
DRAPE POUCH INSTRU U-SHP 10X18 (DRAPES) ×3 IMPLANT
DRAPE SURG 17X23 STRL (DRAPES) ×12 IMPLANT
DRSG OPSITE 4X5.5 SM (GAUZE/BANDAGES/DRESSINGS) ×3 IMPLANT
DRSG OPSITE POSTOP 4X10 (GAUZE/BANDAGES/DRESSINGS) ×3 IMPLANT
DURAPREP 26ML APPLICATOR (WOUND CARE) ×3 IMPLANT
ELECT REM PT RETURN 9FT ADLT (ELECTROSURGICAL) ×3
ELECTRODE REM PT RTRN 9FT ADLT (ELECTROSURGICAL) ×1 IMPLANT
GAUZE SPONGE 4X4 12PLY STRL (GAUZE/BANDAGES/DRESSINGS) ×3 IMPLANT
GAUZE SPONGE 4X4 16PLY XRAY LF (GAUZE/BANDAGES/DRESSINGS) ×3 IMPLANT
GLOVE BIO SURGEON STRL SZ 6.5 (GLOVE) ×4 IMPLANT
GLOVE BIO SURGEON STRL SZ7 (GLOVE) ×3 IMPLANT
GLOVE BIO SURGEON STRL SZ8 (GLOVE) ×9 IMPLANT
GLOVE BIO SURGEONS STRL SZ 6.5 (GLOVE) ×2
GLOVE BIOGEL PI IND STRL 6.5 (GLOVE) ×3 IMPLANT
GLOVE BIOGEL PI IND STRL 7.5 (GLOVE) ×2 IMPLANT
GLOVE BIOGEL PI INDICATOR 6.5 (GLOVE) ×6
GLOVE BIOGEL PI INDICATOR 7.5 (GLOVE) ×4
GLOVE ECLIPSE 7.0 STRL STRAW (GLOVE) ×6 IMPLANT
GLOVE EXAM NITRILE LRG STRL (GLOVE) IMPLANT
GLOVE EXAM NITRILE MD LF STRL (GLOVE) IMPLANT
GLOVE EXAM NITRILE XL STR (GLOVE) IMPLANT
GLOVE EXAM NITRILE XS STR PU (GLOVE) IMPLANT
GLOVE INDICATOR 8.5 STRL (GLOVE) ×6 IMPLANT
GOWN STRL REUS W/ TWL LRG LVL3 (GOWN DISPOSABLE) ×5 IMPLANT
GOWN STRL REUS W/ TWL XL LVL3 (GOWN DISPOSABLE) ×2 IMPLANT
GOWN STRL REUS W/TWL 2XL LVL3 (GOWN DISPOSABLE) IMPLANT
GOWN STRL REUS W/TWL LRG LVL3 (GOWN DISPOSABLE) ×10
GOWN STRL REUS W/TWL XL LVL3 (GOWN DISPOSABLE) ×4
HEMOSTAT POWDER SURGIFOAM 1G (HEMOSTASIS) ×3 IMPLANT
KIT BASIN OR (CUSTOM PROCEDURE TRAY) ×3 IMPLANT
KIT INFUSE XX SMALL 0.7CC (Orthopedic Implant) ×3 IMPLANT
KIT ROOM TURNOVER OR (KITS) ×3 IMPLANT
LIQUID BAND (GAUZE/BANDAGES/DRESSINGS) ×3 IMPLANT
MARKER SKIN DUAL TIP RULER LAB (MISCELLANEOUS) ×3 IMPLANT
NEEDLE HYPO 25X1 1.5 SAFETY (NEEDLE) ×3 IMPLANT
NEEDLE SPNL 20GX3.5 QUINCKE YW (NEEDLE) ×3 IMPLANT
NS IRRIG 1000ML POUR BTL (IV SOLUTION) ×3 IMPLANT
PACK LAMINECTOMY NEURO (CUSTOM PROCEDURE TRAY) ×3 IMPLANT
PAD ARMBOARD 7.5X6 YLW CONV (MISCELLANEOUS) ×9 IMPLANT
PIN MAYFIELD SKULL DISP (PIN) ×3 IMPLANT
RASP 3.0MM (RASP) ×3 IMPLANT
ROD ELLIPSE 120MM (Rod) ×3 IMPLANT
ROD SPINE POST 3.5X80 (Rod) ×3 IMPLANT
RUBBERBAND STERILE (MISCELLANEOUS) IMPLANT
SCREW 3.5X18MM (Screw) ×3 IMPLANT
SCREW ELLIPSE 3.5X20MM (Screw) ×3 IMPLANT
SCREW POLY ELLIPSE 3.5X18MM (Screw) ×3 IMPLANT
SCREW POLY ELLIPSE 4.0X22MM (Screw) ×3 IMPLANT
SPONGE LAP 4X18 X RAY DECT (DISPOSABLE) IMPLANT
SPONGE SURGIFOAM ABS GEL 100 (HEMOSTASIS) ×3 IMPLANT
STRIP CLOSURE SKIN 1/2X4 (GAUZE/BANDAGES/DRESSINGS) ×2 IMPLANT
STRIP SURGICAL 1/2 X 6 IN (GAUZE/BANDAGES/DRESSINGS) ×3 IMPLANT
SUT ETHILON 4 0 PS 2 18 (SUTURE) IMPLANT
SUT VIC AB 0 CT1 18XCR BRD8 (SUTURE) ×1 IMPLANT
SUT VIC AB 0 CT1 8-18 (SUTURE) ×2
SUT VIC AB 2-0 CT1 18 (SUTURE) ×3 IMPLANT
SUT VICRYL 4-0 PS2 18IN ABS (SUTURE) ×3 IMPLANT
TOWEL OR 17X24 6PK STRL BLUE (TOWEL DISPOSABLE) ×3 IMPLANT
TOWEL OR 17X26 10 PK STRL BLUE (TOWEL DISPOSABLE) ×3 IMPLANT
TRAY FOLEY W/METER SILVER 14FR (SET/KITS/TRAYS/PACK) IMPLANT
WATER STERILE IRR 1000ML POUR (IV SOLUTION) ×3 IMPLANT

## 2015-04-15 NOTE — Progress Notes (Signed)
I spoke with Dr. Marcene Duosobert Fitzgerald about patient's pain medication wearing off and that he would like to have something more for pain.  Dr. Sampson GoonFitzgerald will call me back.

## 2015-04-15 NOTE — Anesthesia Procedure Notes (Signed)
Procedure Name: Intubation Date/Time: 04/15/2015 5:35 PM Performed by: Daiva EvesAVENEL, Sohail Capraro W Pre-anesthesia Checklist: Patient identified, Timeout performed, Emergency Drugs available, Suction available and Patient being monitored Patient Re-evaluated:Patient Re-evaluated prior to inductionOxygen Delivery Method: Circle system utilized Preoxygenation: Pre-oxygenation with 100% oxygen Intubation Type: IV induction Ventilation: Mask ventilation without difficulty Laryngoscope Size: Glidescope and 3 Grade View: Grade I Tube size: 7.5 mm Number of attempts: 1 Airway Equipment and Method: Stylet Placement Confirmation: ETT inserted through vocal cords under direct vision,  breath sounds checked- equal and bilateral,  positive ETCO2 and CO2 detector Secured at: 22 cm Tube secured with: Tape Dental Injury: Teeth and Oropharynx as per pre-operative assessment  Difficulty Due To: Difficulty was anticipated, Difficult Airway- due to reduced neck mobility, Difficult Airway- due to cervical collar and Difficult Airway-  due to neck instability

## 2015-04-15 NOTE — Progress Notes (Signed)
Pt states his pain is better.

## 2015-04-15 NOTE — Progress Notes (Signed)
Patient arrived from PACU via two nurses. VSS and reporting pain. Oriented to room and equipment. Will administer pain medicine and continue to monitor. Suzy Bouchardhompson, Aveya Beal E, RN 04/15/2015 10:50 PM

## 2015-04-15 NOTE — H&P (Signed)
Gary Davis is an 59 y.o. male.   Chief Complaint: Neck pain bilateral arm tingling and numbness and weakness HPI: Patient is a 59 year old gentleman undergone previous C3-C7 decompression and fusion for severe myelopathy had some baseline numbness tingling in his arms and his hands weakness in his hands and difficulty walking from the original spinal cord injury prior to the surgical decompression. Over the last several weeks and months he said slightly worsening of his numbness tingling in his hands and arms. Patient had a fall on Sunday where he fell on his side injured and bruised his rib and torqued his neck. She presented the emergency room last night with neck pain was diagnosed with a spinous process fracture and he was discharged with follow-up this morning in my clinic. This morning he was complaining of numbness tingling his hands that may be little bit worse but actually have gotten worse predating the fall. In addition he is having worsening weakness in his hands that also had started happening predating the fall. Since the fall things were getting slowly worse. There is no benefit from the steroids he was placed on emergency room. He subsequent underwent CT scan that also showed a flexion distraction injury spinous process fracture small dorsal epidural hematoma at C7-T1 the level below his fusion. So because of his cervical fracture the progression of clinical syndrome and imaging of recommended emergent decompression stabilization procedure at C7-T1 with a removal of hardware from C3-C6. I've extensively gone over the risks and benefits of that operation with the patient as well as perioperative course expectations of outcome and alternatives of surgery and he understands and agrees to proceed forward.  Past Medical History  Diagnosis Date  . Arthritis   . Cataract   . Alcohol abuse     Past Surgical History  Procedure Laterality Date  . Arthroscopic repair acl Right   . Orif  ankle fracture Right   . Finger fracture surgery      Right 4th Finger  . Posterior cervical fusion/foraminotomy N/A 02/09/2014    Procedure: POSTERIOR CERVICAL FUSION/FORAMINOTOMY LEVEL 4  Cervical three to seven decompression and fusion with lateral mass screws;  Surgeon: Mariam Dollar, MD;  Location: [facility];  Service: Neurosurgery;  Laterality: N/A;  . Back surgery    . Fracture surgery      Family History  Problem Relation Age of Onset  . Cancer Mother     breast cancer   Social History:  reports that he has been smoking Cigarettes.  He has a 16.5 pack-year smoking history. He does not have any smokeless tobacco history on file. He reports that he drinks about 0.6 oz of alcohol per week. He reports that he does not use illicit drugs.  Allergies:  Allergies  Allergen Reactions  . Percocet [Oxycodone-Acetaminophen] Nausea And Vomiting    Can take oxycodone without tylenol additive  . Tramadol Nausea Only    Medications Prior to Admission  Medication Sig Dispense Refill  . cyclobenzaprine (FLEXERIL) 10 MG tablet TAKE 1 TABLET (10 MG TOTAL) BY MOUTH 3 (THREE) TIMES DAILY AS NEEDED FOR MUSCLE SPASMS. 60 tablet 0  . gabapentin (NEURONTIN) 300 MG capsule TAKE 1 CAPSULE (300 MG TOTAL) BY MOUTH 3 (THREE) TIMES DAILY. 90 capsule 0  . meloxicam (MOBIC) 15 MG tablet Take 15 mg by mouth as needed for pain.     . pantoprazole (PROTONIX) 40 MG tablet Take 1 tablet (40 mg total) by mouth daily. 30 tablet 3  .  tamsulosin (FLOMAX) 0.4 MG CAPS capsule Take 1 capsule (0.4 mg total) by mouth daily. 30 capsule 3  . oxyCODONE (ROXICODONE) 5 MG immediate release tablet Take 1 tablet (5 mg total) by mouth every 4 (four) hours as needed for severe pain. 20 tablet 0  . predniSONE (DELTASONE) 20 MG tablet Take 2 tablets (40 mg total) by mouth daily. 10 tablet 0    Results for orders placed or performed during the hospital encounter of 04/15/15 (from the past 48 hour(s))  Surgical pcr screen      Status: None   Collection Time: 04/15/15 11:39 AM  Result Value Ref Range   MRSA, PCR NEGATIVE NEGATIVE   Staphylococcus aureus NEGATIVE NEGATIVE    Comment:        The Xpert SA Assay (FDA approved for NASAL specimens in patients over 70 years of age), is one component of a comprehensive surveillance program.  Test performance has been validated by [facility] for patients greater than or equal to 17 year old. It is not intended to diagnose infection nor to guide or monitor treatment.    Dg Ribs Unilateral W/chest Right  04/14/2015  CLINICAL DATA:  Fall last night.  Right rib pain. EXAM: RIGHT RIBS AND CHEST - 3+ VIEW COMPARISON:  01/05/2015 FINDINGS: Old right lateral seventh through tenth rib fractures. No acute fractures visualized. Heart is normal size. Lungs are clear. No effusions or pneumothorax. No acute bony abnormality. IMPRESSION: Old right rib fractures. No acute rib fracture seen. No active cardiopulmonary disease. Electronically Signed   By: Charlett Nose M.D.   On: 04/14/2015 17:34   Dg Cervical Spine Complete  04/14/2015  CLINICAL DATA:  Fall last night.  Right neck pain. EXAM: CERVICAL SPINE  4+ VIEWS COMPARISON:  CT 11/04/2014 FINDINGS: Posterior fusion from C3-C7. Degenerative disc disease diffusely throughout the cervical spine. Normal alignment. Prevertebral soft tissues are normal. No fracture. IMPRESSION: Posterior fusion C3-C7.  Spondylosis.  No acute findings. Electronically Signed   By: Charlett Nose M.D.   On: 04/14/2015 17:33   Ct Cervical Spine Wo Contrast  04/15/2015  CLINICAL DATA:  Cervical spine surgery 1 year ago due to arthritis. Numbness in both hands. Patient fell last night and now has neck pain shooting into the left arm. EXAM: CT CERVICAL SPINE WITHOUT CONTRAST TECHNIQUE: Multidetector CT imaging of the cervical spine was performed without intravenous contrast. Multiplanar CT image reconstructions were also generated. COMPARISON:  MRI cervical spine  04/14/2015. Cervical spine radiographs 04/14/2015. CT cervical spine 10/04/2014. FINDINGS: Reversal of the usual cervical lordosis. This may be due to patient positioning but ligamentous injury or muscle spasm could also have this appearance and are not excluded. No anterior subluxation. Postoperative changes with posterior rod and screw fixation from C3 through C7 with posterior laminectomies from C4 through C6. Acute displaced fracture of the spinous process of C7. No involvement of the pedicles or vertebral body. No vertebral compression deformities. Degenerative changes throughout the cervical spine with intervertebral disc space narrowing and associated endplate hypertrophic changes. Bridging anterior hypertrophic sore demonstrated. C1-2 articulation appears intact. IMPRESSION: Acute displaced fracture of the spinous process of C7. Nonspecific reversal of the usual cervical lordosis without anterior subluxation. Postoperative changes with posterior laminectomies from C4 through C6 and posterior fixation from C3 through C7. Electronically Signed   By: Burman Nieves M.D.   On: 04/15/2015 00:25   Mr Cervical Spine W Wo Contrast  04/14/2015  CLINICAL DATA:  Fall resulting in rib injury last night.  Sharp LEFT shoulder and arm pain, bilateral hand weakness. History of posterior cervical fusion, cord compression syndrome/myelopathy, alcohol abuse. EXAM: MRI CERVICAL SPINE WITHOUT AND WITH CONTRAST TECHNIQUE: Multiplanar and multiecho pulse sequences of the cervical spine, to include the craniocervical junction and cervicothoracic junction, were obtained according to standard protocol without and with intravenous contrast. CONTRAST:  18mL MULTIHANCE GADOBENATE DIMEGLUMINE 529 MG/ML IV SOLN COMPARISON:  Cervical spine radiographs April 14, 2015 and MRI of the cervical spine February 04, 2014 FINDINGS: The patient is status post interval C3 undersurface laminotomy, C4, C5, C6 laminectomies, posterior  instrumentation resultant susceptibility artifact. Abnormal bright STIR signal within the C7 spinous process is concerning for acute mildly distracted fracture. The cervical vertebral bodies are intact, straightened cervical lordosis. Minimal grade 1 C2-3 anterolisthesis is unchanged. Moderate to severe C2-3 through C6-7 disc height loss is similar, with decreased T2 signal within this consistent with moderate desiccation, superimposed faint bright STIR signal within the C3-4 and the C6-7 discs likely representing edema. Moderate subacute on chronic discogenic endplate changes C3-4 thru C6-7. Acute C4 inferior endplate probable Schmorl's node. No suspicious osseous or intradiscal enhancement. Approximate 3.3 cm segment of cervical spinal cord myelomalacia from C3-4 through C5-6, predominately involving the posterior columns. No syrinx or cord edema. No abnormal cord, leptomeningeal or epidural enhancement. Included prevertebral and paraspinal cyst soft tissues are nonsuspicious, mild paraspinal muscle atrophy, likely postoperative. Level by level evaluation: C2-3: Small broad-based disc bulge, moderate to severe facet arthropathy, ligamentum flavum redundancy result in mild canal stenosis, moderate to severe RIGHT neural foraminal narrowing. C3-4, C4-5, C5-6 and C6-7: Status post posterior decompression, no canal stenosis. Hardware artifact limits evaluation for neural foraminal narrowing, though there is suspected at least moderate neural foraminal narrowing from C3-4 through C5-6. C7-T1: Small broad-based disc bulge, uncovertebral hypertrophy, facet arthropathy. Probable C7 spinous process fracture with small amount of dorsal epidural hematoma. Moderate to severe canal stenosis without definite neural foraminal narrowing. IMPRESSION: Probable acute C7 spinous process fracture, resulting in small amount of dorsal epidural hematoma at C7-T1. This could be confirmed on CT of the cervical spine as clinically  indicated. Status post C3 through C6 posterior decompression and instrumentation. Approximate 3.3 cm segment of cervical spinal cord myelomalacia from C3-4 through C5-6, no findings of acute cord injury. Straightened cervical lordosis without malalignment. Moderate to severe canal stenosis at C7-T1, mild at C2-3. Neural foraminal narrowing at nearly all cervical levels, moderate to severe at C2-3 and C6-7 ; limited assessment at the levels of posterior instrumentation due to hardware artifact. Electronically Signed   By: Awilda Metroourtnay  Bloomer M.D.   On: 04/14/2015 22:12    Review of Systems  Constitutional: Negative.   Eyes: Negative.   Respiratory: Negative.   Cardiovascular: Negative.   Gastrointestinal: Negative.   Genitourinary: Negative.   Musculoskeletal: Positive for myalgias and neck pain.  Skin: Negative.   Neurological: Positive for tingling, sensory change and focal weakness.    Blood pressure 147/105, pulse 103, temperature 98.6 F (37 C), temperature source Oral, resp. rate 18, height 6\' 1"  (1.854 m), weight 78.654 kg (173 lb 6.4 oz), SpO2 100 %. Physical Exam  Constitutional: He is oriented to person, place, and time. He appears well-developed and well-nourished.  HENT:  Head: Normocephalic.  Eyes: Pupils are equal, round, and reactive to light.  Neck: Normal range of motion.  GI: Soft. Bowel sounds are normal.  Neurological: He is alert and oriented to person, place, and time. He has normal strength. GCS eye subscore is  4. GCS verbal subscore is 5. GCS motor subscore is 6.  Strength is 5 out of 5 in his deltoids bilaterally triceps are 4 out of 5 biceps of 4+ out of 5 wrist flexion and extension grip and intrinsics are 4 minus out of 5     Assessment/Plan 59 year gentleman presents for decompression fusion C7-T1  Lenoir Facchini P 04/15/2015, 4:05 PM

## 2015-04-15 NOTE — Op Note (Signed)
Preoperative diagnosis: C7 spinous process fracture with ligamentous disruption and fracture dislocation at C7-T1 with epidural hematoma and spinal cord compression.  Postoperative diagnosis: Same  Procedure: Decompressive cervical thoracic laminectomy at C7 and T1 with foraminotomies of the C8 and T1 nerve roots.  #2 exploration of fusion removal of hardware C6-C7 by cutting the rod at C5-6 with disconnection removal of the nuts at C6-7 and removal of the C7 lateral mass screws  #3 placement of bilateral C7, and T1 pedicle screws under fluoroscopic guidance and fixation to the remaining C6 lateral mass screw utilizing the globus ellipse lateral mass system with 3.5 x 20 mm screws at C7 and 40 by 22 mm at T1. Posterior lateral arthrodesis C6-T1 utilizing BMP locally harvested autograft mixed with allostem morsels  Surgeon: Jillyn HiddenGary Essie Gehret  Asst.: Lisbeth RenshawNeelesh Nundkumar  Anesthesia: Gen.  EBL: 600  History of present illness: Patient is 59 year old gentleman who a few days ago fell hurting his neck experiencing numbness tingling in his arms and hands that isn't progressing over the last several weeks and months. There was a little bit worse after the event patient went to the emergency was worked up underwent MRI scan and subsequent on follow-up also had a CT scan of his neck which showed fracture of the C6 spinous process with splaying of the interspinous ligament small epidural hematoma not causing cervical stenosis but minimal cord compression and instability. Patient was then recommended urgent decompression and stabilization at C7-T1 extensively reviewed the risks and benefits of the procedure the patient as well as perioperative course expectations of outcome and alternatives of surgery and he understands and agrees to proceed forward.  Operative procedure: Patient brought into the or was induced under general anesthesia positioned prone in pins back side of his head neck was prepped and draped in  routine sterile fashion is old incision was opened up and extended inferiorly subperiosteal dissections care on the fracture lamina of C7 which was hypermobile and the lamina and spinal laminar complexes at T1. The hardware was identified and exposed up to the C5 lateral mass screw. Then the spinous process was removed at C7-T1 there was tremendous disruption of the interspinous ligament at this level central decompression was begun by drilling down and thinning out the laminae that with a 2 mm Kerrison punch marching up the lateral gutters and then removing large central bone there is tremendous amount spinal cord compression from the laminar complexes small amount of epidural hematoma this is all teased away decompressing the spinal cord. I identified a C8 nerve root and it is C8 foraminotomies as well as a T1 foraminotomy identified the C7 and T1 pedicles. Then under fluoroscopic guidance I drilled pilot holes using AP and lateral fluoroscopy and the pedicles of C7 and T1 and directly inspected medial border of the pedicle wall we drilled 18 mm holes In the cortex and put 3.5 x 18 on the left 20 on the right at C7 and 22 x 4.0 at T1. Then the wound scope see her get meticulous hemostasis was maintained aggressive decortication was care on the on the lateral masses and the spinolaminar complex and T1 then the BMP and local autograft mixed was then packed after aggressive decortication the rods were then connected top tightness were anchored in place a drain was placed Gelfoam was laid top of the dura and the wounds were closed with interrupted Vicryl and a running 4 screws subcuticular in the skin and Dermabond benzo and Steri-Strips were applied patient recovered in  stable condition. At the end of case and needle counts sponge counts were correct.

## 2015-04-15 NOTE — Plan of Care (Signed)
Problem: Consults Goal: Diagnosis - Spinal Surgery Outcome: Completed/Met Date Met:  04/15/15 Cervical Spine Fusion

## 2015-04-15 NOTE — Discharge Instructions (Signed)
Pain medications as prescribed as needed. Prednisone as prescribed for nerve pain. Follow-up with Dr. Glee ArvinKram tomorrow. Keep neck brace on at all times.   Cervical Radiculopathy Cervical radiculopathy happens when a nerve in the neck (cervical nerve) is pinched or bruised. This condition can develop because of an injury or as part of the normal aging process. Pressure on the cervical nerves can cause pain or numbness that runs from the neck all the way down into the arm and fingers. Usually, this condition gets better with rest. Treatment may be needed if the condition does not improve.  CAUSES This condition may be caused by:  Injury.  Slipped (herniated) disk.  Muscle tightness in the neck because of overuse.  Arthritis.  Breakdown or degeneration in the bones and joints of the spine (spondylosis) due to aging.  Bone spurs that may develop near the cervical nerves. SYMPTOMS Symptoms of this condition include:  Pain that runs from the neck to the arm and hand. The pain can be severe or irritating. It may be worse when the neck is moved.  Numbness or weakness in the affected arm and hand. DIAGNOSIS This condition may be diagnosed based on symptoms, medical history, and a physical exam. You may also have tests, including:  X-rays.  CT scan.  MRI.  Electromyogram (EMG).  Nerve conduction tests. TREATMENT In many cases, treatment is not needed for this condition. With rest, the condition usually gets better over time. If treatment is needed, options may include:  Wearing a soft neck collar for short periods of time.  Physical therapy to strengthen your neck muscles.  Medicines, such as NSAIDs, oral corticosteroids, or spinal injections.  Surgery. This may be needed if other treatments do not help. Various types of surgery may be done depending on the cause of your problems. HOME CARE INSTRUCTIONS Managing Pain  Take over-the-counter and prescription medicines only as told  by your health care provider.  If directed, apply ice to the affected area.  Put ice in a plastic bag.  Place a towel between your skin and the bag.  Leave the ice on for 20 minutes, 2-3 times per day.  If ice does not help, you can try using heat. Take a warm shower or warm bath, or use a heat pack as told by your health care provider.  Try a gentle neck and shoulder massage to help relieve symptoms. Activity  Rest as needed. Follow instructions from your health care provider about any restrictions on activities.  Do stretching and strengthening exercises as told by your health care provider or physical therapist. General Instructions  If you were given a soft collar, wear it as told by your health care provider.  Use a flat pillow when you sleep.  Keep all follow-up visits as told by your health care provider. This is important. SEEK MEDICAL CARE IF:  Your condition does not improve with treatment. SEEK IMMEDIATE MEDICAL CARE IF:  Your pain gets much worse and cannot be controlled with medicines.  You have weakness or numbness in your hand, arm, face, or leg.  You have a high fever.  You have a stiff, rigid neck.  You lose control of your bowels or your bladder (have incontinence).  You have trouble with walking, balance, or speaking.   This information is not intended to replace advice given to you by your health care provider. Make sure you discuss any questions you have with your health care provider.   Document Released: 03/14/2001 Document  Revised: 03/10/2015 Document Reviewed: 08/13/2014 Elsevier Interactive Patient Education 2016 [employer]  Rib Contusion A rib contusion is a deep bruise on your rib area. Contusions are the result of a blunt trauma that causes bleeding and injury to the tissues under the skin. A rib contusion may involve bruising of the ribs and of the skin and muscles in the area. The skin overlying the contusion may turn blue, purple, or  yellow. Minor injuries will give you a painless contusion, but more severe contusions may stay painful and swollen for a few weeks. CAUSES  A contusion is usually caused by a blow, trauma, or direct force to an area of the body. This often occurs while playing contact sports. SYMPTOMS  Swelling and redness of the injured area.  Discoloration of the injured area.  Tenderness and soreness of the injured area.  Pain with or without movement. DIAGNOSIS  The diagnosis can be made by taking a medical history and performing a physical exam. An X-ray, CT scan, or MRI may be needed to determine if there were any associated injuries, such as broken bones (fractures) or internal injuries. TREATMENT  Often, the best treatment for a rib contusion is rest. Icing or applying cold compresses to the injured area may help reduce swelling and inflammation. Deep breathing exercises may be recommended to reduce the risk of partial lung collapse and pneumonia. Over-the-counter or prescription medicines may also be recommended for pain control. HOME CARE INSTRUCTIONS   Apply ice to the injured area:  Put ice in a plastic bag.  Place a towel between your skin and the bag.  Leave the ice on for 20 minutes, 2-3 times per day.  Take medicines only as directed by your health care provider.  Rest the injured area. Avoid strenuous activity and any activities or movements that cause pain. Be careful during activities and avoid bumping the injured area.  Perform deep-breathing exercises as directed by your health care provider.  Do not lift anything that is heavier than 5 lb (2.3 kg) until your health care provider approves.  Do not use any tobacco products, including cigarettes, chewing tobacco, or electronic cigarettes. If you need help quitting, ask your health care provider. SEEK MEDICAL CARE IF:   You have increased bruising or swelling.  You have pain that is not controlled with treatment.  You have a  fever. SEEK IMMEDIATE MEDICAL CARE IF:   You have difficulty breathing or shortness of breath.  You develop a continual cough, or you cough up thick or bloody sputum.  You feel sick to your stomach (nauseous), you throw up (vomit), or you have abdominal pain.   This information is not intended to replace advice given to you by your health care provider. Make sure you discuss any questions you have with your health care provider.   Document Released: 03/14/2001 Document Revised: 07/10/2014 Document Reviewed: 03/31/2014 Elsevier Interactive Patient Education [employer]

## 2015-04-15 NOTE — Transfer of Care (Signed)
Immediate Anesthesia Transfer of Care Note  Patient: Gary Davis  Procedure(s) Performed: Procedure(s): Posterior cervical fusion C6-T1 with instrumentation and arthrodesis, decompressive laminectomy C7-T1, Foraminotomy C8-T1 (N/A)  Patient Location: PACU  Anesthesia Type:General  Level of Consciousness: awake, alert  and oriented  Airway & Oxygen Therapy: Patient Spontanous Breathing and Patient connected to face mask oxygen  Post-op Assessment: Report given to RN and Post -op Vital signs reviewed and stable  Post vital signs: Reviewed and stable  Last Vitals:  Filed Vitals:   04/15/15 1131  BP: 147/105  Pulse: 103  Temp: 37 C  Resp: 18    Complications: No apparent anesthesia complications

## 2015-04-15 NOTE — Anesthesia Preprocedure Evaluation (Addendum)
Anesthesia Evaluation  Patient identified by MRN, date of birth, ID band Patient awake    Reviewed: Allergy & Precautions, NPO status , Patient's Chart, lab work & pertinent test results  History of Anesthesia Complications Negative for: history of anesthetic complications  Airway Mallampati: III  TM Distance: >3 FB Neck ROM: Limited   Comment: C-collar Dental no notable dental hx. (+) Teeth Intact, Dental Advisory Given   Pulmonary Current Smoker,    Pulmonary exam normal breath sounds clear to auscultation       Cardiovascular (-) hypertension(-) anginanegative cardio ROS Normal cardiovascular exam Rhythm:Regular Rate:Normal     Neuro/Psych S/p C3-7 ACDF; residual numbness, weakness in BUE  C-spine not cleared negative psych ROS   GI/Hepatic Neg liver ROS, GERD  Medicated and Controlled,  Endo/Other  negative endocrine ROS  Renal/GU negative Renal ROS     Musculoskeletal  (+) Arthritis , Osteoarthritis,    Abdominal   Peds  Hematology negative hematology ROS (+)   Anesthesia Other Findings Day of surgery medications reviewed with the patient.  Reproductive/Obstetrics                           Anesthesia Physical Anesthesia Plan  ASA: II  Anesthesia Plan: General   Post-op Pain Management:    Induction: Intravenous  Airway Management Planned: Oral ETT and Video Laryngoscope Planned  Additional Equipment:   Intra-op Plan:   Post-operative Plan: Extubation in OR  Informed Consent: I have reviewed the patients History and Physical, chart, labs and discussed the procedure including the risks, benefits and alternatives for the proposed anesthesia with the patient or authorized representative who has indicated his/her understanding and acceptance.   Dental advisory given  Plan Discussed with: CRNA  Anesthesia Plan Comments: (Risks/benefits of general anesthesia discussed with  patient including risk of damage to teeth, lips, gum, and tongue, nausea/vomiting, allergic reactions to medications, and the possibility of heart attack, stroke and death.  All patient questions answered.  Patient wishes to proceed.)       Anesthesia Quick Evaluation

## 2015-04-16 ENCOUNTER — Encounter (HOSPITAL_COMMUNITY): Payer: Self-pay | Admitting: Neurosurgery

## 2015-04-16 MED ORDER — OXYCODONE-ACETAMINOPHEN 5-325 MG PO TABS: 4.0000 | ORAL_TABLET | ORAL | Status: DC | PRN

## 2015-04-16 MED ORDER — SENNA 8.6 MG PO TABS
1.0000 | ORAL_TABLET | Freq: Two times a day (BID) | ORAL | Status: DC
  Administered 2015-04-16 – 2015-04-19 (×7): 8.6 mg via ORAL
  Filled 2015-04-16 (×8): qty 1

## 2015-04-16 MED ORDER — OXYCODONE HCL 5 MG PO TABS
20.0000 mg | ORAL_TABLET | ORAL | Status: DC | PRN
  Administered 2015-04-16 – 2015-04-17 (×9): 20 mg via ORAL
  Filled 2015-04-16 (×9): qty 4

## 2015-04-16 NOTE — Progress Notes (Signed)
Subjective: Patient reports Patient doing well condition neck pain improved feeling and movement in the right hand unchanged left hand no worsening of his symptoms.  Objective: Vital signs in last 24 hours: Temp:  [97.9 F (36.6 C)-99.5 F (37.5 C)] 98.6 F (37 C) (10/14 0522) Pulse Rate:  [60-103] 85 (10/14 0522) Resp:  [13-20] 18 (10/14 0522) BP: (107-147)/(64-105) 107/64 mmHg (10/14 0522) SpO2:  [97 %-100 %] 98 % (10/14 0522) Weight:  [78.654 kg (173 lb 6.4 oz)-81.8 kg (180 lb 5.4 oz)] 81.8 kg (180 lb 5.4 oz) (10/13 2224)  Intake/Output from previous day: 10/13 0701 - 10/14 0700 In: 3360 [P.O.:360; I.V.:3000] Out: 6205 [Urine:5555; Drains:50; Blood:600] Intake/Output this shift:    Neurologically has baseline significant weakness bilateral upper cavity is mostly distal function in the hand and wrist and triceps  Lab Results:  Recent Labs  04/14/15 1845 04/14/15 1904  WBC 6.3  --   HGB 14.0 15.3  HCT 41.0 45.0  PLT 176  --    BMET  Recent Labs  04/14/15 1904  NA 139  K 3.9  CL 102  GLUCOSE 90  BUN 18  CREATININE 0.90    Studies/Results: Dg Ribs Unilateral W/chest Right  04/14/2015  CLINICAL DATA:  Fall last night.  Right rib pain. EXAM: RIGHT RIBS AND CHEST - 3+ VIEW COMPARISON:  01/05/2015 FINDINGS: Old right lateral seventh through tenth rib fractures. No acute fractures visualized. Heart is normal size. Lungs are clear. No effusions or pneumothorax. No acute bony abnormality. IMPRESSION: Old right rib fractures. No acute rib fracture seen. No active cardiopulmonary disease. Electronically Signed   By: Charlett Nose M.D.   On: 04/14/2015 17:34   Dg Cervical Spine 2-3 Views  04/15/2015  CLINICAL DATA:  59 year old male undergoing cervical spine surgery. Initial encounter. EXAM: CERVICAL SPINE  4+ VIEWS COMPARISON:  Cervical spine CT 0022 hours today. FINDINGS: Intraoperative frontal and lateral fluoroscopic views of the lower cervical spine demonstrate  extension of posterior laminar fusion hardware to the T1 level. There may also have been posterior decompression at the cervicothoracic junction. IMPRESSION: Caudal extension of posterior cervical fusion to the cervicothoracic junction. FLUOROSCOPY TIME:  0 minutes 35 seconds Electronically Signed   By: Odessa Fleming M.D.   On: 04/15/2015 19:59   Dg Cervical Spine Complete  04/14/2015  CLINICAL DATA:  Fall last night.  Right neck pain. EXAM: CERVICAL SPINE  4+ VIEWS COMPARISON:  CT 11/04/2014 FINDINGS: Posterior fusion from C3-C7. Degenerative disc disease diffusely throughout the cervical spine. Normal alignment. Prevertebral soft tissues are normal. No fracture. IMPRESSION: Posterior fusion C3-C7.  Spondylosis.  No acute findings. Electronically Signed   By: Charlett Nose M.D.   On: 04/14/2015 17:33   Ct Cervical Spine Wo Contrast  04/15/2015  CLINICAL DATA:  Cervical spine surgery 1 year ago due to arthritis. Numbness in both hands. Patient fell last night and now has neck pain shooting into the left arm. EXAM: CT CERVICAL SPINE WITHOUT CONTRAST TECHNIQUE: Multidetector CT imaging of the cervical spine was performed without intravenous contrast. Multiplanar CT image reconstructions were also generated. COMPARISON:  MRI cervical spine 04/14/2015. Cervical spine radiographs 04/14/2015. CT cervical spine 10/04/2014. FINDINGS: Reversal of the usual cervical lordosis. This may be due to patient positioning but ligamentous injury or muscle spasm could also have this appearance and are not excluded. No anterior subluxation. Postoperative changes with posterior rod and screw fixation from C3 through C7 with posterior laminectomies from C4 through C6. Acute displaced fracture of  the spinous process of C7. No involvement of the pedicles or vertebral body. No vertebral compression deformities. Degenerative changes throughout the cervical spine with intervertebral disc space narrowing and associated endplate hypertrophic  changes. Bridging anterior hypertrophic sore demonstrated. C1-2 articulation appears intact. IMPRESSION: Acute displaced fracture of the spinous process of C7. Nonspecific reversal of the usual cervical lordosis without anterior subluxation. Postoperative changes with posterior laminectomies from C4 through C6 and posterior fixation from C3 through C7. Electronically Signed   By: Burman NievesWilliam  Stevens M.D.   On: 04/15/2015 00:25   Mr Cervical Spine W Wo Contrast  04/14/2015  CLINICAL DATA:  Fall resulting in rib injury last night. Sharp LEFT shoulder and arm pain, bilateral hand weakness. History of posterior cervical fusion, cord compression syndrome/myelopathy, alcohol abuse. EXAM: MRI CERVICAL SPINE WITHOUT AND WITH CONTRAST TECHNIQUE: Multiplanar and multiecho pulse sequences of the cervical spine, to include the craniocervical junction and cervicothoracic junction, were obtained according to standard protocol without and with intravenous contrast. CONTRAST:  18mL MULTIHANCE GADOBENATE DIMEGLUMINE 529 MG/ML IV SOLN COMPARISON:  Cervical spine radiographs April 14, 2015 and MRI of the cervical spine February 04, 2014 FINDINGS: The patient is status post interval C3 undersurface laminotomy, C4, C5, C6 laminectomies, posterior instrumentation resultant susceptibility artifact. Abnormal bright STIR signal within the C7 spinous process is concerning for acute mildly distracted fracture. The cervical vertebral bodies are intact, straightened cervical lordosis. Minimal grade 1 C2-3 anterolisthesis is unchanged. Moderate to severe C2-3 through C6-7 disc height loss is similar, with decreased T2 signal within this consistent with moderate desiccation, superimposed faint bright STIR signal within the C3-4 and the C6-7 discs likely representing edema. Moderate subacute on chronic discogenic endplate changes C3-4 thru C6-7. Acute C4 inferior endplate probable Schmorl's node. No suspicious osseous or intradiscal enhancement.  Approximate 3.3 cm segment of cervical spinal cord myelomalacia from C3-4 through C5-6, predominately involving the posterior columns. No syrinx or cord edema. No abnormal cord, leptomeningeal or epidural enhancement. Included prevertebral and paraspinal cyst soft tissues are nonsuspicious, mild paraspinal muscle atrophy, likely postoperative. Level by level evaluation: C2-3: Small broad-based disc bulge, moderate to severe facet arthropathy, ligamentum flavum redundancy result in mild canal stenosis, moderate to severe RIGHT neural foraminal narrowing. C3-4, C4-5, C5-6 and C6-7: Status post posterior decompression, no canal stenosis. Hardware artifact limits evaluation for neural foraminal narrowing, though there is suspected at least moderate neural foraminal narrowing from C3-4 through C5-6. C7-T1: Small broad-based disc bulge, uncovertebral hypertrophy, facet arthropathy. Probable C7 spinous process fracture with small amount of dorsal epidural hematoma. Moderate to severe canal stenosis without definite neural foraminal narrowing. IMPRESSION: Probable acute C7 spinous process fracture, resulting in small amount of dorsal epidural hematoma at C7-T1. This could be confirmed on CT of the cervical spine as clinically indicated. Status post C3 through C6 posterior decompression and instrumentation. Approximate 3.3 cm segment of cervical spinal cord myelomalacia from C3-4 through C5-6, no findings of acute cord injury. Straightened cervical lordosis without malalignment. Moderate to severe canal stenosis at C7-T1, mild at C2-3. Neural foraminal narrowing at nearly all cervical levels, moderate to severe at C2-3 and C6-7 ; limited assessment at the levels of posterior instrumentation due to hardware artifact. Electronically Signed   By: Awilda Metroourtnay  Bloomer M.D.   On: 04/14/2015 22:12   Dg C-arm 61-120 Min  04/15/2015  CLINICAL DATA:  59 year old male undergoing cervical spine surgery. Initial encounter. EXAM:  CERVICAL SPINE  4+ VIEWS COMPARISON:  Cervical spine CT 0022 hours today. FINDINGS: Intraoperative frontal  and lateral fluoroscopic views of the lower cervical spine demonstrate extension of posterior laminar fusion hardware to the T1 level. There may also have been posterior decompression at the cervicothoracic junction. IMPRESSION: Caudal extension of posterior cervical fusion to the cervicothoracic junction. FLUOROSCOPY TIME:  0 minutes 35 seconds Electronically Signed   By: Odessa Fleming M.D.   On: 04/15/2015 19:59    Assessment/Plan: Mobilized today with physical and occupational therapy  LOS: 1 day     Gary Davis P 04/16/2015, 7:30 AM

## 2015-04-16 NOTE — Evaluation (Signed)
Physical Therapy Evaluation Patient Details Name: ERVING SASSANO MRN: [id number] DOB: 08-Dec-1955 Today's Date: 04/16/2015   History of Present Illness  Pt admitted after a fall in which he sustained a spinous fx C7-T1, the level below his prior fusion (C3-7) in August of 2015.  Pt underwent a posterior cervical fusion and decompression C6-T1.  Pt with hx of R knee arthritis with plans for a TKA soon.  Clinical Impression  Pt was seen for assessment of his initial mobility with pt needing a more assistive walking device than previous SPC.  He is not willing to consider a RW but states his pain in L shoulder prohibits.  Will try hemiwalker tomorrow to see what this offers.      Follow Up Recommendations SNF    Equipment Recommendations  None recommended by PT (await disposition from SNF)    Recommendations for Other Services       Precautions / Restrictions Precautions Precautions: Cervical;Fall Required Braces or Orthoses: Cervical Brace Cervical Brace: Hard collar;At all times Restrictions Weight Bearing Restrictions: No      Mobility  Bed Mobility Overal bed mobility: Needs Assistance Bed Mobility: Rolling;Sidelying to Sit Rolling: Supervision Sidelying to sit: Min guard;Min assist       General bed mobility comments: used log roll technique, HOB up 25 degrees  Transfers Overall transfer level: Needs assistance Equipment used: 1 person hand held assist Transfers: Sit to/from [employer] Sit to Stand: Min assist;Min guard;From elevated surface (wtih IV pole to steady on initial standing) Stand pivot transfers: Min assist (with IV pole)       General transfer comment: pt needs reminders for hand placement  Ambulation/Gait Ambulation/Gait assistance: Min assist;Min guard Ambulation Distance (Feet): 14 Feet Assistive device: 1 person hand held assist (IV pole) Gait Pattern/deviations: Step-to pattern;Wide base of support;Decreased dorsiflexion -  right;Decreased dorsiflexion - left Gait velocity: reduced Gait velocity interpretation: Below normal speed for age/gender General Gait Details: pt is slightly uncoordinated in his steps with IV pole to control balance  Stairs            Wheelchair Mobility    Modified Rankin (Stroke Patients Only)       Balance Overall balance assessment: Needs assistance Sitting-balance support: Feet supported Sitting balance-Leahy Scale: Good     Standing balance support: Single extremity supported Standing balance-Leahy Scale: Fair Standing balance comment: fair- dynamic balance                             Pertinent Vitals/Pain Pain Assessment: No/denies pain    Home Living Family/patient expects to be discharged to:: Private residence Living Arrangements: Non-relatives/Friends (roommate) Available Help at Discharge: Available PRN/intermittently;Friend(s) (roommate works 3 hours a day) Type of Home: Apartment Home Access: Level entry     Home Layout: One level Home Equipment: Cane - single point;Tub bench      Prior Function Level of Independence: Independent with assistive device(s)         Comments: uses cane for ambulation, tub bench for bathing, uses [employer] for transportation, roommate helps with laundry, housekeeping.     Hand Dominance   Dominant Hand: Right    Extremity/Trunk Assessment   Upper Extremity Assessment: RUE deficits/detail RUE Deficits / Details: Longstanding numbness from mid forarm through hand, reports hand being flexed upon awakening requiring him to stretch them prior to use     LUE Deficits / Details: Longstanding numbness from mid forearm through hand, shooting  pain from neck/shoulder through arm with reaching also longstanding    Lower Extremity Assessment: Generalized weakness (RLE 4- to 4 strength, LLE 4 to 4+)      Cervical / Trunk Assessment: Other exceptions (new cervical fusion to T1)  Communication    Communication: No difficulties  Cognition Arousal/Alertness: Awake/alert Behavior During Therapy: WFL for tasks assessed/performed Overall Cognitive Status: Within Functional Limits for tasks assessed                      General Comments General comments (skin integrity, edema, etc.): Pt is demonstrating some instability that makes an assistive device more than a SPC needed.  Refuses to use RW but have asked pt to consider using a hemiwalker for more support.  Will try tomorrow.    Exercises        Assessment/Plan    PT Assessment Patient needs continued PT services  PT Diagnosis Difficulty walking;Generalized weakness   PT Problem List Decreased strength;Decreased range of motion;Decreased activity tolerance;Decreased balance;Decreased mobility;Decreased coordination;Decreased cognition;Decreased knowledge of use of DME;Decreased safety awareness;Decreased knowledge of precautions;Cardiopulmonary status limiting activity;Decreased skin integrity  PT Treatment Interventions DME instruction;Gait training;Functional mobility training;Therapeutic activities;Therapeutic exercise;Balance training;Neuromuscular re-education;Patient/family education   PT Goals (Current goals can be found in the Care Plan section) Acute Rehab PT Goals Patient Stated Goal: post acute rehab prior to return home with roommate PT Goal Formulation: With patient Time For Goal Achievement: 04/30/15 Potential to Achieve Goals: Good    Frequency Min 5X/week   Barriers to discharge Decreased caregiver support      Co-evaluation PT/OT/SLP Co-Evaluation/Treatment: Yes Reason for Co-Treatment: For patient/therapist safety PT goals addressed during session: Mobility/safety with mobility;Proper use of DME OT goals addressed during session: ADL's and self-care       End of Session Equipment Utilized During Treatment: Gait belt Activity Tolerance: Patient tolerated treatment well;Patient limited by  fatigue Patient left: in chair;with call bell/phone within reach Nurse Communication: Mobility status;Precautions         Time: 1202-1232 PT Time Calculation (min) (ACUTE ONLY): 30 min   Charges:   PT Evaluation $Initial PT Evaluation Tier I: 1 Procedure PT Treatments $Gait Training: 8-22 mins   PT G CodesIvar Drape:        Reinhold Rickey E 04/16/2015, 5:37 PM   Samul Dadauth Truong Delcastillo, PT MS Acute Rehab Dept. Number: [facility] [id number] [phone] and [facility] [phone]

## 2015-04-16 NOTE — Evaluation (Signed)
Occupational Therapy Evaluation Patient Details Name: Gary RidingMichael D Chopp MRN: [id number] DOB: 02/27/1956 Today's Date: 04/16/2015    History of Present Illness Pt admitted after a fall in which he sustained a spinous fx C7-T1, the level below his prior fusion (C3-7) in August of 2015.  Pt underwent a posterior cervical fusion and decompression C6-T1.  Pt with hx of R knee arthritis with plans for a TKA soon.   Clinical Impression   Pt was functioning at a modified independent level prior to admission. He presents with impaired sensation, strength and coordination in his B UEs and decreased balance interfering with ability to perform ADL and ADL transfers at his baseline.  Pt is resistant to walker use due to it creating more L shoulder and UE pain. Will follow acutely. Pt will benefit from ST rehab prior to return home.    Follow Up Recommendations  SNF    Equipment Recommendations       Recommendations for Other Services       Precautions / Restrictions Precautions Precautions: Cervical;Fall Required Braces or Orthoses: Cervical Brace Cervical Brace: Hard collar      Mobility Bed Mobility Overal bed mobility: Needs Assistance Bed Mobility: Rolling;Sidelying to Sit Rolling: Supervision Sidelying to sit: Supervision       General bed mobility comments: used log roll technique, HOB up 25 degrees  Transfers Overall transfer level: Needs assistance Equipment used:  (IV pole) Transfers: Sit to/from Stand Sit to Stand: Min guard              Balance Overall balance assessment: Needs assistance   Sitting balance-Leahy Scale: Good       Standing balance-Leahy Scale: Fair                              ADL Overall ADL's : Needs assistance/impaired Eating/Feeding: Independent;Sitting Eating/Feeding Details (indicate cue type and reason): Pt able to use mouth to open containers.  Grooming: Min guard;Sitting   Upper Body Bathing: Minimal  assitance;Sitting   Lower Body Bathing: Minimal assistance;Sit to/from stand   Upper Body Dressing : Minimal assistance;Sitting   Lower Body Dressing: Minimal assistance;Sit to/from stand   Toilet Transfer: Min guard;Ambulation   Toileting- Clothing Manipulation and Hygiene: Min guard;Sit to/from stand       Functional mobility during ADLs: Min guard (used IV pole) General ADL Comments: Pt is aware of cervical precautions from previous surgery.  Pt wants post acute rehab prior to return home.     Vision     Perception     Praxis      Pertinent Vitals/Pain Pain Assessment: No/denies pain     Hand Dominance Right   Extremity/Trunk Assessment Upper Extremity Assessment Upper Extremity Assessment: RUE deficits/detail;LUE deficits/detail RUE Deficits / Details: Longstanding numbness from mid forarm through hand, reports hand being flexed upon awakening requiring him to stretch them prior to use RUE Coordination: decreased fine motor LUE Deficits / Details: Longstanding numbness from mid forearm through hand, shooting pain from neck/shoulder through arm with reaching also longstanding  LUE Coordination: decreased fine motor;decreased gross motor   Lower Extremity Assessment Lower Extremity Assessment: Defer to PT evaluation       Communication Communication Communication: No difficulties   Cognition Arousal/Alertness: Awake/alert Behavior During Therapy: WFL for tasks assessed/performed Overall Cognitive Status: Within Functional Limits for tasks assessed  General Comments       Exercises       Shoulder Instructions      Home Living Family/patient expects to be discharged to:: Private residence Living Arrangements: Non-relatives/Friends (roommate) Available Help at Discharge: Available PRN/intermittently;Friend(s) (roommate works 3 hours a day, otherwise is there) Type of Home: Apartment Home Access: Level entry     Home Layout:  One level     Bathroom Shower/Tub: Chief Strategy Officer: Handicapped height     Home Equipment: Cane - single point;Tub bench          Prior Functioning/Environment Level of Independence: Independent with assistive device(s)        Comments: uses cane for ambulation, tub bench for bathing, uses [employer] for transportation, roommate helps with laundry, housekeeping.    OT Diagnosis: Generalized weakness;Acute pain   OT Problem List: Decreased strength;Decreased activity tolerance;Impaired balance (sitting and/or standing);Decreased coordination;Decreased knowledge of use of DME or AE;Impaired sensation;Impaired UE functional use   OT Treatment/Interventions: Self-care/ADL training;DME and/or AE instruction;Patient/family education;Balance training    OT Goals(Current goals can be found in the care plan section) Acute Rehab OT Goals Patient Stated Goal: post acute rehab prior to return home with roommate OT Goal Formulation: With patient Time For Goal Achievement: 04/23/15 Potential to Achieve Goals: Good ADL Goals Pt Will Perform Grooming: with modified independence;standing Pt Will Perform Upper Body Bathing: with modified independence;sitting;with adaptive equipment Pt Will Perform Lower Body Bathing: with modified independence;sit to/from stand Pt Will Perform Upper Body Dressing: with modified independence;sitting Pt Will Perform Lower Body Dressing: with modified independence;sit to/from stand Pt Will Transfer to Toilet: with modified independence;ambulating Pt Will Perform Toileting - Clothing Manipulation and hygiene: with modified independence;sit to/from stand Pt Will Perform Tub/Shower Transfer: Tub transfer;ambulating;tub bench  OT Frequency: Min 2X/week   Barriers to D/C:            Co-evaluation   Reason for Co-Treatment: For patient/therapist safety   OT goals addressed during session: ADL's and self-care      End of Session Equipment  Utilized During Treatment: Cervical collar  Activity Tolerance: Patient tolerated treatment well Patient left: in chair;with call bell/phone within reach   Time: 7829-5621 OT Time Calculation (min): 27 min Charges:  OT General Charges $OT Visit: 1 Procedure OT Evaluation $Initial OT Evaluation Tier I: 1 Procedure G-Codes:    Evern Bio 04/16/2015, 1:16 PM [phone]

## 2015-04-16 NOTE — Clinical Social Work Note (Signed)
CSW Consult Acknowledged:   CSW received a consult for SNF placement. CSW awaiting PT/OT evaluation to determine the appropriate level of care.      Sekai Gitlin, MSW, LCSWA [phone]  

## 2015-04-16 NOTE — Anesthesia Postprocedure Evaluation (Signed)
  Anesthesia Post-op Note  Patient: Gary FellsMichael D Chiquito  Procedure(s) Performed: Procedure(s): Posterior cervical fusion C6-T1 with instrumentation and arthrodesis, decompressive laminectomy C7-T1, Foraminotomy C8-T1 (N/A)  Patient Location: PACU  Anesthesia Type:General  Level of Consciousness: awake  Airway and Oxygen Therapy: Patient Spontanous Breathing  Post-op Pain: mild  Post-op Assessment: Post-op Vital signs reviewed, Patient's Cardiovascular Status Stable, Respiratory Function Stable, Patent Airway, No signs of Nausea or vomiting and Pain level controlled LLE Motor Response: Purposeful movement LLE Sensation: Full sensation RLE Motor Response: Purposeful movement RLE Sensation: Full sensation      Post-op Vital Signs: Reviewed and stable  Last Vitals:  Filed Vitals:   04/16/15 0522  BP: 107/64  Pulse: 85  Temp: 37 C  Resp: 18    Complications: No apparent anesthesia complications

## 2015-04-16 NOTE — Care Management Note (Signed)
Case Management Note  Patient Details  Name: Gary Davis MRN: [id number] Date of Birth: 03/09/1956  Subjective/Objective:                    Action/Plan: Patient admitted with cervical spine fracture. Pt had a C6-T1 laminectomy/ foraminotomy. Pt lives at home alone. Awaiting PT/OT recommendations for discharge disposition. CM will continue to follow for discharge needs.   Expected Discharge Date:                  Expected Discharge Plan:  Home/Self Care  In-House Referral:     Discharge planning Services     Post Acute Care Choice:    Choice offered to:     DME Arranged:    DME Agency:     HH Arranged:    HH Agency:     Status of Service:  In process, will continue to follow  Medicare Important Message Given:    Date Medicare IM Given:    Medicare IM give by:    Date Additional Medicare IM Given:    Additional Medicare Important Message give by:     If discussed at Long Length of Stay Meetings, dates discussed:    Additional Comments:  Kermit BaloKelli F Maddie Brazier, RN 04/16/2015, 11:04 AM

## 2015-04-17 MED ORDER — OXYCODONE HCL 5 MG PO TABS
30.0000 mg | ORAL_TABLET | ORAL | Status: DC | PRN
  Administered 2015-04-17 – 2015-04-19 (×10): 30 mg via ORAL
  Filled 2015-04-17 (×11): qty 6

## 2015-04-17 NOTE — Progress Notes (Addendum)
Subjective: Patient reports stable  Objective: Vital signs in last 24 hours: Temp:  [97.5 F (36.4 C)-98.7 F (37.1 C)] 97.7 F (36.5 C) (10/15 0541) Pulse Rate:  [78-100] 85 (10/15 0541) Resp:  [18-20] 20 (10/15 0541) BP: (107-137)/(73-93) 137/93 mmHg (10/15 0541) SpO2:  [96 %-100 %] 98 % (10/15 0541)  Intake/Output from previous day: 10/14 0701 - 10/15 0700 In: -  Out: 2500 [Urine:2350; Drains:150] Intake/Output this shift:    Physical Exam: Strength still quite diminished.  No change from yesterday per patient.  Lab Results:  Recent Labs  04/14/15 1845 04/14/15 1904  WBC 6.3  --   HGB 14.0 15.3  HCT 41.0 45.0  PLT 176  --    BMET  Recent Labs  04/14/15 1904  NA 139  K 3.9  CL 102  GLUCOSE 90  BUN 18  CREATININE 0.90    Studies/Results: Dg Cervical Spine 2-3 Views  04/15/2015  CLINICAL DATA:  59 year old male undergoing cervical spine surgery. Initial encounter. EXAM: CERVICAL SPINE  4+ VIEWS COMPARISON:  Cervical spine CT 0022 hours today. FINDINGS: Intraoperative frontal and lateral fluoroscopic views of the lower cervical spine demonstrate extension of posterior laminar fusion hardware to the T1 level. There may also have been posterior decompression at the cervicothoracic junction. IMPRESSION: Caudal extension of posterior cervical fusion to the cervicothoracic junction. FLUOROSCOPY TIME:  0 minutes 35 seconds Electronically Signed   By: Odessa FlemingH  Hall M.D.   On: 04/15/2015 19:59   Dg C-arm 61-120 Min  04/15/2015  CLINICAL DATA:  59 year old male undergoing cervical spine surgery. Initial encounter. EXAM: CERVICAL SPINE  4+ VIEWS COMPARISON:  Cervical spine CT 0022 hours today. FINDINGS: Intraoperative frontal and lateral fluoroscopic views of the lower cervical spine demonstrate extension of posterior laminar fusion hardware to the T1 level. There may also have been posterior decompression at the cervicothoracic junction. IMPRESSION: Caudal extension of  posterior cervical fusion to the cervicothoracic junction. FLUOROSCOPY TIME:  0 minutes 35 seconds Electronically Signed   By: Odessa FlemingH  Hall M.D.   On: 04/15/2015 19:59    Assessment/Plan: Mobilize, PT, Rehab?  Severe central cord injury. Continue drain today.    LOS: 2 days    Dorian HeckleSTERN,Tranisha Tissue D, MD 04/17/2015, 7:15 AM

## 2015-04-18 NOTE — Progress Notes (Signed)
Overall he looks pretty good. He is in his cervical collar. His pain is better controlled today. No change in myelopathy. Continue current management. Possibly home in the next day or 2.

## 2015-04-18 NOTE — Progress Notes (Signed)
Physical Therapy Treatment Patient Details Name: Gary Davis MRN: [id number] DOB: 02-Mar-1956 Today's Date: 04/18/2015    History of Present Illness Pt admitted after a fall in which he sustained a spinous fx C7-T1, the level below his prior fusion (C3-7) in August of 2015.  Pt underwent a posterior cervical fusion and decompression C6-T1.  Pt with hx of R knee arthritis with plans for a TKA soon.    PT Comments    Pt reports feeling better today,ready to go home tomorrow.  Pt nearing baseline level of functional performance.  Able to demonstrate safety and incr gait stability using hemi-walker, recommend device for home use.  Consider HHPT for postacute care. See below for details of session.     Follow Up Recommendations  Home health PT     Equipment Recommendations   (hemi-walker)    Recommendations for Other Services       Precautions / Restrictions Precautions Precautions: Cervical;Fall Required Braces or Orthoses: Cervical Brace Cervical Brace: Hard collar;At all times Restrictions Weight Bearing Restrictions: No    Mobility  Bed Mobility Overal bed mobility: Modified Independent Bed Mobility: Supine to Sit;Sit to Supine     Supine to sit: Modified independent (Device/Increase time) Sit to supine: Modified independent (Device/Increase time)   General bed mobility comments: pt able to transition to/from supine with slight incr time and no physical assistance  Transfers Overall transfer level: Modified independent Equipment used: Hemi-walker Transfers: Sit to/from Stand Sit to Stand: Modified independent (Device/Increase time)         General transfer comment: standby for safety, no obvious instability noted, transitions to hemiwalker without problem  Ambulation/Gait Ambulation/Gait assistance: Min guard Ambulation Distance (Feet): 75 Feet Assistive device: Hemi-walker Gait Pattern/deviations: Step-through pattern;Step-to pattern;Decreased stride  length;Decreased step length - right;Decreased dorsiflexion - left;Decreased dorsiflexion - right;Shuffle;Narrow base of support   Gait velocity interpretation: at or above normal speed for age/gender General Gait Details: demonstrates improved stabiltiy with hemiwalker after few cues for coordination and a height adjustment; able to turn 180 degrees without incr instabilty or need for assist; states incr confidence using HW device and prepared to d/c home tomorrow   Stairs            Wheelchair Mobility    Modified Rankin (Stroke Patients Only)       Balance Overall balance assessment: History of Falls Sitting-balance support: Feet supported;No upper extremity supported Sitting balance-Leahy Scale: Good     Standing balance support: No upper extremity supported;During functional activity;Single extremity supported Standing balance-Leahy Scale: Fair Standing balance comment: dependent on assistive device for ambulation; falls tend to happen forward, pt notes hurrying and catching feet on floor primary causes; educated to incr dorsiflexion with gait                    Cognition Arousal/Alertness: Awake/alert Behavior During Therapy: WFL for tasks assessed/performed Overall Cognitive Status: Within Functional Limits for tasks assessed                      Exercises      General Comments        Pertinent Vitals/Pain Pain Assessment: 0-10 Pain Score: 4  Pain Intervention(s): Monitored during session;Limited activity within patient's tolerance    Home Living                      Prior Function            PT Goals (current  goals can now be found in the care plan section) Acute Rehab PT Goals Patient Stated Goal: post acute rehab prior to return home with roommate Progress towards PT goals: Progressing toward goals    Frequency  Min 5X/week    PT Plan Discharge plan needs to be updated    Co-evaluation     PT goals addressed  during session: Mobility/safety with mobility;Proper use of DME       End of Session Equipment Utilized During Treatment: Gait belt Activity Tolerance: Patient tolerated treatment well Patient left: in bed;with call bell/phone within reach     Time: 1103-1130 PT Time Calculation (min) (ACUTE ONLY): 27 min  Charges:  $Gait Training: 8-22 mins $Therapeutic Activity: 8-22 mins                    G Codes:      Dennis BastMartin, Quianna Avery Galloway 04/18/2015, 11:45 AM

## 2015-04-19 MED ORDER — OXYCODONE HCL 30 MG PO TABS: 30.0000 mg | ORAL_TABLET | ORAL | Status: DC | PRN

## 2015-04-19 MED ORDER — CYCLOBENZAPRINE HCL 10 MG PO TABS: 10.0000 mg | ORAL_TABLET | Freq: Three times a day (TID) | ORAL | Status: DC | PRN

## 2015-04-19 NOTE — Discharge Instructions (Signed)
No lifting no driving walking only wear the collar 24 hours 7 days a week may remove the outer dressing in 2-3 days leave the Steri-Strips on and intact cover them up with Saran wrap for showers only.

## 2015-04-19 NOTE — Discharge Summary (Signed)
  Physician Discharge Summary  Patient ID: Gary Davis MRN: [id number] DOB/AGE: 61/12/1955 61 y.o.  Admit date: 04/15/2015 Discharge date: 04/19/2015  Admission Diagnoses: Cervical myelopathy C7 fracture dislocation  Discharge Diagnoses: Same Active Problems:   Cervical spine fracture, initial encounter   Discharged Condition: good  Hospital Course: Patient is admitted to the hospital underwent decompression and fusion from C6-T1 postop patient did fairly well with recovered in the floor on the floor was angling getting rehabilitation in stable for discharge home. She'll be scheduled follow-up in one to 2 weeks.  Consults: Significant Diagnostic Studies: Treatments: Decompression and cervical fusion C6-T1 Discharge Exam: Blood pressure 141/87, pulse 64, temperature 98.3 F (36.8 C), temperature source Oral, resp. rate 18, height 6\' 1"  (1.854 m), weight 81.8 kg (180 lb 5.4 oz), SpO2 98 %. Central cord syndrome significant dysfunction and pain intrinsic weakness an tricep weakness.  Disposition: Home  Discharge Instructions    Cane    Complete by:  As directed             Medication List    TAKE these medications        cyclobenzaprine 10 MG tablet  Commonly known as:  FLEXERIL  TAKE 1 TABLET (10 MG TOTAL) BY MOUTH 3 (THREE) TIMES DAILY AS NEEDED FOR MUSCLE SPASMS.     gabapentin 300 MG capsule  Commonly known as:  NEURONTIN  TAKE 1 CAPSULE (300 MG TOTAL) BY MOUTH 3 (THREE) TIMES DAILY.     meloxicam 15 MG tablet  Commonly known as:  MOBIC  Take 15 mg by mouth as needed for pain.     oxyCODONE 5 MG immediate release tablet  Commonly known as:  ROXICODONE  Take 1 tablet (5 mg total) by mouth every 4 (four) hours as needed for severe pain.     oxycodone 30 MG immediate release tablet  Commonly known as:  ROXICODONE  Take 1 tablet (30 mg total) by mouth every 4 (four) hours as needed for moderate pain.     pantoprazole 40 MG tablet  Commonly known as:   PROTONIX  Take 1 tablet (40 mg total) by mouth daily.     predniSONE 20 MG tablet  Commonly known as:  DELTASONE  Take 2 tablets (40 mg total) by mouth daily.     tamsulosin 0.4 MG Caps capsule  Commonly known as:  FLOMAX  Take 1 capsule (0.4 mg total) by mouth daily.           Follow-up Information    [facility]   Specialty:  Neurosurgery   Contact information:   [address] [place] [address] [phone]       Signed: Mariam DollarCRAM,Jaziya Obarr P 04/19/2015, 11:48 AM

## 2015-04-19 NOTE — Care Management Note (Signed)
Case Management Note  Patient Details  Name: Gary Davis MRN: [id number] Date of Birth: 05/17/1956  Subjective/Objective:                    Action/Plan: Patient is discharging home today. No home health services ordered. Order was placed by MD for a cane. [facility] was notified of need for cane prior to discharge home.  Expected Discharge Date:                  Expected Discharge Plan:  Home/Self Care  In-House Referral:     Discharge planning Services  CM Consult  Post Acute Care Choice:    Choice offered to:  Patient  DME Arranged:  [facility] DME Agency:  [facility]  [facility]     Status of Service:  In process, will continue to follow  Medicare Important Message Given:    Date Medicare IM Given:    Medicare IM give by:    Date Additional Medicare IM Given:    Additional Medicare Important Message give by:     If discussed at Long Length of Stay Meetings, dates discussed:    Additional Comments:  Anda KraftRobarge, Jarriel Papillion C, RN 04/19/2015, 2:02 PM

## 2015-04-19 NOTE — Progress Notes (Signed)
Patient is discharged from room 5C15 at this time. Alert and in stable condition. IV site d/c'd. Instructions read to [patient and understanding verbalized. Left unit via wheelchair with all belongings at side. 

## 2015-04-19 NOTE — Progress Notes (Signed)
Observed PT walking around the unit with a hemi walker mod. Independent. Pt reports he is going home today. Pt has met PT goals of mod. I with device and is ready for d/c home based on clinical observation. Pt did not receive a PT treatment today due to mod. Independent goals reached and no further acute PT indicated.

## 2015-04-19 NOTE — Progress Notes (Signed)
Patient ID: Gary RidingMichael D Yetman, male   DOB: 02/02/1956, 59 y.o.   MRN: [id number]  Patient doing well pain well controlled symptoms essentially unchanged from preop  Significant central cord dysfunction with weakness in his hands and hand intrinsics. Ambulating well  We'll plan discharge home

## 2015-04-21 ENCOUNTER — Encounter (HOSPITAL_COMMUNITY): Payer: Self-pay | Admitting: Neurosurgery

## 2015-05-06 ENCOUNTER — Encounter (HOSPITAL_COMMUNITY): Payer: Self-pay | Admitting: Neurosurgery

## 2015-05-07 ENCOUNTER — Other Ambulatory Visit: Payer: Self-pay | Admitting: *Deleted

## 2015-05-07 MED ORDER — GABAPENTIN 300 MG PO CAPS: ORAL_CAPSULE | ORAL | Status: DC

## 2015-05-11 ENCOUNTER — Encounter (HOSPITAL_COMMUNITY): Payer: Self-pay | Admitting: Neurosurgery

## 2015-05-13 ENCOUNTER — Other Ambulatory Visit: Payer: Self-pay

## 2015-05-13 DIAGNOSIS — M7989 Other specified soft tissue disorders: Secondary | ICD-10-CM

## 2015-05-20 ENCOUNTER — Other Ambulatory Visit: Payer: Self-pay | Admitting: Neurosurgery

## 2015-05-20 ENCOUNTER — Ambulatory Visit (HOSPITAL_COMMUNITY)
Admission: RE | Admit: 2015-05-20 | Discharge: 2015-05-20 | Disposition: A | Discharge status: Home / Self Care | Payer: Self-pay | Source: Ambulatory Visit | Attending: Vascular Surgery | Admitting: Vascular Surgery

## 2015-05-20 DIAGNOSIS — M47812 Spondylosis without myelopathy or radiculopathy, cervical region: Secondary | ICD-10-CM

## 2015-05-20 DIAGNOSIS — R609 Edema, unspecified: Secondary | ICD-10-CM

## 2015-05-21 ENCOUNTER — Ambulatory Visit: Payer: No Typology Code available for payment source | Admitting: Internal Medicine

## 2015-05-26 ENCOUNTER — Other Ambulatory Visit: Payer: Self-pay

## 2015-06-10 ENCOUNTER — Inpatient Hospital Stay: Admission: RE | Admit: 2015-06-10 | Payer: Self-pay | Source: Ambulatory Visit

## 2015-06-14 ENCOUNTER — Emergency Department (HOSPITAL_COMMUNITY): Payer: Medicaid Other

## 2015-06-14 ENCOUNTER — Emergency Department (HOSPITAL_COMMUNITY)
Admission: EM | Admit: 2015-06-14 | Discharge: 2015-06-14 | Disposition: A | Discharge status: Home / Self Care | Payer: Medicaid Other | Attending: Emergency Medicine | Admitting: Emergency Medicine

## 2015-06-14 ENCOUNTER — Encounter (HOSPITAL_COMMUNITY): Payer: Self-pay | Admitting: Emergency Medicine

## 2015-06-14 DIAGNOSIS — Y9301 Activity, walking, marching and hiking: Secondary | ICD-10-CM | POA: Insufficient documentation

## 2015-06-14 DIAGNOSIS — S199XXA Unspecified injury of neck, initial encounter: Secondary | ICD-10-CM | POA: Diagnosis present

## 2015-06-14 DIAGNOSIS — W19XXXA Unspecified fall, initial encounter: Secondary | ICD-10-CM

## 2015-06-14 DIAGNOSIS — W1830XA Fall on same level, unspecified, initial encounter: Secondary | ICD-10-CM | POA: Diagnosis not present

## 2015-06-14 DIAGNOSIS — Y998 Other external cause status: Secondary | ICD-10-CM | POA: Insufficient documentation

## 2015-06-14 DIAGNOSIS — M542 Cervicalgia: Secondary | ICD-10-CM

## 2015-06-14 DIAGNOSIS — Y9289 Other specified places as the place of occurrence of the external cause: Secondary | ICD-10-CM | POA: Insufficient documentation

## 2015-06-14 DIAGNOSIS — F17210 Nicotine dependence, cigarettes, uncomplicated: Secondary | ICD-10-CM | POA: Diagnosis not present

## 2015-06-14 DIAGNOSIS — M1990 Unspecified osteoarthritis, unspecified site: Secondary | ICD-10-CM | POA: Diagnosis not present

## 2015-06-14 MED ORDER — OXYCODONE HCL 5 MG PO TABS
5.0000 mg | ORAL_TABLET | Freq: Once | ORAL | Status: AC
  Administered 2015-06-14: 5 mg via ORAL
  Filled 2015-06-14: qty 1

## 2015-06-14 NOTE — ED Notes (Signed)
PA Cartner at bedside.  

## 2015-06-14 NOTE — ED Notes (Signed)
Patient transported to CT 

## 2015-06-14 NOTE — Discharge Instructions (Signed)
You were evaluated in the ED today for your neck pain after fall. There does not appear to be an emergent cause for your symptoms at this time. Your CT scan of your head and neck were stable and did not show any new or emergent processes. Please follow-up with your doctor for reevaluation in the next 2-3 days. Return to ED for any new or worsening symptoms including headache, vision changes, worsening numbness or weakness.

## 2015-06-14 NOTE — ED Notes (Addendum)
Pt arrives via [facility], pt reports he walks with a cane, tripped today while walking and fell, hitting his head on the wall, no LOC. Pt has hx of cervical spine fractures as well as cervical spine surgery c4-c6. Pt also has hx of weakness in both hands but reports some numbness to left leg that is new. Pt has c collar in place. Also reports he is out of his oxycodone x1 week. Pt alert/oriented x4.

## 2015-06-14 NOTE — ED Provider Notes (Signed)
CSN: [id number]     Arrival date & time 06/14/15  1355 History   First MD Initiated Contact with Patient 06/14/15 1401     Chief Complaint  Patient presents with  . Fall     (Consider location/radiation/quality/duration/timing/severity/associated sxs/prior Treatment) HPI Gary Davis is a 59 y.o. male with history of bilateral knee arthritis, recent cervical spine fusion surgery in October , comes in for evaluation of fall. Patient reports just PTA, he was walking down the hall at his home when his "knee gave out due to my arthritis" and he fell forward landing on the front of his head. He reports he did not have his cane which he typically uses to walk with. He reports feeling "woozy" initially and also reports "pins and needles in my left calf". No other headache, LOC, vision changes, other numbness or weakness. No anticoagulation. Certain movements worsen his discomfort, nothing makes it better. No other modifying factors. Past Medical History  Diagnosis Date  . Arthritis   . Cataract   . Alcohol abuse    Past Surgical History  Procedure Laterality Date  . Arthroscopic repair acl Right   . Orif ankle fracture Right   . Finger fracture surgery      Right 4th Finger  . Posterior cervical fusion/foraminotomy N/A 02/09/2014    Procedure: POSTERIOR CERVICAL FUSION/FORAMINOTOMY LEVEL 4  Cervical three to seven decompression and fusion with lateral mass screws;  Surgeon: Mariam Dollar, MD;  Location: [facility];  Service: Neurosurgery;  Laterality: N/A;  . Back surgery    . Fracture surgery    . Posterior cervical fusion/foraminotomy N/A 04/15/2015    Procedure: Posterior cervical fusion C6-T1 with instrumentation and arthrodesis, decompressive laminectomy C7-T1, Foraminotomy C8-T1;  Surgeon: Donalee Citrin, MD;  Location: [facility];  Service: Neurosurgery;  Laterality: N/A;   Family History  Problem Relation Age of Onset  . Cancer Mother     breast cancer   Social History   Substance Use Topics  . Smoking status: Current Every Day Smoker -- 0.33 packs/day for 15 years    Types: Cigarettes  . Smokeless tobacco: None  . Alcohol Use: 3.6 oz/week    6 Cans of beer per week     Comment: daily    Review of Systems A 10 point review of systems was completed and was negative except for pertinent positives and negatives as mentioned in the history of present illness     Allergies  Percocet and Tramadol  Home Medications   Prior to Admission medications   Medication Sig Start Date End Date Taking? Authorizing Provider  Oxycodone HCl 20 MG TABS Take 1 tablet by mouth every 6 (six) hours as needed (pain).   Yes Historical Provider, MD  cyclobenzaprine (FLEXERIL) 10 MG tablet TAKE 1 TABLET (10 MG TOTAL) BY MOUTH 3 (THREE) TIMES DAILY AS NEEDED FOR MUSCLE SPASMS. Patient not taking: Reported on 06/14/2015 03/25/15   Dessa Phi, MD  cyclobenzaprine (FLEXERIL) 10 MG tablet Take 1 tablet (10 mg total) by mouth 3 (three) times daily as needed for muscle spasms. Patient not taking: Reported on 06/14/2015 04/19/15   Donalee Citrin, MD  gabapentin (NEURONTIN) 300 MG capsule TAKE 1 CAPSULE (300 MG TOTAL) BY MOUTH 3 (THREE) TIMES DAILY. Patient not taking: Reported on 06/14/2015 05/07/15   Dessa Phi, MD  oxyCODONE (ROXICODONE) 30 MG immediate release tablet Take 1 tablet (30 mg total) by mouth every 4 (four) hours as needed for moderate pain. Patient not taking:  Reported on 06/14/2015 04/19/15   Donalee Citrin, MD  oxyCODONE (ROXICODONE) 5 MG immediate release tablet Take 1 tablet (5 mg total) by mouth every 4 (four) hours as needed for severe pain. Patient not taking: Reported on 06/14/2015 04/15/15   Tatyana Kirichenko, PA-C  pantoprazole (PROTONIX) 40 MG tablet Take 1 tablet (40 mg total) by mouth daily. Patient not taking: Reported on 06/14/2015 02/05/15   [name], MD  predniSONE (DELTASONE) 20 MG tablet Take 2 tablets (40 mg total) by mouth daily. Patient not  taking: Reported on 06/14/2015 04/15/15   Jaynie Crumble, PA-C  tamsulosin (FLOMAX) 0.4 MG CAPS capsule Take 1 capsule (0.4 mg total) by mouth daily. Patient not taking: Reported on 06/14/2015 01/05/15   [name], MD   BP 127/86 mmHg  Pulse 61  Temp(Src) 97.9 F (36.6 C) (Oral)  Resp 14  SpO2 99% Physical Exam  Constitutional: He is oriented to person, place, and time. He appears well-developed and well-nourished.  HENT:  Head: Normocephalic and atraumatic.  Mouth/Throat: Oropharynx is clear and moist.  Eyes: Conjunctivae are normal. Pupils are equal, round, and reactive to light. Right eye exhibits no discharge. Left eye exhibits no discharge. No scleral icterus.  Neck: Neck supple.  C-collar in place  Cardiovascular: Normal rate, regular rhythm and normal heart sounds.   Pulmonary/Chest: Effort normal and breath sounds normal. No respiratory distress. He has no wheezes. He has no rales.  Abdominal: Soft. There is no tenderness.  Musculoskeletal: He exhibits no tenderness.  Neurological: He is alert and oriented to person, place, and time.  Cranial Nerves II-XII grossly intact. Moves all extremities without ataxia. Motor strength is baseline. Sensation is intact to light touch. Reports mild paresthesias to medial aspect of left calf.  Skin: Skin is warm and dry. No rash noted.  Psychiatric: He has a normal mood and affect.  Nursing note and vitals reviewed.   ED Course  Procedures (including critical care time) Labs Review Labs Reviewed - No data to display  Imaging Review Dg Cervical Spine Complete  06/14/2015  CLINICAL DATA:  Posterior neck pain after falling today. History of cervical spine fusion, most recently 04/15/2015. EXAM: CERVICAL SPINE - COMPLETE 4+ VIEW COMPARISON:  Radiographs 04/14/2015, cervical spine CT 04/15/2015 and intraoperative radiographs 04/14/2005) FINDINGS: The prevertebral soft tissues are normal. There is stable straightening of the cervical  spine. The patient is status post posterior fusion from C3 through T1. The fusion was extended inferiorly across the C7-T1 disc space 2 months ago. The interconnecting rods were severed at the time of the most recent surgery, discontinuous at C5-6. No apparent hardware loosening or acute osseous findings. IMPRESSION: Stable appearance of the surgical hardware status post C3-T1 fusion. No evidence of acute fracture, traumatic subluxation or static signs of instability. Electronically Signed   By: Carey Bullocks M.D.   On: 06/14/2015 15:35   Ct Head Wo Contrast  06/14/2015  CLINICAL DATA:  Fall today. Neck pain. Paresthesias in the left lower extremity. EXAM: CT HEAD WITHOUT CONTRAST TECHNIQUE: Contiguous axial images were obtained from the base of the skull through the vertex without intravenous contrast. COMPARISON:  CT head without contrast 06/14/2015 FINDINGS: No acute cortical infarct, hemorrhage, or mass lesion is present. The ventricles are of normal size. No significant extra-axial fluid collection is evident. The paranasal sinuses and mastoid air cells are clear. The calvarium is intact. The globes and orbits are intact. No significant extracranial soft tissue lesions are present. IMPRESSION: Negative CT of the head.  Electronically Signed   By: Marin Robertshristopher  Mattern M.D.   On: 06/14/2015 15:13   I have personally reviewed and evaluated these images and lab results as part of my medical decision-making.   EKG Interpretation None     Meds given in ED:  Medications  oxyCODONE (Oxy IR/ROXICODONE) immediate release tablet 5 mg (5 mg Oral Given 06/14/15 1619)    Discharge Medication List as of 06/14/2015  4:08 PM       Filed Vitals:   06/14/15 1415 06/14/15 1530 06/14/15 1545 06/14/15 1604  BP: 128/92 135/83 116/82 127/86  Pulse: 62 76 61 61  Temp:    97.9 F (36.6 C)  TempSrc:    Oral  Resp: 13 18 19 14   SpO2: 99% 100% 98% 99%    MDM  Gary Davis is a 59 y.o. male with  recent C-spine fusion, comes in for ventilation after a mechanical fall. No anticoagulation. Patient fell forward, was not using his cane as he typically does. Larey SeatFell forward on to his head. Reports headache and neck pain. Physical CT scan of his head and neck are negative for any acute pathology. Nonfocal neuro exam. He does report mild paresthesias to the left medial calf but no low back pain, loss of bowel or bladder function, saddle paresthesias or other evidence of cauda equina syndrome. Patient overall appears well, he ambulates with his cane throughout the ED without difficulty. Appropriate for outpatient follow-up. Final diagnoses:  Fall, initial encounter  Neck pain        Joycie PeekBenjamin Conor Lata, PA-C 06/14/15 1647  Mancel BaleElliott Wentz, MD 06/14/15 [phone]

## 2015-06-25 ENCOUNTER — Other Ambulatory Visit: Payer: Self-pay

## 2015-06-29 ENCOUNTER — Encounter: Payer: Self-pay | Admitting: Internal Medicine

## 2015-06-29 ENCOUNTER — Ambulatory Visit: Payer: Medicaid Other | Attending: Internal Medicine | Admitting: Internal Medicine

## 2015-06-29 VITALS — BP 121/71 | HR 72 | Temp 98.0°F | Resp 16 | Ht 73.0 in | Wt 165.0 lb

## 2015-06-29 DIAGNOSIS — N400 Enlarged prostate without lower urinary tract symptoms: Secondary | ICD-10-CM | POA: Insufficient documentation

## 2015-06-29 DIAGNOSIS — K219 Gastro-esophageal reflux disease without esophagitis: Secondary | ICD-10-CM | POA: Insufficient documentation

## 2015-06-29 DIAGNOSIS — M542 Cervicalgia: Secondary | ICD-10-CM | POA: Insufficient documentation

## 2015-06-29 DIAGNOSIS — F1010 Alcohol abuse, uncomplicated: Secondary | ICD-10-CM | POA: Diagnosis not present

## 2015-06-29 DIAGNOSIS — Z9181 History of falling: Secondary | ICD-10-CM | POA: Diagnosis not present

## 2015-06-29 DIAGNOSIS — F17210 Nicotine dependence, cigarettes, uncomplicated: Secondary | ICD-10-CM | POA: Insufficient documentation

## 2015-06-29 DIAGNOSIS — Z885 Allergy status to narcotic agent status: Secondary | ICD-10-CM | POA: Diagnosis not present

## 2015-06-29 DIAGNOSIS — W19XXXD Unspecified fall, subsequent encounter: Secondary | ICD-10-CM

## 2015-06-29 MED ORDER — TAMSULOSIN HCL 0.4 MG PO CAPS: 0.4000 mg | ORAL_CAPSULE | Freq: Every day | ORAL | Status: DC

## 2015-06-29 MED ORDER — CYCLOBENZAPRINE HCL 10 MG PO TABS: ORAL_TABLET | ORAL | Status: DC

## 2015-06-29 MED ORDER — PANTOPRAZOLE SODIUM 40 MG PO TBEC: 40.0000 mg | DELAYED_RELEASE_TABLET | Freq: Every day | ORAL | Status: DC

## 2015-06-29 MED ORDER — GABAPENTIN 300 MG PO CAPS: ORAL_CAPSULE | ORAL | Status: DC

## 2015-06-29 NOTE — Progress Notes (Signed)
Patient here for follow up and to keep his doctor updated Larey SeatFell about a month ago when he knee gave out and suffered a cervical fracture Currently wearing a neck collar and [facility] Patient also in need of medication refills

## 2015-06-29 NOTE — Progress Notes (Signed)
Patient ID: Gary Davis, male   DOB: 03/18/1956, 59 y.o.   MRN: [id number]  CC: fall follow   HPI: Gary Davis is a 59 y.o. male here today for a follow up visit.  Patient has past medical history of arthritis, tobacco use, and cataracts. Patient reports that 2 weeks ago he was walking through his house without his cane, and his right knee gave out on him causing him to fall. He went to the Er for evaluation and was cleared to leave. He was already wearing a neck brace due to a fall last month when he was reaching for his cane and fell, suffering a cervical fracture. He at that time received a cervical fusion and is currently being followed by [facility]. His orthopedic provider has evaluated his knee and has told him he has arthritis. He was told that he may need a knee replacement but he is waiting on his neck to heal before considering knee surgery.  He is requesting a refill of his Flomax for weak urine stream and Gabapentin for the tingling in his fingers since his neck surgery.   Allergies  Allergen Reactions  . Percocet [Oxycodone-Acetaminophen] Nausea And Vomiting    Can take oxycodone without tylenol additive  . Tramadol Nausea Only   Past Medical History  Diagnosis Date  . Arthritis   . Cataract   . Alcohol abuse    Current Outpatient Prescriptions on File Prior to Visit  Medication Sig Dispense Refill  . cyclobenzaprine (FLEXERIL) 10 MG tablet Take 1 tablet (10 mg total) by mouth 3 (three) times daily as needed for muscle spasms. 60 tablet 0  . gabapentin (NEURONTIN) 300 MG capsule TAKE 1 CAPSULE (300 MG TOTAL) BY MOUTH 3 (THREE) TIMES DAILY. 90 capsule 0  . Oxycodone HCl 20 MG TABS Take 1 tablet by mouth every 6 (six) hours as needed (pain).    . pantoprazole (PROTONIX) 40 MG tablet Take 1 tablet (40 mg total) by mouth daily. 30 tablet 3  . tamsulosin (FLOMAX) 0.4 MG CAPS capsule Take 1 capsule (0.4 mg total) by mouth daily. 30 capsule 3  . cyclobenzaprine  (FLEXERIL) 10 MG tablet TAKE 1 TABLET (10 MG TOTAL) BY MOUTH 3 (THREE) TIMES DAILY AS NEEDED FOR MUSCLE SPASMS. (Patient not taking: Reported on 06/14/2015) 60 tablet 0  . oxyCODONE (ROXICODONE) 30 MG immediate release tablet Take 1 tablet (30 mg total) by mouth every 4 (four) hours as needed for moderate pain. (Patient not taking: Reported on 06/14/2015) 60 tablet 0  . oxyCODONE (ROXICODONE) 5 MG immediate release tablet Take 1 tablet (5 mg total) by mouth every 4 (four) hours as needed for severe pain. (Patient not taking: Reported on 06/14/2015) 20 tablet 0  . predniSONE (DELTASONE) 20 MG tablet Take 2 tablets (40 mg total) by mouth daily. (Patient not taking: Reported on 06/14/2015) 10 tablet 0   No current facility-administered medications on file prior to visit.   Family History  Problem Relation Age of Onset  . Cancer Mother     breast cancer   Social History   Social History  . Marital Status: Single    Spouse Name: N/A  . Number of Children: N/A  . Years of Education: N/A   Occupational History  . Not on file.   Social History Main Topics  . Smoking status: Current Every Day Smoker -- 0.33 packs/day for 15 years    Types: Cigarettes  . Smokeless tobacco: Not on file  . Alcohol Use:  3.6 oz/week    6 Cans of beer per week     Comment: daily  . Drug Use: No  . Sexual Activity: Yes   Other Topics Concern  . Not on file   Social History Narrative    Review of Systems: Other than what is stated in HPI, all other systems are negative.   Objective:   Filed Vitals:   06/29/15 1509  BP: 121/71  Pulse: 72  Temp: 98 F (36.7 C)  Resp: 16    Physical Exam  Constitutional: He is oriented to person, place, and time.  Cardiovascular: Normal rate, regular rhythm and normal heart sounds.   Pulmonary/Chest: Effort normal and breath sounds normal.  Musculoskeletal: Normal range of motion. He exhibits no tenderness (knee).  Neck collar in place Uses cane for mobility   Neurological: He is alert and oriented to person, place, and time.  Psychiatric: He has a normal mood and affect.     Lab Results  Component Value Date   WBC 6.3 04/14/2015   HGB 15.3 04/14/2015   HCT 45.0 04/14/2015   MCV 97.6 04/14/2015   PLT 176 04/14/2015   Lab Results  Component Value Date   CREATININE 0.90 04/14/2015   BUN 18 04/14/2015   NA 139 04/14/2015   K 3.9 04/14/2015   CL 102 04/14/2015   CO2 24 11/10/2014    Lab Results  Component Value Date   HGBA1C 5.60 07/27/2014   Lipid Panel     Component Value Date/Time   CHOL 163 07/27/2014 1217   TRIG 57 07/27/2014 1217   HDL 65 07/27/2014 1217   CHOLHDL 2.5 07/27/2014 1217   VLDL 11 07/27/2014 1217   LDLCALC 87 07/27/2014 1217       Assessment and plan:   Gary Davis was seen today for follow-up.  Diagnoses and all orders for this visit:  Fall, subsequent encounter Explained the importance of using cane with all mobility to decrease his chance of falls and further injuries.  He will need to consider additional treatment for his knee  Neck pain -     cyclobenzaprine (FLEXERIL) 10 MG tablet; TAKE 1 TABLET (10 MG TOTAL) BY MOUTH 3 (THREE) TIMES DAILY AS NEEDED FOR MUSCLE SPASMS. -     gabapentin (NEURONTIN) 300 MG capsule; TAKE 1 CAPSULE (300 MG TOTAL) BY MOUTH 3 (THREE) TIMES DAILY. Continue follow up with Orthopedics  BPH (benign prostatic hyperplasia) -     tamsulosin (FLOMAX) 0.4 MG CAPS capsule; Take 1 capsule (0.4 mg total) by mouth daily. -     PSA Will refill his Flomax and will check a PSA. He refused digital rectal exam today.   Gastroesophageal reflux disease, esophagitis presence not specified -    Refill pantoprazole (PROTONIX) 40 MG tablet; Take 1 tablet (40 mg total) by mouth daily. Discussed diet and weight with patient relating to acid reflux.  Went over things that may exacerbate acid reflux such as tomatoes, spicy foods, coffee, carbonated beverages, chocolates, etc.  Advised patient  to avoid laying down at least two hours after meals and sleep with HOB elevated.    Return if symptoms worsen or fail to improve.       Ambrose Finland, NP-C [facility] [phone] 06/29/2015, 3:34 PM

## 2015-06-30 ENCOUNTER — Other Ambulatory Visit: Payer: Self-pay | Admitting: Internal Medicine

## 2015-06-30 ENCOUNTER — Telehealth: Payer: Self-pay

## 2015-06-30 DIAGNOSIS — R972 Elevated prostate specific antigen [PSA]: Secondary | ICD-10-CM

## 2015-06-30 LAB — PSA: PSA: 4.94 ng/mL — AB (ref <=4.00)

## 2015-06-30 NOTE — Telephone Encounter (Signed)
-----   Message from Ambrose FinlandValerie A Keck, NP sent at 06/30/2015 12:35 PM EST ----- His PSA is elevated. This is a cancer marker for the prostate. Whenever this is elevated we send to Urology for evaluation. I will place referral. Does he have insurance? If not he will need to apply for cone discount asap so that he can go.

## 2015-06-30 NOTE — Telephone Encounter (Signed)
Called patient  Patient not available Left message on voice mail to return our call

## 2015-07-03 ENCOUNTER — Ambulatory Visit
Admission: RE | Admit: 2015-07-03 | Discharge: 2015-07-03 | Disposition: A | Discharge status: Home / Self Care | Payer: Self-pay | Source: Ambulatory Visit | Attending: Neurosurgery | Admitting: Neurosurgery

## 2015-07-03 ENCOUNTER — Other Ambulatory Visit: Payer: Self-pay

## 2015-07-03 DIAGNOSIS — M47812 Spondylosis without myelopathy or radiculopathy, cervical region: Secondary | ICD-10-CM

## 2015-07-06 ENCOUNTER — Other Ambulatory Visit: Payer: Self-pay | Admitting: Neurosurgery

## 2015-07-06 DIAGNOSIS — M4712 Other spondylosis with myelopathy, cervical region: Secondary | ICD-10-CM

## 2015-09-03 ENCOUNTER — Ambulatory Visit
Admission: RE | Admit: 2015-09-03 | Discharge: 2015-09-03 | Disposition: A | Discharge status: Home / Self Care | Payer: Medicaid Other | Source: Ambulatory Visit | Attending: Neurosurgery | Admitting: Neurosurgery

## 2015-09-03 DIAGNOSIS — M4712 Other spondylosis with myelopathy, cervical region: Secondary | ICD-10-CM

## 2015-09-15 ENCOUNTER — Ambulatory Visit: Payer: Medicaid Other | Attending: Neurosurgery

## 2015-09-15 DIAGNOSIS — M542 Cervicalgia: Secondary | ICD-10-CM | POA: Insufficient documentation

## 2015-09-15 DIAGNOSIS — R262 Difficulty in walking, not elsewhere classified: Secondary | ICD-10-CM | POA: Insufficient documentation

## 2015-09-15 DIAGNOSIS — R531 Weakness: Secondary | ICD-10-CM

## 2015-09-15 NOTE — Therapy (Signed)
[facility] [address] [place], [place], [address] Phone: [phone]   Fax:  [phone]  Patient Details  Name: Gary RidingMichael D Davis MRN: [id number] Date of Birth: 07/28/1955 Referring Provider:  Donalee Citrinram, Gary, MD  Encounter Date: 09/15/2015    Gary  Davis came today for Pt evaluation and we discussed his Medicaid coverage which does not include paying for PT .  He would have qualified for visits after his neck surgery if he had come in within 3 months of the surgical date. He is 4 months out so Medicaid does not pay for PT after this time frame.  He is also planning on having a TKA later this year and he decided not to have the PT eval now as Medicaid only pays for one PT eval per year  So his PT eval after TKA would be covered.  He was offered that we would see him but this would be self pay and he did not want to have debt for therapy visits so decided against this option. He was [facility] but he did not have transportation.  Sorry we could not help him.  Caprice RedChasse, Yisrael Obryan M PT 09/15/2015, 9:42 AM  [facility] [address] [place], [place], [address] Phone: [phone]   Fax:  [phone]

## 2015-09-24 ENCOUNTER — Encounter: Payer: Self-pay | Admitting: Internal Medicine

## 2015-09-24 ENCOUNTER — Ambulatory Visit: Payer: Medicaid Other | Attending: Internal Medicine | Admitting: Internal Medicine

## 2015-09-24 VITALS — BP 132/82 | HR 76 | Temp 98.0°F | Resp 16 | Ht 73.0 in | Wt 167.8 lb

## 2015-09-24 DIAGNOSIS — M25542 Pain in joints of left hand: Secondary | ICD-10-CM

## 2015-09-24 DIAGNOSIS — Z8042 Family history of malignant neoplasm of prostate: Secondary | ICD-10-CM | POA: Diagnosis not present

## 2015-09-24 DIAGNOSIS — F17210 Nicotine dependence, cigarettes, uncomplicated: Secondary | ICD-10-CM | POA: Insufficient documentation

## 2015-09-24 DIAGNOSIS — Z888 Allergy status to other drugs, medicaments and biological substances status: Secondary | ICD-10-CM | POA: Insufficient documentation

## 2015-09-24 DIAGNOSIS — R972 Elevated prostate specific antigen [PSA]: Secondary | ICD-10-CM | POA: Diagnosis not present

## 2015-09-24 DIAGNOSIS — Z981 Arthrodesis status: Secondary | ICD-10-CM | POA: Insufficient documentation

## 2015-09-24 DIAGNOSIS — M79641 Pain in right hand: Secondary | ICD-10-CM | POA: Insufficient documentation

## 2015-09-24 DIAGNOSIS — Z79899 Other long term (current) drug therapy: Secondary | ICD-10-CM | POA: Diagnosis not present

## 2015-09-24 DIAGNOSIS — M1990 Unspecified osteoarthritis, unspecified site: Secondary | ICD-10-CM | POA: Diagnosis not present

## 2015-09-24 DIAGNOSIS — M2550 Pain in unspecified joint: Secondary | ICD-10-CM | POA: Insufficient documentation

## 2015-09-24 DIAGNOSIS — M25541 Pain in joints of right hand: Secondary | ICD-10-CM

## 2015-09-24 DIAGNOSIS — F1010 Alcohol abuse, uncomplicated: Secondary | ICD-10-CM | POA: Diagnosis not present

## 2015-09-24 DIAGNOSIS — M79642 Pain in left hand: Secondary | ICD-10-CM | POA: Diagnosis not present

## 2015-09-24 LAB — RHEUMATOID FACTOR: Rheumatoid fact SerPl-aCnc: 10 IU/mL (ref <=14)

## 2015-09-24 MED ORDER — MELOXICAM 15 MG PO TABS: 15.0000 mg | ORAL_TABLET | Freq: Every day | ORAL | Status: DC

## 2015-09-24 MED FILL — MELOXICAM 15 MG TABLET: 15 | 30 days supply | Qty: 30 | Fill #0

## 2015-09-24 NOTE — Progress Notes (Signed)
Patient complains of bilateral hand pain and joint swelling Patient is requesting a referral to a specialist for his joint pain

## 2015-09-24 NOTE — Progress Notes (Signed)
Patient ID: Gary Davis, male   DOB: 17-Jun-1956, 60 y.o.   MRN: [id number]  CC: joint pain   HPI: Gary Davis is a 60 y.o. male here today for a follow up visit.  Patient has past medical history of arthritis, recent cervical fusion surgery, and alcohol abuse. Patient reports that the last time he was here he never received his PSA blood test results. He has a family history of prostate cancer. He would like results today.  Patient also complains of bilateral hand pain and joint swelling for the past several months. Pain has become increasingly worse since he had cervical spine fusion. He states that his Orthopedic surgeon has suggested that he have a referral to a hand specialist.   Allergies  Allergen Reactions  . Percocet [Oxycodone-Acetaminophen] Nausea And Vomiting    Can take oxycodone without tylenol additive  . Tramadol Nausea Only   Past Medical History  Diagnosis Date  . Arthritis   . Cataract   . Alcohol abuse    Current Outpatient Prescriptions on File Prior to Visit  Medication Sig Dispense Refill  . cyclobenzaprine (FLEXERIL) 10 MG tablet TAKE 1 TABLET (10 MG TOTAL) BY MOUTH 3 (THREE) TIMES DAILY AS NEEDED FOR MUSCLE SPASMS. 60 tablet 1  . gabapentin (NEURONTIN) 300 MG capsule TAKE 1 CAPSULE (300 MG TOTAL) BY MOUTH 3 (THREE) TIMES DAILY. 90 capsule 3  . pantoprazole (PROTONIX) 40 MG tablet Take 1 tablet (40 mg total) by mouth daily. 30 tablet 3  . tamsulosin (FLOMAX) 0.4 MG CAPS capsule Take 1 capsule (0.4 mg total) by mouth daily. 30 capsule 3  . oxyCODONE (ROXICODONE) 30 MG immediate release tablet Take 1 tablet (30 mg total) by mouth every 4 (four) hours as needed for moderate pain. (Patient not taking: Reported on 06/14/2015) 60 tablet 0  . oxyCODONE (ROXICODONE) 5 MG immediate release tablet Take 1 tablet (5 mg total) by mouth every 4 (four) hours as needed for severe pain. (Patient not taking: Reported on 06/14/2015) 20 tablet 0  . Oxycodone HCl 20 MG TABS  Take 1 tablet by mouth every 6 (six) hours as needed (pain).     No current facility-administered medications on file prior to visit.   Family History  Problem Relation Age of Onset  . Cancer Mother     breast cancer   Social History   Social History  . Marital Status: Single    Spouse Name: N/A  . Number of Children: N/A  . Years of Education: N/A   Occupational History  . Not on file.   Social History Main Topics  . Smoking status: Current Every Day Smoker -- 0.33 packs/day for 15 years    Types: Cigarettes  . Smokeless tobacco: Not on file  . Alcohol Use: 3.6 oz/week    6 Cans of beer per week     Comment: daily  . Drug Use: No  . Sexual Activity: Yes   Other Topics Concern  . Not on file   Social History Narrative    Review of Systems: Other than what is stated in HPI, all other systems are negative.   Objective:   Filed Vitals:   09/24/15 1420  BP: 132/82  Pulse: 76  Temp: 98 F (36.7 C)  Resp: 16     Physical Exam  Constitutional: He is oriented to person, place, and time.  Cardiovascular: Normal rate, regular rhythm and normal heart sounds.   Pulmonary/Chest: Effort normal and breath sounds normal.  Musculoskeletal: He  exhibits no tenderness.  No swelling of joint. Normal ROM. Neck collar in place  Neurological: He is alert and oriented to person, place, and time.  Skin: Skin is warm and dry.  Psychiatric: He has a normal mood and affect.     Lab Results  Component Value Date   WBC 6.3 04/14/2015   HGB 15.3 04/14/2015   HCT 45.0 04/14/2015   MCV 97.6 04/14/2015   PLT 176 04/14/2015   Lab Results  Component Value Date   CREATININE 0.90 04/14/2015   BUN 18 04/14/2015   NA 139 04/14/2015   K 3.9 04/14/2015   CL 102 04/14/2015   CO2 24 11/10/2014    Lab Results  Component Value Date   HGBA1C 5.60 07/27/2014   Lipid Panel     Component Value Date/Time   CHOL 163 07/27/2014 1217   TRIG 57 07/27/2014 1217   HDL 65 07/27/2014  1217   CHOLHDL 2.5 07/27/2014 1217   VLDL 11 07/27/2014 1217   LDLCALC 87 07/27/2014 1217       Assessment and plan:   Gary Davis was seen today for hand pain.  Diagnoses and all orders for this visit:  Arthralgia of both hands -     Vitamin D, 25-hydroxy -     Rheumatoid factor -     ANA -     Ambulatory referral to Hand Surgery -     meloxicam (MOBIC) 15 MG tablet; Take 1 tablet (15 mg total) by mouth daily. Patients insurance will not cover PT for his hands. If labs come back negative, maybe a referral to a hand specialist can assist with other approaches at this time   Elevated PSA -     Ambulatory referral to Urology Last PSA was elevated, he will need urology for further management  Return if symptoms worsen or fail to improve.       Ambrose FinlandValerie A Keck, NP-C [facility] [phone] 09/24/2015, 3:11 PM

## 2015-09-25 LAB — VITAMIN D 25 HYDROXY (VIT D DEFICIENCY, FRACTURES): VIT D 25 HYDROXY: 13 ng/mL — AB (ref 30–100)

## 2015-09-27 LAB — ANA: ANA: NEGATIVE

## 2015-09-28 ENCOUNTER — Telehealth: Payer: Self-pay

## 2015-09-28 MED ORDER — VITAMIN D (ERGOCALCIFEROL) 1.25 MG (50000 UNIT) PO CAPS: 50000.0000 [IU] | ORAL_CAPSULE | ORAL | Status: DC

## 2015-09-28 NOTE — Telephone Encounter (Signed)
-----   Message from Ambrose FinlandValerie A Keck, NP sent at 09/27/2015  8:37 PM EDT ----- Autoimmune test are negative but his Vitamin D is very low. Please send drisdol 50,000 IU to take once weekly for 12 weeks. 12 tablets no refills. Hopefully hand specialist can offer more help

## 2015-09-28 NOTE — Telephone Encounter (Signed)
Called patient this afternoon Patient not available Message left on voice mail to return our call 

## 2015-09-29 ENCOUNTER — Telehealth: Payer: Self-pay | Admitting: Internal Medicine

## 2015-09-29 ENCOUNTER — Telehealth: Payer: Self-pay

## 2015-09-29 NOTE — Telephone Encounter (Signed)
Patient returning nurses call please call patient back.

## 2015-09-29 NOTE — Telephone Encounter (Signed)
Returned patient phone call Patient not available Message left on voice mail to return our call 

## 2015-10-01 ENCOUNTER — Telehealth: Payer: Self-pay

## 2015-10-01 NOTE — Telephone Encounter (Signed)
Returned patient phone call Patient not available Message left on voice mail to return our call 

## 2015-10-01 NOTE — Telephone Encounter (Signed)
Patient returned phone call in regards to results, please f/u with pt at [phone].

## 2015-10-05 ENCOUNTER — Telehealth: Payer: Self-pay

## 2015-10-05 NOTE — Telephone Encounter (Signed)
Patient returned phone call and is aware of his lab results And to pick up vitamin D at the pharmacy here

## 2015-10-25 ENCOUNTER — Other Ambulatory Visit: Payer: Self-pay | Admitting: Orthopaedic Surgery

## 2015-10-29 ENCOUNTER — Other Ambulatory Visit: Payer: Self-pay | Admitting: Orthopaedic Surgery

## 2015-11-02 ENCOUNTER — Encounter (HOSPITAL_COMMUNITY)
Admission: RE | Admit: 2015-11-02 | Discharge: 2015-11-02 | Disposition: A | Discharge status: Home / Self Care | Payer: Medicaid Other | Source: Ambulatory Visit | Attending: Orthopaedic Surgery | Admitting: Orthopaedic Surgery

## 2015-11-02 ENCOUNTER — Encounter (HOSPITAL_COMMUNITY): Payer: Self-pay

## 2015-11-02 DIAGNOSIS — M1711 Unilateral primary osteoarthritis, right knee: Secondary | ICD-10-CM | POA: Diagnosis not present

## 2015-11-02 DIAGNOSIS — Z0183 Encounter for blood typing: Secondary | ICD-10-CM | POA: Insufficient documentation

## 2015-11-02 DIAGNOSIS — Z01812 Encounter for preprocedural laboratory examination: Secondary | ICD-10-CM | POA: Diagnosis not present

## 2015-11-02 HISTORY — DX: Major depressive disorder, single episode, unspecified: F32.9

## 2015-11-02 HISTORY — DX: Depression, unspecified: F32.A

## 2015-11-02 HISTORY — DX: Gastro-esophageal reflux disease without esophagitis: K21.9

## 2015-11-02 LAB — COMPREHENSIVE METABOLIC PANEL
ALBUMIN: 3.6 g/dL (ref 3.5–5.0)
ALT: 9 U/L — ABNORMAL LOW (ref 17–63)
ANION GAP: 7 (ref 5–15)
AST: 19 U/L (ref 15–41)
Alkaline Phosphatase: 71 U/L (ref 38–126)
BILIRUBIN TOTAL: 0.5 mg/dL (ref 0.3–1.2)
BUN: 12 mg/dL (ref 6–20)
CHLORIDE: 110 mmol/L (ref 101–111)
CO2: 25 mmol/L (ref 22–32)
Calcium: 9.1 mg/dL (ref 8.9–10.3)
Creatinine, Ser: 1.12 mg/dL (ref 0.61–1.24)
GFR calc Af Amer: >60 mL/min (ref >60)
GFR calc non Af Amer: >60 mL/min (ref >60)
GLUCOSE: 96 mg/dL (ref 65–99)
POTASSIUM: 4.3 mmol/L (ref 3.5–5.1)
SODIUM: 142 mmol/L (ref 135–145)
Total Protein: 6.9 g/dL (ref 6.5–8.1)

## 2015-11-02 LAB — CBC WITH DIFFERENTIAL/PLATELET
BASOS ABS: 0 10*3/uL (ref 0.0–0.1)
BASOS PCT: 0 %
EOS ABS: 0.1 10*3/uL (ref 0.0–0.7)
Eosinophils Relative: 2 %
HEMATOCRIT: 38.4 % — AB (ref 39.0–52.0)
Hemoglobin: 13.3 g/dL (ref 13.0–17.0)
Lymphocytes Relative: 26 %
Lymphs Abs: 1.5 10*3/uL (ref 0.7–4.0)
MCH: 32.5 pg (ref 26.0–34.0)
MCHC: 34.6 g/dL (ref 30.0–36.0)
MCV: 93.9 fL (ref 78.0–100.0)
MONO ABS: 0.7 10*3/uL (ref 0.1–1.0)
Monocytes Relative: 13 %
NEUTROS ABS: 3.5 10*3/uL (ref 1.7–7.7)
Neutrophils Relative %: 59 %
PLATELETS: 241 10*3/uL (ref 150–400)
RBC: 4.09 MIL/uL — ABNORMAL LOW (ref 4.22–5.81)
RDW: 12.3 % (ref 11.5–15.5)
WBC: 5.9 10*3/uL (ref 4.0–10.5)

## 2015-11-02 LAB — TYPE AND SCREEN
ABO/RH(D): A NEG
ANTIBODY SCREEN: NEGATIVE

## 2015-11-02 LAB — PROTIME-INR
INR: 1.1 (ref 0.00–1.49)
Prothrombin Time: 14.4 seconds (ref 11.6–15.2)

## 2015-11-02 LAB — URINALYSIS, ROUTINE W REFLEX MICROSCOPIC
BILIRUBIN URINE: NEGATIVE
Glucose, UA: NEGATIVE mg/dL
HGB URINE DIPSTICK: NEGATIVE
KETONES UR: NEGATIVE mg/dL
Leukocytes, UA: NEGATIVE
NITRITE: NEGATIVE
Protein, ur: NEGATIVE mg/dL
Specific Gravity, Urine: 1.014 (ref 1.005–1.030)
pH: 5 (ref 5.0–8.0)

## 2015-11-02 LAB — SEDIMENTATION RATE: SED RATE: 8 mm/h (ref 0–16)

## 2015-11-02 LAB — APTT: APTT: 27 s (ref 24–37)

## 2015-11-02 LAB — ABO/RH: ABO/RH(D): A NEG

## 2015-11-02 LAB — SURGICAL PCR SCREEN
MRSA, PCR: NEGATIVE
Staphylococcus aureus: NEGATIVE

## 2015-11-02 LAB — C-REACTIVE PROTEIN

## 2015-11-02 NOTE — Pre-Procedure Instructions (Signed)
Veverly FellsMichael D Luscher  11/02/2015      [id number] [place], [place] - [phone] AT [facility] [address] [place] [address] Phone: [phone] Fax: [phone]  [facility] [phone][id number] [place], [place] - [address] [facility] [address] [facility] [place] [address] Phone: [phone] Fax: [phone]  [facility] - [place], [place] - [address] AT [facility] [address] [place] [id number] Phone: [phone] Fax: [phone]  [facility] - [place], [place] - [address] [place] [address] Phone: [phone] Fax: [phone]    Your procedure is scheduled on   Thursday  11/04/15   [facility] at 1000 A.M.  Call this number if you have problems the morning of surgery:  [phone]   Remember:  Do not eat food or drink liquids after midnight.  Take these medicines the morning of surgery with A SIP OF WATER  Gabapentin, oxycodone, pantoprazole , baclofen      (stop meloxicam/ mobic, no aspirin or aspirin products, ibuprofen/ advil/ motrin, goodys, bc's, herbal medicines)      Do not wear jewelry, make-up or nail polish.  Do not wear lotions, powders, or perfumes.  You may wear deodorant.  Do not shave 48 hours prior to surgery.  Men may shave face and neck.  Do not bring valuables to the hospital.  [facility] is not responsible for any belongings or valuables.  Contacts, dentures or bridgework may not be worn into surgery.  Leave your suitcase in the car.  After surgery it may be brought to your room.  For patients admitted to the hospital, discharge time will be determined by your treatment team.  Patients discharged the day of surgery will not be allowed to drive home.   Name and phone number of your driver:   Special instructions:  [place] - Preparing for Surgery  Before  surgery, you can play an important role.  Because skin is not sterile, your skin needs to be as free of germs as possible.  You can reduce the number of germs on you skin by washing with CHG (chlorahexidine gluconate) soap before surgery.  CHG is an antiseptic cleaner which kills germs and bonds with the skin to continue killing germs even after washing.  Please DO NOT use if you have an allergy to CHG or antibacterial soaps.  If your skin becomes reddened/irritated stop using the CHG and inform your nurse when you arrive at Short Stay.  Do not shave (including legs and underarms) for at least 48 hours prior to the first CHG shower.  You may shave your face.  Please follow these instructions carefully:   1.  Shower with CHG Soap the night before surgery and the                                morning of Surgery.  2.  If you choose to wash your hair, wash your hair first as usual with your       normal shampoo.  3.  After you shampoo, rinse your hair and body thoroughly to remove the                      Shampoo.  4.  Use CHG as you would any  other liquid soap.  You can apply chg directly       to the skin and wash gently with scrungie or a clean washcloth.  5.  Apply the CHG Soap to your body ONLY FROM THE NECK DOWN.        Do not use on open wounds or open sores.  Avoid contact with your eyes,       ears, mouth and genitals (private parts).  Wash genitals (private parts)       with your normal soap.  6.  Wash thoroughly, paying special attention to the area where your surgery        will be performed.  7.  Thoroughly rinse your body with warm water from the neck down.  8.  DO NOT shower/wash with your normal soap after using and rinsing off       the CHG Soap.  9.  Pat yourself dry with a clean towel.            10.  Wear clean pajamas.            11.  Place clean sheets on your bed the night of your first shower and do not        sleep with pets.  Day of Surgery  Do not apply any  lotions/deoderants the morning of surgery.  Please wear clean clothes to the hospital/surgery center.    Please read over the following fact sheets that you were given. Pain Booklet, Coughing and Deep Breathing, Blood Transfusion Information, Total Joint Packet, MRSA Information and Surgical Site Infection Prevention

## 2015-11-03 MED ORDER — TRANEXAMIC ACID 1000 MG/10ML IV SOLN
1000.0000 mg | INTRAVENOUS | Status: AC
  Administered 2015-11-04: 1000 mg via INTRAVENOUS
  Filled 2015-11-03: qty 10

## 2015-11-04 ENCOUNTER — Inpatient Hospital Stay (HOSPITAL_COMMUNITY): Payer: Medicaid Other

## 2015-11-04 ENCOUNTER — Inpatient Hospital Stay (HOSPITAL_COMMUNITY): Payer: Medicaid Other | Admitting: Anesthesiology

## 2015-11-04 ENCOUNTER — Encounter (HOSPITAL_COMMUNITY)
Admission: RE | Disposition: A | Discharge status: Home Health | Payer: Self-pay | Source: Ambulatory Visit | Attending: Orthopaedic Surgery

## 2015-11-04 ENCOUNTER — Inpatient Hospital Stay (HOSPITAL_COMMUNITY)
Admission: RE | Admit: 2015-11-04 | Discharge: 2015-11-08 | DRG: 470 | Disposition: A | Discharge status: Home Health | Payer: Medicaid Other | Source: Ambulatory Visit | Attending: Orthopaedic Surgery | Admitting: Orthopaedic Surgery

## 2015-11-04 ENCOUNTER — Encounter (HOSPITAL_COMMUNITY): Payer: Self-pay | Admitting: *Deleted

## 2015-11-04 DIAGNOSIS — F17210 Nicotine dependence, cigarettes, uncomplicated: Secondary | ICD-10-CM | POA: Diagnosis present

## 2015-11-04 DIAGNOSIS — K219 Gastro-esophageal reflux disease without esophagitis: Secondary | ICD-10-CM | POA: Diagnosis present

## 2015-11-04 DIAGNOSIS — F329 Major depressive disorder, single episode, unspecified: Secondary | ICD-10-CM | POA: Diagnosis present

## 2015-11-04 DIAGNOSIS — Z885 Allergy status to narcotic agent status: Secondary | ICD-10-CM | POA: Diagnosis not present

## 2015-11-04 DIAGNOSIS — M549 Dorsalgia, unspecified: Secondary | ICD-10-CM | POA: Diagnosis present

## 2015-11-04 DIAGNOSIS — G8929 Other chronic pain: Secondary | ICD-10-CM | POA: Diagnosis present

## 2015-11-04 DIAGNOSIS — Z803 Family history of malignant neoplasm of breast: Secondary | ICD-10-CM | POA: Diagnosis not present

## 2015-11-04 DIAGNOSIS — M1711 Unilateral primary osteoarthritis, right knee: Secondary | ICD-10-CM | POA: Diagnosis present

## 2015-11-04 DIAGNOSIS — Z981 Arthrodesis status: Secondary | ICD-10-CM

## 2015-11-04 DIAGNOSIS — Z96659 Presence of unspecified artificial knee joint: Secondary | ICD-10-CM

## 2015-11-04 DIAGNOSIS — D62 Acute posthemorrhagic anemia: Secondary | ICD-10-CM | POA: Diagnosis not present

## 2015-11-04 HISTORY — PX: TOTAL KNEE ARTHROPLASTY: SHX125

## 2015-11-04 HISTORY — DX: Dorsalgia, unspecified: M54.9

## 2015-11-04 HISTORY — DX: Other chronic pain: G89.29

## 2015-11-04 SURGERY — ARTHROPLASTY, KNEE, TOTAL
Anesthesia: Monitor Anesthesia Care | Site: Knee | Laterality: Right

## 2015-11-04 MED ORDER — PANTOPRAZOLE SODIUM 40 MG PO TBEC
40.0000 mg | DELAYED_RELEASE_TABLET | Freq: Every day | ORAL | Status: DC
  Administered 2015-11-04 – 2015-11-08 (×5): 40 mg via ORAL
  Filled 2015-11-04 (×5): qty 1

## 2015-11-04 MED ORDER — HYDROMORPHONE HCL 1 MG/ML IJ SOLN
0.5000 mg | INTRAMUSCULAR | Status: DC | PRN
  Administered 2015-11-04 (×2): 0.5 mg via INTRAVENOUS

## 2015-11-04 MED ORDER — GLYCOPYRROLATE 0.2 MG/ML IJ SOLN
INTRAMUSCULAR | Status: DC | PRN
  Administered 2015-11-04: 0.2 mg via INTRAVENOUS

## 2015-11-04 MED ORDER — SENNOSIDES-DOCUSATE SODIUM 8.6-50 MG PO TABS: 1.0000 | ORAL_TABLET | Freq: Every evening | ORAL | Status: DC | PRN

## 2015-11-04 MED ORDER — OXYCODONE HCL 5 MG PO TABS
20.0000 mg | ORAL_TABLET | Freq: Four times a day (QID) | ORAL | Status: DC | PRN
  Administered 2015-11-04 – 2015-11-08 (×13): 20 mg via ORAL
  Filled 2015-11-04 (×16): qty 4

## 2015-11-04 MED ORDER — FENTANYL CITRATE (PF) 100 MCG/2ML IJ SOLN
INTRAMUSCULAR | Status: AC
  Filled 2015-11-04: qty 2

## 2015-11-04 MED ORDER — ASPIRIN EC 325 MG PO TBEC
325.0000 mg | DELAYED_RELEASE_TABLET | Freq: Two times a day (BID) | ORAL | Status: DC
  Administered 2015-11-04 – 2015-11-08 (×8): 325 mg via ORAL
  Filled 2015-11-04 (×8): qty 1

## 2015-11-04 MED ORDER — SODIUM CHLORIDE 0.9 % IV SOLN
1000.0000 mg | Freq: Once | INTRAVENOUS | Status: AC
  Administered 2015-11-04: 1000 mg via INTRAVENOUS
  Filled 2015-11-04: qty 10

## 2015-11-04 MED ORDER — ONDANSETRON HCL 4 MG/2ML IJ SOLN
INTRAMUSCULAR | Status: AC
  Filled 2015-11-04: qty 4

## 2015-11-04 MED ORDER — MENTHOL 3 MG MT LOZG: 1.0000 | LOZENGE | OROMUCOSAL | Status: DC | PRN

## 2015-11-04 MED ORDER — METOCLOPRAMIDE HCL 5 MG PO TABS: 5.0000 mg | ORAL_TABLET | Freq: Three times a day (TID) | ORAL | Status: DC | PRN

## 2015-11-04 MED ORDER — HYDROMORPHONE HCL 1 MG/ML IJ SOLN
INTRAMUSCULAR | Status: AC
  Filled 2015-11-04: qty 1

## 2015-11-04 MED ORDER — SORBITOL 70 % SOLN: 30.0000 mL | Freq: Every day | Status: DC | PRN

## 2015-11-04 MED ORDER — 0.9 % SODIUM CHLORIDE (POUR BTL) OPTIME
TOPICAL | Status: DC | PRN
  Administered 2015-11-04: 1000 mL

## 2015-11-04 MED ORDER — PHENOL 1.4 % MT LIQD: 1.0000 | OROMUCOSAL | Status: DC | PRN

## 2015-11-04 MED ORDER — OXYCODONE HCL 5 MG PO TABS
ORAL_TABLET | ORAL | Status: AC
  Filled 2015-11-04: qty 3

## 2015-11-04 MED ORDER — EPHEDRINE 5 MG/ML INJ
INTRAVENOUS | Status: AC
  Filled 2015-11-04: qty 20

## 2015-11-04 MED ORDER — OXYCODONE HCL 5 MG PO TABS: 5.0000 mg | ORAL_TABLET | ORAL | Status: DC | PRN

## 2015-11-04 MED ORDER — BACLOFEN 10 MG PO TABS
10.0000 mg | ORAL_TABLET | Freq: Three times a day (TID) | ORAL | Status: DC
  Administered 2015-11-04 – 2015-11-08 (×11): 10 mg via ORAL
  Filled 2015-11-04 (×11): qty 1

## 2015-11-04 MED ORDER — KETOROLAC TROMETHAMINE 30 MG/ML IJ SOLN
30.0000 mg | Freq: Four times a day (QID) | INTRAMUSCULAR | Status: AC | PRN
  Administered 2015-11-04 – 2015-11-06 (×5): 30 mg via INTRAVENOUS
  Filled 2015-11-04 (×4): qty 1

## 2015-11-04 MED ORDER — EPHEDRINE SULFATE 50 MG/ML IJ SOLN
INTRAMUSCULAR | Status: DC | PRN
  Administered 2015-11-04: 10 mg via INTRAVENOUS
  Administered 2015-11-04: 5 mg via INTRAVENOUS

## 2015-11-04 MED ORDER — METHOCARBAMOL 750 MG PO TABS: 750.0000 mg | ORAL_TABLET | Freq: Two times a day (BID) | ORAL | Status: DC | PRN

## 2015-11-04 MED ORDER — TRANEXAMIC ACID 1000 MG/10ML IV SOLN
2000.0000 mg | INTRAVENOUS | Status: DC | PRN
  Administered 2015-11-04: 2000 mg via TOPICAL

## 2015-11-04 MED ORDER — MAGNESIUM CITRATE PO SOLN: 1.0000 | Freq: Once | ORAL | Status: DC | PRN

## 2015-11-04 MED ORDER — SODIUM CHLORIDE 0.9 % IV SOLN
INTRAVENOUS | Status: DC
  Administered 2015-11-04: 17:00:00 via INTRAVENOUS

## 2015-11-04 MED ORDER — TRANEXAMIC ACID 1000 MG/10ML IV SOLN
2000.0000 mg | Freq: Once | INTRAVENOUS | Status: DC
  Filled 2015-11-04: qty 20

## 2015-11-04 MED ORDER — PROPOFOL 10 MG/ML IV BOLUS
INTRAVENOUS | Status: DC | PRN
  Administered 2015-11-04: 30 mg via INTRAVENOUS
  Administered 2015-11-04: 20 mg via INTRAVENOUS
  Administered 2015-11-04: 30 mg via INTRAVENOUS
  Administered 2015-11-04 (×2): 20 mg via INTRAVENOUS

## 2015-11-04 MED ORDER — KETOROLAC TROMETHAMINE 30 MG/ML IJ SOLN
INTRAMUSCULAR | Status: AC
Start: 2015-11-04 — End: 2015-11-04
  Administered 2015-11-04: 30 mg via INTRAVENOUS
  Filled 2015-11-04: qty 1

## 2015-11-04 MED ORDER — ACETAMINOPHEN 650 MG RE SUPP
650.0000 mg | Freq: Four times a day (QID) | RECTAL | Status: DC | PRN
Start: 2015-11-04 — End: 2015-11-08

## 2015-11-04 MED ORDER — FENTANYL CITRATE (PF) 100 MCG/2ML IJ SOLN
INTRAMUSCULAR | Status: DC | PRN
Start: 2015-11-04 — End: 2015-11-04
  Administered 2015-11-04 (×2): 25 ug via INTRAVENOUS
  Administered 2015-11-04 (×2): 50 ug via INTRAVENOUS
  Administered 2015-11-04: 100 ug via INTRAVENOUS

## 2015-11-04 MED ORDER — BUPIVACAINE LIPOSOME 1.3 % IJ SUSP
INTRAMUSCULAR | Status: DC | PRN
  Administered 2015-11-04: 20 mL

## 2015-11-04 MED ORDER — ARTIFICIAL TEARS OP OINT
TOPICAL_OINTMENT | OPHTHALMIC | Status: AC
  Filled 2015-11-04: qty 17.5

## 2015-11-04 MED ORDER — FENTANYL CITRATE (PF) 250 MCG/5ML IJ SOLN
INTRAMUSCULAR | Status: AC
  Filled 2015-11-04: qty 5

## 2015-11-04 MED ORDER — OXYCODONE HCL 5 MG PO TABS
5.0000 mg | ORAL_TABLET | ORAL | Status: DC | PRN
  Administered 2015-11-04 – 2015-11-08 (×24): 15 mg via ORAL
  Filled 2015-11-04 (×5): qty 3
  Filled 2015-11-04: qty 2
  Filled 2015-11-04 (×2): qty 3
  Filled 2015-11-04: qty 1
  Filled 2015-11-04 (×14): qty 3

## 2015-11-04 MED ORDER — ASPIRIN EC 325 MG PO TBEC: 325.0000 mg | DELAYED_RELEASE_TABLET | Freq: Two times a day (BID) | ORAL | Status: DC

## 2015-11-04 MED ORDER — ALUM & MAG HYDROXIDE-SIMETH 200-200-20 MG/5ML PO SUSP: 30.0000 mL | ORAL | Status: DC | PRN

## 2015-11-04 MED ORDER — METOCLOPRAMIDE HCL 5 MG/ML IJ SOLN: 5.0000 mg | Freq: Three times a day (TID) | INTRAMUSCULAR | Status: DC | PRN

## 2015-11-04 MED ORDER — GABAPENTIN 300 MG PO CAPS
300.0000 mg | ORAL_CAPSULE | Freq: Three times a day (TID) | ORAL | Status: DC
  Administered 2015-11-04 – 2015-11-08 (×11): 300 mg via ORAL
  Filled 2015-11-04 (×11): qty 1

## 2015-11-04 MED ORDER — ONDANSETRON HCL 4 MG/2ML IJ SOLN: 4.0000 mg | Freq: Four times a day (QID) | INTRAMUSCULAR | Status: DC | PRN

## 2015-11-04 MED ORDER — METHOCARBAMOL 500 MG PO TABS
ORAL_TABLET | ORAL | Status: AC
  Filled 2015-11-04: qty 1

## 2015-11-04 MED ORDER — SUCCINYLCHOLINE CHLORIDE 200 MG/10ML IV SOSY
PREFILLED_SYRINGE | INTRAVENOUS | Status: AC
  Filled 2015-11-04: qty 10

## 2015-11-04 MED ORDER — METHOCARBAMOL 1000 MG/10ML IJ SOLN
500.0000 mg | Freq: Four times a day (QID) | INTRAVENOUS | Status: DC | PRN
  Filled 2015-11-04: qty 5

## 2015-11-04 MED ORDER — SODIUM CHLORIDE 0.9 % IR SOLN
Status: DC | PRN
  Administered 2015-11-04: 1000 mL
  Administered 2015-11-04: 3000 mL

## 2015-11-04 MED ORDER — LACTATED RINGERS IV SOLN
INTRAVENOUS | Status: DC
  Administered 2015-11-04 (×3): via INTRAVENOUS

## 2015-11-04 MED ORDER — CHLORHEXIDINE GLUCONATE 4 % EX LIQD: 60.0000 mL | Freq: Once | CUTANEOUS | Status: DC

## 2015-11-04 MED ORDER — PHENYLEPHRINE HCL 10 MG/ML IJ SOLN
10.0000 mg | INTRAVENOUS | Status: DC | PRN
  Administered 2015-11-04: 25 ug/min via INTRAVENOUS

## 2015-11-04 MED ORDER — ALBUMIN HUMAN 5 % IV SOLN
INTRAVENOUS | Status: DC | PRN
  Administered 2015-11-04: 14:00:00 via INTRAVENOUS

## 2015-11-04 MED ORDER — FENTANYL CITRATE (PF) 100 MCG/2ML IJ SOLN
50.0000 ug | INTRAMUSCULAR | Status: DC | PRN
  Administered 2015-11-04: 50 ug via INTRAVENOUS

## 2015-11-04 MED ORDER — OXYCODONE HCL ER 10 MG PO T12A
10.0000 mg | EXTENDED_RELEASE_TABLET | Freq: Two times a day (BID) | ORAL | Status: DC
  Administered 2015-11-04 – 2015-11-08 (×8): 10 mg via ORAL
  Filled 2015-11-04 (×8): qty 1

## 2015-11-04 MED ORDER — POVIDONE-IODINE 10 % EX SOLN
CUTANEOUS | Status: DC | PRN
  Administered 2015-11-04: 1 via TOPICAL

## 2015-11-04 MED ORDER — CEFAZOLIN SODIUM-DEXTROSE 2-4 GM/100ML-% IV SOLN
2.0000 g | Freq: Four times a day (QID) | INTRAVENOUS | Status: AC
  Administered 2015-11-04 (×2): 2 g via INTRAVENOUS
  Filled 2015-11-04 (×2): qty 100

## 2015-11-04 MED ORDER — MELOXICAM 7.5 MG PO TABS
15.0000 mg | ORAL_TABLET | Freq: Every day | ORAL | Status: DC
  Administered 2015-11-04 – 2015-11-08 (×5): 15 mg via ORAL
  Filled 2015-11-04 (×5): qty 2

## 2015-11-04 MED ORDER — CEFAZOLIN SODIUM-DEXTROSE 2-4 GM/100ML-% IV SOLN
2.0000 g | INTRAVENOUS | Status: AC
  Administered 2015-11-04: 2 g via INTRAVENOUS
  Filled 2015-11-04: qty 100

## 2015-11-04 MED ORDER — BUPIVACAINE IN DEXTROSE 0.75-8.25 % IT SOLN
INTRATHECAL | Status: DC | PRN
  Administered 2015-11-04: 2 mL via INTRATHECAL

## 2015-11-04 MED ORDER — LIDOCAINE 2% (20 MG/ML) 5 ML SYRINGE
INTRAMUSCULAR | Status: AC
  Filled 2015-11-04: qty 5

## 2015-11-04 MED ORDER — ONDANSETRON HCL 4 MG/2ML IJ SOLN
INTRAMUSCULAR | Status: DC | PRN
  Administered 2015-11-04: 4 mg via INTRAVENOUS

## 2015-11-04 MED ORDER — ONDANSETRON HCL 4 MG/2ML IJ SOLN
4.0000 mg | Freq: Once | INTRAMUSCULAR | Status: DC | PRN
Start: 2015-11-04 — End: 2015-11-04

## 2015-11-04 MED ORDER — FENTANYL CITRATE (PF) 100 MCG/2ML IJ SOLN
INTRAMUSCULAR | Status: AC
  Administered 2015-11-04: 50 ug via INTRAVENOUS
  Filled 2015-11-04: qty 2

## 2015-11-04 MED ORDER — ROCURONIUM BROMIDE 50 MG/5ML IV SOLN
INTRAVENOUS | Status: AC
  Filled 2015-11-04: qty 2

## 2015-11-04 MED ORDER — ACETAMINOPHEN 500 MG PO TABS
1000.0000 mg | ORAL_TABLET | Freq: Four times a day (QID) | ORAL | Status: AC
  Administered 2015-11-04: 500 mg via ORAL
  Administered 2015-11-05 (×3): 1000 mg via ORAL
  Filled 2015-11-04 (×5): qty 2

## 2015-11-04 MED ORDER — PROPOFOL 500 MG/50ML IV EMUL
INTRAVENOUS | Status: DC | PRN
Start: 2015-11-04 — End: 2015-11-04
  Administered 2015-11-04: 100 ug/kg/min via INTRAVENOUS

## 2015-11-04 MED ORDER — PROPOFOL 10 MG/ML IV BOLUS
INTRAVENOUS | Status: AC
  Filled 2015-11-04: qty 40

## 2015-11-04 MED ORDER — ONDANSETRON HCL 4 MG PO TABS: 4.0000 mg | ORAL_TABLET | Freq: Three times a day (TID) | ORAL | Status: DC | PRN

## 2015-11-04 MED ORDER — MORPHINE SULFATE (PF) 2 MG/ML IV SOLN: 1.0000 mg | INTRAVENOUS | Status: DC | PRN

## 2015-11-04 MED ORDER — BUPIVACAINE-EPINEPHRINE (PF) 0.5% -1:200000 IJ SOLN
INTRAMUSCULAR | Status: DC | PRN
  Administered 2015-11-04: 30 mL via PERINEURAL

## 2015-11-04 MED ORDER — PHENYLEPHRINE HCL 10 MG/ML IJ SOLN
INTRAMUSCULAR | Status: AC
  Filled 2015-11-04: qty 2

## 2015-11-04 MED ORDER — DEXAMETHASONE SODIUM PHOSPHATE 10 MG/ML IJ SOLN
10.0000 mg | Freq: Once | INTRAMUSCULAR | Status: AC
  Administered 2015-11-04 – 2015-11-05 (×2): 10 mg via INTRAVENOUS
  Filled 2015-11-04: qty 1

## 2015-11-04 MED ORDER — PHENYLEPHRINE 40 MCG/ML (10ML) SYRINGE FOR IV PUSH (FOR BLOOD PRESSURE SUPPORT)
PREFILLED_SYRINGE | INTRAVENOUS | Status: AC
  Filled 2015-11-04: qty 40

## 2015-11-04 MED ORDER — ONDANSETRON HCL 4 MG PO TABS: 4.0000 mg | ORAL_TABLET | Freq: Four times a day (QID) | ORAL | Status: DC | PRN

## 2015-11-04 MED ORDER — PHENYLEPHRINE HCL 10 MG/ML IJ SOLN
INTRAMUSCULAR | Status: DC | PRN
  Administered 2015-11-04: 40 ug via INTRAVENOUS
  Administered 2015-11-04: 120 ug via INTRAVENOUS
  Administered 2015-11-04: 40 ug via INTRAVENOUS
  Administered 2015-11-04: 80 ug via INTRAVENOUS
  Administered 2015-11-04: 40 ug via INTRAVENOUS
  Administered 2015-11-04: 80 ug via INTRAVENOUS
  Administered 2015-11-04 (×3): 40 ug via INTRAVENOUS
  Administered 2015-11-04: 80 ug via INTRAVENOUS
  Administered 2015-11-04: 40 ug via INTRAVENOUS

## 2015-11-04 MED ORDER — PROPOFOL 1000 MG/100ML IV EMUL
INTRAVENOUS | Status: AC
  Filled 2015-11-04: qty 100

## 2015-11-04 MED ORDER — MIDAZOLAM HCL 5 MG/5ML IJ SOLN
INTRAMUSCULAR | Status: DC | PRN
  Administered 2015-11-04: 2 mg via INTRAVENOUS

## 2015-11-04 MED ORDER — METHOCARBAMOL 500 MG PO TABS
500.0000 mg | ORAL_TABLET | Freq: Four times a day (QID) | ORAL | Status: DC | PRN
  Administered 2015-11-04 – 2015-11-08 (×15): 500 mg via ORAL
  Filled 2015-11-04 (×14): qty 1

## 2015-11-04 MED ORDER — VITAMIN D (ERGOCALCIFEROL) 1.25 MG (50000 UNIT) PO CAPS
50000.0000 [IU] | ORAL_CAPSULE | ORAL | Status: DC
  Administered 2015-11-08: 50000 [IU] via ORAL
  Filled 2015-11-04: qty 1

## 2015-11-04 MED ORDER — BUPIVACAINE LIPOSOME 1.3 % IJ SUSP
20.0000 mL | INTRAMUSCULAR | Status: DC
  Filled 2015-11-04: qty 20

## 2015-11-04 MED ORDER — ACETAMINOPHEN 325 MG PO TABS: 650.0000 mg | ORAL_TABLET | Freq: Four times a day (QID) | ORAL | Status: DC | PRN

## 2015-11-04 MED ORDER — FENTANYL CITRATE (PF) 100 MCG/2ML IJ SOLN
25.0000 ug | INTRAMUSCULAR | Status: DC | PRN
  Administered 2015-11-04: 50 ug via INTRAVENOUS
  Administered 2015-11-04: 100 ug via INTRAVENOUS

## 2015-11-04 MED ORDER — MIDAZOLAM HCL 2 MG/2ML IJ SOLN
INTRAMUSCULAR | Status: AC
  Filled 2015-11-04: qty 2

## 2015-11-04 MED ORDER — DIPHENHYDRAMINE HCL 12.5 MG/5ML PO ELIX: 25.0000 mg | ORAL_SOLUTION | ORAL | Status: DC | PRN

## 2015-11-04 MED ORDER — POLYETHYLENE GLYCOL 3350 17 G PO PACK: 17.0000 g | PACK | Freq: Every day | ORAL | Status: DC | PRN

## 2015-11-04 MED ORDER — METOPROLOL TARTRATE 5 MG/5ML IV SOLN
INTRAVENOUS | Status: AC
  Filled 2015-11-04: qty 5

## 2015-11-04 MED ORDER — FENTANYL CITRATE (PF) 100 MCG/2ML IJ SOLN
INTRAMUSCULAR | Status: AC
  Administered 2015-11-04: 100 ug via INTRAVENOUS
  Filled 2015-11-04: qty 2

## 2015-11-04 MED ORDER — OXYCODONE HCL ER 10 MG PO T12A: 10.0000 mg | EXTENDED_RELEASE_TABLET | Freq: Two times a day (BID) | ORAL | Status: DC

## 2015-11-04 SURGICAL SUPPLY — 66 items
ALCOHOL ISOPROPYL (RUBBING) (MISCELLANEOUS) ×3 IMPLANT
BAG DECANTER FOR FLEXI CONT (MISCELLANEOUS) ×3 IMPLANT
BANDAGE ELASTIC 6 VELCRO ST LF (GAUZE/BANDAGES/DRESSINGS) ×6 IMPLANT
BANDAGE ESMARK 6X9 LF (GAUZE/BANDAGES/DRESSINGS) ×1 IMPLANT
BENZOIN TINCTURE PRP APPL 2/3 (GAUZE/BANDAGES/DRESSINGS) ×3 IMPLANT
BLADE SAW SGTL 13.0X1.19X90.0M (BLADE) ×3 IMPLANT
BNDG COHESIVE 6X5 TAN STRL LF (GAUZE/BANDAGES/DRESSINGS) ×6 IMPLANT
BNDG ESMARK 6X9 LF (GAUZE/BANDAGES/DRESSINGS) ×3
BONE CEMENT PALACOS R-G (Orthopedic Implant) ×6 IMPLANT
BOWL SMART MIX CTS (DISPOSABLE) ×3 IMPLANT
CAP KNEE TOTAL 3 ×3 IMPLANT
CEMENT BONE PALACOS R-G (Orthopedic Implant) ×2 IMPLANT
CLOSURE STERI-STRIP 1/2X4 (GAUZE/BANDAGES/DRESSINGS) ×1
CLSR STERI-STRIP ANTIMIC 1/2X4 (GAUZE/BANDAGES/DRESSINGS) ×2 IMPLANT
COVER SURGICAL LIGHT HANDLE (MISCELLANEOUS) ×3 IMPLANT
CUFF TOURNIQUET SINGLE 34IN LL (TOURNIQUET CUFF) ×3 IMPLANT
CUFF TOURNIQUET SINGLE 44IN (TOURNIQUET CUFF) IMPLANT
DRAPE EXTREMITY T 121X128X90 (DRAPE) ×3 IMPLANT
DRAPE INCISE IOBAN 66X45 STRL (DRAPES) ×3 IMPLANT
DRAPE ORTHO SPLIT 77X108 STRL (DRAPES) ×6
DRAPE PROXIMA HALF (DRAPES) ×3 IMPLANT
DRAPE SURG 17X11 SM STRL (DRAPES) ×6 IMPLANT
DRAPE SURG ORHT 6 SPLT 77X108 (DRAPES) ×3 IMPLANT
DRSG AQUACEL AG ADV 3.5X14 (GAUZE/BANDAGES/DRESSINGS) ×3 IMPLANT
DURAPREP 26ML APPLICATOR (WOUND CARE) ×9 IMPLANT
ELECT CAUTERY BLADE 6.4 (BLADE) ×3 IMPLANT
ELECT REM PT RETURN 9FT ADLT (ELECTROSURGICAL) ×3
ELECTRODE REM PT RTRN 9FT ADLT (ELECTROSURGICAL) ×1 IMPLANT
EVACUATOR 1/8 PVC DRAIN (DRAIN) IMPLANT
FACESHIELD WRAPAROUND (MASK) ×3 IMPLANT
GLOVE SURG SS PI 8.0 STRL IVOR (GLOVE) ×9 IMPLANT
GLOVE SURG SYN 7.5  E (GLOVE) ×4
GLOVE SURG SYN 7.5 E (GLOVE) ×2 IMPLANT
GOWN STRL REIN XL XLG (GOWN DISPOSABLE) ×9 IMPLANT
GOWN STRL REUS W/TWL 2XL LVL3 (GOWN DISPOSABLE) ×3 IMPLANT
HANDPIECE INTERPULSE COAX TIP (DISPOSABLE) ×2
KIT BASIN OR (CUSTOM PROCEDURE TRAY) ×3 IMPLANT
KIT ROOM TURNOVER OR (KITS) ×3 IMPLANT
MANIFOLD NEPTUNE II (INSTRUMENTS) ×3 IMPLANT
NEEDLE SPNL 18GX3.5 QUINCKE PK (NEEDLE) ×3 IMPLANT
NS IRRIG 1000ML POUR BTL (IV SOLUTION) ×3 IMPLANT
PACK TOTAL JOINT (CUSTOM PROCEDURE TRAY) ×3 IMPLANT
PAD ARMBOARD 7.5X6 YLW CONV (MISCELLANEOUS) ×6 IMPLANT
PEN SKIN MARKING BROAD (MISCELLANEOUS) ×3 IMPLANT
SAW OSC TIP CART 19.5X105X1.3 (SAW) ×3 IMPLANT
SEALER BIPOLAR AQUA 6.0 (INSTRUMENTS) IMPLANT
SET HNDPC FAN SPRY TIP SCT (DISPOSABLE) ×1 IMPLANT
SPONGE LAP 18X18 X RAY DECT (DISPOSABLE) ×3 IMPLANT
STAPLER VISISTAT 35W (STAPLE) IMPLANT
SUCTION FRAZIER HANDLE 10FR (MISCELLANEOUS)
SUCTION TUBE FRAZIER 10FR DISP (MISCELLANEOUS) IMPLANT
SUT ETHILON 2 0 FS 18 (SUTURE) ×6 IMPLANT
SUT MNCRL AB 4-0 PS2 18 (SUTURE) ×3 IMPLANT
SUT VIC AB 0 CT1 27 (SUTURE) ×4
SUT VIC AB 0 CT1 27XBRD ANBCTR (SUTURE) ×2 IMPLANT
SUT VIC AB 1 CTX 18 (SUTURE) ×6 IMPLANT
SUT VIC AB 1 CTX 27 (SUTURE) ×3 IMPLANT
SUT VIC AB 2-0 CT1 27 (SUTURE) ×8
SUT VIC AB 2-0 CT1 TAPERPNT 27 (SUTURE) ×4 IMPLANT
SYR 20CC LL (SYRINGE) ×3 IMPLANT
SYR 50ML LL SCALE MARK (SYRINGE) ×3 IMPLANT
TOWEL OR 17X24 6PK STRL BLUE (TOWEL DISPOSABLE) ×3 IMPLANT
TOWEL OR 17X26 10 PK STRL BLUE (TOWEL DISPOSABLE) ×3 IMPLANT
TRAY CATH 16FR W/PLASTIC CATH (SET/KITS/TRAYS/PACK) ×3 IMPLANT
WATER STERILE IRR 1000ML POUR (IV SOLUTION) ×6 IMPLANT
WRAP KNEE MAXI GEL POST OP (GAUZE/BANDAGES/DRESSINGS) ×3 IMPLANT

## 2015-11-04 NOTE — Discharge Instructions (Signed)

## 2015-11-04 NOTE — Anesthesia Preprocedure Evaluation (Addendum)
Anesthesia Evaluation  Patient identified by MRN, date of birth, ID band Patient awake    Reviewed: Allergy & Precautions, NPO status , Patient's Chart, lab work & pertinent test results  Airway Mallampati: II  TM Distance: >3 FB Neck ROM: Limited   Comment: C-collar in place  Dental  (+) Teeth Intact, Dental Advisory Given   Pulmonary Current Smoker (1 pack per week),    Pulmonary exam normal breath sounds clear to auscultation       Cardiovascular (-) hypertension(-) angina(-) Past MI negative cardio ROS Normal cardiovascular exam Rhythm:Regular Rate:Normal     Neuro/Psych PSYCHIATRIC DISORDERS Depression Myelopathy, spondylogenic, cervical S/p ACDF 10/16--wearing C-collar for numbness, weakness in bilateral upper extremities.    GI/Hepatic Neg liver ROS, GERD  ,  Endo/Other  negative endocrine ROS  Renal/GU negative Renal ROS     Musculoskeletal  (+) Arthritis , Osteoarthritis,    Abdominal   Peds  Hematology  (+) Blood dyscrasia, anemia , Plt 241k   Anesthesia Other Findings Day of surgery medications reviewed with the patient.  Reproductive/Obstetrics                            Anesthesia Physical Anesthesia Plan  ASA: III  Anesthesia Plan: Spinal and MAC   Post-op Pain Management:  Regional for Post-op pain   Induction:   Airway Management Planned:   Additional Equipment:   Intra-op Plan:   Post-operative Plan:   Informed Consent: I have reviewed the patients History and Physical, chart, labs and discussed the procedure including the risks, benefits and alternatives for the proposed anesthesia with the patient or authorized representative who has indicated his/her understanding and acceptance.   Dental advisory given  Plan Discussed with: CRNA, Anesthesiologist and Surgeon  Anesthesia Plan Comments: (Discussed risks and benefits of and differences between spinal and  general. Discussed risks of spinal including headache, backache, failure, bleeding, infection, and nerve damage. Patient consents to spinal. Questions answered. Coagulation studies and platelet count acceptable.  Discussed risks and benefits of adductor canal block including failure, bleeding, infection, nerve damage, weakness. Discussed that the block may not prevent all of the pain in the knee. Questions answered. Patient consents to block. )        Anesthesia Quick Evaluation

## 2015-11-04 NOTE — Transfer of Care (Signed)
Immediate Anesthesia Transfer of Care Note  Patient: Gary Davis  Procedure(s) Performed: Procedure(s): RIGHT TOTAL KNEE ARTHROPLASTY (Right)  Patient Location: PACU  Anesthesia Type:MAC and Spinal  Level of Consciousness: awake, alert , oriented and patient cooperative  Airway & Oxygen Therapy: Patient Spontanous Breathing  Post-op Assessment: Report given to RN and Post -op Vital signs reviewed and stable  Post vital signs: Reviewed and stable  Last Vitals:  Filed Vitals:   11/04/15 1120 11/04/15 1125  BP: 115/52 119/82  Pulse: 51 65  Temp:    Resp: 14 20    Last Pain:  Filed Vitals:   11/04/15 1129  PainSc: 10-Worst pain ever      Patients Stated Pain Goal: 4 (11/04/15 1028)  Complications: No apparent anesthesia complications

## 2015-11-04 NOTE — Op Note (Signed)
Total Knee Arthroplasty Procedure Note VANDEN FAWAZ [id number] 11/04/2015   Preoperative diagnosis: Right knee osteoarthritis  Postoperative diagnosis:same  Operative procedure: Right total knee arthroplasty. CPT [phone]  Surgeon: N. Glee Arvin, MD  Assistants: Wilson Singer, RNFA  Anesthesia: Spinal, regional  Tourniquet time: less than 2 hrs  Implants used: [employer] Journey II BCS Femur: 7 Tibia: 7 Patella: 35 mm, 7.5 thick Polyethylene: 10 mm  Indication: Gary Davis is a 60 y.o. year old male with a history of knee pain. Having failed conservative management, the patient elected to proceed with a total knee arthroplasty.  We have reviewed the risk and benefits of the surgery and they elected to proceed after voicing understanding.  Procedure:  After informed consent was obtained and understanding of the risk were voiced including but not limited to bleeding, infection, damage to surrounding structures including nerves and vessels, blood clots, leg length inequality and the failure to achieve desired results, the operative extremity was marked with verbal confirmation of the patient in the holding area.   The patient was then brought to the operating room and transported to the operating room table in the supine position.  A tourniquet was applied to the operative extremity around the upper thigh. The operative limb was then prepped and draped in the usual sterile fashion and preoperative antibiotics were administered.  A time out was performed prior to the start of surgery confirming the correct extremity, preoperative antibiotic administration, as well as team members, implants and instruments available for the case. Correct surgical site was also confirmed with preoperative radiographs. The limb was then elevated for exsanguination and the tourniquet was inflated. A midline incision was made and a standard medial parapatellar approach was performed.  The  patella was prepared and sized to a 35 mm.  A cover was placed on the patella for protection from retractors.  We then turned our attention to the femur. Posterior cruciate ligament was sacrificed. Start site was drilled in the femur and the intramedullary distal femoral cutting guide was placed, set at 5 degrees valgus, taking 9 mm of distal resection. The distal cut was made. Osteophytes were then removed. Next, the proximal tibial cutting guide was placed with appropriate slope, varus/valgus alignment and depth of resection. The proximal tibial cut was made. Gap blocks were then used to assess the extension gap and alignment, and appropriate soft tissue releases were performed. Attention was turned back to the femur, which was sized using the sizing guide to a size 7. Appropriate rotation of the femoral component was determined using epicondylar axis, Whiteside's line, and assessing the flexion gap under ligament tension. The appropriate size 4-in-1 cutting block was placed and cuts were made. Posterior femoral osteophytes and uncapped bone were then removed with the curved osteotome. The tibia was sized for a size 7 component. The femoral box-cutting guide was placed and prepared for a PS femoral component. Trial components were placed, and stability was checked in full extension, mid-flexion, and deep flexion. Proper tibial rotation was determined and marked.  The patella tracked well without a lateral release. Trial components were then removed and tibial preparation performed. A posterior capsular injection comprising of 20 cc of 1.3% exparel and 40 cc of normal saline was performed for postoperative pain control. The bony surfaces were irrigated with a pulse lavage and then dried. Bone cement was vacuum mixed on the back table, and the final components sized above were cemented into place. After cement had finished curing,  excess cement was removed. The stability of the construct was re-evaluated  throughout a range of motion and found to be acceptable. The trial liner was removed, the knee was copiously irrigated, and the knee was re-evaluated for any excess bone debris. The real polyethylene liner, 10 mm thick, was inserted and checked to ensure the locking mechanism had engaged appropriately. The tourniquet was deflated and hemostasis was achieved. The wound was irrigated with dilute betadine in normal saline, and then again with normal saline. A drain was not placed. Capsular closure was performed with a #1 vicryl, subcutaneous fat closed with a 2.0 vicryl suture, then subcutaneous tissue closed with interrupted 2.0 vicryl suture. The skin was then closed with a 4.0 monocryl. A sterile dressing was applied.   The patient was awakened in the operating room and taken to recovery in stable condition. All sponge, needle, and instrument counts were correct at the end of the case.  Position: supine  Complications: none.  Time Out: performed   Drains/Packing: none  Estimated blood loss: 50  Returned to Recovery Room: in good condition.   Antibiotics: yes   Mechanical VTE (DVT) Prophylaxis: sequential compression devices, TED thigh-high  Chemical VTE (DVT) Prophylaxis: aspirin  Fluid Replacement  Crystalloid: see anesthesia record Blood: none  FFP: none   Specimens Removed: 1 to pathology   Sponge and Instrument Count Correct? yes   PACU: portable radiograph - knee AP and Lateral   Admission: inpatient status, start PT & OT POD#1  Plan/RTC: Return in 2 weeks for wound check.   Weight Bearing/Load Lower Extremity: full   N. Glee ArvinMichael Meldon Hanzlik, MD [facility] [phone] 2:37 PM

## 2015-11-04 NOTE — H&P (Signed)
PREOPERATIVE H&P  Chief Complaint: Right knee osteoarthritis  HPI: Gary Davis is a 60 y.o. male who presents for surgical treatment of Right knee osteoarthritis.  He denies any changes in medical history.  Past Medical History  Diagnosis Date  . Arthritis   . Cataract   . Alcohol abuse   . Depression   . GERD (gastroesophageal reflux disease)    Past Surgical History  Procedure Laterality Date  . Arthroscopic repair acl Right   . Orif ankle fracture Right   . Finger fracture surgery      Right 4th Finger  . Posterior cervical fusion/foraminotomy N/A 02/09/2014    Procedure: POSTERIOR CERVICAL FUSION/FORAMINOTOMY LEVEL 4  Cervical three to seven decompression and fusion with lateral mass screws;  Surgeon: Mariam Dollar, MD;  Location: [facility];  Service: Neurosurgery;  Laterality: N/A;  . Back surgery    . Fracture surgery    . Posterior cervical fusion/foraminotomy N/A 04/15/2015    Procedure: Posterior cervical fusion C6-T1 with instrumentation and arthrodesis, decompressive laminectomy C7-T1, Foraminotomy C8-T1;  Surgeon: Donalee Citrin, MD;  Location: [facility];  Service: Neurosurgery;  Laterality: N/A;   Social History   Social History  . Marital Status: Single    Spouse Name: N/A  . Number of Children: N/A  . Years of Education: N/A   Social History Main Topics  . Smoking status: Current Every Day Smoker -- 0.33 packs/day for 15 years    Types: Cigarettes  . Smokeless tobacco: Not on file  . Alcohol Use: 3.6 oz/week    6 Cans of beer per week     Comment: daily  . Drug Use: No  . Sexual Activity: Yes   Other Topics Concern  . Not on file   Social History Narrative   Family History  Problem Relation Age of Onset  . Cancer Mother     breast cancer   Allergies  Allergen Reactions  . Hydrocodone Itching and Nausea And Vomiting  . Percocet [Oxycodone-Acetaminophen] Nausea And Vomiting    Can take oxycodone without tylenol additive  . Tramadol  Nausea Only   Prior to Admission medications   Medication Sig Start Date End Date Taking? Authorizing Provider  baclofen (LIORESAL) 10 MG tablet Take 10 mg by mouth 3 (three) times daily. 10/20/15  Yes Historical Provider, MD  gabapentin (NEURONTIN) 300 MG capsule TAKE 1 CAPSULE (300 MG TOTAL) BY MOUTH 3 (THREE) TIMES DAILY. 06/29/15  Yes Ambrose Finland, NP  meloxicam (MOBIC) 15 MG tablet Take 1 tablet (15 mg total) by mouth daily. 09/24/15  Yes Ambrose Finland, NP  Oxycodone HCl 20 MG TABS Take 1 tablet by mouth every 6 (six) hours as needed (pain).   Yes Historical Provider, MD  pantoprazole (PROTONIX) 40 MG tablet Take 1 tablet (40 mg total) by mouth daily. 06/29/15  Yes Ambrose Finland, NP  Vitamin D, Ergocalciferol, (DRISDOL) 50000 units CAPS capsule Take 1 capsule (50,000 Units total) by mouth every 7 (seven) days. 09/28/15  Yes Ambrose Finland, NP     Positive ROS: All other systems have been reviewed and were otherwise negative with the exception of those mentioned in the HPI and as above.  Physical Exam: General: Alert, no acute distress Cardiovascular: No pedal edema Respiratory: No cyanosis, no use of accessory musculature GI: abdomen soft Skin: No lesions in the area of chief complaint Neurologic: Sensation intact distally Psychiatric: Patient is competent for consent with normal mood and affect  Lymphatic: no lymphedema  MUSCULOSKELETAL: exam stable  Assessment: Right knee osteoarthritis  Plan: Plan for Procedure(s): RIGHT TOTAL KNEE ARTHROPLASTY  The risks benefits and alternatives were discussed with the patient including but not limited to the risks of nonoperative treatment, versus surgical intervention including infection, bleeding, nerve injury,  blood clots, cardiopulmonary complications, morbidity, mortality, among others, and they were willing to proceed.   Cheral AlmasXu, Savanna Dooley Kym, MD   11/04/2015 7:12 AM

## 2015-11-04 NOTE — Anesthesia Procedure Notes (Addendum)
Anesthesia Regional Block:  Adductor canal block  Pre-Anesthetic Checklist: ,, timeout performed, Correct Patient, Correct Site, Correct Laterality, Correct Procedure, Correct Position, site marked, Risks and benefits discussed,  Surgical consent,  Pre-op evaluation,  At surgeon's request and post-op pain management  Laterality: Right  Prep: chloraprep       Needles:  Injection technique: Single-shot  Needle Type: Echogenic Needle     Needle Length: 9cm 9 cm Needle Gauge: 21 and 21 G    Additional Needles:  Procedures: ultrasound guided (picture in chart) Adductor canal block Narrative:  Injection made incrementally with aspirations every 5 mL.  Performed by: Personally  Anesthesiologist: Cecile HearingURK, Iris Hairston EDWARD  Additional Notes: No pain on injection. No increased resistance to injection. Injection made in 5cc increments.  Good needle visualization.  Patient tolerated procedure well.   Spinal Patient location during procedure: OR Staffing Anesthesiologist: Cecile HearingURK, Ivalene Platte EDWARD Performed by: anesthesiologist  Preanesthetic Checklist Completed: patient identified, surgical consent, pre-op evaluation, timeout performed, IV checked, risks and benefits discussed and monitors and equipment checked Spinal Block Patient position: sitting Prep: site prepped and draped and DuraPrep Patient monitoring: continuous pulse ox and blood pressure Approach: midline Location: L3-4 Needle Needle type: Whitacre  Needle gauge: 22 G Needle length: 9 cm Additional Notes Functioning IV was confirmed and monitors were applied. Sterile prep and drape, including hand hygiene, mask and sterile gloves were used. The patient was positioned and the spine was prepped. The skin was anesthetized with lidocaine.  Free flow of clear CSF was obtained prior to injecting local anesthetic into the CSF.  The spinal needle aspirated freely following injection.  The needle was carefully withdrawn.  The patient  tolerated the procedure well. Consent was obtained prior to procedure with all questions answered and concerns addressed. Risks including but not limited to bleeding, infection, nerve damage, paralysis, failed block, inadequate analgesia, allergic reaction, high spinal, itching and headache were discussed and the patient wished to proceed.   Arrie AranStephen Ike Maragh, MD

## 2015-11-05 ENCOUNTER — Encounter (HOSPITAL_COMMUNITY): Payer: Self-pay | Admitting: Orthopaedic Surgery

## 2015-11-05 LAB — CBC
HCT: 33.5 % — ABNORMAL LOW (ref 39.0–52.0)
Hemoglobin: 11.1 g/dL — ABNORMAL LOW (ref 13.0–17.0)
MCH: 32 pg (ref 26.0–34.0)
MCHC: 33.1 g/dL (ref 30.0–36.0)
MCV: 96.5 fL (ref 78.0–100.0)
PLATELETS: 207 10*3/uL (ref 150–400)
RBC: 3.47 MIL/uL — ABNORMAL LOW (ref 4.22–5.81)
RDW: 12.8 % (ref 11.5–15.5)
WBC: 8.6 10*3/uL (ref 4.0–10.5)

## 2015-11-05 LAB — BASIC METABOLIC PANEL
ANION GAP: 9 (ref 5–15)
BUN: 8 mg/dL (ref 6–20)
CALCIUM: 8.6 mg/dL — AB (ref 8.9–10.3)
CO2: 24 mmol/L (ref 22–32)
Chloride: 103 mmol/L (ref 101–111)
Creatinine, Ser: 1.04 mg/dL (ref 0.61–1.24)
GFR calc Af Amer: >60 mL/min (ref >60)
GFR calc non Af Amer: >60 mL/min (ref >60)
GLUCOSE: 160 mg/dL — AB (ref 65–99)
Potassium: 4.6 mmol/L (ref 3.5–5.1)
SODIUM: 136 mmol/L (ref 135–145)

## 2015-11-05 MED ORDER — WHITE PETROLATUM GEL
Status: AC
  Administered 2015-11-05: 0.2
  Filled 2015-11-05: qty 1

## 2015-11-05 MED ORDER — DIPHENHYDRAMINE HCL 25 MG PO CAPS
25.0000 mg | ORAL_CAPSULE | Freq: Four times a day (QID) | ORAL | Status: DC | PRN
Start: 2015-11-05 — End: 2015-11-08

## 2015-11-05 MED ORDER — DEXAMETHASONE SODIUM PHOSPHATE 10 MG/ML IJ SOLN
INTRAMUSCULAR | Status: AC
  Administered 2015-11-05: 10 mg via INTRAVENOUS
  Filled 2015-11-05: qty 1

## 2015-11-05 NOTE — Anesthesia Postprocedure Evaluation (Signed)
Anesthesia Post Note  Patient: Gary FellsMichael D Russomanno  Procedure(s) Performed: Procedure(s) (LRB): RIGHT TOTAL KNEE ARTHROPLASTY (Right)  Patient location during evaluation: PACU Anesthesia Type: Spinal Level of consciousness: oriented and awake and alert Pain management: pain level controlled Vital Signs Assessment: post-procedure vital signs reviewed and stable Respiratory status: spontaneous breathing, respiratory function stable and patient connected to nasal cannula oxygen Cardiovascular status: blood pressure returned to baseline and stable Postop Assessment: no headache and no backache Anesthetic complications: no    Last Vitals:  Filed Vitals:   11/05/15 0434 11/05/15 1300  BP: 108/64 105/74  Pulse: 50 52  Temp: 36.3 C 36.8 C  Resp: 18 18    Last Pain:  Filed Vitals:   11/05/15 1334  PainSc: 8                  Shelton SilvasKevin D Hollis

## 2015-11-05 NOTE — Progress Notes (Signed)
Utilization review completed.  

## 2015-11-05 NOTE — Progress Notes (Signed)
   Subjective:  Patient reports pain as moderate.    Objective:   VITALS:   Filed Vitals:   11/04/15 1641 11/04/15 1951 11/05/15 0015 11/05/15 0434  BP: 107/65 108/72 112/68 108/64  Pulse: 50 60 62 50  Temp: 97.7 F (36.5 C) 98.2 F (36.8 C) 97.6 F (36.4 C) 97.4 F (36.3 C)  TempSrc: Oral Oral Oral Oral  Resp: 16 16 16 18   Height:      Weight:      SpO2: 100% 100% 100% 97%    Neurologically intact Neurovascular intact Sensation intact distally Intact pulses distally Dorsiflexion/Plantar flexion intact Incision: dressing C/D/I and no drainage No cellulitis present Compartment soft   Lab Results  Component Value Date   WBC 8.6 11/05/2015   HGB 11.1* 11/05/2015   HCT 33.5* 11/05/2015   MCV 96.5 11/05/2015   PLT 207 11/05/2015     Assessment/Plan:  1 Day Post-Op   - Expected postop acute blood loss anemia - will monitor for symptoms - Up with PT/OT - DVT ppx - SCDs, ambulation, aspirin - WBAT operative extremity - Pain control - Discharge planning - patient lives alone, may need SNF depending on PT eval  Gary Davis, Gary Davis 11/05/2015, 7:09 AM [phone]

## 2015-11-05 NOTE — Evaluation (Signed)
Occupational Therapy Evaluation Patient Details Name: SCOTT VANDERVEER MRN: [id number] DOB: 1956/04/06 Today's Date: 11/05/2015    History of Present Illness 60 yo admitted for Rt TKA. PMhx: cervical fusion (02/09/2014 by Dr.Cram pt still wearing cervical collar), GERD, depression   Clinical Impression   Pt admitted with the above diagnosis and has the deficits outlined below. Pt would benefit from cont OT to increase independence and safety with basic adls as well as adl mobility.  Pt's weakness after old neck surgery combined with new knee surgery makes him a fall/safety risk at home alone at this point.  Pt lives in an apartment alone and uses [employer] for transportation. Pt would benefit from further rehab before living at home alone again.  Spoke to Dr. Wynetta Emery re: pt's neck brace and he stated pt did not have to wear the neck brace any longer.    Follow Up Recommendations  SNF;Supervision/Assistance - 24 hour    Equipment Recommendations  3 in 1 bedside comode    Recommendations for Other Services       Precautions / Restrictions Precautions Precautions: Knee;Fall Precaution Comments: pt continues to wear cervical collar from old cervical surgery. Spoke with Dr. Wynetta Emery who stated he does not need to wear collar. Pt seems to choose to wear collar.  Did relay Dr. Dola Argyle information to the pt. Restrictions Weight Bearing Restrictions: Yes RLE Weight Bearing: Weight bearing as tolerated      Mobility Bed Mobility Overal bed mobility: Modified Independent             General bed mobility comments: with rail and HOB 30 degrees  Transfers Overall transfer level: Needs assistance Equipment used: Rolling walker (2 wheeled) Transfers: Sit to/from Stand Sit to Stand: Min guard         General transfer comment: Pt able to transfer to standing with no hands on assist from recliner but required 2 attempts.    Balance Overall balance assessment: Needs assistance Sitting-balance  support: Feet supported Sitting balance-Leahy Scale: Good     Standing balance support: Bilateral upper extremity supported;During functional activity Standing balance-Leahy Scale: Fair Standing balance comment: Pt stood with walker and was able to let go with one hand to manage some LE dressing while walker in front of him with no LOB.                            ADL Overall ADL's : Needs assistance/impaired Eating/Feeding: Set up;Sitting Eating/Feeding Details (indicate cue type and reason): pt needs some assist with cutting food and set up due to weak grip.  Pt can feed self without built up handles. Grooming: Set up;Sitting   Upper Body Bathing: Set up;Sitting Upper Body Bathing Details (indicate cue type and reason): extra time needed.  Pt can squeeze out washcoth but requires extra time for bathing.  Lower Body Bathing: Minimal assistance;Sit to/from stand Lower Body Bathing Details (indicate cue type and reason): min assist to reach R foot and to stand to wash back side. Upper Body Dressing : Set up;Sitting   Lower Body Dressing: Moderate assistance;Sit to/from stand Lower Body Dressing Details (indicate cue type and reason): Pt with difficulty donning R sock and shoe and starting pants over legs.  Techniques given to make dressing easier. Toilet Transfer: Minimal assistance;Ambulation;Comfort height toilet;RW;Grab bars Toilet Transfer Details (indicate cue type and reason): min assist for balance at times when walking to bathroom. Toileting- Architect and Hygiene: Min guard;Sit to/from  stand       Functional mobility during ADLs: Min guard General ADL Comments: Pt does well with adls considering his UE weakness combined with new knee replacement.  Pt lives alone therefore feel he will need some rehab before returning home alone.  Pt could also use OT to continue to attempt to evaluate BUE as well as improve adls.     Vision Vision Assessment?: No  apparent visual deficits   Perception     Praxis      Pertinent Vitals/Pain Pain Assessment: 0-10 Pain Score: 7  Pain Location: R knee. Pain Descriptors / Indicators: Aching Pain Intervention(s): Monitored during session;Repositioned;Ice applied     Hand Dominance Right   Extremity/Trunk Assessment Upper Extremity Assessment Upper Extremity Assessment: RUE deficits/detail;LUE deficits/detail RUE Deficits / Details: PROM WFL  Shoulder 4-/5, biceps 4-/5, triceps 4/5, hand 3+/5.  Pt has difficult time extending all fingers actively but can passively. RUE: Unable to fully assess due to pain RUE Sensation: decreased light touch;decreased proprioception RUE Coordination: decreased fine motor;decreased gross motor LUE Deficits / Details: Pr LUE: Unable to fully assess due to pain LUE Sensation: decreased light touch LUE Coordination: decreased fine motor;decreased gross motor   Lower Extremity Assessment Lower Extremity Assessment: Defer to PT evaluation RLE Deficits / Details: decreased strength and ROm as expected post op   Cervical / Trunk Assessment Cervical / Trunk Assessment: Other exceptions Cervical / Trunk Exceptions: forward head in cervical collar per pt request, pt able to don collar without assist   Communication Communication Communication: No difficulties   Cognition Arousal/Alertness: Awake/alert Behavior During Therapy: WFL for tasks assessed/performed Overall Cognitive Status: Within Functional Limits for tasks assessed                     General Comments       Exercises       Shoulder Instructions      Home Living Family/patient expects to be discharged to:: Private residence Living Arrangements: Alone Available Help at Discharge: Available PRN/intermittently;Friend(s) Type of Home: Apartment Home Access: Level entry     Home Layout: One level     Bathroom Shower/Tub: Tub/shower unit Shower/tub characteristics: Curtain Nurse, children'sBathroom  Toilet: Standard Bathroom Accessibility: Yes   Home Equipment: Cane - single point;Tub bench   Additional Comments: needs a 3:1      Prior Functioning/Environment Level of Independence: Independent with assistive device(s)        Comments: cane for ambulation, [employer] for transportation    OT Diagnosis: Generalized weakness;Acute pain   OT Problem List: Decreased strength;Decreased range of motion;Impaired balance (sitting and/or standing);Decreased coordination;Decreased knowledge of use of DME or AE;Impaired sensation;Impaired tone;Impaired UE functional use;Pain   OT Treatment/Interventions: Self-care/ADL training;Therapeutic exercise;Therapeutic activities;DME and/or AE instruction    OT Goals(Current goals can be found in the care plan section) Acute Rehab OT Goals Patient Stated Goal: play basketball and golf OT Goal Formulation: With patient Time For Goal Achievement: 11/19/15 Potential to Achieve Goals: Good ADL Goals Pt Will Perform Grooming: with supervision;standing Pt Will Perform Lower Body Bathing: with supervision;sit to/from stand Pt Will Perform Lower Body Dressing: with supervision;sit to/from stand Pt Will Perform Tub/Shower Transfer: Tub transfer;with supervision;tub bench;rolling walker;ambulating Additional ADL Goal #1: Pt will walk to bathroom and toilet with 3:1 over commode with supervision.  OT Frequency: Min 2X/week   Barriers to D/C: Decreased caregiver support  pt lives alone       Co-evaluation  End of Session Equipment Utilized During Treatment: Rolling walker;Cervical collar Nurse Communication: Mobility status  Activity Tolerance: Patient tolerated treatment well Patient left: in chair;with call bell/phone within reach   Time: 1025-1046 OT Time Calculation (min): 21 min Charges:  OT General Charges $OT Visit: 1 Procedure OT Evaluation $OT Eval Moderate Complexity: 1 Procedure G-Codes:    Hope Budds 2015-11-07, 11:04 AM  [phone]

## 2015-11-05 NOTE — Evaluation (Signed)
Physical Therapy Evaluation Patient Details Name: Gary Davis MRN: [id number] DOB: 01-31-1956 Today's Date: 11/05/2015   History of Present Illness  60 yo admitted for Rt TKA. PMhx: cervical fusion (02/09/2014 by Dr.Cram pt still wearing cervical collar), GERD, depression  Clinical Impression  Pt pleasant and willing to work with therapy. Pt with decreased strength, ROM, activity tolerance, who would benefit from therapy to maximize his independence, improve quality of life, and decrease caregiver burden. Educated pt on foam roll use, transfers, HEP, ambulation with RW. Discussed follow up recommendation and recommended SNF due to decreased caregiver support, hx of falls, and bil UE weakness.    Follow Up Recommendations SNF;Supervision for mobility/OOB (unless pt is able to achieve mod independent by discharge)    Equipment Recommendations  Rolling walker with 5" wheels    Recommendations for Other Services OT consult     Precautions / Restrictions Precautions Precautions: Knee;Fall Precaution Comments: pt continues to wear cervical collar from sx in 2015 Restrictions Weight Bearing Restrictions: Yes RLE Weight Bearing: Weight bearing as tolerated      Mobility  Bed Mobility Overal bed mobility: Modified Independent             General bed mobility comments: with rail and HOB 30 degrees  Transfers Overall transfer level: Needs assistance   Transfers: Sit to/from Stand Sit to Stand: Min guard         General transfer comment: x2 trials from bed with pt unable to rise with first attempt. cues for hand placement. increased time needed.  Ambulation/Gait Ambulation/Gait assistance: Min guard Ambulation Distance (Feet): 150 Feet Assistive device: Rolling walker (2 wheeled) Gait Pattern/deviations: Step-to pattern;Trunk flexed;Decreased stride length   Gait velocity interpretation: Below normal speed for age/gender General Gait Details: cues for posture, and  increasing heel strike.   Stairs            Wheelchair Mobility    Modified Rankin (Stroke Patients Only)       Balance Overall balance assessment: Needs assistance Sitting-balance support: Bilateral upper extremity supported;No upper extremity supported Sitting balance-Leahy Scale: Good       Standing balance-Leahy Scale: Fair                               Pertinent Vitals/Pain Pain Assessment: 0-10 Pain Score: 10-Worst pain ever Pain Location: right knee Pain Descriptors / Indicators: Aching Pain Intervention(s): Limited activity within patient's tolerance;Premedicated before session;Repositioned    Home Living Family/patient expects to be discharged to:: Private residence Living Arrangements: Alone Available Help at Discharge: Available PRN/intermittently;Friend(s) Type of Home: Apartment Home Access: Level entry     Home Layout: One level Home Equipment: Cane - single point;Tub bench      Prior Function Level of Independence: Independent with assistive device(s)         Comments: cane for ambulation, SCAT for transportation     Hand Dominance        Extremity/Trunk Assessment   Upper Extremity Assessment: Generalized weakness (bil Ue weakness due to neck sx will defer to OT)           Lower Extremity Assessment: RLE deficits/detail RLE Deficits / Details: decreased strength and ROm as expected post op    Cervical / Trunk Assessment: Other exceptions  Communication   Communication: No difficulties  Cognition Arousal/Alertness: Awake/alert Behavior During Therapy: WFL for tasks assessed/performed Overall Cognitive Status: Within Functional Limits for tasks assessed  General Comments      Exercises Total Joint Exercises Quad Sets: AROM;Right;5 reps;Seated Heel Slides: Seated;5 reps;Right;AAROM Straight Leg Raises: AAROM;Right;5 reps;Seated      Assessment/Plan    PT Assessment  Patient needs continued PT services  PT Diagnosis Difficulty walking;Acute pain   PT Problem List Decreased strength;Decreased range of motion;Decreased activity tolerance;Decreased balance;Decreased mobility;Pain;Decreased knowledge of use of DME  PT Treatment Interventions DME instruction;Gait training;Functional mobility training;Therapeutic activities;Therapeutic exercise;Patient/family education   PT Goals (Current goals can be found in the Care Plan section) Acute Rehab PT Goals Patient Stated Goal: play basketball and golf PT Goal Formulation: With patient Time For Goal Achievement: 11/12/15 Potential to Achieve Goals: Good    Frequency 7X/week   Barriers to discharge Decreased caregiver support      Co-evaluation               End of Session Equipment Utilized During Treatment: Gait belt Activity Tolerance: Patient tolerated treatment well Patient left: in chair;with call bell/phone within reach Nurse Communication: Mobility status         Time: [id number] PT Time Calculation (min) (ACUTE ONLY): 35 min   Charges:   PT Evaluation $PT Eval Moderate Complexity: 1 Procedure PT Treatments $Gait Training: 8-22 mins   PT G CodesCherene Julian:        Cardarius Senat 11/05/2015, 9:34 AM   Cherene JulianPaola Quindarius Cabello, SPT [phone]

## 2015-11-05 NOTE — Progress Notes (Signed)
Physical Therapy Treatment Patient Details Name: Debbra RidingMichael D Jirak MRN: [id number] DOB: 04/20/1956 Today's Date: 11/05/2015    History of Present Illness 60 yo admitted for Rt TKA. PMhx: cervical fusion (02/09/2014 by Dr.Cram pt still wearing cervical collar), GERD, depression    PT Comments    Pt progressed with ROM and gait today, but still with decreased mobility and activity tolerance. Educated pt on keeping cervical collar off to regain neck strength, and pt agreeable to not wearing collar during session. Reemphasized foam roll use under heel. Will continue to follow.   Follow Up Recommendations  SNF;Supervision for mobility/OOB     Equipment Recommendations       Recommendations for Other Services       Precautions / Restrictions Precautions Precautions: Knee;Fall Weight Bearing Restrictions: Yes RLE Weight Bearing: Weight bearing as tolerated    Mobility  Bed Mobility             General bed mobility comments: pt in chair on arrival  Transfers Overall transfer level: Needs assistance Equipment used: Rolling walker (2 wheeled) Transfers: Sit to/from Stand Sit to Stand: Supervision         General transfer comment: increased time needed. pt able to stand without cues or assistance. supervision for safety.  Ambulation/Gait Ambulation/Gait assistance: Min guard Ambulation Distance (Feet): 200 Feet Assistive device: Rolling walker (2 wheeled) Gait Pattern/deviations: Step-through pattern;Trunk flexed;Decreased stride length   Gait velocity interpretation: Below normal speed for age/gender General Gait Details: initial cues for posture and increasing heel strike. right knee buckled once during ambulation, but pt able to maintain balance without assist.   Stairs            Wheelchair Mobility    Modified Rankin (Stroke Patients Only)       Balance                    Cognition Arousal/Alertness: Awake/alert Behavior During Therapy:  WFL for tasks assessed/performed Overall Cognitive Status: Within Functional Limits for tasks assessed                      Exercises Total Joint Exercises Quad Sets: 10 reps;Right;AROM;Seated Heel Slides: 10 reps;Right;AAROM;Seated Hip ABduction/ADduction: AROM;Seated;Right;10 reps Straight Leg Raises: 10 reps;Right;Seated;AROM Goniometric ROM: 0-70    General Comments      Pertinent Vitals/Pain Pain Assessment: 0-10 Pain Score: 10-Worst pain ever Pain Location: right knee Pain Descriptors / Indicators: Aching Pain Intervention(s): Limited activity within patient's tolerance;Monitored during session;Ice applied    Home Living     Prior Function    PT Goals (current goals can now be found in the care plan section) Acute Rehab PT Goals Patient Stated Goal: play basketball and golf Progress towards PT goals: Progressing toward goals    Frequency       PT Plan Current plan remains appropriate    Co-evaluation             End of Session Equipment Utilized During Treatment: Gait belt Activity Tolerance: Patient tolerated treatment well Patient left: in chair;with call bell/phone within reach     Time: 1232-1255 PT Time Calculation (min) (ACUTE ONLY): 23 min  Charges:  $Gait Training: 8-22 mins $Therapeutic Exercise: 8-22 mins                    G CodesCherene Julian:      Chaos Carlile 11/05/2015, 1:04 PM   Cherene JulianPaola Avelynn Sellin, SPT [phone]

## 2015-11-05 NOTE — Care Management Note (Signed)
Case Management Note  Patient Details  Name: Gary Davis MRN: [id number] Date of Birth: 03/19/1956  Subjective/Objective:            S/p right total knee arthroplasty        Action/Plan: PT and OT recommending SNF. Spoke with patient, he is agreeable to SNF. Contacted CSW , will continue to follow.   Expected Discharge Date:                  Expected Discharge Plan:  Skilled Nursing Facility  In-House Referral:  Clinical Social Work  Discharge planning Services  CM Consult  Post Acute Care Choice:    Choice offered to:     DME Arranged:    DME Agency:     HH Arranged:    HH Agency:     Status of Service:  In process, will continue to follow  Medicare Important Message Given:    Date Medicare IM Given:    Medicare IM give by:    Date Additional Medicare IM Given:    Additional Medicare Important Message give by:     If discussed at Long Length of Stay Meetings, dates discussed:    Additional Comments:  [name], Billye Nydam Watson, RN 11/05/2015, 1:58 PM

## 2015-11-06 NOTE — Progress Notes (Signed)
Patient ID: Gary RidingMichael D Otterson, male   DOB: 06/24/1956, 60 y.o.   MRN: [id number] Looks great today.  Currently ambulating around the unit with PT.  Balance a little better today.  His right knee dressing is clean and intact.  Likely discharge Monday.

## 2015-11-06 NOTE — Progress Notes (Signed)
Physical Therapy Treatment Patient Details Name: Gary Davis MRN: [id number] DOB: 03/05/1956 Today's Date: 11/06/2015    History of Present Illness 60 yo admitted for Rt TKA. PMhx: cervical fusion (02/09/2014 by Dr.Cram pt still wearing cervical collar), GERD, depression    PT Comments    Pt performed increased activity and improved gait pattern.  Pt required cues and min guard to maintain safety during gait sequencing.  Pt fatigues with increased gait and presents with decreased heel strike on R side.  Pt would continue to benefit from skilled rehab to improve strength and promote functional independence before return home.    Follow Up Recommendations  SNF;Supervision for mobility/OOB     Equipment Recommendations  Rolling walker with 5" wheels    Recommendations for Other Services OT consult     Precautions / Restrictions Precautions Precautions: Knee;Fall Precaution Comments: pt continues to wear cervical collar from old cervical surgery. Spoke with Dr. Wynetta Emeryram who stated he does not need to wear collar. Pt seems to choose to wear collar.  Did relay Dr. Dola Argylerams information to the pt. Restrictions Weight Bearing Restrictions: Yes RLE Weight Bearing: Weight bearing as tolerated    Mobility  Bed Mobility Overal bed mobility: Modified Independent             General bed mobility comments: Good technique to advance to edge of bed.    Transfers Overall transfer level: Needs assistance Equipment used: Rolling walker (2 wheeled) Transfers: Sit to/from Stand Sit to Stand: Supervision         General transfer comment: Pt remains to require increased time secondary to pain.  Pt required supervision for safety and cues for hand and foot placement.  Ambulation/Gait Ambulation/Gait assistance: Min guard Ambulation Distance (Feet): 200 Feet (x3 trials.  Pt required standing break due to fatigue.  ) Assistive device: Rolling walker (2 wheeled) Gait Pattern/deviations:  Step-through pattern;Trunk flexed;Decreased stride length;Decreased dorsiflexion - right   Gait velocity interpretation: Below normal speed for age/gender General Gait Details: initial cues for posture and increasing heel strike and quad contraction on RLE during stance phase to decreased risk of fall from buckling.  Pt able to focus and correct deviation wityh cues.  pt remains to require min guard for safety to reduce risk of fall and improve safety during activity.     Stairs            Wheelchair Mobility    Modified Rankin (Stroke Patients Only)       Balance                                    Cognition Arousal/Alertness: Awake/alert Behavior During Therapy: WFL for tasks assessed/performed Overall Cognitive Status: Within Functional Limits for tasks assessed                      Exercises Total Joint Exercises Ankle Circles/Pumps: AROM;Both;15 reps;Supine Quad Sets: AROM;Both;10 reps;Supine Heel Slides: AAROM;Right;10 reps;Supine Hip ABduction/ADduction: AROM;Right;10 reps;Supine Straight Leg Raises: AROM;Right;10 reps;Supine Knee Flexion: AROM;Right;5 reps (with 10 sec hold to improve knee flexion.  ) Goniometric ROM: 0-84 degrees.  R knee.      General Comments        Pertinent Vitals/Pain Pain Assessment: Faces Faces Pain Scale: Hurts a little bit Pain Descriptors / Indicators: Discomfort Pain Intervention(s): Repositioned;Monitored during session    Home Living  Prior Function            PT Goals (current goals can now be found in the care plan section) Acute Rehab PT Goals Patient Stated Goal: play basketball and golf Potential to Achieve Goals: Good Progress towards PT goals: Progressing toward goals    Frequency  7X/week    PT Plan Current plan remains appropriate    Co-evaluation             End of Session Equipment Utilized During Treatment: Gait belt Activity Tolerance:  Patient tolerated treatment well Patient left: in chair;with call bell/phone within reach     Time: 0900-0934 PT Time Calculation (min) (ACUTE ONLY): 34 min  Charges:  $Gait Training: 8-22 mins $Therapeutic Exercise: 8-22 mins                    G Codes:      Florestine Avers 2015-12-02, 11:01 AM Joycelyn Rua, PTA pager [phone]

## 2015-11-06 NOTE — Progress Notes (Signed)
Physical Therapy Treatment Patient Details Name: Gary RidingMichael D Hollingworth MRN: [id number] DOB: 03/27/1956 Today's Date: 11/06/2015    History of Present Illness 60 yo admitted for Rt TKA. PMhx: cervical fusion (02/09/2014 by Dr.Cram pt still wearing cervical collar), GERD, depression    PT Comments    Pt increased ambulation distance but remains to require min guard and would benefit from ST SNF stay to improve strength and functional mobility to return back to ADLs and walking without assistance.    Follow Up Recommendations  SNF;Supervision for mobility/OOB     Equipment Recommendations  Rolling walker with 5" wheels    Recommendations for Other Services       Precautions / Restrictions Precautions Precautions: Knee;Fall Restrictions Weight Bearing Restrictions: Yes RLE Weight Bearing: Weight bearing as tolerated    Mobility  Bed Mobility Overal bed mobility: Modified Independent             General bed mobility comments: Good technique to advance to edge of bed.    Transfers Overall transfer level: Needs assistance Equipment used: Rolling walker (2 wheeled)   Sit to Stand: Supervision         General transfer comment: Pt remains to require increased time secondary to pain.  Pt required supervision for safety and cues for hand and foot placement.  Ambulation/Gait Ambulation/Gait assistance: Min guard Ambulation Distance (Feet): 600 Feet Assistive device: Rolling walker (2 wheeled) Gait Pattern/deviations: Step-through pattern     General Gait Details: initial cues for posture and increasing heel strike and quad contraction on RLE during stance phase to decreased risk of fall from buckling.  Pt able to focus and correct deviation wityh cues.  pt remains to require min guard for safety to reduce risk of fall and improve safety during activity.     Stairs            Wheelchair Mobility    Modified Rankin (Stroke Patients Only)       Balance Overall  balance assessment: Needs assistance   Sitting balance-Leahy Scale: Good       Standing balance-Leahy Scale: Fair                      Cognition Arousal/Alertness: Awake/alert Behavior During Therapy: WFL for tasks assessed/performed Overall Cognitive Status: Within Functional Limits for tasks assessed                      Exercises      General Comments        Pertinent Vitals/Pain      Home Living                      Prior Function            PT Goals (current goals can now be found in the care plan section) Acute Rehab PT Goals Patient Stated Goal: play basketball and golf Potential to Achieve Goals: Good Progress towards PT goals: Progressing toward goals    Frequency  7X/week    PT Plan Current plan remains appropriate    Co-evaluation             End of Session Equipment Utilized During Treatment: Gait belt Activity Tolerance: Patient tolerated treatment well Patient left: in chair;with call bell/phone within reach     Time: [id number] PT Time Calculation (min) (ACUTE ONLY): 9 min  Charges:  $Gait Training: 8-22 mins  G Codes:      Florestine Avers 11/06/2015, 4:10 PM  Joycelyn Rua, PTA pager [phone]

## 2015-11-06 NOTE — Progress Notes (Signed)
SW order in place to assist patient with SNF placement per PT recommendation.  Patient noted, per PT note today to ambulate 600 ft with min guard assistance.  Patient is not appropriate referral for SNF.  RNCM  follow up with patient to determine any home care needs is recommended.   SW services will sign off

## 2015-11-07 NOTE — Progress Notes (Signed)
Security was called and they went in to assess the situation. Patient admitted smoking in his room and gave the cigarette lighter to them. The lighter has been passed on to the day shift nurse to be given to patient on discharge.

## 2015-11-07 NOTE — Progress Notes (Signed)
Physical Therapy Treatment Patient Details Name: Gary RidingMichael D Descoteaux MRN: [id number] DOB: 11/20/1955 Today's Date: 11/07/2015    History of Present Illness 60 yo admitted for Rt TKA. PMhx: cervical fusion (02/09/2014 by Dr.Cram pt still wearing cervical collar), GERD, depression    PT Comments    Pt progressing towards physical therapy goals. Was able to perform transfers and ambulation with modified independence to supervision for safety and RW for support. Pt motivated for distance this session and achieved 600 feet. From a mobility standpoint, pt is ready for d/c home. PT will continue to follow until d/c.  Follow Up Recommendations  Home health PT;Supervision for mobility/OOB     Equipment Recommendations  Rolling walker with 5" wheels;3in1 (PT)    Recommendations for Other Services       Precautions / Restrictions Precautions Precautions: Knee;Fall Restrictions Weight Bearing Restrictions: Yes RLE Weight Bearing: Weight bearing as tolerated    Mobility  Bed Mobility Overal bed mobility: Modified Independent             General bed mobility comments: Good technique to advance to edge of bed.    Transfers Overall transfer level: Modified independent Equipment used: Rolling walker (2 wheeled) Transfers: Sit to/from Stand           General transfer comment: No assist required and no unsteadiness noted. VC's for keeping walker close to him for safety.  Ambulation/Gait Ambulation/Gait assistance: Supervision Ambulation Distance (Feet): 600 Feet Assistive device: Rolling walker (2 wheeled) Gait Pattern/deviations: Step-through pattern;Decreased stride length;Decreased weight shift to right Gait velocity: Decreased Gait velocity interpretation: Below normal speed for age/gender General Gait Details: VC's for improved posture, increased heel strike, and increased floor clearance on the R. Pt able to make corrective changes and maintain throughout most of gait  training.    Stairs            Wheelchair Mobility    Modified Rankin (Stroke Patients Only)       Balance Overall balance assessment: Needs assistance Sitting-balance support: Feet supported;No upper extremity supported Sitting balance-Leahy Scale: Good     Standing balance support: No upper extremity supported;During functional activity Standing balance-Leahy Scale: Fair Standing balance comment: Able to stand without walker                     Cognition Arousal/Alertness: Awake/alert Behavior During Therapy: WFL for tasks assessed/performed Overall Cognitive Status: Within Functional Limits for tasks assessed                      Exercises Total Joint Exercises Quad Sets: 10 reps Heel Slides: 5 reps (stretching reps) Hip ABduction/ADduction: 10 reps Long Arc Quad: 10 reps Goniometric ROM: 102    General Comments        Pertinent Vitals/Pain Pain Assessment: Faces Faces Pain Scale: Hurts even more Pain Location:  knee Pain Descriptors / Indicators: Operative site guarding Pain Intervention(s): Limited activity within patient's tolerance;Monitored during session;Repositioned    Home Living                      Prior Function            PT Goals (current goals can now be found in the care plan section) Acute Rehab PT Goals Patient Stated Goal: play basketball and golf PT Goal Formulation: With patient Time For Goal Achievement: 11/12/15 Potential to Achieve Goals: Good Progress towards PT goals: Progressing toward goals    Frequency  7X/week  PT Plan Discharge plan needs to be updated    Co-evaluation             End of Session Equipment Utilized During Treatment: Gait belt Activity Tolerance: Patient tolerated treatment well Patient left: in chair;with call bell/phone within reach     Time: 1355-1423 PT Time Calculation (min) (ACUTE ONLY): 28 min  Charges:  $Gait Training: 23-37 mins $Therapeutic  Exercise: 8-22 mins                    G Codes:      Conni Slipper 11/24/15, 2:44 PM   Conni Slipper, PT, DPT Acute Rehabilitation Services Pager: [phone]

## 2015-11-07 NOTE — Progress Notes (Signed)
Cigarette fume was noted in the hallway by staff. On assessing, the scent was coming out of patients room. When asked if he was smoking in the room, he stated, someone smoked in his room and not him. Charge nurse was notified.

## 2015-11-07 NOTE — Progress Notes (Signed)
Patient ID: Gary RidingMichael D Milham, male   DOB: 08/13/1955, 60 y.o.   MRN: [id number] Patient is progressing well with therapy he has no complaints. His dressing is clean and dry. Patient states that he anticipates discharge to skilled nursing on Monday.

## 2015-11-07 NOTE — Progress Notes (Signed)
Physical Therapy Treatment Patient Details Name: Gary Davis MRN: [id number] DOB: 1955/10/21 Today's Date: 11/07/2015    History of Present Illness 60 yo admitted for Rt TKA. PMhx: cervical fusion (02/09/2014 by Dr.Cram pt still wearing cervical collar), GERD, depression    PT Comments    Pt progressing well towards physical therapy goals. Discussed update in discharge plan and recommendation for return home instead of SNF. Pt agreeable. Will continue to follow and progress as able per POC.   Follow Up Recommendations  Home health PT;Supervision for mobility/OOB     Equipment Recommendations  Rolling walker with 5" wheels;3in1 (PT)    Recommendations for Other Services       Precautions / Restrictions Precautions Precautions: Knee;Fall Restrictions Weight Bearing Restrictions: Yes RLE Weight Bearing: Weight bearing as tolerated    Mobility  Bed Mobility Overal bed mobility: Modified Independent             General bed mobility comments: Good technique to advance to edge of bed.    Transfers Overall transfer level: Modified independent Equipment used: Rolling walker (2 wheeled) Transfers: Sit to/from Stand           General transfer comment: No assist required and no unsteadiness noted. VC's for keeping walker close to him for safety.  Ambulation/Gait Ambulation/Gait assistance: Supervision Ambulation Distance (Feet): 510 Feet Assistive device: Rolling walker (2 wheeled) Gait Pattern/deviations: Step-through pattern;Decreased stride length;Trunk flexed Gait velocity: Decreased Gait velocity interpretation: Below normal speed for age/gender General Gait Details: VC's for improved posture, increased heel strike, and increased floor clearance on the R. Pt able to make corrective changes and maintain throughout most of gait training.    Stairs            Wheelchair Mobility    Modified Rankin (Stroke Patients Only)       Balance Overall  balance assessment: Needs assistance Sitting-balance support: Feet supported;No upper extremity supported Sitting balance-Leahy Scale: Good     Standing balance support: Bilateral upper extremity supported;During functional activity Standing balance-Leahy Scale: Fair Standing balance comment: Able to stand without walker                     Cognition Arousal/Alertness: Awake/alert Behavior During Therapy: WFL for tasks assessed/performed Overall Cognitive Status: Within Functional Limits for tasks assessed                      Exercises Total Joint Exercises Quad Sets: 10 reps Heel Slides: 5 reps (stretching reps) Hip ABduction/ADduction: 10 reps Long Arc Quad: 10 reps Goniometric ROM: 102    General Comments        Pertinent Vitals/Pain Pain Assessment: Faces Faces Pain Scale: Hurts little more Pain Location: R knee during exercise Pain Descriptors / Indicators: Operative site guarding;Sore Pain Intervention(s): Limited activity within patient's tolerance;Monitored during session;Repositioned    Home Living                      Prior Function            PT Goals (current goals can now be found in the care plan section) Acute Rehab PT Goals Patient Stated Goal: play basketball and golf PT Goal Formulation: With patient Time For Goal Achievement: 11/12/15 Potential to Achieve Goals: Good Progress towards PT goals: Progressing toward goals    Frequency  7X/week    PT Plan Discharge plan needs to be updated    Co-evaluation  End of Session Equipment Utilized During Treatment: Gait belt Activity Tolerance: Patient tolerated treatment well Patient left: in chair;with call bell/phone within reach     Time: 0907-0948 PT Time Calculation (min) (ACUTE ONLY): 41 min  Charges:  $Gait Training: 23-37 mins $Therapeutic Exercise: 8-22 mins                    G Codes:      Conni SlipperKirkman, Akin Yi 11/07/2015, 12:41 PM  Conni SlipperLaura  Floraine Buechler, PT, DPT Acute Rehabilitation Services Pager: [phone]

## 2015-11-08 NOTE — Progress Notes (Signed)
OT Cancellation Note  Patient Details Name: Gary Davis MRN: [id number] DOB: 12/11/1955   Cancelled Treatment:    Reason Eval/Treat Not Completed: Other (comment). Pt dressed and awaiting d/c anytime now.  Declines need for any OT review, or any questions or concerns for home.    Gary Davis, Gary Davis, COTA/L 11/08/2015, 11:04 AM

## 2015-11-08 NOTE — Progress Notes (Signed)
Physical Therapy Treatment Patient Details Name: HUBER MATHERS MRN: [id number] DOB: 1956-01-12 Today's Date: 11/08/2015    History of Present Illness 60 yo admitted for Rt TKA. PMhx: cervical fusion (02/09/2014 by Dr.Cram pt still wearing cervical collar), GERD, depression    PT Comments    Pt progressing well towards physical therapy goals. Was able to improve overall gait pattern and is currently not requiring any assistance for mobility. Pt was instructed on stair negotiation and was able to complete at a supervision level. Will continue to follow and progress as able per POC.   Follow Up Recommendations  Home health PT;Supervision for mobility/OOB     Equipment Recommendations  Rolling walker with 5" wheels;3in1 (PT)    Recommendations for Other Services       Precautions / Restrictions Precautions Precautions: Knee;Fall Precaution Comments: Reinforced no pillow/roll under knee, only under heel.  Restrictions Weight Bearing Restrictions: Yes RLE Weight Bearing: Weight bearing as tolerated    Mobility  Bed Mobility               General bed mobility comments: Pt sitting up on EOB when PT arrived.   Transfers Overall transfer level: Modified independent Equipment used: Rolling walker (2 wheeled) Transfers: Sit to/from Stand           General transfer comment: No assist required and no unsteadiness noted. VC's for keeping walker close to him for safety.  Ambulation/Gait Ambulation/Gait assistance: Modified independent (Device/Increase time) Ambulation Distance (Feet): 510 Feet Assistive device: Rolling walker (2 wheeled) Gait Pattern/deviations: Decreased weight shift to right;Decreased dorsiflexion - right Gait velocity: Decreased Gait velocity interpretation: Below normal speed for age/gender General Gait Details: VC's for improved posture, increased heel strike, and increased floor clearance on the R. Pt able to make corrective changes and maintain  throughout most of gait training.    Stairs Stairs: Yes Stairs assistance: Supervision Stair Management: Two rails;Step to pattern;Forwards Number of Stairs: 5 General stair comments: VC's for sequencing and general safety awareness. Pt completed well with no assist or noted unsteadiness.   Wheelchair Mobility    Modified Rankin (Stroke Patients Only)       Balance Overall balance assessment: Needs assistance Sitting-balance support: Feet supported;No upper extremity supported Sitting balance-Leahy Scale: Good     Standing balance support: No upper extremity supported;During functional activity Standing balance-Leahy Scale: Fair                      Cognition Arousal/Alertness: Awake/alert Behavior During Therapy: WFL for tasks assessed/performed Overall Cognitive Status: Within Functional Limits for tasks assessed                      Exercises Total Joint Exercises Short Arc Quad: 20 reps Heel Slides: 5 reps Hip ABduction/ADduction: 20 reps Goniometric ROM: 103 AAROM    General Comments        Pertinent Vitals/Pain Pain Assessment: Faces Faces Pain Scale: Hurts little more Pain Location: knee Pain Descriptors / Indicators: Operative site guarding;Sore Pain Intervention(s): Limited activity within patient's tolerance;Monitored during session;Repositioned    Home Living                      Prior Function            PT Goals (current goals can now be found in the care plan section) Acute Rehab PT Goals Patient Stated Goal: play basketball and golf PT Goal Formulation: With patient Time For Goal  Achievement: 11/12/15 Potential to Achieve Goals: Good Progress towards PT goals: Progressing toward goals    Frequency  7X/week    PT Plan Current plan remains appropriate    Co-evaluation             End of Session Equipment Utilized During Treatment: Gait belt Activity Tolerance: Patient tolerated treatment  well Patient left: in chair;with call bell/phone within reach     Time: 0746-0824 PT Time Calculation (min) (ACUTE ONLY): 38 min  Charges:  $Gait Training: 23-37 mins $Therapeutic Exercise: 8-22 mins                    G Codes:      Conni SlipperKirkman, Argyle Gustafson 11/08/2015, 10:01 AM   Conni SlipperLaura Jayvian Escoe, PT, DPT Acute Rehabilitation Services Pager: [phone]

## 2015-11-08 NOTE — Progress Notes (Signed)
   Subjective:  Patient taking a shower  Objective:   VITALS:   Filed Vitals:   11/07/15 0448 11/07/15 1222 11/07/15 2005 11/08/15 0516  BP: 125/70 135/81 111/62 115/60  Pulse: 58 55 65 66  Temp: 98.1 F (36.7 C) 98.8 F (37.1 C) 98.5 F (36.9 C) 99 F (37.2 C)  TempSrc: Oral Oral Oral Oral  Resp: 18 20 18 18   Height:      Weight:      SpO2: 98% 100% 99% 98%    Neurologically intact Neurovascular intact Sensation intact distally Intact pulses distally Dorsiflexion/Plantar flexion intact Incision: dressing C/D/I and no drainage No cellulitis present Compartment soft   Lab Results  Component Value Date   WBC 8.6 11/05/2015   HGB 11.1* 11/05/2015   HCT 33.5* 11/05/2015   MCV 96.5 11/05/2015   PLT 207 11/05/2015     Assessment/Plan:  4 Days Post-Op   - stable - dc home today  Cheral AlmasXu, Naiping Kaydan 11/08/2015, 6:44 AM [phone]

## 2015-11-08 NOTE — Discharge Summary (Signed)
Physician Discharge Summary      Patient ID: Gary Davis MRN: [id number] DOB/AGE: 62/12/1955 62 y.o.  Admit date: 11/04/2015 Discharge date: 11/08/2015  Admission Diagnoses:  <principal problem not specified>  Discharge Diagnoses:  Active Problems:   Osteoarthritis of right knee   Total knee replacement status   Past Medical History  Diagnosis Date  . Cataract   . Alcohol abuse   . Depression   . GERD (gastroesophageal reflux disease)   . Arthritis     "both knees; joints in hands; back of my neck; lower back" (11/04/2015)  . Chronic back pain     "upper and lower" (11/04/2015)    Surgeries: Procedure(s): RIGHT TOTAL KNEE ARTHROPLASTY on 11/04/2015   Consultants (if any):    Discharged Condition: Improved  Hospital Course: Gary Davis is an 60 y.o. male who was admitted 11/04/2015 with a diagnosis of <principal problem not specified> and went to the operating room on 11/04/2015 and underwent the above named procedures.    He was given perioperative antibiotics:  Anti-infectives    Start     Dose/Rate Route Frequency Ordered Stop   11/04/15 1730  ceFAZolin (ANCEF) IVPB 2g/100 mL premix     2 g 200 mL/hr over 30 Minutes Intravenous Every 6 hours 11/04/15 1638 11/04/15 2157   11/04/15 1005  ceFAZolin (ANCEF) IVPB 2g/100 mL premix     2 g 200 mL/hr over 30 Minutes Intravenous On call to O.R. 11/04/15 1005 11/04/15 1243    .  He was given sequential compression devices, early ambulation, and aspirin for DVT prophylaxis.  He benefited maximally from the hospital stay and there were no complications.    Recent vital signs:  Filed Vitals:   11/07/15 2005 11/08/15 0516  BP: 111/62 115/60  Pulse: 65 66  Temp: 98.5 F (36.9 C) 99 F (37.2 C)  Resp: 18 18    Recent laboratory studies:  Lab Results  Component Value Date   HGB 11.1* 11/05/2015   HGB 13.3 11/02/2015   HGB 15.3 04/14/2015   Lab Results  Component Value Date   WBC 8.6 11/05/2015   PLT 207  11/05/2015   Lab Results  Component Value Date   INR 1.10 11/02/2015   Lab Results  Component Value Date   NA 136 11/05/2015   K 4.6 11/05/2015   CL 103 11/05/2015   CO2 24 11/05/2015   BUN 8 11/05/2015   CREATININE 1.04 11/05/2015   GLUCOSE 160* 11/05/2015    Discharge Medications:     Medication List    TAKE these medications        aspirin EC 325 MG tablet  Take 1 tablet (325 mg total) by mouth 2 (two) times daily.     baclofen 10 MG tablet  Commonly known as:  LIORESAL  Take 10 mg by mouth 3 (three) times daily.     gabapentin 300 MG capsule  Commonly known as:  NEURONTIN  TAKE 1 CAPSULE (300 MG TOTAL) BY MOUTH 3 (THREE) TIMES DAILY.     meloxicam 15 MG tablet  Commonly known as:  MOBIC  Take 1 tablet (15 mg total) by mouth daily.     methocarbamol 750 MG tablet  Commonly known as:  ROBAXIN  Take 1 tablet (750 mg total) by mouth 2 (two) times daily as needed for muscle spasms.     ondansetron 4 MG tablet  Commonly known as:  ZOFRAN  Take 1-2 tablets (4-8 mg total) by mouth every 8 (  eight) hours as needed for nausea or vomiting.     Oxycodone HCl 20 MG Tabs  Take 1 tablet by mouth every 6 (six) hours as needed (pain).     oxyCODONE 5 MG immediate release tablet  Commonly known as:  Oxy IR/ROXICODONE  Take 1-3 tablets (5-15 mg total) by mouth every 4 (four) hours as needed.     oxyCODONE 10 mg 12 hr tablet  Commonly known as:  OXYCONTIN  Take 1 tablet (10 mg total) by mouth every 12 (twelve) hours.     pantoprazole 40 MG tablet  Commonly known as:  PROTONIX  Take 1 tablet (40 mg total) by mouth daily.     senna-docusate 8.6-50 MG tablet  Commonly known as:  SENOKOT S  Take 1 tablet by mouth at bedtime as needed.     Vitamin D (Ergocalciferol) 50000 units Caps capsule  Commonly known as:  DRISDOL  Take 1 capsule (50,000 Units total) by mouth every 7 (seven) days.        Diagnostic Studies: Dg Knee Right Port  11/04/2015  CLINICAL DATA:  Status  post right knee arthroplasty. EXAM: PORTABLE RIGHT KNEE - 1-2 VIEW COMPARISON:  None. FINDINGS: The prosthetic components are well-seated and aligned. No acute fracture or evidence of an operative complication. IMPRESSION: Well-aligned right knee prosthesis. Electronically Signed   By: Amie Portland M.D.   On: 11/04/2015 15:29    Disposition: 01-Home or Self Care      Discharge Instructions    Call MD / Call 911    Complete by:  As directed   If you experience chest pain or shortness of breath, CALL 911 and be transported to the hospital emergency room.  If you develope a fever above 101.5 F, pus (white drainage) or increased drainage or redness at the wound, or calf pain, call your surgeon's office.     Constipation Prevention    Complete by:  As directed   Drink plenty of fluids.  Prune juice may be helpful.  You may use a stool softener, such as Colace (over the counter) 100 mg twice a day.  Use MiraLax (over the counter) for constipation as needed.     Diet - low sodium heart healthy    Complete by:  As directed      Diet general    Complete by:  As directed      Driving restrictions    Complete by:  As directed   No driving while taking narcotic pain meds.     Increase activity slowly as tolerated    Complete by:  As directed            Follow-up Information    Follow up with Cheral Almas, MD In 2 weeks.   Specialty:  Orthopedic Surgery   Why:  For suture removal, For wound re-check   Contact information:   [address] [place] [address] [phone]        Signed: Roxas, Clymer 11/08/2015, 6:44 AM

## 2015-11-08 NOTE — Progress Notes (Signed)
Pt discharge from unit alert and oriented with brother transporting to home via [employer]. All personal belongings with pt , RX and discharge instructions reviewed. Knee immobilizer in use. No distress noted.

## 2015-11-08 NOTE — Care Management Note (Signed)
Case Management Note  Patient Details  Name: Gary Davis MRN: [id number] Date of Birth: 04/30/1956  Subjective/Objective:                 S/p right total knee arthroplasty   Action/Plan: Spoke with patient about discharge plan. Patient is walking 62000ft and therapy recommending HHPT. Gave patient choice, he selected [facility]. Contacted Tiffany at [facility] and set up HHPT. Contacted James at [facility] and requested rolling walker and 3N1 be delivered to patient's room. Patient stated that his brother will be checking on him after discharge.   Expected Discharge Date:                  Expected Discharge Plan:  Home w Home Health Services  In-House Referral:  Clinical Social Work  Discharge planning Services  CM Consult  Post Acute Care Choice:  Durable Medical Equipment, Home Health Choice offered to:     DME Arranged:  3-N-1, Walker rolling DME Agency:  [facility]  HH Arranged:  PT HH Agency:  [facility]  Status of Service:  Completed, signed off  Medicare Important Message Given:    Date Medicare IM Given:    Medicare IM give by:    Date Additional Medicare IM Given:    Additional Medicare Important Message give by:     If discussed at Long Length of Stay Meetings, dates discussed:    Additional Comments:  [name], [name], RN 11/08/2015, 8:46 AM

## 2016-02-19 ENCOUNTER — Encounter (INDEPENDENT_AMBULATORY_CARE_PROVIDER_SITE_OTHER): Payer: Self-pay

## 2016-02-24 IMAGING — CT CT CERVICAL SPINE W/O CM
2 of 4 series · 5 of 14 positions shown, 6 images · non-contrast
Comparison: CT C-spine 08/24/2014, head CT 02/04/2014

CLINICAL DATA: Fall, headache

EXAM:
CT HEAD WITHOUT CONTRAST
CT CERVICAL SPINE WITHOUT CONTRAST
TECHNIQUE: Multidetector CT imaging of the head and cervical spine was
performed following the standard protocol without intravenous
contrast. Multiplanar CT image reconstructions of the cervical spine
were also generated.

[Series 3: axial recon · axial · 0.23mm/px · z∈[-268,-168]mm · 3 of 110 slices shown, 4 images]
[im 28/110  soft-tissue]
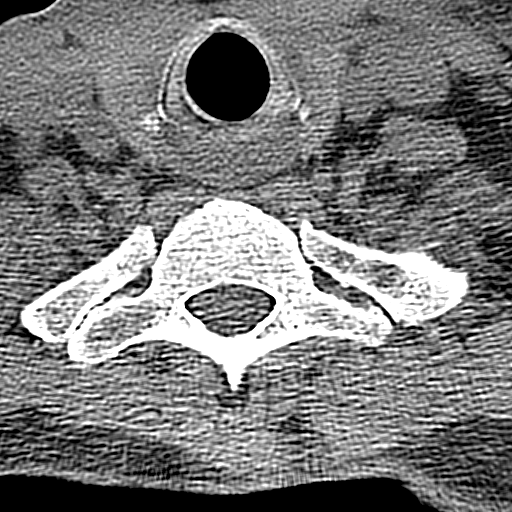
[im 28/110  bone]
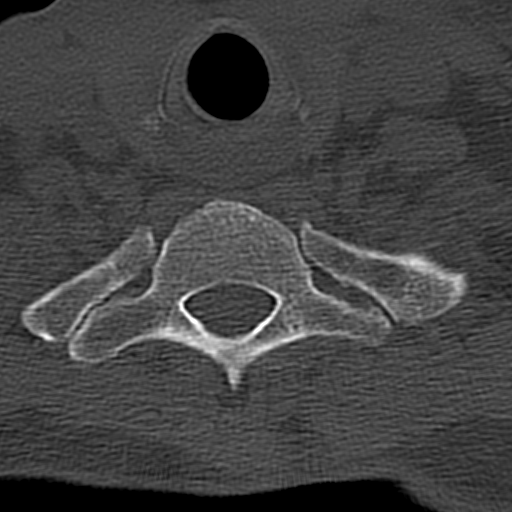
[im 55/110  bone]
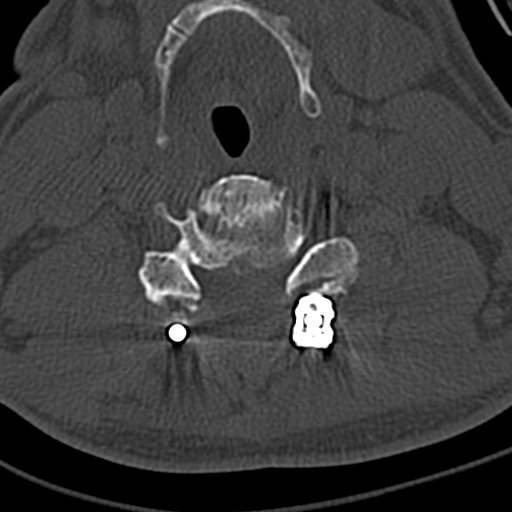
[im 82/110  bone]
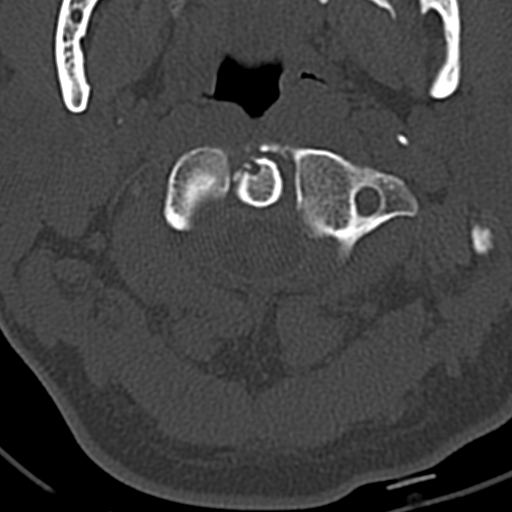

[Series 4: c-spine st · axial · 0.23mm/px · z∈[-227,-171]mm · 2 of 85 slices shown]
[im 29/85  bone]
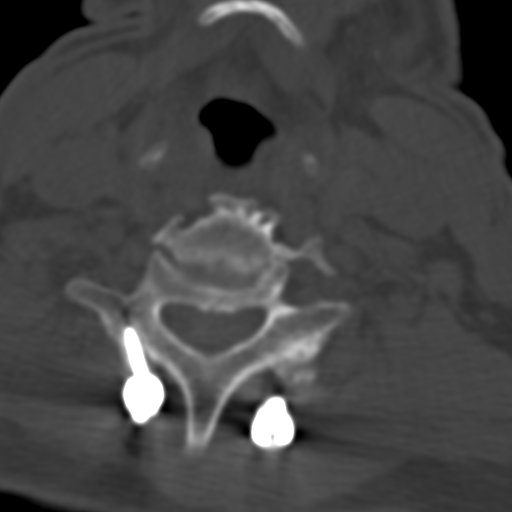
[im 57/85  bone]
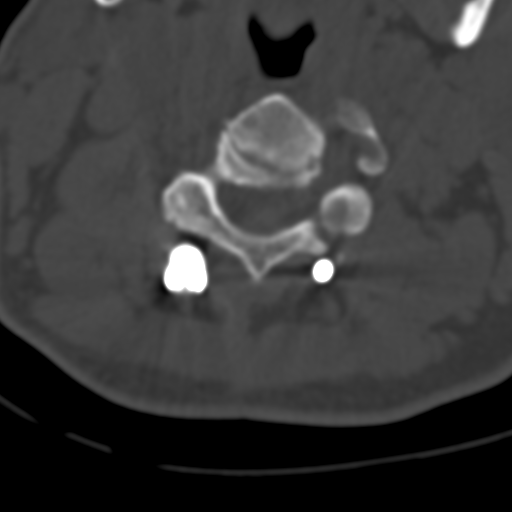

[5 of 14 positions shown; findings below may reference images not displayed]

FINDINGS: CT HEAD FINDINGS

Left frontal scalp swelling noted. No acute hemorrhage, infarct, or
mass lesion is identified. No midline shift. Ventricles are normal
in size. Orbits and paranasal sinuses are unremarkable. No skull
fracture.

CT CERVICAL SPINE FINDINGS

Evidence of posterior decompression spanning C3-C7 reidentified.
Mild multilevel disc degenerative change at these levels is
reidentified. No significant change in mild loss of intervertebral
disc space at these levels or central loss of vertebral body height.
No new compression deformity. No fracture or dislocation.
IMPRESSION: Left frontal scalp hematoma.

No acute intracranial abnormality.

No acute cervical spine fracture or evidence for hardware failure
post C3 -C7 fusion.

## 2016-04-01 IMAGING — CR DG CHEST 2V
2 series · 2 of 2 positions shown · non-contrast
Comparison: 11/01/2014

CLINICAL DATA: Continued right-sided pleuritic chest pain after
fall 1 week ago.

EXAM:
CHEST  2 VIEW

[chest pa]
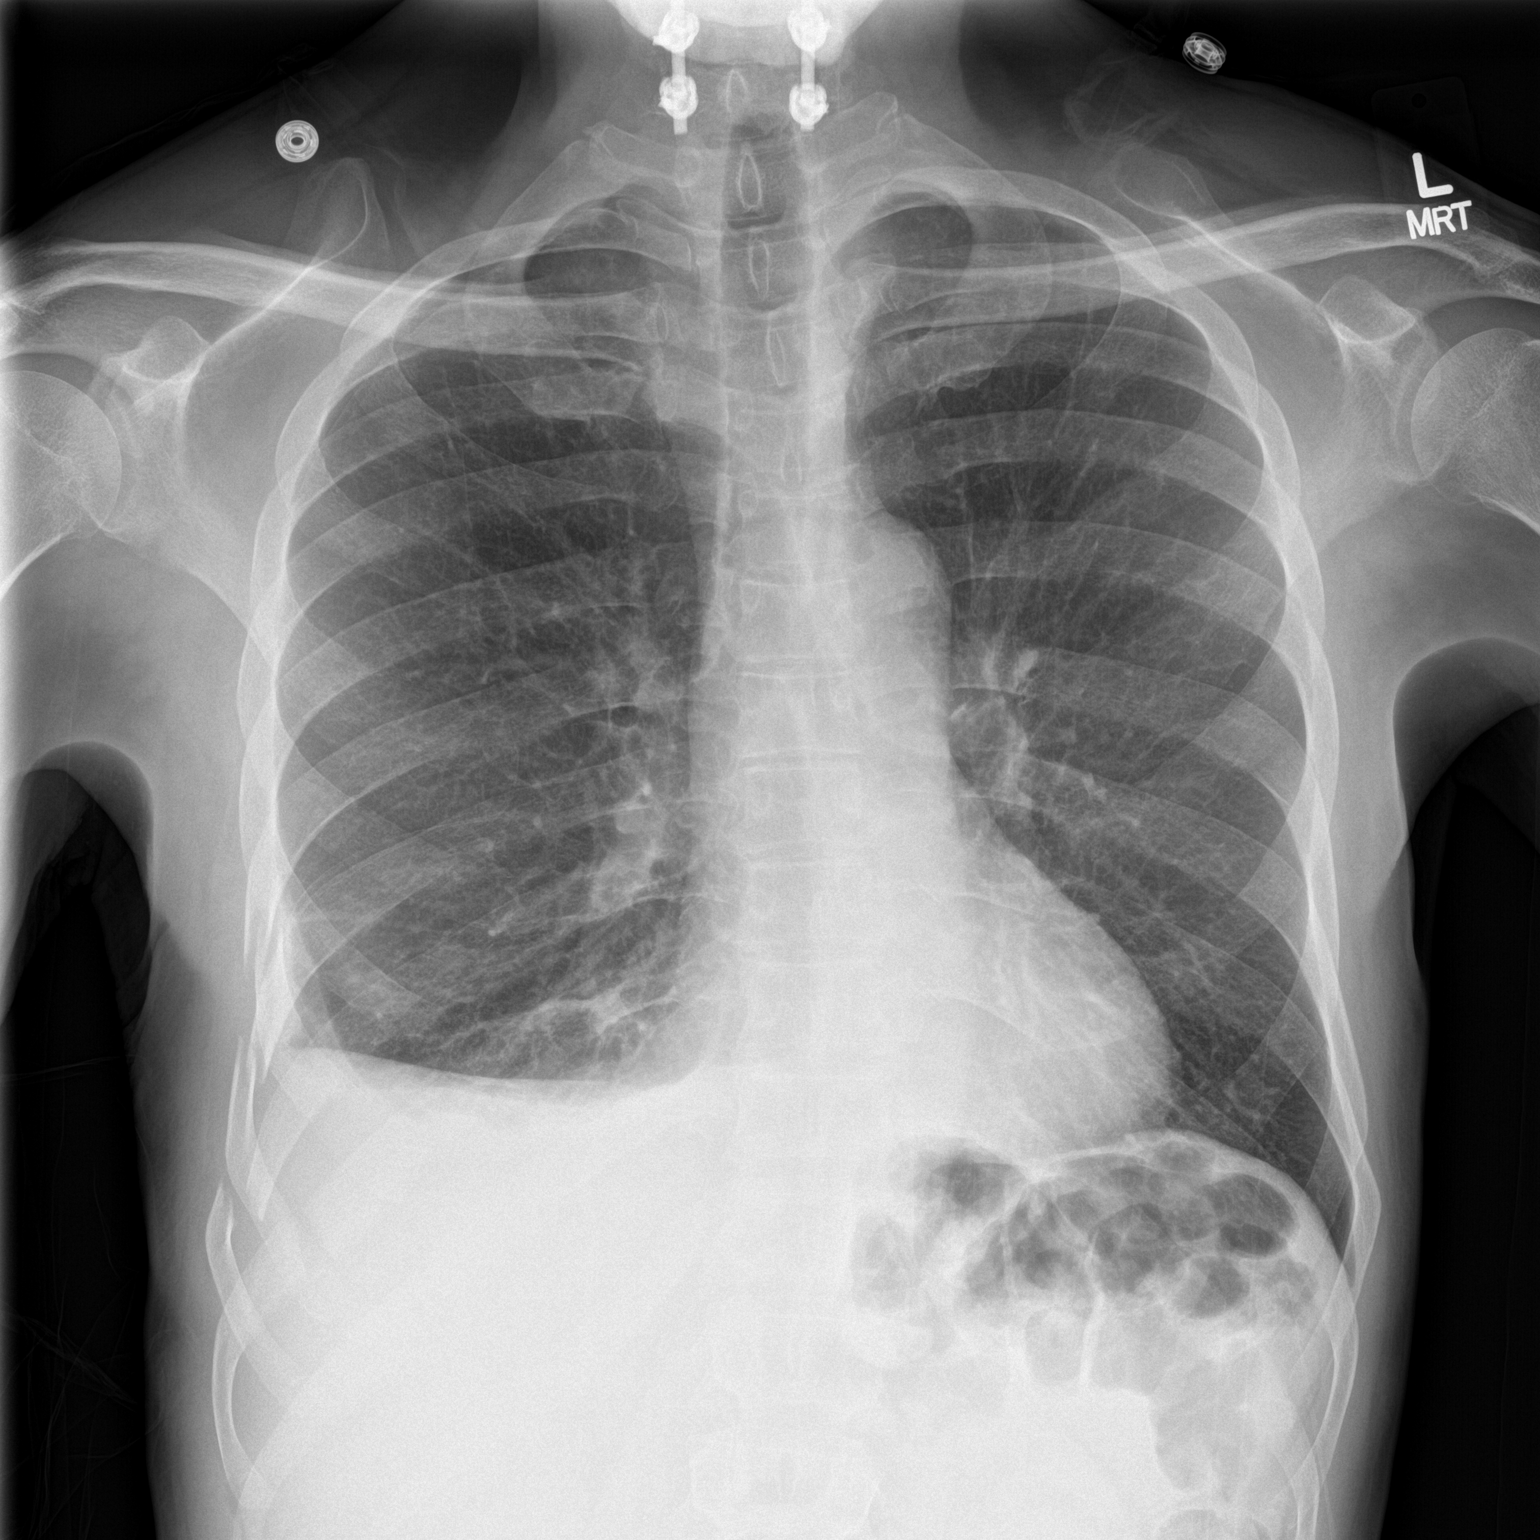

[chest lat]
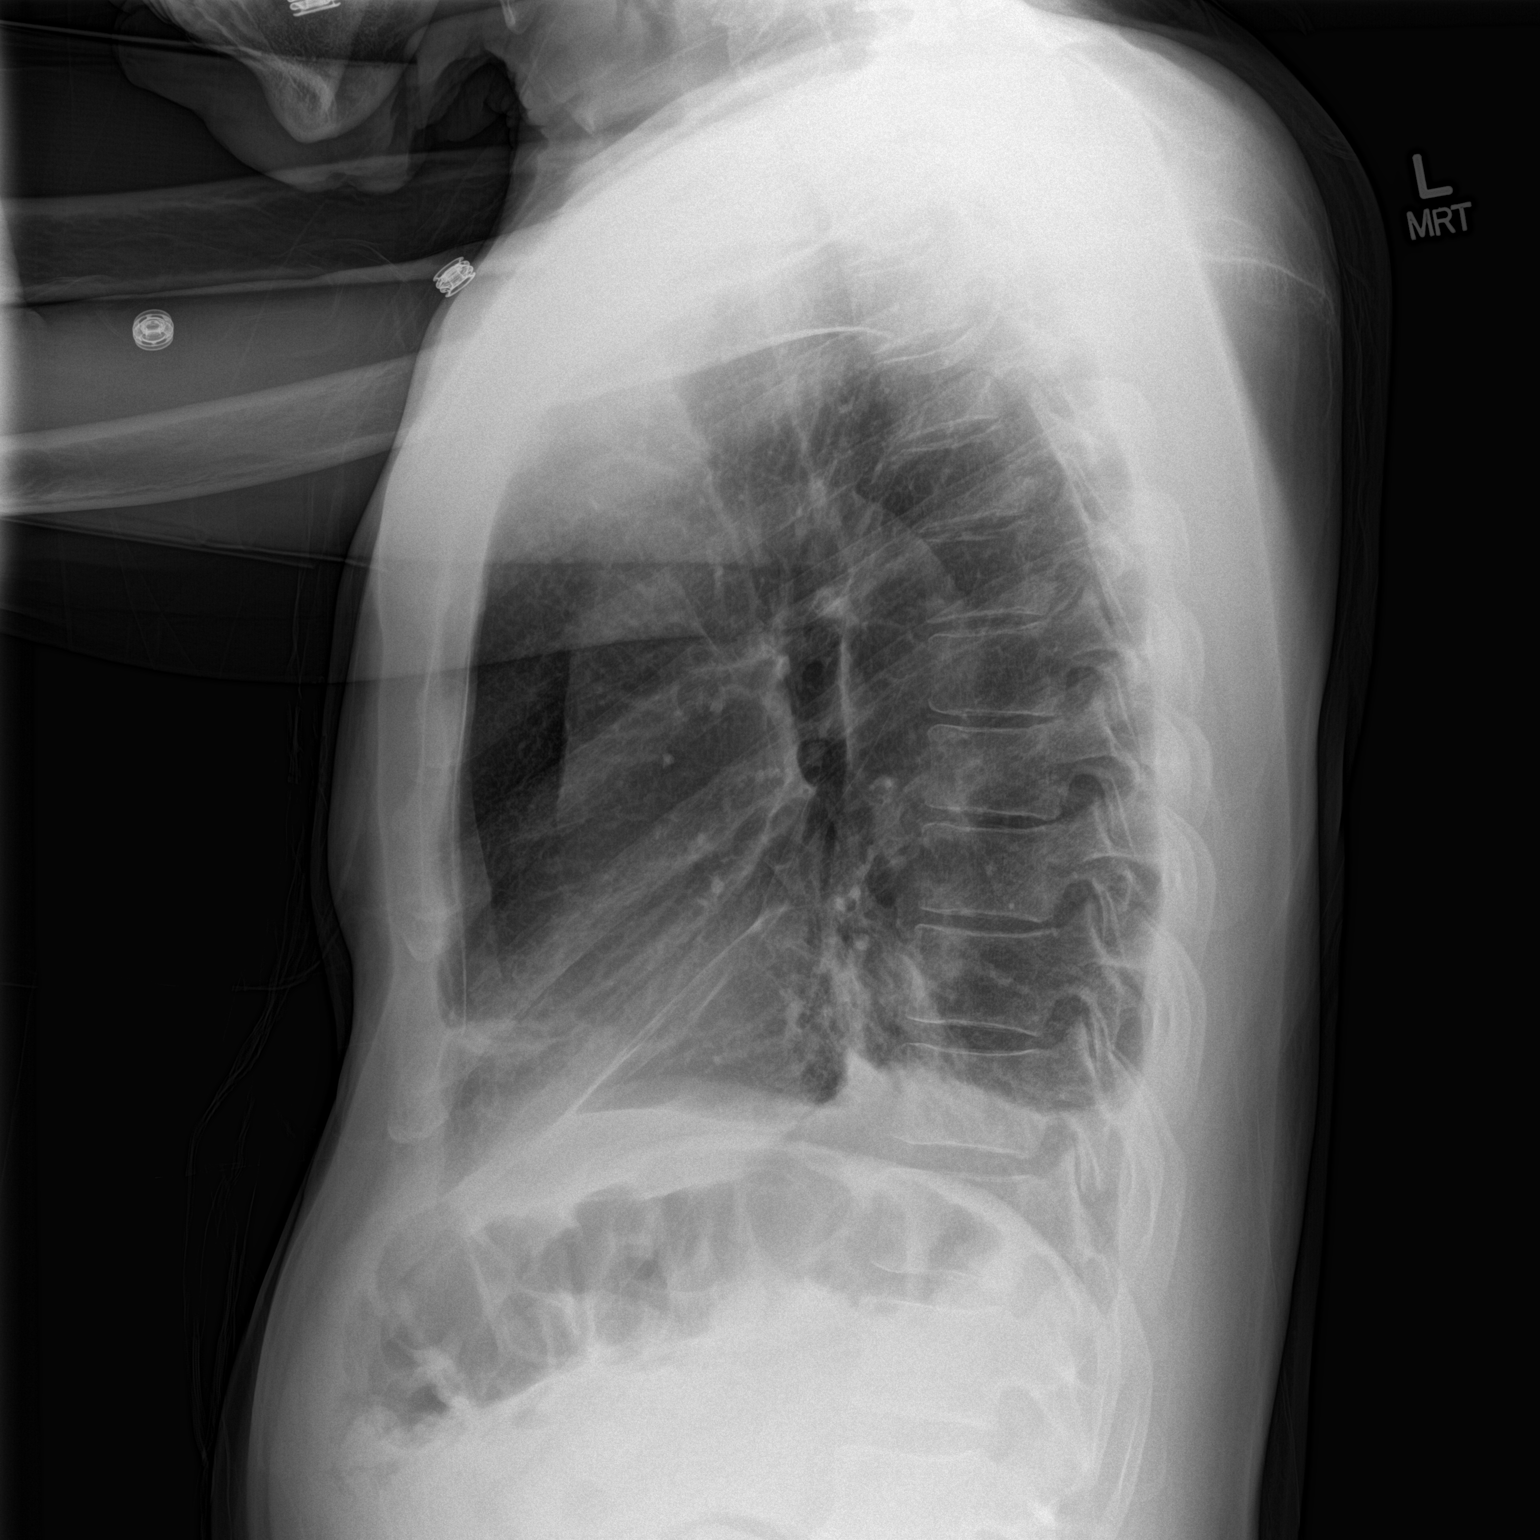

[2 of 2 positions shown; findings below may reference images not displayed]

FINDINGS: Fractures of the right seventh through tenth ribs are present. There
is a tiny right apical pneumothorax. There is a small right effusion
with pleural air-fluid level. Minimal atelectatic appearing linear
opacities are present in the right base. The left lung is clear.
Hilar, mediastinal and cardiac contours are normal and unchanged.
Pulmonary vasculature is normal.
IMPRESSION: Right seventh through tenth rib fractures. Small right hydro
pneumothorax. The pneumothorax component is very small, located in
the apex.

## 2016-05-08 ENCOUNTER — Ambulatory Visit (INDEPENDENT_AMBULATORY_CARE_PROVIDER_SITE_OTHER): Payer: Self-pay | Admitting: Orthopaedic Surgery

## 2016-05-19 ENCOUNTER — Ambulatory Visit (INDEPENDENT_AMBULATORY_CARE_PROVIDER_SITE_OTHER): Payer: Medicare Other

## 2016-05-19 ENCOUNTER — Encounter (INDEPENDENT_AMBULATORY_CARE_PROVIDER_SITE_OTHER): Payer: Self-pay | Admitting: Orthopaedic Surgery

## 2016-05-19 ENCOUNTER — Ambulatory Visit (INDEPENDENT_AMBULATORY_CARE_PROVIDER_SITE_OTHER): Payer: Medicare Other | Admitting: Orthopaedic Surgery

## 2016-05-19 DIAGNOSIS — Z96651 Presence of right artificial knee joint: Secondary | ICD-10-CM | POA: Diagnosis not present

## 2016-05-19 DIAGNOSIS — M1711 Unilateral primary osteoarthritis, right knee: Secondary | ICD-10-CM | POA: Diagnosis not present

## 2016-05-19 NOTE — Progress Notes (Signed)
Office Visit Note   Patient: Gary Davis           Date of Birth: 12/30/1955           MRN: [id number] Visit Date: 05/19/2016              Requested by: Ambrose FinlandValerie A Keck, NP No address on file PCP: Ambrose FinlandValerie A Keck, NP   Assessment & Plan: Visit Diagnoses:  1. Primary osteoarthritis of right knee   2. Status post total right knee replacement     Plan: X-rays are stable on today's visit. I think that he is having a lot of issues with his chronic pain and also he has motor and sensory problems from his spine that neurosurgery is managing. I do not see any overt signs of problems with his knee replacement. I'll see him back in 6 months with repeat 2 view x-rays of right knee  Follow-Up Instructions: Return in about 6 months (around 11/16/2016) for recheck right TKA.   Orders:  Orders Placed This Encounter  Procedures  . XR Knee 1-2 Views Right   No orders of the defined types were placed in this encounter.     Procedures: No procedures performed   Clinical Data: No additional findings.   Subjective: Chief Complaint  Patient presents with  . Right Knee - Pain    Patient is 6 months status post right total knee replacement. He says that he is in chronic pain. He endorses swelling when he walks for a long time. He also has a negative and C-spine issues and he also has chronic pain syndrome. His right knee will give out periodically.    Review of Systems   Objective: Vital Signs: There were no vitals taken for this visit.  Physical Exam  Ortho Exam Exam of the right knee shows excellent range of motion. Well-healed surgical scar. There is no signs of infection or swelling. Collaterals are stable. Specialty Comments:  No specialty comments available.  Imaging: Xr Knee 1-2 Views Right  Result Date: 05/19/2016 Stable total knee replacement no complications.    PMFS History: Patient Active Problem List   Diagnosis Date Noted  . Osteoarthritis of right  knee 11/04/2015  . Total knee replacement status 11/04/2015  . Cervical spine fracture, initial encounter 04/15/2015  . Fall 11/02/2014  . Rib fractures 10/31/2014  . Esophageal reflux 03/02/2014  . Anemia, unspecified 03/02/2014  . Insomnia 03/02/2014  . IFG (impaired fasting glucose) 03/02/2014  . Central cord syndrome (HCC) 02/11/2014  . Myelopathy, spondylogenic, cervical 02/09/2014  . Bilateral arm weakness 02/04/2014  . Cord compression syndrome (HCC) 02/04/2014  . Tobacco use disorder 02/04/2014  . Arthritis 02/04/2014   Past Medical History:  Diagnosis Date  . Alcohol abuse   . Arthritis    "both knees; joints in hands; back of my neck; lower back" (11/04/2015)  . Cataract   . Chronic back pain    "upper and lower" (11/04/2015)  . Depression   . Fractures 04/2015   neck fracture  . GERD (gastroesophageal reflux disease)     Family History  Problem Relation Age of Onset  . Cancer Mother     breast cancer    Past Surgical History:  Procedure Laterality Date  . ARTHROSCOPIC REPAIR ACL Right   . BACK SURGERY    . FINGER FRACTURE SURGERY Right    4th Finger  . FRACTURE SURGERY    . JOINT REPLACEMENT    . MANDIBLE FRACTURE SURGERY  1990s  . ORBITAL FRACTURE SURGERY Right ~ 1970  . ORIF ANKLE FRACTURE Right   . POSTERIOR CERVICAL FUSION/FORAMINOTOMY N/A 02/09/2014   Procedure: POSTERIOR CERVICAL FUSION/FORAMINOTOMY LEVEL 4  Cervical three to seven decompression and fusion with lateral mass screws;  Surgeon: [name], MD;  Location: [facility];  Service: Neurosurgery;  Laterality: N/A;  . POSTERIOR CERVICAL FUSION/FORAMINOTOMY N/A 04/15/2015   Procedure: Posterior cervical fusion C6-T1 with instrumentation and arthrodesis, decompressive laminectomy C7-T1, Foraminotomy C8-T1;  Surgeon: Donalee CitrinGary Cram, MD;  Location: [facility];  Service: Neurosurgery;  Laterality: N/A;  . TONSILLECTOMY    . TOTAL KNEE ARTHROPLASTY Right 11/04/2015  . TOTAL KNEE ARTHROPLASTY Right 11/04/2015    Procedure: RIGHT TOTAL KNEE ARTHROPLASTY;  Surgeon: Tarry KosNaiping M Xu, MD;  Location: [facility];  Service: Orthopedics;  Laterality: Right;   Social History   Occupational History  . Not on file.   Social History Main Topics  . Smoking status: Former Smoker    Packs/day: 0.12    Years: 30.00    Types: Cigarettes    Quit date: 01/17/2016  . Smokeless tobacco: Never Used     Comment: states he quit 3-4 months ago  . Alcohol use 3.6 oz/week    6 Cans of beer per week  . Drug use: No  . Sexual activity: Not Currently

## 2016-11-16 ENCOUNTER — Ambulatory Visit (INDEPENDENT_AMBULATORY_CARE_PROVIDER_SITE_OTHER): Payer: Medicaid Other | Admitting: Orthopaedic Surgery

## 2016-11-21 ENCOUNTER — Ambulatory Visit (INDEPENDENT_AMBULATORY_CARE_PROVIDER_SITE_OTHER): Payer: Medicare HMO

## 2016-11-21 ENCOUNTER — Ambulatory Visit (INDEPENDENT_AMBULATORY_CARE_PROVIDER_SITE_OTHER): Payer: Medicare HMO | Admitting: Orthopaedic Surgery

## 2016-11-21 ENCOUNTER — Encounter (INDEPENDENT_AMBULATORY_CARE_PROVIDER_SITE_OTHER): Payer: Self-pay | Admitting: Orthopaedic Surgery

## 2016-11-21 ENCOUNTER — Encounter (INDEPENDENT_AMBULATORY_CARE_PROVIDER_SITE_OTHER): Payer: Self-pay

## 2016-11-21 DIAGNOSIS — M1711 Unilateral primary osteoarthritis, right knee: Secondary | ICD-10-CM

## 2016-11-21 NOTE — Progress Notes (Signed)
Office Visit Note   Patient: Gary Davis           Date of Birth: 04/07/1956           MRN: [id number] Visit Date: 11/21/2016              Requested by: Ambrose FinlandKeck, Valerie A, NP [address] [place], [place] [id number] PCP: [facility], [facility]   Assessment & Plan: Visit Diagnoses:  1. Primary osteoarthritis of right knee     Plan: Patient is doing well for his 1 year visit. I'll see him back in another year with repeat 2 view x-rays of the right knee. I did remind him of antibiotic prophylaxis.  Follow-Up Instructions: Return in about 1 year (around 11/21/2017).   Orders:  Orders Placed This Encounter  Procedures  . XR Knee 1-2 Views Right   No orders of the defined types were placed in this encounter.     Procedures: No procedures performed   Clinical Data: No additional findings.   Subjective: Chief Complaint  Patient presents with  . Right Knee - Pain, Follow-up    Patient is one year status post right total knee replacement. Well with occasional pain and swelling. He endorses some nerve sensations every now and then.    Review of Systems   Objective: Vital Signs: There were no vitals taken for this visit.  Physical Exam  Ortho Exam Right knee exam shows a fully healed surgical scar. His range of motion is excellent. No signs of infection or swelling. Specialty Comments:  No specialty comments available.  Imaging: No results found.   PMFS History: Patient Active Problem List   Diagnosis Date Noted  . Osteoarthritis of right knee 11/04/2015  . Total knee replacement status 11/04/2015  . Cervical spine fracture, initial encounter 04/15/2015  . Fall 11/02/2014  . Rib fractures 10/31/2014  . Esophageal reflux 03/02/2014  . Anemia, unspecified 03/02/2014  . Insomnia 03/02/2014  . IFG (impaired fasting glucose) 03/02/2014  . Central cord syndrome (HCC) 02/11/2014  . Myelopathy, spondylogenic, cervical 02/09/2014  .  Bilateral arm weakness 02/04/2014  . Cord compression syndrome (HCC) 02/04/2014  . Tobacco use disorder 02/04/2014  . Arthritis 02/04/2014   Past Medical History:  Diagnosis Date  . Alcohol abuse   . Arthritis    "both knees; joints in hands; back of my neck; lower back" (11/04/2015)  . Cataract   . Chronic back pain    "upper and lower" (11/04/2015)  . Depression   . Fractures 04/2015   neck fracture  . GERD (gastroesophageal reflux disease)     Family History  Problem Relation Age of Onset  . Cancer Mother        breast cancer    Past Surgical History:  Procedure Laterality Date  . ARTHROSCOPIC REPAIR ACL Right   . BACK SURGERY    . FINGER FRACTURE SURGERY Right    4th Finger  . FRACTURE SURGERY    . JOINT REPLACEMENT    . MANDIBLE FRACTURE SURGERY  1990s  . ORBITAL FRACTURE SURGERY Right ~ 1970  . ORIF ANKLE FRACTURE Right   . POSTERIOR CERVICAL FUSION/FORAMINOTOMY N/A 02/09/2014   Procedure: POSTERIOR CERVICAL FUSION/FORAMINOTOMY LEVEL 4  Cervical three to seven decompression and fusion with lateral mass screws;  Surgeon: [name], MD;  Location: [facility];  Service: Neurosurgery;  Laterality: N/A;  . POSTERIOR CERVICAL FUSION/FORAMINOTOMY N/A 04/15/2015   Procedure: Posterior cervical fusion C6-T1 with  instrumentation and arthrodesis, decompressive laminectomy C7-T1, Foraminotomy C8-T1;  Surgeon: Donalee Citrin, MD;  Location: [facility];  Service: Neurosurgery;  Laterality: N/A;  . TONSILLECTOMY    . TOTAL KNEE ARTHROPLASTY Right 11/04/2015  . TOTAL KNEE ARTHROPLASTY Right 11/04/2015   Procedure: RIGHT TOTAL KNEE ARTHROPLASTY;  Surgeon: Tarry Kos, MD;  Location: [facility];  Service: Orthopedics;  Laterality: Right;   Social History   Occupational History  . Not on file.   Social History Main Topics  . Smoking status: Former Smoker    Packs/day: 0.12    Years: 30.00    Types: Cigarettes    Quit date: 01/17/2016  . Smokeless tobacco: Never Used     Comment: states  he quit 3-4 months ago  . Alcohol use 3.6 oz/week    6 Cans of beer per week  . Drug use: No  . Sexual activity: Not Currently

## 2016-11-22 ENCOUNTER — Ambulatory Visit: Payer: Medicare HMO | Attending: Primary Care | Admitting: Rehabilitative and Restorative Service Providers"

## 2016-11-22 DIAGNOSIS — M25511 Pain in right shoulder: Secondary | ICD-10-CM | POA: Diagnosis present

## 2016-11-22 DIAGNOSIS — M6281 Muscle weakness (generalized): Secondary | ICD-10-CM | POA: Insufficient documentation

## 2016-11-22 DIAGNOSIS — M542 Cervicalgia: Secondary | ICD-10-CM | POA: Insufficient documentation

## 2016-11-22 DIAGNOSIS — G8929 Other chronic pain: Secondary | ICD-10-CM | POA: Insufficient documentation

## 2016-11-22 DIAGNOSIS — M25512 Pain in left shoulder: Secondary | ICD-10-CM | POA: Insufficient documentation

## 2016-11-22 NOTE — Therapy (Addendum)
[place], [place], [address] Phone: [phone]   Fax:  [phone]  Physical Therapy Evaluation and Discharge  Patient Details  Name: Gary Davis MRN: [id number] Date of Birth: Mar 30, 1956 No Data Recorded  Encounter Date: 11/22/2016      PT End of Session - 11/22/16 1139    Visit Number 1   Number of Visits 16   Date for PT Re-Evaluation 01/17/17   PT Start Time 0940   PT Stop Time 8828   PT Time Calculation (min) 55 min   Activity Tolerance Patient tolerated treatment well;No increased pain   Behavior During Therapy [facility] for tasks assessed/performed;Anxious      Past Medical History:  Diagnosis Date  . Alcohol abuse   . Arthritis    "both knees; joints in hands; back of my neck; lower back" (11/04/2015)  . Cataract   . Chronic back pain    "upper and lower" (11/04/2015)  . Depression   . Fractures 04/2015   neck fracture  . GERD (gastroesophageal reflux disease)     Past Surgical History:  Procedure Laterality Date  . ARTHROSCOPIC REPAIR ACL Right   . BACK SURGERY    . FINGER FRACTURE SURGERY Right    4th Finger  . FRACTURE SURGERY    . JOINT REPLACEMENT    . MANDIBLE FRACTURE SURGERY  1990s  . ORBITAL FRACTURE SURGERY Right ~ 1970  . ORIF ANKLE FRACTURE Right   . POSTERIOR CERVICAL FUSION/FORAMINOTOMY N/A 02/09/2014   Procedure: POSTERIOR CERVICAL FUSION/FORAMINOTOMY LEVEL 4  Cervical three to seven decompression and fusion with lateral mass screws;  Surgeon: Elaina Hoops, MD;  Location: [place] [facility];  Service: Neurosurgery;  Laterality: N/A;  . POSTERIOR CERVICAL FUSION/FORAMINOTOMY N/A 04/15/2015   Procedure: Posterior cervical fusion C6-T1 with instrumentation and arthrodesis, decompressive laminectomy C7-T1, Foraminotomy C8-T1;  Surgeon: Kary Kos, MD;  Location: [facility];  Service: Neurosurgery;  Laterality: N/A;  . TONSILLECTOMY    . TOTAL KNEE ARTHROPLASTY Right 11/04/2015   . TOTAL KNEE ARTHROPLASTY Right 11/04/2015   Procedure: RIGHT TOTAL KNEE ARTHROPLASTY;  Surgeon: Leandrew Koyanagi, MD;  Location: [facility];  Service: Orthopedics;  Laterality: Right;    There were no vitals filed for this visit.       Subjective Assessment - 11/22/16 0941    Subjective I have a lot of issues the last couple of years. I am here primarily for treatment of my neck and shoulder pain L > R. The neck and shoulders are tight and sore all the time and the pain goes together. The neck refers to the shoulder.   Pertinent History 2016 pt broke cervical spine after cervical collar was removed from 2015 cervical surgery;  2015 he had spinal surgery to repair 2 bulging discs with titanium placement with resultant UE radiation bil R > L   Limitations Reading;Lifting;Sitting;Walking;Writing;House hold activities   How long can you sit comfortably? < 20 min with completing transfer with hands   How long can you stand comfortably? 15   How long can you walk comfortably? 1 block   Currently in Pain? Yes   Pain Score 9    Pain Location Neck   Pain Orientation Mid   Pain Descriptors / Indicators Tightness;Throbbing;Tingling;Constant;Aching   Pain Type Chronic pain   Pain Radiating Towards bil UEs L > R   Pain Onset 1 to 4 weeks ago   Pain Frequency Constant   Aggravating Factors  sitting  too long, holding even a jar of jelly, any activities   Pain Relieving Factors lay supine   Effect of Pain on Daily Activities pt is unable to perform any activities without pain.            [facility] PT Assessment - 11/22/16 0001      Assessment   Medical Diagnosis shoulder pain   Hand Dominance Right   Next MD Visit 12/11/16     Precautions   Precautions None     Restrictions   Weight Bearing Restrictions No     Balance Screen   Has the patient fallen in the past 6 months Yes   How many times? 5   Has the patient had a decrease in activity level because of a fear of falling?  Yes  R knee got weak  and knee buckled   Is the patient reluctant to leave their home because of a fear of falling?  Yes     [place] residence   Additional Comments no steps; parking lot leads to apartment     Prior Function   Level of Independence Other (comment)  MD determining if need care aid   Leisure unable to cook due to UE desensitization and difficulty with using UEs R hand especially; very guarded with all movements; R knee buckles in WB leading to falls     Cognition   Overall Cognitive Status Within Functional Limits for tasks assessed     Observation/Other Assessments   Observations very guarded movements; Upper Trap elevation and tightness      Sensation   Light Touch Impaired Detail   Additional Comments sensation absent in R hand, minimal grip strength     Coordination   Gross Motor Movements are Fluid and Coordinated No   Fine Motor Movements are Fluid and Coordinated No     Posture/Postural Control   Posture Comments squared shoulders, limited mobility L UE; grip strength R 14 force; L  19     ROM / Strength   AROM / PROM / Strength AROM;PROM;Strength     AROM   Overall AROM Comments R shoulder flex 121, ER T2; L flex 110, ER C6; bil IR L2. Cervical: flex 31, ext 12, R rot 22, L rot 38, R lat flex 16 and L 14     Strength   Overall Strength Comments Bil UE strength 2-/5 throughout but pt reports R UE to be functionally weaker     Flexibility   Soft Tissue Assessment /Muscle Length --  pt is very tight with all movements and guarded     Palpation   Spinal mobility flat cervical spine s/p cervical surgery   Palpation comment pt with tight Upper Traps bil with tight Pecs bil; all motions are guarded; pt noted to not use R hand much with mobility                   [facility] Adult PT Treatment/Exercise - 11/22/16 0001      Exercises   Exercises --  see pt education section     Manual Therapy   Manual therapy comments soft  tissue work to bil Upper Trap/Rhomboids with pt with multiple tight bands and trigger points with reports of tenderness and tightness                PT Education - 11/22/16 1026    Education provided Yes   Education Details Cervical AROM: cervical flex, lateral  flex, rotation 10x 2x/day, Levator stretch 2x 30 sec 2x/day, scap retract x 10 2x/day. advised pt to get HMP and use for 15-20 min as needed and not to sleep on it   Person(s) Educated Patient   Methods Explanation;Demonstration;Handout   Comprehension Verbalized understanding;Returned demonstration          PT Short Term Goals - 11/22/16 1127      PT SHORT TERM GOAL #1   Title pt will be I with initial HEP to assist with improved cervical and shoulder ROM to assist with ADLs   Baseline issued at eval   Time 4   Period Weeks   Status New     PT SHORT TERM GOAL #2   Title Pt will demo improved bil shoulder flex >/= 145 degrees to assist with reaching into cabinets   Baseline R 121 and L 110   Time 4   Period Weeks   Status New     PT SHORT TERM GOAL #3   Title Pt will demo improved cervical flexion to >/= 40 degrees to assist with looking at floor   Baseline 31 degrees   Time 4   Period Weeks   Status New     PT SHORT TERM GOAL #4   Title Pt will report at least 40% reduction in shoulder pain with activities    Baseline 0%   Time 4   Period Weeks   Status New           PT Long Term Goals - 11/22/16 1130      PT LONG TERM GOAL #1   Title pt will demo improved bil shoulder strength >/= 3+/5 to assist with transfers from seat off of bus   Baseline 2-/5   Time 8   Period Weeks     PT LONG TERM GOAL #2   Title Pt will report </= 5/10 bil shoulder/cervical pain to assist with performance of ADLs   Baseline up to 9/10 shoulder/cervical pain   Time 8   Period Weeks   Status New     PT LONG TERM GOAL #3   Title Pt will demo improved shoulder restriction to 41% limitation to assist with improved  ADLs/iADLs at home   Baseline Pt is 68% limited in shoulder impairment per FOTO   Time 8   Period Weeks   Status New               Plan - 11/22/16 1040    Clinical Impression Statement Pt demos extensive Upper Trap tightness/Pec tightness which is limiting ROM along with UE weakness which is hindering functional mobility including transfers. Pt would benefit from PT for cervical/UE ROM, pain management and strengthening due to guarded movements and limited ROM which is hindering functional mobility.   Rehab Potential Good   Clinical Impairments Affecting Rehab Potential copay; pain   PT Frequency 2x / week   PT Duration 8 weeks   PT Treatment/Interventions ADLs/Self Care Home Management;Electrical Stimulation;Moist Heat;Neuromuscular re-education;Therapeutic exercise;Balance training;Therapeutic activities;Functional mobility training;Gait training;Patient/family education;Manual techniques;Passive range of motion   PT Next Visit Plan review HEP, manual cervical ROM, wall ladder; pt with high risk of falls. Do Berg. May try RW to decrease falls risk if pt is able to grip walker handle.   PT Home Exercise Plan see pt education section   Consulted and Agree with Plan of Care Patient      Patient will benefit from skilled therapeutic intervention in order to improve the following  deficits and impairments:  Decreased activity tolerance, Decreased cognition, Decreased balance, Decreased coordination, Decreased mobility, Decreased strength, Difficulty walking, Impaired flexibility, Increased muscle spasms, Hypomobility, Impaired UE functional use, Improper body mechanics, Postural dysfunction, Pain  Visit Diagnosis: Chronic left shoulder pain - Plan: PT plan of care cert/re-cert  Chronic right shoulder pain - Plan: PT plan of care cert/re-cert  Cervicalgia - Plan: PT plan of care cert/re-cert  Muscle weakness (generalized) - Plan: PT plan of care cert/re-cert      G-Codes -  14-Dec-2016 1140    Functional Assessment Tool Used (Outpatient Only) FOTO   Functional Limitation Carrying, moving and handling objects   Carrying, Moving and Handling Objects Current Status (W9791) At least 60 percent but less than 80 percent impaired, limited or restricted   Carrying, Moving and Handling Objects Goal Status (R0413) At least 40 percent but less than 60 percent impaired, limited or restricted       Problem List Patient Active Problem List   Diagnosis Date Noted  . Osteoarthritis of right knee 11/04/2015  . Total knee replacement status 11/04/2015  . Cervical spine fracture, initial encounter 04/15/2015  . Fall 11/02/2014  . Rib fractures 10/31/2014  . Esophageal reflux 03/02/2014  . Anemia, unspecified 03/02/2014  . Insomnia 03/02/2014  . IFG (impaired fasting glucose) 03/02/2014  . Central cord syndrome ([place]) 02/11/2014  . Myelopathy, spondylogenic, cervical 02/09/2014  . Bilateral arm weakness 02/04/2014  . Cord compression syndrome ([place]) 02/04/2014  . Tobacco use disorder 02/04/2014  . Arthritis 02/04/2014    Myra Rude, PT 12/14/2016, 11:55 AM  [facility] [address] [place], [place], [address] Phone: [phone]   Fax:  [phone]  Name: Gary Davis MRN: [id number] Date of Birth: 06-26-1956   PHYSICAL THERAPY DISCHARGE SUMMARY  Visits from Start of Care: 1  Current functional level related to goals / functional outcomes: See above   Remaining deficits: Unknown, did not return   Education / Equipment: HEP  Plan: Patient agrees to discharge.  Patient goals were not met. Patient is being discharged due to not returning since the last visit.  ?????    Raeford Razor, PT 12/26/16 3:23 PM Phone: [phone] Fax: [phone]

## 2016-12-04 ENCOUNTER — Ambulatory Visit: Payer: Medicare HMO | Attending: Primary Care | Admitting: Physical Therapy

## 2016-12-12 ENCOUNTER — Ambulatory Visit: Payer: Medicare HMO | Admitting: Physical Therapy

## 2016-12-12 ENCOUNTER — Telehealth: Payer: Self-pay | Admitting: Physical Therapy

## 2016-12-12 NOTE — Telephone Encounter (Signed)
Left message on recording about missed visit today.  Reminded him of his next appointment. Asked him to call if he is unable to attend. Liz BeachKaren Qaadir Kent PTA

## 2016-12-18 ENCOUNTER — Telehealth: Payer: Self-pay | Admitting: Physical Therapy

## 2016-12-18 ENCOUNTER — Ambulatory Visit: Payer: Medicare HMO | Admitting: Physical Therapy

## 2016-12-18 NOTE — Telephone Encounter (Signed)
Patient has no showed x 3 for his PT appts.  He will be DC by Wed 12/20/16 if he does not call and reschedule.  Karie MainlandJennifer Jachelle Fluty, PT 12/18/16 3:00 PM Phone: [phone] Fax: [phone]

## 2016-12-25 ENCOUNTER — Ambulatory Visit: Payer: Medicare HMO | Admitting: Physical Therapy

## 2017-11-20 ENCOUNTER — Ambulatory Visit (INDEPENDENT_AMBULATORY_CARE_PROVIDER_SITE_OTHER): Payer: Medicare HMO

## 2017-11-20 ENCOUNTER — Ambulatory Visit (INDEPENDENT_AMBULATORY_CARE_PROVIDER_SITE_OTHER): Payer: Medicare HMO | Admitting: Orthopaedic Surgery

## 2017-11-20 ENCOUNTER — Encounter (INDEPENDENT_AMBULATORY_CARE_PROVIDER_SITE_OTHER): Payer: Self-pay | Admitting: Orthopaedic Surgery

## 2017-11-20 DIAGNOSIS — Z96651 Presence of right artificial knee joint: Secondary | ICD-10-CM

## 2017-11-20 NOTE — Progress Notes (Signed)
Office Visit Note   Patient: Gary Davis           Date of Birth: August 06, 1955           MRN: [id number] Visit Date: 11/20/2017              Requested by: Medicine, [facility] [address] [place], [place] [address] PCP: [facility], [facility]   Assessment & Plan: Visit Diagnoses:  1. Status post total right knee replacement     Plan: Patient is 2 years status post right total knee replacement doing well.  I think his main issue is still coming from relative weakness of aches quadriceps.  I encouraged him to continue to work on strengthening.  He may discontinue dental prophylaxis at this point  I will see him back on a yearly basis with 2 view x-rays of the right knee.  Follow-Up Instructions: Return in about 1 year (around 11/21/2018).   Orders:  Orders Placed This Encounter  Procedures  . XR Knee 1-2 Views Right   No orders of the defined types were placed in this encounter.     Procedures: No procedures performed   Clinical Data: No additional findings.   Subjective: Chief Complaint  Patient presents with  . Right Knee - Pain    Patient is 2 years status post right total knee replacement.  Overall he is doing well.  He does still endorse some quadriceps weakness.  He does endorse some occasional sharp and stinging pain.   Review of Systems   Objective: Vital Signs: There were no vitals taken for this visit.  Physical Exam  Ortho Exam Right knee exam shows a fully healed surgical scar.  He has excellent range of motion.  Collaterals are stable. Specialty Comments:  No specialty comments available.  Imaging: Xr Knee 1-2 Views Right  Result Date: 11/20/2017 Stable right total knee replacement without complication    PMFS History: Patient Active Problem List   Diagnosis Date Noted  . Osteoarthritis of right knee 11/04/2015  . Total knee replacement status 11/04/2015  . Cervical spine fracture, initial encounter  04/15/2015  . Fall 11/02/2014  . Rib fractures 10/31/2014  . Esophageal reflux 03/02/2014  . Anemia, unspecified 03/02/2014  . Insomnia 03/02/2014  . IFG (impaired fasting glucose) 03/02/2014  . Central cord syndrome (HCC) 02/11/2014  . Myelopathy, spondylogenic, cervical 02/09/2014  . Bilateral arm weakness 02/04/2014  . Cord compression syndrome (HCC) 02/04/2014  . Tobacco use disorder 02/04/2014  . Arthritis 02/04/2014   Past Medical History:  Diagnosis Date  . Alcohol abuse   . Arthritis    "both knees; joints in hands; back of my neck; lower back" (11/04/2015)  . Cataract   . Chronic back pain    "upper and lower" (11/04/2015)  . Depression   . Fractures 04/2015   neck fracture  . GERD (gastroesophageal reflux disease)     Family History  Problem Relation Age of Onset  . Cancer Mother        breast cancer    Past Surgical History:  Procedure Laterality Date  . ARTHROSCOPIC REPAIR ACL Right   . BACK SURGERY    . FINGER FRACTURE SURGERY Right    4th Finger  . FRACTURE SURGERY    . JOINT REPLACEMENT    . MANDIBLE FRACTURE SURGERY  1990s  . ORBITAL FRACTURE SURGERY Right ~ 1970  . ORIF ANKLE FRACTURE Right   . POSTERIOR CERVICAL FUSION/FORAMINOTOMY N/A 02/09/2014  Procedure: POSTERIOR CERVICAL FUSION/FORAMINOTOMY LEVEL 4  Cervical three to seven decompression and fusion with lateral mass screws;  Surgeon: Mariam Dollar, MD;  Location: [facility];  Service: Neurosurgery;  Laterality: N/A;  . POSTERIOR CERVICAL FUSION/FORAMINOTOMY N/A 04/15/2015   Procedure: Posterior cervical fusion C6-T1 with instrumentation and arthrodesis, decompressive laminectomy C7-T1, Foraminotomy C8-T1;  Surgeon: Donalee Citrin, MD;  Location: [facility];  Service: Neurosurgery;  Laterality: N/A;  . TONSILLECTOMY    . TOTAL KNEE ARTHROPLASTY Right 11/04/2015  . TOTAL KNEE ARTHROPLASTY Right 11/04/2015   Procedure: RIGHT TOTAL KNEE ARTHROPLASTY;  Surgeon: Tarry Kos, MD;  Location: [facility];  Service:  Orthopedics;  Laterality: Right;   Social History   Occupational History  . Not on file  Tobacco Use  . Smoking status: Former Smoker    Packs/day: 0.12    Years: 30.00    Pack years: 3.60    Types: Cigarettes    Last attempt to quit: 01/17/2016    Years since quitting: 1.8  . Smokeless tobacco: Never Used  . Tobacco comment: states he quit 3-4 months ago  Substance and Sexual Activity  . Alcohol use: Yes    Alcohol/week: 3.6 oz    Types: 6 Cans of beer per week  . Drug use: No  . Sexual activity: Not Currently

## 2018-11-21 ENCOUNTER — Ambulatory Visit: Payer: Self-pay | Admitting: Orthopedic Surgery

## 2019-07-26 ENCOUNTER — Other Ambulatory Visit: Payer: Self-pay | Admitting: Family Medicine

## 2019-07-26 DIAGNOSIS — M5030 Other cervical disc degeneration, unspecified cervical region: Secondary | ICD-10-CM

## 2019-08-04 DIAGNOSIS — J9600 Acute respiratory failure, unspecified whether with hypoxia or hypercapnia: Secondary | ICD-10-CM

## 2019-08-04 HISTORY — DX: Acute respiratory failure, unspecified whether with hypoxia or hypercapnia: J96.00

## 2019-08-15 ENCOUNTER — Other Ambulatory Visit: Payer: Self-pay

## 2019-08-23 ENCOUNTER — Emergency Department (HOSPITAL_COMMUNITY): Payer: Medicare Other

## 2019-08-23 ENCOUNTER — Inpatient Hospital Stay (HOSPITAL_COMMUNITY)
Admission: EM | Admit: 2019-08-23 | Discharge: 2019-09-17 | DRG: 917 | Disposition: A | Discharge status: Home Health | Payer: Medicare Other | Attending: Internal Medicine | Admitting: Internal Medicine

## 2019-08-23 ENCOUNTER — Inpatient Hospital Stay (HOSPITAL_COMMUNITY): Payer: Medicare Other

## 2019-08-23 ENCOUNTER — Other Ambulatory Visit: Payer: Self-pay

## 2019-08-23 DIAGNOSIS — I5023 Acute on chronic systolic (congestive) heart failure: Secondary | ICD-10-CM | POA: Diagnosis present

## 2019-08-23 DIAGNOSIS — M1711 Unilateral primary osteoarthritis, right knee: Secondary | ICD-10-CM | POA: Diagnosis not present

## 2019-08-23 DIAGNOSIS — Z9911 Dependence on respirator [ventilator] status: Secondary | ICD-10-CM | POA: Diagnosis not present

## 2019-08-23 DIAGNOSIS — M549 Dorsalgia, unspecified: Secondary | ICD-10-CM | POA: Diagnosis present

## 2019-08-23 DIAGNOSIS — S2232XA Fracture of one rib, left side, initial encounter for closed fracture: Secondary | ICD-10-CM | POA: Diagnosis present

## 2019-08-23 DIAGNOSIS — J690 Pneumonitis due to inhalation of food and vomit: Secondary | ICD-10-CM | POA: Diagnosis present

## 2019-08-23 DIAGNOSIS — R579 Shock, unspecified: Secondary | ICD-10-CM | POA: Diagnosis not present

## 2019-08-23 DIAGNOSIS — N320 Bladder-neck obstruction: Secondary | ICD-10-CM | POA: Diagnosis present

## 2019-08-23 DIAGNOSIS — G9341 Metabolic encephalopathy: Secondary | ICD-10-CM | POA: Diagnosis present

## 2019-08-23 DIAGNOSIS — I468 Cardiac arrest due to other underlying condition: Secondary | ICD-10-CM | POA: Diagnosis present

## 2019-08-23 DIAGNOSIS — R4182 Altered mental status, unspecified: Secondary | ICD-10-CM | POA: Diagnosis present

## 2019-08-23 DIAGNOSIS — R4189 Other symptoms and signs involving cognitive functions and awareness: Secondary | ICD-10-CM | POA: Diagnosis not present

## 2019-08-23 DIAGNOSIS — J9600 Acute respiratory failure, unspecified whether with hypoxia or hypercapnia: Secondary | ICD-10-CM | POA: Diagnosis present

## 2019-08-23 DIAGNOSIS — T510X1A Toxic effect of ethanol, accidental (unintentional), initial encounter: Secondary | ICD-10-CM | POA: Diagnosis present

## 2019-08-23 DIAGNOSIS — E875 Hyperkalemia: Secondary | ICD-10-CM | POA: Diagnosis present

## 2019-08-23 DIAGNOSIS — F17200 Nicotine dependence, unspecified, uncomplicated: Secondary | ICD-10-CM | POA: Diagnosis not present

## 2019-08-23 DIAGNOSIS — Z79891 Long term (current) use of opiate analgesic: Secondary | ICD-10-CM

## 2019-08-23 DIAGNOSIS — M1990 Unspecified osteoarthritis, unspecified site: Secondary | ICD-10-CM | POA: Diagnosis present

## 2019-08-23 DIAGNOSIS — Z20822 Contact with and (suspected) exposure to covid-19: Secondary | ICD-10-CM | POA: Diagnosis present

## 2019-08-23 DIAGNOSIS — G8929 Other chronic pain: Secondary | ICD-10-CM | POA: Diagnosis present

## 2019-08-23 DIAGNOSIS — E872 Acidosis, unspecified: Secondary | ICD-10-CM

## 2019-08-23 DIAGNOSIS — E876 Hypokalemia: Secondary | ICD-10-CM | POA: Diagnosis present

## 2019-08-23 DIAGNOSIS — S37019A Minor contusion of unspecified kidney, initial encounter: Secondary | ICD-10-CM | POA: Diagnosis not present

## 2019-08-23 DIAGNOSIS — Z79899 Other long term (current) drug therapy: Secondary | ICD-10-CM

## 2019-08-23 DIAGNOSIS — J9601 Acute respiratory failure with hypoxia: Secondary | ICD-10-CM | POA: Diagnosis present

## 2019-08-23 DIAGNOSIS — F1010 Alcohol abuse, uncomplicated: Secondary | ICD-10-CM | POA: Diagnosis present

## 2019-08-23 DIAGNOSIS — I9589 Other hypotension: Secondary | ICD-10-CM

## 2019-08-23 DIAGNOSIS — W19XXXA Unspecified fall, initial encounter: Secondary | ICD-10-CM | POA: Diagnosis present

## 2019-08-23 DIAGNOSIS — S37012A Minor contusion of left kidney, initial encounter: Secondary | ICD-10-CM | POA: Diagnosis present

## 2019-08-23 DIAGNOSIS — T796XXA Traumatic ischemia of muscle, initial encounter: Secondary | ICD-10-CM | POA: Diagnosis not present

## 2019-08-23 DIAGNOSIS — N179 Acute kidney failure, unspecified: Secondary | ICD-10-CM | POA: Diagnosis not present

## 2019-08-23 DIAGNOSIS — E162 Hypoglycemia, unspecified: Secondary | ICD-10-CM | POA: Diagnosis present

## 2019-08-23 DIAGNOSIS — Z96651 Presence of right artificial knee joint: Secondary | ICD-10-CM | POA: Diagnosis present

## 2019-08-23 DIAGNOSIS — R339 Retention of urine, unspecified: Secondary | ICD-10-CM | POA: Diagnosis not present

## 2019-08-23 DIAGNOSIS — F1420 Cocaine dependence, uncomplicated: Secondary | ICD-10-CM | POA: Diagnosis present

## 2019-08-23 DIAGNOSIS — G931 Anoxic brain damage, not elsewhere classified: Secondary | ICD-10-CM | POA: Diagnosis present

## 2019-08-23 DIAGNOSIS — J9602 Acute respiratory failure with hypercapnia: Secondary | ICD-10-CM | POA: Diagnosis not present

## 2019-08-23 DIAGNOSIS — Z9289 Personal history of other medical treatment: Secondary | ICD-10-CM

## 2019-08-23 DIAGNOSIS — F329 Major depressive disorder, single episode, unspecified: Secondary | ICD-10-CM | POA: Diagnosis present

## 2019-08-23 DIAGNOSIS — I361 Nonrheumatic tricuspid (valve) insufficiency: Secondary | ICD-10-CM | POA: Diagnosis not present

## 2019-08-23 DIAGNOSIS — R911 Solitary pulmonary nodule: Secondary | ICD-10-CM | POA: Diagnosis not present

## 2019-08-23 DIAGNOSIS — K219 Gastro-esophageal reflux disease without esophagitis: Secondary | ICD-10-CM | POA: Diagnosis present

## 2019-08-23 DIAGNOSIS — I5021 Acute systolic (congestive) heart failure: Secondary | ICD-10-CM | POA: Diagnosis not present

## 2019-08-23 DIAGNOSIS — J15211 Pneumonia due to Methicillin susceptible Staphylococcus aureus: Secondary | ICD-10-CM | POA: Diagnosis not present

## 2019-08-23 DIAGNOSIS — R079 Chest pain, unspecified: Secondary | ICD-10-CM | POA: Diagnosis not present

## 2019-08-23 DIAGNOSIS — Z4659 Encounter for fitting and adjustment of other gastrointestinal appliance and device: Secondary | ICD-10-CM

## 2019-08-23 DIAGNOSIS — I480 Paroxysmal atrial fibrillation: Secondary | ICD-10-CM | POA: Diagnosis not present

## 2019-08-23 DIAGNOSIS — M25531 Pain in right wrist: Secondary | ICD-10-CM | POA: Diagnosis not present

## 2019-08-23 DIAGNOSIS — Z87891 Personal history of nicotine dependence: Secondary | ICD-10-CM

## 2019-08-23 DIAGNOSIS — M25539 Pain in unspecified wrist: Secondary | ICD-10-CM

## 2019-08-23 DIAGNOSIS — N170 Acute kidney failure with tubular necrosis: Secondary | ICD-10-CM | POA: Diagnosis present

## 2019-08-23 DIAGNOSIS — D649 Anemia, unspecified: Secondary | ICD-10-CM | POA: Diagnosis present

## 2019-08-23 DIAGNOSIS — M6282 Rhabdomyolysis: Secondary | ICD-10-CM | POA: Diagnosis present

## 2019-08-23 DIAGNOSIS — G9340 Encephalopathy, unspecified: Secondary | ICD-10-CM | POA: Diagnosis not present

## 2019-08-23 DIAGNOSIS — T68XXXA Hypothermia, initial encounter: Secondary | ICD-10-CM

## 2019-08-23 DIAGNOSIS — I469 Cardiac arrest, cause unspecified: Secondary | ICD-10-CM | POA: Diagnosis not present

## 2019-08-23 DIAGNOSIS — J9690 Respiratory failure, unspecified, unspecified whether with hypoxia or hypercapnia: Secondary | ICD-10-CM

## 2019-08-23 DIAGNOSIS — E860 Dehydration: Secondary | ICD-10-CM | POA: Diagnosis not present

## 2019-08-23 DIAGNOSIS — I340 Nonrheumatic mitral (valve) insufficiency: Secondary | ICD-10-CM | POA: Diagnosis not present

## 2019-08-23 DIAGNOSIS — D696 Thrombocytopenia, unspecified: Secondary | ICD-10-CM | POA: Diagnosis present

## 2019-08-23 DIAGNOSIS — E874 Mixed disorder of acid-base balance: Secondary | ICD-10-CM | POA: Diagnosis present

## 2019-08-23 DIAGNOSIS — T405X1A Poisoning by cocaine, accidental (unintentional), initial encounter: Principal | ICD-10-CM | POA: Diagnosis present

## 2019-08-23 LAB — CBC WITH DIFFERENTIAL/PLATELET
Abs Immature Granulocytes: 0 10*3/uL (ref 0.00–0.07)
Basophils Absolute: 0 10*3/uL (ref 0.0–0.1)
Basophils Relative: 0 %
Eosinophils Absolute: 0 10*3/uL (ref 0.0–0.5)
Eosinophils Relative: 0 %
HCT: 50.5 % (ref 39.0–52.0)
Hemoglobin: 15.1 g/dL (ref 13.0–17.0)
Lymphocytes Relative: 14 %
Lymphs Abs: 1.7 10*3/uL (ref 0.7–4.0)
MCH: 33.6 pg (ref 26.0–34.0)
MCHC: 29.9 g/dL — ABNORMAL LOW (ref 30.0–36.0)
MCV: 112.5 fL — ABNORMAL HIGH (ref 80.0–100.0)
Monocytes Absolute: 2 10*3/uL — ABNORMAL HIGH (ref 0.1–1.0)
Monocytes Relative: 16 %
Neutro Abs: 8.7 10*3/uL — ABNORMAL HIGH (ref 1.7–7.7)
Neutrophils Relative %: 70 %
Platelets: 214 10*3/uL (ref 150–400)
RBC: 4.49 MIL/uL (ref 4.22–5.81)
RDW: 13.3 % (ref 11.5–15.5)
WBC: 12.4 10*3/uL — ABNORMAL HIGH (ref 4.0–10.5)
nRBC: 0.2 % (ref 0.0–0.2)
nRBC: 2 /100 WBC — ABNORMAL HIGH

## 2019-08-23 LAB — COMPREHENSIVE METABOLIC PANEL
ALT: 69 U/L — ABNORMAL HIGH (ref 0–44)
AST: 178 U/L — ABNORMAL HIGH (ref 15–41)
Albumin: 3.8 g/dL (ref 3.5–5.0)
Alkaline Phosphatase: 113 U/L (ref 38–126)
Anion gap: 26 — ABNORMAL HIGH (ref 5–15)
BUN: 16 mg/dL (ref 8–23)
CO2: 13 mmol/L — ABNORMAL LOW (ref 22–32)
Calcium: 9.2 mg/dL (ref 8.9–10.3)
Chloride: 105 mmol/L (ref 98–111)
Creatinine, Ser: 3.64 mg/dL — ABNORMAL HIGH (ref 0.61–1.24)
GFR calc Af Amer: 19 mL/min — ABNORMAL LOW (ref >60)
GFR calc non Af Amer: 16 mL/min — ABNORMAL LOW (ref >60)
Glucose, Bld: 77 mg/dL (ref 70–99)
Potassium: 4.4 mmol/L (ref 3.5–5.1)
Sodium: 144 mmol/L (ref 135–145)
Total Bilirubin: 0.9 mg/dL (ref 0.3–1.2)
Total Protein: 7.3 g/dL (ref 6.5–8.1)

## 2019-08-23 LAB — POCT I-STAT 7, (LYTES, BLD GAS, ICA,H+H)
Acid-base deficit: 16 mmol/L — ABNORMAL HIGH (ref 0.0–2.0)
Acid-base deficit: 15 mmol/L — ABNORMAL HIGH (ref 0.0–2.0)
Bicarbonate: 13.7 mmol/L — ABNORMAL LOW (ref 20.0–28.0)
Bicarbonate: 11.6 mmol/L — ABNORMAL LOW (ref 20.0–28.0)
Calcium, Ion: 1.08 mmol/L — ABNORMAL LOW (ref 1.15–1.40)
Calcium, Ion: 1.04 mmol/L — ABNORMAL LOW (ref 1.15–1.40)
HCT: 38 % — ABNORMAL LOW (ref 39.0–52.0)
HCT: 40 % (ref 39.0–52.0)
Hemoglobin: 12.9 g/dL — ABNORMAL LOW (ref 13.0–17.0)
Hemoglobin: 13.6 g/dL (ref 13.0–17.0)
O2 Saturation: 100 %
O2 Saturation: 100 %
Patient temperature: 86.8
Potassium: 4.1 mmol/L (ref 3.5–5.1)
Potassium: 6.5 mmol/L (ref 3.5–5.1)
Sodium: 140 mmol/L (ref 135–145)
Sodium: 136 mmol/L (ref 135–145)
TCO2: 15 mmol/L — ABNORMAL LOW (ref 22–32)
TCO2: 12 mmol/L — ABNORMAL LOW (ref 22–32)
pCO2 arterial: 47.4 mmHg (ref 32.0–48.0)
pCO2 arterial: 21.4 mmHg — ABNORMAL LOW (ref 32.0–48.0)
pH, Arterial: 7.07 — CL (ref 7.350–7.450)
pH, Arterial: 7.306 — ABNORMAL LOW (ref 7.350–7.450)
pO2, Arterial: 576 mmHg — ABNORMAL HIGH (ref 83.0–108.0)
pO2, Arterial: 209 mmHg — ABNORMAL HIGH (ref 83.0–108.0)

## 2019-08-23 LAB — URINALYSIS, ROUTINE W REFLEX MICROSCOPIC
Bilirubin Urine: NEGATIVE
Glucose, UA: NEGATIVE mg/dL
Hgb urine dipstick: NEGATIVE
Ketones, ur: NEGATIVE mg/dL
Leukocytes,Ua: NEGATIVE
Nitrite: NEGATIVE
Protein, ur: NEGATIVE mg/dL
Specific Gravity, Urine: 1.006 (ref 1.005–1.030)
pH: 6 (ref 5.0–8.0)

## 2019-08-23 LAB — I-STAT CHEM 8, ED
BUN: 21 mg/dL (ref 8–23)
Calcium, Ion: 1.08 mmol/L — ABNORMAL LOW (ref 1.15–1.40)
Chloride: 108 mmol/L (ref 98–111)
Creatinine, Ser: 3.4 mg/dL — ABNORMAL HIGH (ref 0.61–1.24)
Glucose, Bld: 69 mg/dL — ABNORMAL LOW (ref 70–99)
HCT: 48 % (ref 39.0–52.0)
Hemoglobin: 16.3 g/dL (ref 13.0–17.0)
Potassium: 4.3 mmol/L (ref 3.5–5.1)
Sodium: 142 mmol/L (ref 135–145)
TCO2: 18 mmol/L — ABNORMAL LOW (ref 22–32)

## 2019-08-23 LAB — POCT I-STAT EG7
Acid-base deficit: 21 mmol/L — ABNORMAL HIGH (ref 0.0–2.0)
Bicarbonate: 15.1 mmol/L — ABNORMAL LOW (ref 20.0–28.0)
Calcium, Ion: 1.11 mmol/L — ABNORMAL LOW (ref 1.15–1.40)
HCT: 47 % (ref 39.0–52.0)
Hemoglobin: 16 g/dL (ref 13.0–17.0)
O2 Saturation: 98 %
Potassium: 4.2 mmol/L (ref 3.5–5.1)
Sodium: 142 mmol/L (ref 135–145)
TCO2: 18 mmol/L — ABNORMAL LOW (ref 22–32)
pCO2, Ven: 92.8 mmHg (ref 44.0–60.0)
pH, Ven: 6.819 — CL (ref 7.250–7.430)
pO2, Ven: 198 mmHg — ABNORMAL HIGH (ref 32.0–45.0)

## 2019-08-23 LAB — CBG MONITORING, ED
Glucose-Capillary: 205 mg/dL — ABNORMAL HIGH (ref 70–99)
Glucose-Capillary: 69 mg/dL — ABNORMAL LOW (ref 70–99)

## 2019-08-23 LAB — GLUCOSE, CAPILLARY
Glucose-Capillary: 192 mg/dL — ABNORMAL HIGH (ref 70–99)
Glucose-Capillary: 170 mg/dL — ABNORMAL HIGH (ref 70–99)

## 2019-08-23 LAB — APTT: aPTT: 28 seconds (ref 24–36)

## 2019-08-23 LAB — PROTIME-INR
INR: 1.4 — ABNORMAL HIGH (ref 0.8–1.2)
Prothrombin Time: 16.6 seconds — ABNORMAL HIGH (ref 11.4–15.2)

## 2019-08-23 LAB — RAPID URINE DRUG SCREEN, HOSP PERFORMED
Amphetamines: NOT DETECTED
Barbiturates: NOT DETECTED
Benzodiazepines: NOT DETECTED
Cocaine: POSITIVE — AB
Opiates: NOT DETECTED
Tetrahydrocannabinol: NOT DETECTED

## 2019-08-23 LAB — CK: Total CK: 1220 U/L — ABNORMAL HIGH (ref 49–397)

## 2019-08-23 LAB — TYPE AND SCREEN
ABO/RH(D): A NEG
Antibody Screen: NEGATIVE

## 2019-08-23 LAB — RESPIRATORY PANEL BY RT PCR (FLU A&B, COVID)
Influenza A by PCR: NEGATIVE
Influenza B by PCR: NEGATIVE
SARS Coronavirus 2 by RT PCR: NEGATIVE

## 2019-08-23 LAB — LACTIC ACID, PLASMA
Lactic Acid, Venous: >11 mmol/L (ref 0.5–1.9)
Lactic Acid, Venous: >11 mmol/L (ref 0.5–1.9)
Lactic Acid, Venous: 9.1 mmol/L (ref 0.5–1.9)

## 2019-08-23 LAB — ACETAMINOPHEN LEVEL: Acetaminophen (Tylenol), Serum: <10 ug/mL — ABNORMAL LOW (ref 10–30)

## 2019-08-23 LAB — SALICYLATE LEVEL: Salicylate Lvl: <7 mg/dL — ABNORMAL LOW (ref 7.0–30.0)

## 2019-08-23 LAB — OSMOLALITY: Osmolality: 312 mOsm/kg — ABNORMAL HIGH (ref 275–295)

## 2019-08-23 LAB — ETHANOL: Alcohol, Ethyl (B): <10 mg/dL (ref <10)

## 2019-08-23 LAB — TROPONIN I (HIGH SENSITIVITY)
Troponin I (High Sensitivity): 171 ng/L (ref <18)
Troponin I (High Sensitivity): 210 ng/L (ref <18)

## 2019-08-23 MED ORDER — FENTANYL CITRATE (PF) 100 MCG/2ML IJ SOLN
25.0000 ug | INTRAMUSCULAR | Status: DC | PRN
  Administered 2019-08-24 – 2019-08-25 (×2): 100 ug via INTRAVENOUS
  Administered 2019-08-27 (×4): 50 ug via INTRAVENOUS
  Administered 2019-08-28: 100 ug via INTRAVENOUS
  Filled 2019-08-23 (×2): qty 2

## 2019-08-23 MED ORDER — CHLORHEXIDINE GLUCONATE 0.12% ORAL RINSE (MEDLINE KIT)
15.0000 mL | Freq: Two times a day (BID) | OROMUCOSAL | Status: DC
  Administered 2019-08-23 – 2019-08-29 (×12): 15 mL via OROMUCOSAL

## 2019-08-23 MED ORDER — ROCURONIUM BROMIDE 50 MG/5ML IV SOLN
INTRAVENOUS | Status: AC | PRN
  Administered 2019-08-23: 80 mg via INTRAVENOUS

## 2019-08-23 MED ORDER — SODIUM CHLORIDE 0.9 % IV SOLN: 2.0000 g | INTRAVENOUS | Status: DC

## 2019-08-23 MED ORDER — NOREPINEPHRINE 4 MG/250ML-% IV SOLN
INTRAVENOUS | Status: AC
  Administered 2019-08-23: 50 ug/kg/min
  Filled 2019-08-23: qty 250

## 2019-08-23 MED ORDER — NOREPINEPHRINE 4 MG/250ML-% IV SOLN
0.0000 ug/kg/min | INTRAVENOUS | Status: DC
  Administered 2019-08-24: 06:00:00 48 ug/kg/min via INTRAVENOUS
  Filled 2019-08-23 (×2): qty 250

## 2019-08-23 MED ORDER — METRONIDAZOLE IN NACL 5-0.79 MG/ML-% IV SOLN
500.0000 mg | Freq: Once | INTRAVENOUS | Status: AC
  Administered 2019-08-23: 500 mg via INTRAVENOUS
  Filled 2019-08-23: qty 100

## 2019-08-23 MED ORDER — MIDAZOLAM HCL 2 MG/2ML IJ SOLN
1.0000 mg | INTRAMUSCULAR | Status: DC | PRN
  Administered 2019-08-24: 08:00:00 1 mg via INTRAVENOUS
  Filled 2019-08-23: qty 2

## 2019-08-23 MED ORDER — SODIUM BICARBONATE 8.4 % IV SOLN
INTRAVENOUS | Status: DC
  Filled 2019-08-23 (×3): qty 150

## 2019-08-23 MED ORDER — PANTOPRAZOLE SODIUM 40 MG PO PACK
40.0000 mg | PACK | ORAL | Status: DC
  Administered 2019-08-24 – 2019-08-28 (×5): 40 mg
  Filled 2019-08-23 (×5): qty 20

## 2019-08-23 MED ORDER — FENTANYL 2500MCG IN NS 250ML (10MCG/ML) PREMIX INFUSION: 25.0000 ug/h | INTRAVENOUS | Status: DC

## 2019-08-23 MED ORDER — SODIUM CHLORIDE 0.9 % IV SOLN: INTRAVENOUS | Status: DC | PRN

## 2019-08-23 MED ORDER — SODIUM CHLORIDE 0.9 % IV BOLUS: 1000.0000 mL | Freq: Once | INTRAVENOUS | Status: DC

## 2019-08-23 MED ORDER — HEPARIN SODIUM (PORCINE) 5000 UNIT/ML IJ SOLN
5000.0000 [IU] | Freq: Three times a day (TID) | INTRAMUSCULAR | Status: DC
  Administered 2019-08-24: 05:00:00 5000 [IU] via SUBCUTANEOUS
  Filled 2019-08-23: qty 1

## 2019-08-23 MED ORDER — ORAL CARE MOUTH RINSE
15.0000 mL | OROMUCOSAL | Status: DC
  Administered 2019-08-24 – 2019-08-29 (×57): 15 mL via OROMUCOSAL

## 2019-08-23 MED ORDER — SODIUM BICARBONATE-DEXTROSE 150-5 MEQ/L-% IV SOLN: 150.0000 meq | INTRAVENOUS | Status: DC

## 2019-08-23 MED ORDER — LACTATED RINGERS IV BOLUS (SEPSIS)
1000.0000 mL | Freq: Once | INTRAVENOUS | Status: AC
  Administered 2019-08-23: 1000 mL via INTRAVENOUS

## 2019-08-23 MED ORDER — SODIUM CHLORIDE 0.9 % IV SOLN: 250.0000 mL | INTRAVENOUS | Status: DC

## 2019-08-23 MED ORDER — VANCOMYCIN VARIABLE DOSE PER UNSTABLE RENAL FUNCTION (PHARMACIST DOSING): Status: DC

## 2019-08-23 MED ORDER — DOCUSATE SODIUM 50 MG/5ML PO LIQD
100.0000 mg | Freq: Two times a day (BID) | ORAL | Status: DC | PRN
  Administered 2019-08-27 – 2019-08-28 (×3): 100 mg
  Filled 2019-08-23 (×3): qty 10

## 2019-08-23 MED ORDER — LACTATED RINGERS IV BOLUS
1000.0000 mL | Freq: Once | INTRAVENOUS | Status: AC
  Administered 2019-08-23: 18:00:00 1000 mL via INTRAVENOUS

## 2019-08-23 MED ORDER — SODIUM CHLORIDE 0.9 % IV SOLN
INTRAVENOUS | Status: AC | PRN
  Administered 2019-08-23: 1000 mL via INTRAVENOUS

## 2019-08-23 MED ORDER — VASOPRESSIN 20 UNIT/ML IV SOLN
0.0300 [IU]/min | INTRAVENOUS | Status: DC
  Administered 2019-08-23: 0.03 [IU]/min via INTRAVENOUS
  Filled 2019-08-23 (×4): qty 2

## 2019-08-23 MED ORDER — ETOMIDATE 2 MG/ML IV SOLN
INTRAVENOUS | Status: AC | PRN
  Administered 2019-08-23: 20 mg via INTRAVENOUS

## 2019-08-23 MED ORDER — VANCOMYCIN HCL 1500 MG/300ML IV SOLN
1500.0000 mg | Freq: Once | INTRAVENOUS | Status: AC
  Administered 2019-08-23: 1500 mg via INTRAVENOUS
  Filled 2019-08-23: qty 300

## 2019-08-23 MED ORDER — NOREPINEPHRINE 4 MG/250ML-% IV SOLN
2.0000 ug/min | INTRAVENOUS | Status: DC
  Administered 2019-08-23: 25 ug/min via INTRAVENOUS
  Filled 2019-08-23: qty 250

## 2019-08-23 MED ORDER — DEXTROSE 50 % IV SOLN
INTRAVENOUS | Status: AC
  Filled 2019-08-23: qty 50

## 2019-08-23 MED ORDER — SODIUM CHLORIDE 0.9 % IV SOLN
2.0000 g | Freq: Once | INTRAVENOUS | Status: AC
  Administered 2019-08-23: 2 g via INTRAVENOUS
  Filled 2019-08-23: qty 2

## 2019-08-23 MED ORDER — FENTANYL BOLUS VIA INFUSION
25.0000 ug | INTRAVENOUS | Status: DC | PRN
  Filled 2019-08-23: qty 25

## 2019-08-23 MED ORDER — LACTATED RINGERS IV BOLUS
1000.0000 mL | Freq: Once | INTRAVENOUS | Status: AC
  Administered 2019-08-23: 1000 mL via INTRAVENOUS

## 2019-08-23 MED ORDER — BISACODYL 10 MG RE SUPP: 10.0000 mg | Freq: Every day | RECTAL | Status: DC | PRN

## 2019-08-23 MED ORDER — DEXTROSE-NACL 5-0.9 % IV SOLN: INTRAVENOUS | Status: DC

## 2019-08-23 MED ORDER — FENTANYL CITRATE (PF) 100 MCG/2ML IJ SOLN: 50.0000 ug | Freq: Once | INTRAMUSCULAR | Status: DC

## 2019-08-23 MED ORDER — FENTANYL 2500MCG IN NS 250ML (10MCG/ML) PREMIX INFUSION: 40.0000 ug/h | INTRAVENOUS | Status: DC

## 2019-08-23 NOTE — ED Triage Notes (Signed)
Pt's roommate found him unresponsive in his room, last seen at 1000 this morning. Pt had pinpoint pupils, 2 mg narcan given IM by FD, 2 mg narcan given IV by EMS. Pt lost pulses, 2.5 mins of CPR given with pulses returned. Pt blinking but no other neuro activity per EMS.

## 2019-08-23 NOTE — ED Notes (Signed)
Bair hugger placed, MD aware of temp

## 2019-08-23 NOTE — Progress Notes (Signed)
Pharmacy Antibiotic Note  LEANDER TOUT is a 64 y.o. male admitted on 08/23/2019 as post-CPR, WBC 12.4, LA >11, T 87.9.  Pharmacy has been consulted for cefepime and vancomycin dosing.  SCr 3.64 (BL possibly ~1)  Plan: Vancomycin 1500 mg IV x 1, then variable dosing based on renal function Cefepime 2g IV every 24 hours Monitor renal function, Cx and clinical progression to narrow Vancomycin levels as needed  Height: 6' (182.9 cm) IBW/kg (Calculated) : 77.6  Temp (24hrs), Avg:88 F (31.1 C), Min:87.8 F (31 C), Max:88.3 F (31.3 C)  Recent Labs  Lab 08/23/19 1712 08/23/19 1713 08/23/19 1720  WBC  --   --  12.4*  CREATININE 3.40*  --  3.64*  LATICACIDVEN  --  >11.0*  --     CrCl cannot be calculated (Unknown ideal weight.).    Allergies  Allergen Reactions  . Hydrocodone Itching and Nausea And Vomiting  . Percocet [Oxycodone-Acetaminophen] Nausea And Vomiting    Can take oxycodone without tylenol additive  . Tramadol Nausea Only    Antimicrobials this admission: Vanc 2/20>> Cefepime 2/20>>  Dose adjustments this admission:   Microbiology results: 2/20 BCx: sent 2/20 UCx: sent  Daylene Posey, PharmD Clinical Pharmacist Please check AMION for all [facility] numbers 08/23/2019 7:00 PM

## 2019-08-23 NOTE — ED Provider Notes (Signed)
   Procedure Name: Intubation Date/Time: 08/23/2019 5:12 PM Performed by: Mackensie Pilson, [place], MD Pre-anesthesia Checklist: Patient identified, Emergency Drugs available, Suction available, Timeout performed and Patient being monitored Oxygen Delivery Method: Ambu bag Preoxygenation: Pre-oxygenation with 100% oxygen Induction Type: Rapid sequence Ventilation: Two handed mask ventilation required Laryngoscope Size: Glidescope and 4 Grade View: Grade I Tube size: 7.5 mm Number of attempts: 1 Airway Equipment and Method: Rigid stylet and Video-laryngoscopy Placement Confirmation: ETT inserted through vocal cords under direct vision,  Positive ETCO2,  CO2 detector and Breath sounds checked- equal and bilateral Secured at: 27 cm Tube secured with: ETT holder Dental Injury: Teeth and Oropharynx as per pre-operative assessment            Ziggy Chanthavong, [place], MD 08/24/19 0312    Blane Ohara, MD 08/24/19 2344

## 2019-08-23 NOTE — ED Notes (Signed)
Danielle Dess called advised she lives with patient and requesting info--I advised she is not on the account and we would have to s/w an immediate family member who's name is on the account.  I advised she contacts a family member and have them to call--Stehanie Ekstrom

## 2019-08-23 NOTE — ED Provider Notes (Addendum)
ATTENDING SUPERVISORY NOTE I have personally viewed the imaging studies performed, and I was present for key and critical portions of the intubation/ POCUS procedure as documented. I have personally seen and examined the patient, and discussed the plan of care with the resident.  I have reviewed the documentation of the resident and agree.   Hypothermia, initial encounter  Unresponsive  Patient presented after being found unresponsive for unknown amount of time and on route for EMS became pulseless.  Patient did regain pulses after approximately 3 minutes of CPR.  Patient has not had any significant neurologic functioning on exam for EMS or during our exam after arrival.  Patient intubated for airway protection and not breathing well on his own.  .Critical Care Performed by: Elnora Morrison, MD Authorized by: Elnora Morrison, MD   Critical care provider statement:    Critical care time (minutes):  80   Critical care start time:  08/23/2019 6:10 PM   Critical care end time:  08/23/2019 7:30 PM   Critical care time was exclusive of:  Separately billable procedures and treating other patients and teaching time   Critical care was necessary to treat or prevent imminent or life-threatening deterioration of the following conditions:  Cardiac failure   Critical care was time spent personally by me on the following activities:  Discussions with consultants, evaluation of patient's response to treatment, examination of patient, ordering and performing treatments and interventions, ordering and review of laboratory studies, ordering and review of radiographic studies, pulse oximetry, re-evaluation of patient's condition, obtaining history from patient or surrogate and review of old charts    The patients results and plan were reviewed.  Any x-rays performed were independently reviewed by myself.    Medications  sodium chloride 0.9 % bolus 1,000 mL (1,000 mLs Intravenous Not Given 08/23/19 1713)   vancomycin variable dose per unstable renal function (pharmacist dosing) (has no administration in time range)  ceFEPIme (MAXIPIME) 2 g in sodium chloride 0.9 % 100 mL IVPB (has no administration in time range)  chlorhexidine gluconate (MEDLINE KIT) (PERIDEX) 0.12 % solution 15 mL (has no administration in time range)  MEDLINE mouth rinse (has no administration in time range)  fentaNYL (SUBLIMAZE) injection 25-100 mcg (has no administration in time range)  midazolam (VERSED) injection 1 mg (has no administration in time range)  docusate (COLACE) 50 MG/5ML liquid 100 mg (has no administration in time range)  bisacodyl (DULCOLAX) suppository 10 mg (has no administration in time range)  dextrose 5 %-0.9 % sodium chloride infusion (has no administration in time range)  pantoprazole sodium (PROTONIX) 40 mg/20 mL oral suspension 40 mg (has no administration in time range)  heparin injection 5,000 Units (has no administration in time range)  0.9 %  sodium chloride infusion (has no administration in time range)  0.9 %  sodium chloride infusion (has no administration in time range)  sodium bicarbonate 150 mEq in dextrose 5 % 1,000 mL infusion ( Intravenous New Bag/Given 08/23/19 2348)  vasopressin (PITRESSIN) 40 Units in sodium chloride 0.9 % 250 mL (0.16 Units/mL) infusion (0.03 Units/min Intravenous New Bag/Given 08/23/19 2333)  norepinephrine (LEVOPHED) 45m in 2544mpremix infusion (has no administration in time range)  etomidate (AMIDATE) injection (20 mg Intravenous Given 08/23/19 1635)  rocuronium (ZEMURON) injection (80 mg Intravenous Given 08/23/19 1636)  0.9 %  sodium chloride infusion ( Intravenous Stopped 08/23/19 1907)  dextrose 50 % solution (  Given 08/23/19 1650)  norepinephrine (LEVOPHED) 4-5 MG/250ML-% infusion SOLN (6.24 mcg/min  Rate/Dose  Change 08/23/19 2100)  lactated ringers bolus 1,000 mL (0 mLs Intravenous Stopped 08/23/19 1934)  lactated ringers bolus 1,000 mL (0 mLs Intravenous  Stopped 08/23/19 1907)    And  lactated ringers bolus 1,000 mL (0 mLs Intravenous Stopped 08/23/19 1934)    And  lactated ringers bolus 1,000 mL (0 mLs Intravenous Stopped 08/23/19 2047)  ceFEPIme (MAXIPIME) 2 g in sodium chloride 0.9 % 100 mL IVPB (0 g Intravenous Stopped 08/23/19 2047)  metroNIDAZOLE (FLAGYL) IVPB 500 mg (0 mg Intravenous Stopped 08/23/19 2047)  vancomycin (VANCOREADY) IVPB 1500 mg/300 mL (1,500 mg Intravenous New Bag/Given 08/23/19 1950)  lactated ringers bolus 1,000 mL (1,000 mLs Intravenous New Bag/Given 08/23/19 2318)    Vitals:   08/23/19 2100 08/23/19 2127 08/23/19 2218 08/23/19 2255  BP: (!) 109/91  (!) 115/94   Pulse: 84     Resp: 16     Temp: (!) 91.8 F (33.2 C)  (!) 93.9 F (34.4 C)   TempSrc:      SpO2: 100% 100%  100%  Weight:   81.2 kg   Height:        Final diagnoses:  Hypothermia, initial encounter  Unresponsive  Cardiac arrest (HCC)  Acute renal failure, unspecified acute renal failure type ([place])  Other specified hypotension  Lactic acidosis    Admission/ observation were discussed with the admitting physician, patient and/or family and they are comfortable with the plan.     Elnora Morrison, MD 08/23/19 [phone]

## 2019-08-23 NOTE — Progress Notes (Signed)
Sepsis bundle complete. Although patient is still very sick, will close sepsis bundle and continue to follow as ICU patient.

## 2019-08-23 NOTE — Progress Notes (Signed)
Notified bedside nurse of need to draw repeat lactic acid @ 1913. 

## 2019-08-23 NOTE — ED Provider Notes (Signed)
[facility]PART[facility] Provider Note   CSN: [id number] Arrival date & time: 08/23/19  1634  History No chief complaint on file.  [facility] Davis is a 64 y.o. male.  Per EMS report: 65 year old male who presents with unresponsiveness from unknown downtime.  Roommate last saw him at normal baseline at 10 AM endorses an active history of drug use including cocaine and methamphetamines.  Patient was brought in by EMS and was initially unresponsive and pulseless.  Patient received 1 round of CPR without epinephrine and had return of pulse.  Cardiac monitor showed A. fib.  Patient had pinpoint pupils bilaterally and reactive to light and received 1 dose IM and 1 dose of IV naloxone with no response.  He had no episodes of responsiveness while being transported.  Upon arrival to the ED, patient was unresponsive with pinpoint pupils, hypotension, not protecting airway, cold extremities. H/o central cord syndrome 2015 with residual arm weakness.  Salvadore Oxford called [phone]):  She was the one who found the patient unresponsive.  She states he was last seen at his baseline at 11am and he seemed okay.  When she saw him again at 3 PM he seemed short of breath and then stopped breathing which prompted her to call EMS. She states that he was smoking cocaine and marijuana early this morning with alcohol use but denies any IV drug use.  States he has never overdosed before. Only medications are gabapentin and multivitamin. At baseline, patient is able to walk, talk, perform ADLs with some difficulty due to bilateral arm weakness and paresthesias from past spinal surgery. She denied any recent illness or known cardiac history for the patient.    Past Medical History:  Diagnosis Date  . Alcohol abuse   . Arthritis    "both knees; joints in hands; back of my neck; lower back" (11/04/2015)  . Cataract   . Chronic back pain    "upper and lower" (11/04/2015)  . Depression    . Fractures 04/2015   neck fracture  . GERD (gastroesophageal reflux disease)    Patient Active Problem List   Diagnosis Date Noted  . Acute respiratory failure ([place]) 08/23/2019  . Acute renal failure ([place])   . Cardiac arrest ([place])   . Lactic acidosis   . Shock ([place])   . Osteoarthritis of right knee 11/04/2015  . Total knee replacement status 11/04/2015  . Cervical spine fracture, initial encounter 04/15/2015  . Fall 11/02/2014  . Rib fractures 10/31/2014  . Esophageal reflux 03/02/2014  . Anemia, unspecified 03/02/2014  . Insomnia 03/02/2014  . IFG (impaired fasting glucose) 03/02/2014  . Central cord syndrome ([place]) 02/11/2014  . Myelopathy, spondylogenic, cervical 02/09/2014  . Bilateral arm weakness 02/04/2014  . Cord compression syndrome ([place]) 02/04/2014  . Tobacco use disorder 02/04/2014  . Arthritis 02/04/2014   Past Surgical History:  Procedure Laterality Date  . ARTHROSCOPIC REPAIR ACL Right   . BACK SURGERY    . FINGER FRACTURE SURGERY Right    4th Finger  . FRACTURE SURGERY    . JOINT REPLACEMENT    . MANDIBLE FRACTURE SURGERY  1990s  . ORBITAL FRACTURE SURGERY Right ~ 1970  . ORIF ANKLE FRACTURE Right   . POSTERIOR CERVICAL FUSION/FORAMINOTOMY N/A 02/09/2014   Procedure: POSTERIOR CERVICAL FUSION/FORAMINOTOMY LEVEL 4  Cervical three to seven decompression and fusion with lateral mass screws;  Surgeon: Elaina Hoops, MD;  Location: [facility];  Service: Neurosurgery;  Laterality: N/A;  . POSTERIOR  CERVICAL FUSION/FORAMINOTOMY N/A 04/15/2015   Procedure: Posterior cervical fusion C6-T1 with instrumentation and arthrodesis, decompressive laminectomy C7-T1, Foraminotomy C8-T1;  Surgeon: Kary Kos, MD;  Location: [facility];  Service: Neurosurgery;  Laterality: N/A;  . TONSILLECTOMY    . TOTAL KNEE ARTHROPLASTY Right 11/04/2015  . TOTAL KNEE ARTHROPLASTY Right 11/04/2015   Procedure: RIGHT TOTAL KNEE ARTHROPLASTY;  Surgeon: Leandrew Koyanagi, MD;  Location: [facility];   Service: Orthopedics;  Laterality: Right;    Family History  Problem Relation Age of Onset  . Cancer Mother        breast cancer   Social History   Tobacco Use  . Smoking status: Former Smoker    Packs/day: 0.12    Years: 30.00    Pack years: 3.60    Types: Cigarettes    Quit date: 01/17/2016    Years since quitting: 3.6  . Smokeless tobacco: Never Used  . Tobacco comment: states he quit 3-4 months ago  Substance Use Topics  . Alcohol use: Yes    Alcohol/week: 6.0 standard drinks    Types: 6 Cans of beer per week  . Drug use: No   Home Medications Prior to Admission medications   Medication Sig Start Date End Date Taking? Authorizing Provider  baclofen (LIORESAL) 10 MG tablet Take 10 mg by mouth 3 (three) times daily. 10/20/15   [provider]  Oxycodone HCl 20 MG TABS Take 1 tablet by mouth every 6 (six) hours as needed (pain).    [provider]   Allergies    Hydrocodone, Percocet [oxycodone-acetaminophen], and Tramadol  Review of Systems   Review of Systems  Unable to perform ROS: Patient unresponsive   Physical Exam Updated Vital Signs BP (!) 109/91   Pulse 84   Temp (!) 91.8 F (33.2 C)   Resp 16   Ht 6' (1.829 m)   SpO2 100%   BMI 25.09 kg/m   Physical Exam Vitals and nursing note reviewed.  HENT:     Head: Atraumatic.  Eyes:     Comments: Miosis, non-reactive bilaterally  Cardiovascular:     Rate and Rhythm: Normal rate.  Pulmonary:     Comments: bradypnea Abdominal:     General: Abdomen is flat.     Palpations: Abdomen is soft.  Skin:    General: Skin is warm.     Capillary Refill: Capillary refill takes 2 to 3 seconds.     Comments: Multiple small abrasions and bruises on hands and forearms  Neurological:     Comments: Unresponsive even to painful stimuli    ED Results / Procedures / Treatments   Labs (all labs ordered are listed, but only abnormal results are displayed) Labs Reviewed  ACETAMINOPHEN LEVEL -  Abnormal; Notable for the following components:      Result Value   Acetaminophen (Tylenol), Serum <10 (*)    All other components within normal limits  COMPREHENSIVE METABOLIC PANEL - Abnormal; Notable for the following components:   CO2 13 (*)    Creatinine, Ser 3.64 (*)    AST 178 (*)    ALT 69 (*)    GFR calc non Af Amer 16 (*)    GFR calc Af Amer 19 (*)    Anion gap 26 (*)    All other components within normal limits  SALICYLATE LEVEL - Abnormal; Notable for the following components:   Salicylate Lvl <1.6 (*)    All other components within normal limits  LACTIC ACID, PLASMA -  Abnormal; Notable for the following components:   Lactic Acid, Venous >11.0 (*)    All other components within normal limits  LACTIC ACID, PLASMA - Abnormal; Notable for the following components:   Lactic Acid, Venous >11.0 (*)    All other components within normal limits  CBC WITH DIFFERENTIAL/PLATELET - Abnormal; Notable for the following components:   WBC 12.4 (*)    MCV 112.5 (*)    MCHC 29.9 (*)    Neutro Abs 8.7 (*)    Monocytes Absolute 2.0 (*)    nRBC 2 (*)    All other components within normal limits  PROTIME-INR - Abnormal; Notable for the following components:   Prothrombin Time 16.6 (*)    INR 1.4 (*)    All other components within normal limits  RAPID URINE DRUG SCREEN, HOSP PERFORMED - Abnormal; Notable for the following components:   Cocaine POSITIVE (*)    All other components within normal limits  CK - Abnormal; Notable for the following components:   Total CK 1,220 (*)    All other components within normal limits  CBG MONITORING, ED - Abnormal; Notable for the following components:   Glucose-Capillary 69 (*)    All other components within normal limits  I-STAT CHEM 8, ED - Abnormal; Notable for the following components:   Creatinine, Ser 3.40 (*)    Glucose, Bld 69 (*)    Calcium, Ion 1.08 (*)    TCO2 18 (*)    All other components within normal limits  CBG MONITORING, ED -  Abnormal; Notable for the following components:   Glucose-Capillary 205 (*)    All other components within normal limits  POCT I-STAT EG7 - Abnormal; Notable for the following components:   pH, Ven 6.819 (*)    pCO2, Ven 92.8 (*)    pO2, Ven 198.0 (*)    Bicarbonate 15.1 (*)    TCO2 18 (*)    Acid-base deficit 21.0 (*)    Calcium, Ion 1.11 (*)    All other components within normal limits  POCT I-STAT 7, (LYTES, BLD GAS, ICA,H+H) - Abnormal; Notable for the following components:   pH, Arterial 7.070 (*)    pO2, Arterial 576.0 (*)    Bicarbonate 13.7 (*)    TCO2 15 (*)    Acid-base deficit 16.0 (*)    Calcium, Ion 1.08 (*)    HCT 38.0 (*)    Hemoglobin 12.9 (*)    All other components within normal limits  POCT I-STAT 7, (LYTES, BLD GAS, ICA,H+H) - Abnormal; Notable for the following components:   pH, Arterial 7.306 (*)    pCO2 arterial 21.4 (*)    pO2, Arterial 209.0 (*)    Bicarbonate 11.6 (*)    TCO2 12 (*)    Acid-base deficit 15.0 (*)    Potassium 6.5 (*)    Calcium, Ion 1.04 (*)    All other components within normal limits  TROPONIN I (HIGH SENSITIVITY) - Abnormal; Notable for the following components:   Troponin I (High Sensitivity) 171 (*)    All other components within normal limits  TROPONIN I (HIGH SENSITIVITY) - Abnormal; Notable for the following components:   Troponin I (High Sensitivity) 210 (*)    All other components within normal limits  RESPIRATORY PANEL BY RT PCR (FLU A&B, COVID)  CULTURE, BLOOD (ROUTINE X 2)  CULTURE, BLOOD (ROUTINE X 2)  URINE CULTURE  CULTURE, RESPIRATORY  ETHANOL  URINALYSIS, ROUTINE W REFLEX MICROSCOPIC  APTT  TSH  CORTISOL  BLOOD GAS, ARTERIAL  BLOOD GAS, ARTERIAL  BASIC METABOLIC PANEL  LACTIC ACID, PLASMA  COMPREHENSIVE METABOLIC PANEL  CBC  MAGNESIUM  PHOSPHORUS  CK  OSMOLALITY  LACTIC ACID, PLASMA  SODIUM, URINE, RANDOM  CREATININE, URINE, RANDOM  TYPE AND SCREEN    EKG EKG  Interpretation  Date/Time:  Saturday August 23 2019 17:01:37 EST Ventricular Rate:  79 PR Interval:    QRS Duration: 158 QT Interval:  437 QTC Calculation: 501 R Axis:   13 Text Interpretation: Atrial fibrillation Left bundle branch block Confirmed by Elnora Morrison [phone]) on 08/23/2019 5:11:03 PM   Radiology DG Chest Port 1 View  Result Date: 08/23/2019 CLINICAL DATA:  Altered mental status EXAM: PORTABLE CHEST 1 VIEW COMPARISON:  04/14/2015 FINDINGS: Endotracheal tube tip is 4 cm above the inferior margin of the carina. Lungs are clear. Normal cardiomediastinal contours. Orogastric tube tip below the field of view. The IMPRESSION: Endotracheal tube tip 4 cm above the carina. Electronically Signed   By: Ulyses Jarred M.D.   On: 08/23/2019 18:00    Procedures Ultrasound ED Echo  Date/Time: 08/23/2019 6:11 PM Performed by: Richarda Osmond, DO Authorized by: Elnora Morrison, MD   Procedure details:    Indications: cardiac arrest     Views: subxiphoid, parasternal long axis view, parasternal short axis view, apical 4 chamber view and IVC view     Images: archived   Findings:    Pericardium: no pericardial effusion     Cardiac Activity: normal cardiac activity     LV Function: depressed (30 - 50%)     RV Diameter: dilated     IVC: normal     IVC comment:  Intubated Impression:    Impression: high-output state, abnormal cardiac activity and decreased contractility     (including critical care time)  Medications Ordered in ED Medications  sodium chloride 0.9 % bolus 1,000 mL (1,000 mLs Intravenous Not Given 08/23/19 1713)  vancomycin (VANCOREADY) IVPB 1500 mg/300 mL (1,500 mg Intravenous New Bag/Given 08/23/19 1950)  vancomycin variable dose per unstable renal function (pharmacist dosing) (has no administration in time range)  ceFEPIme (MAXIPIME) 2 g in sodium chloride 0.9 % 100 mL IVPB (has no administration in time range)  chlorhexidine gluconate (MEDLINE KIT) (PERIDEX)  0.12 % solution 15 mL (has no administration in time range)  MEDLINE mouth rinse (has no administration in time range)  fentaNYL (SUBLIMAZE) injection 25-100 mcg (has no administration in time range)  midazolam (VERSED) injection 1 mg (has no administration in time range)  docusate (COLACE) 50 MG/5ML liquid 100 mg (has no administration in time range)  bisacodyl (DULCOLAX) suppository 10 mg (has no administration in time range)  dextrose 5 %-0.9 % sodium chloride infusion (has no administration in time range)  pantoprazole sodium (PROTONIX) 40 mg/20 mL oral suspension 40 mg (has no administration in time range)  heparin injection 5,000 Units (has no administration in time range)  0.9 %  sodium chloride infusion (has no administration in time range)  0.9 %  sodium chloride infusion (has no administration in time range)  norepinephrine (LEVOPHED) 60m in 2551mpremix infusion (has no administration in time range)  etomidate (AMIDATE) injection (20 mg Intravenous Given 08/23/19 1635)  rocuronium (ZEMURON) injection (80 mg Intravenous Given 08/23/19 1636)  0.9 %  sodium chloride infusion ( Intravenous Stopped 08/23/19 1907)  dextrose 50 % solution (  Given 08/23/19 1650)  norepinephrine (LEVOPHED) 4-5 MG/250ML-% infusion SOLN (35 mcg/min  Rate/Dose Change 08/23/19 1818)  lactated ringers bolus 1,000 mL (0 mLs Intravenous Stopped 08/23/19 1934)  lactated ringers bolus 1,000 mL (0 mLs Intravenous Stopped 08/23/19 1907)    And  lactated ringers bolus 1,000 mL (0 mLs Intravenous Stopped 08/23/19 1934)    And  lactated ringers bolus 1,000 mL (0 mLs Intravenous Stopped 08/23/19 2047)  ceFEPIme (MAXIPIME) 2 g in sodium chloride 0.9 % 100 mL IVPB (0 g Intravenous Stopped 08/23/19 2047)  metroNIDAZOLE (FLAGYL) IVPB 500 mg (0 mg Intravenous Stopped 08/23/19 2047)    ED Course  I have reviewed the triage vital signs and the nursing notes.  Pertinent labs & imaging results that were available during my care of the  patient were reviewed by me and considered in my medical decision making (see chart for details).    MDM Rules/Calculators/A&P                     Unresponsive patient in afib and being ventilated with BVM on arrival s/p one round of CPR and 2 doses naloxone without change.  In the ED-Intubated and given hemodynamic support with IV NS and LR boluses x3, and norepinephrine which has been continuously titrated to maintain hemodynamic stability. Initial CBG 69- given amp d50 with no improvement. Temp foley placed and showed 88- placed on bear hugger. Bedside echo performed showing right ventricular enlargement and decreased contractility. IVC was identified and dilated with intubated patient. Initiated sepsis workup including blood cultures which are pending. Other labs significant for elevated LA and troponin likely 2/2 demand ischemia. Ethanol, acetaminophen and salicylate negative. Positive cocaine.  Obtained head CT and attempted CTA chest but was unable to obtain due to poor kidney function (creatinine 3.64) as contraindication to contrast.  Would consider anticoagulation under suspicion for PE if head CT negative for bleed but holding off for further evaluation from CCM under their request. Patient did not have improvement of clinical state upon several reassessments throughout ED course. Not responsive to painful stimuli.  Patient discussed with CCM who will be admitting for further management.  Final Clinical Impression(s) / ED Diagnoses Final diagnoses:  Hypothermia, initial encounter  Unresponsive  Cardiac arrest [facility])  Acute renal failure, unspecified acute renal failure type ([place])  Other specified hypotension  Lactic acidosis    Rx / DC Orders ED Discharge Orders    None       Richarda Osmond, DO 08/23/19 2128    Elnora Morrison, MD 08/23/19 2350

## 2019-08-23 NOTE — H&P (Addendum)
NAME:  Gary Davis, MRN:  [id number], DOB:  [birth year], LOS: 0 ADMISSION DATE:  08/23/2019, CONSULTATION DATE:  08/23/19 REFERRING MD: Reather Converse, CHIEF COMPLAINT:  Cardiac arrest   Brief History   64 y.o. M who was found unresponsive by a roommate with unknown downtime.  Last known well was 5 hours before.  EMS found patient unresponsive and pulseless, ROSC after 1 round of CPR.  Intubated and unresponsive in the ED and PCCM consulted for admission.  History of present illness   Gary Davis is a 64 year old male with past medical history of cocaine and alcohol abuse, depression and orthopedic problems who was found unresponsive by his roommate/fiance around 3 PM 08/23/2019.  Per reports, he was last seen well around 10 or 11 AM and was drinking alcohol and smoking cocaine.  He stopped breathing after fiance found him and she called EMS.  Narcan was administered without improvement, pulses were then lost and CPR was performed and he responded to 1 round without epi.  He was intubated and transported to the emergency department.  In the ED, he has not had any significant neurologic activity hours after after RSI medications.  Did not show signs of STEMI or significant ischemia,  labs were significant for lactic acid >11, respiratory acidosis, renal insufficiency, mild troponin anemia and negative flu and COVID-19.  He has required Levophed for hypotension and Bair hugger for hypothermia.  CT head, chest, abdomen pelvis pending and PCCM consulted for admission.  Past Medical History   has a past medical history of Alcohol abuse, Arthritis, Cataract, Chronic back pain, Depression, Fractures (04/2015), and GERD (gastroesophageal reflux disease).   Significant Hospital Events   2/20 Admit to PCCM  Consults:    Procedures:  2/20 ETT  Significant Diagnostic Tests:  2/20 CXR>> no significant infiltrates 2/20 CT head/chest/abdomen and pelvis>> pending  Micro Data:  2/20 SARS-Covid-2 and  influenza>> negative 2/20 blood cultures x2>>  Antimicrobials:  Cefepime 2/20 Vancomycin 2/20 Flagyl 2/20  Interim history/subjective:  Patient has remained unresponsive in the emergency department  Objective   Blood pressure 112/77, pulse 81, temperature (!) 88.7 F (31.5 C), resp. rate 16, height 6' (1.829 m), SpO2 100 %.    Vent Mode: PRVC FiO2 (%):  [80 %-100 %] 80 % Set Rate:  [16 bmp] 16 bmp Vt Set:  [620 mL] 620 mL PEEP:  [5 cmH20] 5 cmH20 Plateau Pressure:  [13 cmH20-18 cmH20] 18 cmH20   Intake/Output Summary (Last 24 hours) at 08/23/2019 2051 Last data filed at 08/23/2019 2047 Gross per 24 hour  Intake 8070.76 ml  Output 675 ml  Net 7395.76 ml   There were no vitals filed for this visit.  General: Unresponsive, well-nourished African-American male HEENT: MM pink/moist, pupils pinpoint and unresponsive Neuro: Intubated, not on any sedation and unresponsive to pain, no gag or corneal reflex, not breathing over the ventilator CV: s1s2 RRR, no m/r/g PULM: Intubated, no significant rhonchi or wheezing GI: soft, bsx4 active  Extremities: warm/dry, no edema  Skin: no rashes or lesions  Resolved Hospital Problem list     Assessment & Plan:   Cardiac arrest with unclear initial rhythm, subsequent hypercapnic respiratory failure and likely cardiogenic shock -In the setting of alcohol and cocaine use, no history of IV drug abuse or overdose per patient's brother -Poor initial neurologic exam P: -CT head, chest, abdomen pelvis pending, continue full ventilator support and pressors to maintain MAP >65, IV fluids may need central line -Obtain echocardiogram and EEG -Not  a candidate for cooling given unclear downtime and initial hypothermia 80 7.10F -Has leukocytosis along with lactic acidosis and hypothermia, there is no clear source for concurrent sepsis however continue empiric antibiotics and follow blood cultures --Maintain full vent support with SAT/SBT as  tolerated -titrate Vent setting to maintain SpO2 greater than or equal to 90%. -HOB elevated 30 degrees. -Plateau pressures less than 30 cm H20.  -Follow chest x-ray, ABG prn.   -Bronchial hygiene and RT/bronchodilator protocol. -Very guarded prognosis, have discussed this inpatient status with his brother, patient has no children or spouse, brother wishes to continue full code for now   Encephalopathy -Has been unresponsive since arrival P: -At risk for anoxic brain injury given unknown downtime -Head CT pending -Obtain EEG, consider neurology consult   Nonoliguric renal failure, unclear chronicity -Last baseline creatinine was normal 3 years ago P: -IV fluids, monitor urine output -Monitor electrolytes and repeat anabolic panel   History of arthritis cervical fusion on chronic opioid therapy -Hold home baclofen and opioids   Best practice:  Diet: N.p.o. Pain/Anxiety/Delirium protocol (if indicated): Fentanyl VAP protocol (if indicated): Yes, HOB at 30 degrees DVT prophylaxis: Heparin GI prophylaxis: Protonix Glucose control: SSI Mobility: Bedrest Code Status: Full code Family Communication: Patient status discussed with his brother [name] Disposition: ICU  Labs   CBC: Recent Labs  Lab 08/23/19 1710 08/23/19 1712 08/23/19 1720 08/23/19 1800  WBC  --   --  12.4*  --   NEUTROABS  --   --  8.7*  --   HGB 16.0 16.3 15.1 12.9*  HCT 47.0 48.0 50.5 38.0*  MCV  --   --  112.5*  --   PLT  --   --  214  --     Basic Metabolic Panel: Recent Labs  Lab 08/23/19 1710 08/23/19 1712 08/23/19 1720 08/23/19 1800  NA 142 142 144 140  K 4.2 4.3 4.4 4.1  CL  --  108 105  --   CO2  --   --  13*  --   GLUCOSE  --  69* 77  --   BUN  --  21 16  --   CREATININE  --  3.40* 3.64*  --   CALCIUM  --   --  9.2  --    GFR: CrCl cannot be calculated (Unknown ideal weight.). Recent Labs  Lab 08/23/19 1713 08/23/19 1720 08/23/19 1905  WBC  --  12.4*  --   LATICACIDVEN  >11.0*  --  >11.0*    Liver Function Tests: Recent Labs  Lab 08/23/19 1720  AST 178*  ALT 69*  ALKPHOS 113  BILITOT 0.9  PROT 7.3  ALBUMIN 3.8   No results for input(s): LIPASE, AMYLASE in the last 168 hours. No results for input(s): AMMONIA in the last 168 hours.  ABG    Component Value Date/Time   PHART 7.070 (LL) 08/23/2019 1800   PCO2ART 47.4 08/23/2019 1800   PO2ART 576.0 (H) 08/23/2019 1800   HCO3 13.7 (L) 08/23/2019 1800   TCO2 15 (L) 08/23/2019 1800   ACIDBASEDEF 16.0 (H) 08/23/2019 1800   O2SAT 100.0 08/23/2019 1800     Coagulation Profile: Recent Labs  Lab 08/23/19 1720  INR 1.4*    Cardiac Enzymes: Recent Labs  Lab 08/23/19 1720  CKTOTAL 1,220*    HbA1C: Hemoglobin A1C  Date/Time Value Ref Range Status  07/27/2014 11:53 AM 5.60  Final    CBG: Recent Labs  Lab 08/23/19 1644 08/23/19 1712  GLUCAP  69* 205*    Review of Systems:   Unable to obtain secondary to neurologic status  Past Medical History  He,  has a past medical history of Alcohol abuse, Arthritis, Cataract, Chronic back pain, Depression, Fractures (04/2015), and GERD (gastroesophageal reflux disease).   Surgical History    Past Surgical History:  Procedure Laterality Date  . ARTHROSCOPIC REPAIR ACL Right   . BACK SURGERY    . FINGER FRACTURE SURGERY Right    4th Finger  . FRACTURE SURGERY    . JOINT REPLACEMENT    . MANDIBLE FRACTURE SURGERY  1990s  . ORBITAL FRACTURE SURGERY Right ~ 1970  . ORIF ANKLE FRACTURE Right   . POSTERIOR CERVICAL FUSION/FORAMINOTOMY N/A 02/09/2014   Procedure: POSTERIOR CERVICAL FUSION/FORAMINOTOMY LEVEL 4  Cervical three to seven decompression and fusion with lateral mass screws;  Surgeon: Mariam Dollar, MD;  Location: [facility];  Service: Neurosurgery;  Laterality: N/A;  . POSTERIOR CERVICAL FUSION/FORAMINOTOMY N/A 04/15/2015   Procedure: Posterior cervical fusion C6-T1 with instrumentation and arthrodesis, decompressive laminectomy C7-T1,  Foraminotomy C8-T1;  Surgeon: Donalee Citrin, MD;  Location: [facility];  Service: Neurosurgery;  Laterality: N/A;  . TONSILLECTOMY    . TOTAL KNEE ARTHROPLASTY Right 11/04/2015  . TOTAL KNEE ARTHROPLASTY Right 11/04/2015   Procedure: RIGHT TOTAL KNEE ARTHROPLASTY;  Surgeon: Tarry Kos, MD;  Location: [facility];  Service: Orthopedics;  Laterality: Right;     Social History   reports that he quit smoking about 3 years ago. His smoking use included cigarettes. He has a 3.60 pack-year smoking history. He has never used smokeless tobacco. He reports current alcohol use of about 6.0 standard drinks of alcohol per week. He reports that he does not use drugs.   Family History   His family history includes Cancer in his mother.   Allergies Allergies  Allergen Reactions  . Hydrocodone Itching and Nausea And Vomiting  . Percocet [Oxycodone-Acetaminophen] Nausea And Vomiting    Can take oxycodone without tylenol additive  . Tramadol Nausea Only     Home Medications  Prior to Admission medications   Medication Sig Start Date End Date Taking? Authorizing Provider  aspirin EC 325 MG tablet Take 1 tablet (325 mg total) by mouth 2 (two) times daily. Patient not taking: Reported on 05/19/2016 11/04/15   Tarry Kos, MD  baclofen (LIORESAL) 10 MG tablet Take 10 mg by mouth 3 (three) times daily. 10/20/15   [provider]  gabapentin (NEURONTIN) 300 MG capsule TAKE 1 CAPSULE (300 MG TOTAL) BY MOUTH 3 (THREE) TIMES DAILY. Patient not taking: Reported on 05/19/2016 06/29/15   Ambrose Finland, NP  meloxicam (MOBIC) 15 MG tablet Take 1 tablet (15 mg total) by mouth daily. Patient not taking: Reported on 05/19/2016 09/24/15   Ambrose Finland, NP  methocarbamol (ROBAXIN) 750 MG tablet Take 1 tablet (750 mg total) by mouth 2 (two) times daily as needed for muscle spasms. Patient not taking: Reported on 05/19/2016 11/04/15   Tarry Kos, MD  ondansetron (ZOFRAN) 4 MG tablet Take 1-2 tablets (4-8 mg total) by  mouth every 8 (eight) hours as needed for nausea or vomiting. Patient not taking: Reported on 05/19/2016 11/04/15   Tarry Kos, MD  oxyCODONE (OXY IR/ROXICODONE) 5 MG immediate release tablet Take 1-3 tablets (5-15 mg total) by mouth every 4 (four) hours as needed. Patient not taking: Reported on 05/19/2016 11/04/15   Tarry Kos, MD  oxyCODONE (OXYCONTIN) 10 mg 12 hr  tablet Take 1 tablet (10 mg total) by mouth every 12 (twelve) hours. Patient not taking: Reported on 05/19/2016 11/04/15   Tarry Kos, MD  Oxycodone HCl 20 MG TABS Take 1 tablet by mouth every 6 (six) hours as needed (pain).    [provider]  pantoprazole (PROTONIX) 40 MG tablet Take 1 tablet (40 mg total) by mouth daily. Patient not taking: Reported on 05/19/2016 06/29/15   Ambrose Finland, NP  senna-docusate (SENOKOT S) 8.6-50 MG tablet Take 1 tablet by mouth at bedtime as needed. Patient not taking: Reported on 05/19/2016 11/04/15   Tarry Kos, MD  Vitamin D, Ergocalciferol, (DRISDOL) 50000 units CAPS capsule Take 1 capsule (50,000 Units total) by mouth every 7 (seven) days. Patient not taking: Reported on 05/19/2016 09/28/15   Ambrose Finland, NP     Critical care time: 55 minutes    CRITICAL CARE Performed by: [name] Raydin Bielinski   Total critical care time: 55 minutes  Critical care time was exclusive of separately billable procedures and treating other patients.  Critical care was necessary to treat or prevent imminent or life-threatening deterioration.  Critical care was time spent personally by me on the following activities: development of treatment plan with patient and/or surrogate as well as nursing, discussions with consultants, evaluation of patient's response to treatment, examination of patient, obtaining history from patient or surrogate, ordering and performing treatments and interventions, ordering and review of laboratory studies, ordering and review of radiographic studies, pulse oximetry and  re-evaluation of patient's condition.  [name] Tyrez Berrios, PA-C

## 2019-08-23 NOTE — Progress Notes (Signed)
eLink Physician-Brief Progress Note Patient Name: Gary Davis DOB: 07/20/1955 MRN: [id number]   Date of Service  08/23/2019  HPI/Events of Note  56M who is admitted s/p cardiac arrest. Normal NCHCT. Severe metabolic acidosis secondary to very high lactic acid level. Making urine. No overt source of infection at this time but covering broadly with ABX regardless given multisystem organ failure.  Patient currently not on sedation and not following commands, but does try to spontaneously open eyes. He is mechanically ventilated.   eICU Interventions  # Neuro: - NCHCT was normal. - Defer TTM given already hypothermic. - Neuro c/s in AM with EEG.  # Cardiac: - Levophed + vaso drips to maintain MAP >\= . - 1L LR bolus now. - RT unable to get ABGs or radial A-line so will assess for femoral A-line.  # Respiratory: - Full vent support. - Hyperventilate to compensate for metabolic acidosis.  # Renal / Acid-Base: - F/u repeat BMP. - Isotonic bicarbonate @ 200cc/hr for now given his significant metabolic acidosis. - Repeat ABG in 4 hours (~0300).  # Endocrine: - D5 fluid for now given hypoglycemia on arrival. - Q4H POCT glucose checks  # ID: - Continue broad spectrum ABX (vanc/cefepime).  # GI: - NPO.  GI PPX: Protonix DVT PPX: Heparin Code Status: Full     Intervention Category Evaluation Type: New Patient Evaluation  Janae Bridgeman 08/23/2019, 10:54 PM

## 2019-08-23 NOTE — ED Notes (Signed)
Danielle Dess fiance [phone] looking for an update

## 2019-08-23 NOTE — Progress Notes (Signed)
Unable to successfully place arterial line. Pressure held over site and no hematoma.

## 2019-08-24 ENCOUNTER — Inpatient Hospital Stay (HOSPITAL_COMMUNITY): Payer: Medicare Other

## 2019-08-24 DIAGNOSIS — I469 Cardiac arrest, cause unspecified: Secondary | ICD-10-CM

## 2019-08-24 DIAGNOSIS — J9600 Acute respiratory failure, unspecified whether with hypoxia or hypercapnia: Secondary | ICD-10-CM

## 2019-08-24 DIAGNOSIS — N179 Acute kidney failure, unspecified: Secondary | ICD-10-CM

## 2019-08-24 DIAGNOSIS — G9340 Encephalopathy, unspecified: Secondary | ICD-10-CM

## 2019-08-24 DIAGNOSIS — R579 Shock, unspecified: Secondary | ICD-10-CM

## 2019-08-24 DIAGNOSIS — I361 Nonrheumatic tricuspid (valve) insufficiency: Secondary | ICD-10-CM

## 2019-08-24 DIAGNOSIS — I340 Nonrheumatic mitral (valve) insufficiency: Secondary | ICD-10-CM

## 2019-08-24 LAB — CBC
HCT: 40.4 % (ref 39.0–52.0)
Hemoglobin: 13.8 g/dL (ref 13.0–17.0)
MCH: 34.5 pg — ABNORMAL HIGH (ref 26.0–34.0)
MCHC: 34.2 g/dL (ref 30.0–36.0)
MCV: 101 fL — ABNORMAL HIGH (ref 80.0–100.0)
Platelets: 170 10*3/uL (ref 150–400)
RBC: 4 MIL/uL — ABNORMAL LOW (ref 4.22–5.81)
RDW: 13.2 % (ref 11.5–15.5)
WBC: 9.4 10*3/uL (ref 4.0–10.5)
nRBC: 0 % (ref 0.0–0.2)

## 2019-08-24 LAB — POCT I-STAT 7, (LYTES, BLD GAS, ICA,H+H)
Acid-Base Excess: 1 mmol/L (ref 0.0–2.0)
Acid-base deficit: 1 mmol/L (ref 0.0–2.0)
Bicarbonate: 24 mmol/L (ref 20.0–28.0)
Bicarbonate: 24.6 mmol/L (ref 20.0–28.0)
Calcium, Ion: 1.09 mmol/L — ABNORMAL LOW (ref 1.15–1.40)
Calcium, Ion: 1.09 mmol/L — ABNORMAL LOW (ref 1.15–1.40)
HCT: 39 % (ref 39.0–52.0)
HCT: 36 % — ABNORMAL LOW (ref 39.0–52.0)
Hemoglobin: 13.3 g/dL (ref 13.0–17.0)
Hemoglobin: 12.2 g/dL — ABNORMAL LOW (ref 13.0–17.0)
O2 Saturation: 100 %
O2 Saturation: 99 %
Patient temperature: 38.6
Patient temperature: 97
Potassium: 4.8 mmol/L (ref 3.5–5.1)
Potassium: 4.1 mmol/L (ref 3.5–5.1)
Sodium: 137 mmol/L (ref 135–145)
Sodium: 138 mmol/L (ref 135–145)
TCO2: 25 mmol/L (ref 22–32)
TCO2: 26 mmol/L (ref 22–32)
pCO2 arterial: 45.2 mmHg (ref 32.0–48.0)
pCO2 arterial: 32.8 mmHg (ref 32.0–48.0)
pH, Arterial: 7.34 — ABNORMAL LOW (ref 7.350–7.450)
pH, Arterial: 7.479 — ABNORMAL HIGH (ref 7.350–7.450)
pO2, Arterial: 225 mmHg — ABNORMAL HIGH (ref 83.0–108.0)
pO2, Arterial: 107 mmHg (ref 83.0–108.0)

## 2019-08-24 LAB — COMPREHENSIVE METABOLIC PANEL
ALT: 81 U/L — ABNORMAL HIGH (ref 0–44)
AST: 204 U/L — ABNORMAL HIGH (ref 15–41)
Albumin: 3 g/dL — ABNORMAL LOW (ref 3.5–5.0)
Alkaline Phosphatase: 75 U/L (ref 38–126)
Anion gap: 11 (ref 5–15)
BUN: 21 mg/dL (ref 8–23)
CO2: 24 mmol/L (ref 22–32)
Calcium: 8.3 mg/dL — ABNORMAL LOW (ref 8.9–10.3)
Chloride: 103 mmol/L (ref 98–111)
Creatinine, Ser: 2.63 mg/dL — ABNORMAL HIGH (ref 0.61–1.24)
GFR calc Af Amer: 28 mL/min — ABNORMAL LOW (ref >60)
GFR calc non Af Amer: 24 mL/min — ABNORMAL LOW (ref >60)
Glucose, Bld: 162 mg/dL — ABNORMAL HIGH (ref 70–99)
Potassium: 4.8 mmol/L (ref 3.5–5.1)
Sodium: 138 mmol/L (ref 135–145)
Total Bilirubin: 1 mg/dL (ref 0.3–1.2)
Total Protein: 5.4 g/dL — ABNORMAL LOW (ref 6.5–8.1)

## 2019-08-24 LAB — GLUCOSE, CAPILLARY
Glucose-Capillary: 222 mg/dL — ABNORMAL HIGH (ref 70–99)
Glucose-Capillary: 140 mg/dL — ABNORMAL HIGH (ref 70–99)
Glucose-Capillary: 119 mg/dL — ABNORMAL HIGH (ref 70–99)
Glucose-Capillary: 117 mg/dL — ABNORMAL HIGH (ref 70–99)

## 2019-08-24 LAB — MAGNESIUM
Magnesium: 1.9 mg/dL (ref 1.7–2.4)
Magnesium: 1.5 mg/dL — ABNORMAL LOW (ref 1.7–2.4)

## 2019-08-24 LAB — TROPONIN I (HIGH SENSITIVITY): Troponin I (High Sensitivity): 1350 ng/L (ref <18)

## 2019-08-24 LAB — ECHOCARDIOGRAM COMPLETE
Height: 72 in
Weight: 2864.22 oz

## 2019-08-24 LAB — PHOSPHORUS
Phosphorus: 7.4 mg/dL — ABNORMAL HIGH (ref 2.5–4.6)
Phosphorus: 3.1 mg/dL (ref 2.5–4.6)

## 2019-08-24 LAB — TSH: TSH: 1.272 u[IU]/mL (ref 0.350–4.500)

## 2019-08-24 LAB — CK: Total CK: 4068 U/L — ABNORMAL HIGH (ref 49–397)

## 2019-08-24 LAB — MRSA PCR SCREENING: MRSA by PCR: NEGATIVE

## 2019-08-24 LAB — CORTISOL: Cortisol, Plasma: 42.4 ug/dL

## 2019-08-24 LAB — LACTIC ACID, PLASMA: Lactic Acid, Venous: 3.8 mmol/L (ref 0.5–1.9)

## 2019-08-24 MED ORDER — MAGNESIUM SULFATE 2 GM/50ML IV SOLN
2.0000 g | Freq: Once | INTRAVENOUS | Status: AC
  Administered 2019-08-24: 09:00:00 2 g via INTRAVENOUS
  Filled 2019-08-24: qty 50

## 2019-08-24 MED ORDER — DEXTROSE 5 % IV SOLN
250.0000 mg | Freq: Once | INTRAVENOUS | Status: DC
  Filled 2019-08-24 (×2): qty 250

## 2019-08-24 MED ORDER — FUROSEMIDE 10 MG/ML IJ SOLN
40.0000 mg | Freq: Once | INTRAMUSCULAR | Status: AC
  Administered 2019-08-24: 03:00:00 40 mg via INTRAVENOUS
  Filled 2019-08-24: qty 4

## 2019-08-24 MED ORDER — FENTANYL CITRATE (PF) 100 MCG/2ML IJ SOLN: 25.0000 ug | Freq: Once | INTRAMUSCULAR | Status: DC

## 2019-08-24 MED ORDER — VANCOMYCIN HCL 1500 MG/300ML IV SOLN: 1500.0000 mg | INTRAVENOUS | Status: DC

## 2019-08-24 MED ORDER — FENTANYL BOLUS VIA INFUSION
25.0000 ug | INTRAVENOUS | Status: DC | PRN
  Administered 2019-08-24: 07:00:00 25 ug via INTRAVENOUS
  Filled 2019-08-24: qty 25

## 2019-08-24 MED ORDER — DEXMEDETOMIDINE HCL IN NACL 400 MCG/100ML IV SOLN
0.4000 ug/kg/h | INTRAVENOUS | Status: DC
  Administered 2019-08-24: 1 ug/kg/h via INTRAVENOUS
  Filled 2019-08-24: qty 100
  Filled 2019-08-24: qty 50

## 2019-08-24 MED ORDER — SODIUM CHLORIDE 0.9 % IV SOLN
2.0000 g | Freq: Two times a day (BID) | INTRAVENOUS | Status: DC
  Administered 2019-08-24 – 2019-08-25 (×4): 2 g via INTRAVENOUS
  Filled 2019-08-24 (×4): qty 2

## 2019-08-24 MED ORDER — FENTANYL 2500MCG IN NS 250ML (10MCG/ML) PREMIX INFUSION
25.0000 ug/h | INTRAVENOUS | Status: DC
  Administered 2019-08-24: 19:00:00 200 ug/h via INTRAVENOUS
  Administered 2019-08-24: 50 ug/h via INTRAVENOUS
  Administered 2019-08-25 – 2019-08-27 (×5): 200 ug/h via INTRAVENOUS
  Administered 2019-08-28: 05:00:00 100 ug/h via INTRAVENOUS
  Administered 2019-08-28: 21:00:00 50 ug/h via INTRAVENOUS
  Filled 2019-08-24 (×9): qty 250

## 2019-08-24 MED ORDER — MIDAZOLAM HCL 2 MG/2ML IJ SOLN
1.0000 mg | INTRAMUSCULAR | Status: DC | PRN
  Administered 2019-08-24 (×6): 2 mg via INTRAVENOUS
  Administered 2019-08-25 – 2019-08-27 (×2): 1 mg via INTRAVENOUS
  Filled 2019-08-24 (×8): qty 2

## 2019-08-24 MED ORDER — PROPOFOL 1000 MG/100ML IV EMUL
5.0000 ug/kg/min | INTRAVENOUS | Status: DC
  Administered 2019-08-24: 10 ug/kg/min via INTRAVENOUS
  Administered 2019-08-25: 20:00:00 35 ug/kg/min via INTRAVENOUS
  Administered 2019-08-25 (×2): 25 ug/kg/min via INTRAVENOUS
  Administered 2019-08-26 (×4): 35 ug/kg/min via INTRAVENOUS
  Administered 2019-08-27: 03:00:00 30 ug/kg/min via INTRAVENOUS
  Filled 2019-08-24 (×10): qty 100

## 2019-08-24 MED ORDER — CALCIUM GLUCONATE-NACL 2-0.675 GM/100ML-% IV SOLN
2.0000 g | Freq: Once | INTRAVENOUS | Status: AC
  Administered 2019-08-24: 02:00:00 2000 mg via INTRAVENOUS
  Filled 2019-08-24: qty 100

## 2019-08-24 MED ORDER — CHLORHEXIDINE GLUCONATE CLOTH 2 % EX PADS
6.0000 | MEDICATED_PAD | Freq: Every day | CUTANEOUS | Status: DC
  Administered 2019-08-24 – 2019-08-31 (×8): 6 via TOPICAL

## 2019-08-24 MED ORDER — SODIUM ZIRCONIUM CYCLOSILICATE 10 G PO PACK
10.0000 g | PACK | Freq: Three times a day (TID) | ORAL | Status: DC
  Administered 2019-08-24: 10 g via ORAL
  Filled 2019-08-24: qty 1

## 2019-08-24 NOTE — Procedures (Signed)
ELECTROENCEPHALOGRAM REPORT   Patient: Gary Davis       Room #: [facility] EEG No. ID: [id number] Age: 64 y.o.        Sex: male Requesting Physician: Craige Cotta Report Date:  08/24/2019        Interpreting Physician: Thana Farr  History: BLAKE VETRANO is an 64 y.o. male s/p arrest  Medications:  Precedex, Fentanyl, Vancomycin, Cefepime  Conditions of Recording:  This is a 21 channel routine scalp EEG performed with bipolar and monopolar montages arranged in accordance to the international 10/20 system of electrode placement. One channel was dedicated to EKG recording.  The patient is in the intubated and sedated state.  Description:  The background activity is dominated by low voltage fast beta activity that is diffusely distributed.  This appears to be superimposed on a slow background that consists mostly of poorly organized theta activity.  This background activity is continuous.  No epileptiform activity is noted.   Hyperventilation and intermittent photic stimulation were not performed.   IMPRESSION: This is an abnormal electroencephalogram secondary to fast beta activity superimposed on a slow background.  This finding is consistent with the patient's medications.  No epileptiform activity is noted.     Thana Farr, MD Neurology [phone] 08/24/2019, 5:54 PM

## 2019-08-24 NOTE — Progress Notes (Addendum)
Pt w/ burst of agitation throughout the day, requiring intermittent versed admin. Pt very purposeful w/ movements, but unable to follow commands and unable to redirect, is combative with agitation. Attempt to use precedex unsuccessful r/t <BP and HR. Increase in fent resulting in improvemennt with less intense agitation and resolving faster.

## 2019-08-24 NOTE — Progress Notes (Signed)
eLink Physician-Brief Progress Note Patient Name: Gary Davis DOB: 1956/01/04 MRN: [id number]   Date of Service  08/24/2019  HPI/Events of Note  K 6.3.   eICU Interventions  - Lokelma 10mg  TID per OG tube for hyperkalemia. - Lasix 40mg  IV + Diuril 250mg  IV to promote K excretion. - Calcium gluconate 2g IV for cardioprotective effect.     Intervention Category Major Interventions: Electrolyte abnormality - evaluation and management  Beonka Amesquita 08/24/2019, 1:13 AM

## 2019-08-24 NOTE — Progress Notes (Signed)
  Echocardiogram 2D Echocardiogram has been performed.  Gary Davis F 08/24/2019, 1:36 PM

## 2019-08-24 NOTE — Procedures (Signed)
Arterial Catheter Insertion Procedure Note Gary Davis [id number] Jul 08, 1955  Procedure: Insertion of Arterial Catheter  Indications: Blood pressure monitoring  Procedure Details Consent: Risks of procedure as well as the alternatives and risks of each were explained to the (patient/caregiver).  Consent for procedure obtained. Time Out: Verified patient identification, verified procedure, site/side was marked, verified correct patient position, special equipment/implants available, medications/allergies/relevent history reviewed, required imaging and test results available.  Performed  Maximum sterile technique was used including antiseptics, cap, gloves, gown, hand hygiene, mask and sheet. Skin prep: Chlorhexidine; local anesthetic administered 20 gauge catheter was inserted into right femoral artery using the Seldinger technique. ULTRASOUND GUIDANCE USED: YES Evaluation Blood flow good; BP tracing good. Complications: No apparent complications.   Gary Davis Gary Davis 08/24/2019

## 2019-08-24 NOTE — Procedures (Signed)
Central Venous Catheter Insertion Procedure Note Gary Davis [id number] 02/14/1956  Procedure: Insertion of Central Venous Catheter Indications: Assessment of intravascular volume and Drug and/or fluid administration  Procedure Details Consent: Risks of procedure as well as the alternatives and risks of each were explained to the (patient/caregiver).  Consent for procedure obtained. Time Out: Verified patient identification, verified procedure, site/side was marked, verified correct patient position, special equipment/implants available, medications/allergies/relevent history reviewed, required imaging and test results available.  Performed  Maximum sterile technique was used including antiseptics, cap, gloves, gown, hand hygiene, mask and sheet. Skin prep: Chlorhexidine; local anesthetic administered A antimicrobial bonded/coated triple lumen catheter was placed in the right femoral vein due to multiple attempts, agitation using the Seldinger technique and verified with [place].  Evaluation Blood flow good Complications: No apparent complications Patient did tolerate procedure well. Chest X-ray ordered to verify placement.  CXR: not needed.  Gary Davis 08/24/2019, 1:14 AM

## 2019-08-24 NOTE — Progress Notes (Signed)
EEG complete - results pending 

## 2019-08-24 NOTE — Progress Notes (Addendum)
NAME:  Gary Davis, MRN:  [id number], DOB:  11-23-1955, LOS: 1 ADMISSION DATE:  08/23/2019, CONSULTATION DATE:  08/24/19 REFERRING MD: Jodi Mourning, CHIEF COMPLAINT:  Cardiac arrest   Brief History   64 y.o. M who was found unresponsive by a roommate with unknown downtime.  Last known well was 5 hours before.  EMS found patient unresponsive and pulseless, ROSC after 1 round of CPR.  Intubated and unresponsive in the ED and PCCM consulted for admission.  History of present illness   Gary Davis is a 64 year old male with past medical history of cocaine and alcohol abuse, depression and orthopedic problems who was found unresponsive by his roommate/fiance around 3 PM 08/23/2019.  Per reports, he was last seen well around 10 or 11 AM and was drinking alcohol and smoking cocaine.  He stopped breathing after fiance found him and she called EMS.  Narcan was administered without improvement, pulses were then lost and CPR was performed and he responded to 1 round without epi.  He was intubated and transported to the emergency department.  In the ED, he has not had any significant neurologic activity hours after after RSI medications.  Did not show signs of STEMI or significant ischemia,  labs were significant for lactic acid >11, respiratory acidosis, renal insufficiency, mild troponin anemia and negative flu and COVID-19.  He has required Levophed for hypotension and Bair hugger for hypothermia.    Past Medical History   has a past medical history of Alcohol abuse, Arthritis, Cataract, Chronic back pain, Depression, Fractures (04/2015), and GERD (gastroesophageal reflux disease).   Significant Hospital Events   2/20 Admit to PCCM  Consults:    Procedures:  2/20 ETT >> 2/20 right femoral CVL >>  Significant Diagnostic Tests:   2/20 CT head/chest/abdomen and pelvis>> old facial bone fractures, acute 10th rib fracture, right middle lobe 1.4 cm nodule, aspiration debris in trachea and left  mainstem bronchus, left perinephric hematoma  Micro Data:  2/20 SARS-Covid-2 and influenza>> negative 2/20 blood cultures x2>>  Antimicrobials:  Cefepime 2/20 Vancomycin 2/20 Flagyl 2/20  Interim history/subjective:   He is critically ill, intubated, on high-dose Levophed and vasopressin Sedated on low-dose fentanyl drip with breakthrough agitation this morning requiring Versed push   Objective   Blood pressure 110/81, pulse 88, temperature (!) 100.9 F (38.3 C), resp. rate 16, height 6' (1.829 m), weight 81.2 kg, SpO2 100 %.    Vent Mode: PRVC FiO2 (%):  [40 %-100 %] 40 % Set Rate:  [16 bmp] 16 bmp Vt Set:  [620 mL] 620 mL PEEP:  [5 cmH20] 5 cmH20 Plateau Pressure:  [13 cmH20-19 cmH20] 18 cmH20   Intake/Output Summary (Last 24 hours) at 08/24/2019 0932 Last data filed at 08/24/2019 0700 Gross per 24 hour  Intake 8967.07 ml  Output 3225 ml  Net 5742.07 ml   Filed Weights   08/23/19 2218  Weight: 81.2 kg    General: Well-built, well-nourished African-American male HEENT: MM pink/moist, pupils pinpoint and unresponsive Neuro: RASS -2 to +2, does not follow commands nonspecific agitation, pupils pinpoint CV: s1s2 RRR, no m/r/g PULM: Oral ETT, faint rhonchi on right GI: soft, bsx4 active  Extremities: warm/dry, no edema  Skin: no rashes or lesions   Chest x-ray 2/21 personally reviewed which shows no new infiltrates ET tube in position.  Labs show mild hypomagnesemia, slight elevated LFTs, CK and troponin rising  Resolved Hospital Problem list     Assessment & Plan:   Cardiac arrest with unclear  initial rhythm, subsequent hypercapnic respiratory failure and likely cardiogenic shock -In the setting of alcohol and cocaine use, no history of IV drug abuse or overdose per patient's brother -Elevated troponin with EKG showing A. fib LBBB P: -Obtain echocardiogram  -Continue Levophed and vasopressin    Acute respiratory failure Aspiration pneumonitis per CT,  no new infiltrates on chest x-ray Rib fracture left 10th --Maintain full vent support with SAT/SBT as tolerated -HOB elevated 30 degrees. -Bronchial hygiene and RT/bronchodilator protocol. -Continue empiric antibiotics while awaiting respiratory culture   Acute Encephalopathy, concern for anoxia -Hypothermic on arrival unknown downtime hence TTM deferred History of arthritis cervical fusion on chronic opioid therapy P: --Obtain EEG, consider neurology consult if remains unresponsive -Avoid fevers -Continue fentanyl drip with intermittent Versed, goal RASS -1-0 -Hold home baclofen and opioids while on fentanyl drip  Nonoliguric AKI , unclear chronicity -Last baseline creatinine was normal 3 years ago -Mild rhabdomyolysis P: --Monitor electrolytes  -DC bicarbonate drip once pH overshoots, repeat ABG at 3 PM -Hypomagnesemia will be repleted  Left perinephric hematoma -DC subcu heparin and use SCDs   Summary-appears to be cardiogenic shock, extreme agitation delaying neuro prognostication but hopefully is a good sign   Best practice:  Diet: N.p.o. Pain/Anxiety/Delirium protocol (if indicated): Fentanyl VAP protocol (if indicated): Yes, HOB at 30 degrees DVT prophylaxis: Heparin GI prophylaxis: Protonix Glucose control: SSI Mobility: Bedrest Code Status: Full code Family Communication:  brother Percell Miller Disposition: ICU   The patient is critically ill with multiple organ systems failure and requires high complexity decision making for assessment and support, frequent evaluation and titration of therapies, application of advanced monitoring technologies and extensive interpretation of multiple databases. Critical Care Time devoted to patient care services described in this note independent of APP/resident  time is 35 minutes.    Kara Mead MD. [facility]ade Flood. [facility]  If no response to pager , please call [phone]   08/24/2019

## 2019-08-24 NOTE — Progress Notes (Signed)
eLink Physician-Brief Progress Note Patient Name: DIOR STEPTER DOB: Mar 05, 1956 MRN: [id number]   Date of Service  08/24/2019  HPI/Events of Note  Notified of episodes of agitation with patient trying to get off bed but still not able to follow commands. Fentanyl increased to 200 mcg at 5:40 and was given a Versed push at 6:45 and again at 8:27 with a Fentanyl bolus. Patient did not tolerate Precedex previously due to bradycardia and hypotenison  eICU Interventions  Pressor requirement has come down since yesterday. Will try using propofol for sedation given ETOH abuse and hepatic insufficiency. Discussed with bedside RN to start at low dose and to inform Elink if pressor requirement increases significantly.     Intervention Category Minor Interventions: Agitation / anxiety - evaluation and management  Darl Pikes 08/24/2019, 9:22 PM

## 2019-08-24 NOTE — Progress Notes (Signed)
Pharmacy Antibiotic Note  Gary Davis is a 64 y.o. male admitted on 08/23/2019 as post-CPR. Pharmacy has been consulted for cefepime and vancomycin dosing for sepsis/PNA - day #2. SCr improving - 3.64>>2.63 (baseline possibly ~1, no recent values).  Patient was loaded with vancomycin 1500mg  IV x 1 on 2/20.  Plan: Continue vancomycin 1500 mg IV Q 48 hrs. Goal AUC 400-550. Expected AUC: 429 SCr used: 2.63 Cefepime to 2g IV q12h for improving renal function Monitor clinical progress, c/s, renal function F/u de-escalation plan/LOT, vancomycin levels as indicated   Height: 6' (182.9 cm) Weight: 179 lb 0.2 oz (81.2 kg) IBW/kg (Calculated) : 77.6  Temp (24hrs), Avg:96.6 F (35.9 C), Min:87.8 F (31 C), Max:101.8 F (38.8 C)  Recent Labs  Lab 08/23/19 1712 08/23/19 1713 08/23/19 1720 08/23/19 1905 08/23/19 2310 08/24/19 0500 08/24/19 0525  WBC  --   --  12.4*  --   --   --  9.4  CREATININE 3.40*  --  3.64*  --  2.90*  --  2.63*  LATICACIDVEN  --  >11.0*  --  >11.0* 9.1* 3.8*  --     Estimated Creatinine Clearance: 30.7 mL/min (A) (by C-G formula based on SCr of 2.63 mg/dL (H)).    Allergies  Allergen Reactions  . Hydrocodone Itching and Nausea And Vomiting  . Percocet [Oxycodone-Acetaminophen] Nausea And Vomiting    Can take oxycodone without tylenol additive  . Tramadol Nausea Only    Antimicrobials this admission: Vanc 2/20>> Cefepime 2/20>>  Dose adjustments this admission:   Microbiology results: 2/20 BCx: sent 2/20 UCx: sent  3/20, PharmD Clinical Pharmacist Please check AMION for all [facility] Pharm[facility] numbers 08/24/2019 1:04 PM

## 2019-08-24 NOTE — Progress Notes (Signed)
Pt now wiith spontaneous attempts to squint eyes open, spontaneous movement of tongue and lip noted.

## 2019-08-25 LAB — BASIC METABOLIC PANEL
Anion gap: 19 — ABNORMAL HIGH (ref 5–15)
Anion gap: 11 (ref 5–15)
BUN: 20 mg/dL (ref 8–23)
BUN: 24 mg/dL — ABNORMAL HIGH (ref 8–23)
CO2: 15 mmol/L — ABNORMAL LOW (ref 22–32)
CO2: 24 mmol/L (ref 22–32)
Calcium: 8 mg/dL — ABNORMAL LOW (ref 8.9–10.3)
Calcium: 8.5 mg/dL — ABNORMAL LOW (ref 8.9–10.3)
Chloride: 106 mmol/L (ref 98–111)
Chloride: 104 mmol/L (ref 98–111)
Creatinine, Ser: 2.9 mg/dL — ABNORMAL HIGH (ref 0.61–1.24)
Creatinine, Ser: 1.52 mg/dL — ABNORMAL HIGH (ref 0.61–1.24)
GFR calc Af Amer: 25 mL/min — ABNORMAL LOW (ref >60)
GFR calc Af Amer: 55 mL/min — ABNORMAL LOW (ref >60)
GFR calc non Af Amer: 22 mL/min — ABNORMAL LOW (ref >60)
GFR calc non Af Amer: 47 mL/min — ABNORMAL LOW (ref >60)
Glucose, Bld: 187 mg/dL — ABNORMAL HIGH (ref 70–99)
Glucose, Bld: 92 mg/dL (ref 70–99)
Potassium: 6.3 mmol/L (ref 3.5–5.1)
Potassium: 3.6 mmol/L (ref 3.5–5.1)
Sodium: 140 mmol/L (ref 135–145)
Sodium: 139 mmol/L (ref 135–145)

## 2019-08-25 LAB — GLUCOSE, CAPILLARY
Glucose-Capillary: 90 mg/dL (ref 70–99)
Glucose-Capillary: 87 mg/dL (ref 70–99)
Glucose-Capillary: 84 mg/dL (ref 70–99)
Glucose-Capillary: 90 mg/dL (ref 70–99)
Glucose-Capillary: 82 mg/dL (ref 70–99)
Glucose-Capillary: 93 mg/dL (ref 70–99)
Glucose-Capillary: 106 mg/dL — ABNORMAL HIGH (ref 70–99)

## 2019-08-25 LAB — URINE CULTURE: Culture: NO GROWTH

## 2019-08-25 LAB — MAGNESIUM
Magnesium: 2 mg/dL (ref 1.7–2.4)
Magnesium: 2 mg/dL (ref 1.7–2.4)
Magnesium: 1.9 mg/dL (ref 1.7–2.4)

## 2019-08-25 LAB — PHOSPHORUS
Phosphorus: 2.4 mg/dL — ABNORMAL LOW (ref 2.5–4.6)
Phosphorus: 2.1 mg/dL — ABNORMAL LOW (ref 2.5–4.6)
Phosphorus: 1.9 mg/dL — ABNORMAL LOW (ref 2.5–4.6)

## 2019-08-25 LAB — CBC
HCT: 34.1 % — ABNORMAL LOW (ref 39.0–52.0)
Hemoglobin: 11.6 g/dL — ABNORMAL LOW (ref 13.0–17.0)
MCH: 33.7 pg (ref 26.0–34.0)
MCHC: 34 g/dL (ref 30.0–36.0)
MCV: 99.1 fL (ref 80.0–100.0)
Platelets: 126 10*3/uL — ABNORMAL LOW (ref 150–400)
RBC: 3.44 MIL/uL — ABNORMAL LOW (ref 4.22–5.81)
RDW: 13.2 % (ref 11.5–15.5)
WBC: 9.3 10*3/uL (ref 4.0–10.5)
nRBC: 0.2 % (ref 0.0–0.2)

## 2019-08-25 LAB — LACTIC ACID, PLASMA: Lactic Acid, Venous: 1.2 mmol/L (ref 0.5–1.9)

## 2019-08-25 LAB — TRIGLYCERIDES: Triglycerides: 89 mg/dL (ref <150)

## 2019-08-25 MED ORDER — CHLORHEXIDINE GLUCONATE 0.12 % MT SOLN
OROMUCOSAL | Status: AC
  Filled 2019-08-25: qty 15

## 2019-08-25 MED ORDER — VITAL HIGH PROTEIN PO LIQD
1000.0000 mL | ORAL | Status: DC
  Administered 2019-08-25: 1000 mL

## 2019-08-25 MED ORDER — VITAL AF 1.2 CAL PO LIQD
1000.0000 mL | ORAL | Status: DC
  Administered 2019-08-25 – 2019-08-28 (×2): 1000 mL
  Filled 2019-08-25: qty 1000

## 2019-08-25 MED ORDER — PRO-STAT SUGAR FREE PO LIQD
30.0000 mL | Freq: Two times a day (BID) | ORAL | Status: DC
  Administered 2019-08-25 – 2019-08-28 (×8): 30 mL
  Filled 2019-08-25 (×8): qty 30

## 2019-08-25 NOTE — Progress Notes (Addendum)
Received pt from ED. Pt with spontaneous protrudes tongue and moves lips. GCS 3 and pinpoint pupils.  Levophed drip at 25 mcg/min via LPIV. ETT 28 @ lip. RR unlabored, vss

## 2019-08-25 NOTE — Progress Notes (Signed)
NAME:  Gary Davis, MRN:  [id number], DOB:  [birth year], LOS: 2 ADMISSION DATE:  08/23/2019, CONSULTATION DATE:  08/25/19 REFERRING MD: Reather Converse, CHIEF COMPLAINT:  Cardiac arrest   Brief History   64 y.o. M who was found unresponsive by a roommate with unknown downtime.  Last known well was 5 hours before.  EMS found patient unresponsive and pulseless, ROSC after 1 round of CPR.  Intubated and unresponsive in the ED and PCCM consulted for admission.  History of present illness   Gary Davis is a 64 year old male with past medical history of cocaine and alcohol abuse, depression and orthopedic problems who was found unresponsive by his roommate/fiance around 3 PM 08/23/2019.  Per reports, he was last seen well around 10 or 11 AM and was drinking alcohol and smoking cocaine.  He stopped breathing after fiance found him and she called EMS.  Narcan was administered without improvement, pulses were then lost and CPR was performed and he responded to 1 round without epi.  He was intubated and transported to the emergency department.  In the ED, he has not had any significant neurologic activity hours after after RSI medications.  Did not show signs of STEMI or significant ischemia,  labs were significant for lactic acid >11, respiratory acidosis, renal insufficiency, mild troponin anemia and negative flu and COVID-19.  He has required Levophed for hypotension and Bair hugger for hypothermia.    Past Medical History   has a past medical history of Alcohol abuse, Arthritis, Cataract, Chronic back pain, Depression, Fractures (04/2015), and GERD (gastroesophageal reflux disease).   Significant Hospital Events   2/20 Admit to PCCM  Consults:    Procedures:  2/20 ETT >> 2/20 right femoral CVL >>  Significant Diagnostic Tests:   2/20 CT head/chest/abdomen and pelvis>> old facial bone fractures, acute 10th rib fracture, right middle lobe 1.4 cm nodule, aspiration debris in trachea and left  mainstem bronchus, left perinephric hematoma 2/21 echo:  LVEF 30%, RV with reduced EF as well.   Micro Data:  2/20 SARS-Covid-2 and influenza>> negative 2/20 blood cultures x2>>ngtd 2/20 urine: neg  Antimicrobials:  Cefepime 2/20-> Vancomycin 2/20->2/22 Flagyl 2/20  Interim history/subjective:  2/22: remains sedated intubated, unresponsive on sedation. Off vaso and norepi held this am. Did req increased sedation overnight, now on propofol.  2/21:He is critically ill, intubated, on high-dose Levophed and vasopressin Sedated on low-dose fentanyl drip with breakthrough agitation this morning requiring Versed push   Objective   Blood pressure 92/60, pulse (!) 57, temperature (!) 97.5 F (36.4 C), resp. rate 16, height 6' (1.829 m), weight 81.2 kg, SpO2 100 %. CVP:  [19 mmHg] 19 mmHg  Vent Mode: PRVC FiO2 (%):  [40 %] 40 % Set Rate:  [16 bmp] 16 bmp Vt Set:  [620 mL] 620 mL PEEP:  [5 cmH20] 5 cmH20 Plateau Pressure:  [15 cmH20-28 cmH20] 24 cmH20   Intake/Output Summary (Last 24 hours) at 08/25/2019 0901 Last data filed at 08/25/2019 0700 Gross per 24 hour  Intake 1102.2 ml  Output 1775 ml  Net -672.8 ml   Filed Weights   08/23/19 2218  Weight: 81.2 kg    General: Well-built, well-nourished African-American male HEENT: MM pink/moist, pupils pinpoint and unresponsive Neuro: RASS -2 to +2, does not follow commands nonspecific agitation, pupils pinpoint CV: s1s2 RRR, no m/r/g PULM: Oral ETT, faint rhonchi on right GI: soft, bsx4 active  Extremities: warm/dry, no edema  Skin: no rashes or lesions   Lab Results  Component Value Date   WBC 9.3 08/25/2019   HGB 11.6 (L) 08/25/2019   HCT 34.1 (L) 08/25/2019   MCV 99.1 08/25/2019   PLT 126 (L) 08/25/2019   This SmartLink has not been configured with any valid records.     Lab Results  Component Value Date   CREATININE 1.52 (H) 08/25/2019   BUN 24 (H) 08/25/2019   NA 139 08/25/2019   K 3.6 08/25/2019   CL 104  08/25/2019   CO2 24 08/25/2019    Resolved Hospital Problem list     Assessment & Plan:   Cardiac arrest with unclear initial rhythm, subsequent hypercapnic respiratory failure and likely cardiogenic shock -In the setting of alcohol and cocaine use, no history of IV drug abuse or overdose per patient's brother -Elevated troponin with EKG showing A. fib LBBB P: -echo with poor LV and RV function -Continue Levophed as needed -d/c vaso    Acute respiratory failure Aspiration pneumonitis per CT, no new infiltrates on chest x-ray Rib fracture left 10th --Maintain full vent support with SAT/SBT as tolerated -HOB elevated 30 degrees. -Bronchial hygiene and RT/bronchodilator protocol. -de-escalate abx to cefepime only -resp cx never obtained and no clear infiltrates.  -will check cxr tomorrow and resp cx today and if negative then will d/c cefepime in am.    Acute Encephalopathy, concern for anoxia -Hypothermic on arrival unknown downtime hence TTM deferred History of arthritis cervical fusion on chronic opioid therapy P: --eeg without seizure activity, consider neurology consult if remains unresponsive -Avoid fevers -Continue fentanyl drip with intermittent Versed and new propofol, goal RASS -1-0 -Hold home baclofen and opioids while on fentanyl drip  Nonoliguric AKI , unclear chronicity -Last baseline creatinine was normal 3 years ago -indices improving -f/u uop rhabdomyolysis -cr improving -recheck ck in am.  Hypomagnesemia:  -resolved  Left perinephric hematoma -DC subcu heparin and use SCDs Thrombocytopenia:  -no acute indication for transfusion -follow Normocytic anemia:  -follow, no overt bleeding noted but does have hematoma as noted above and hgb is downtrending     lactic acidosis:  -recheck pending -remained elevated at 3.8  Best practice:  Diet: tube feeds Pain/Anxiety/Delirium protocol (if indicated): per protocol VAP protocol (if indicated):  Yes, HOB at 30 degrees DVT prophylaxis: Heparin GI prophylaxis: Protonix Glucose control: SSI Mobility: Bedrest Code Status: Full code Family Communication:  brother [name] Disposition: ICU   Critical care time: The patient is critically ill with multiple organ systems failure and requires high complexity decision making for assessment and support, frequent evaluation and titration of therapies, application of advanced monitoring technologies and extensive interpretation of multiple databases.  Critical care time 43 mins. This represents my time independent of the NPs time taking care of the pt. This is excluding procedures.    Briant Sites DO [facility] 08/25/2019, 9:01 AM

## 2019-08-25 NOTE — Progress Notes (Signed)
Initial Nutrition Assessment  DOCUMENTATION CODES:   Not applicable  INTERVENTION:   Tube Feeding:  Vital AF 1.2 at 50 ml/hr Pro-Stat 30 mL BID TF regimen provides 120 g of protein, 1640 kcals and 972 mL of free water  TF regimen and propofol at current rate providing 1962 total kcal/day (100 % of kcal needs)   NUTRITION DIAGNOSIS:   Inadequate oral intake related to acute illness, inability to eat as evidenced by NPO status.  GOAL:   Patient will meet greater than or equal to 90% of their needs  MONITOR:   Vent status, TF tolerance, Weight trends, Labs  REASON FOR ASSESSMENT:   Consult, Ventilator Enteral/tube feeding initiation and management  ASSESSMENT:   64 yo male admitted after being found unresponsive and admitted post cardiac arrest with subsequent respiratory failure and cardiogenic shock, nonoliguric AKI. PMH includes cocaine and EtOH abuse, depression   RD working remotely.  2/20 Intubated, Admit  Hypothermic on arrival with unknown downtime, TTM deferred  Patient is currently intubated on ventilator support, sedated with fentanyl and propofol MV: 10.6 L/min Temp (24hrs), Avg:98.1 F (36.7 C), Min:97 F (36.1 C), Max:99.9 F (37.7 C)  Propofol: 12.2 ml/hr  Current wt 81.2 kg; no recent weights available.   Labs: Creatinine 1.52, BUN 24 Meds: D5-NS at 100 ml/hr   Diet Order:   Diet Order            Diet NPO time specified  Diet effective now              EDUCATION NEEDS:   Not appropriate for education at this time  Skin:  Skin Assessment: Reviewed RN Assessment  Last BM:  no documented BM  Height:   Ht Readings from Last 1 Encounters:  08/23/19 6' (1.829 m)    Weight:   Wt Readings from Last 1 Encounters:  08/23/19 81.2 kg    BMI:  Body mass index is 24.28 kg/m.  Estimated Nutritional Needs:   Kcal:  1958 kcals  Protein:  100-125 g  Fluid:  >/= 1.9 L    Romelle Starcher MS, RDN, LDN, CNSC RD Pager Number and  Weekend/On-Call After Hours Pager Located in [place]

## 2019-08-26 ENCOUNTER — Inpatient Hospital Stay (HOSPITAL_COMMUNITY): Payer: Medicare Other

## 2019-08-26 LAB — CBC
HCT: 33.8 % — ABNORMAL LOW (ref 39.0–52.0)
Hemoglobin: 11.4 g/dL — ABNORMAL LOW (ref 13.0–17.0)
MCH: 33.8 pg (ref 26.0–34.0)
MCHC: 33.7 g/dL (ref 30.0–36.0)
MCV: 100.3 fL — ABNORMAL HIGH (ref 80.0–100.0)
Platelets: 103 10*3/uL — ABNORMAL LOW (ref 150–400)
RBC: 3.37 MIL/uL — ABNORMAL LOW (ref 4.22–5.81)
RDW: 13.2 % (ref 11.5–15.5)
WBC: 6.8 10*3/uL (ref 4.0–10.5)
nRBC: 0 % (ref 0.0–0.2)

## 2019-08-26 LAB — COMPREHENSIVE METABOLIC PANEL
ALT: 81 U/L — ABNORMAL HIGH (ref 0–44)
AST: 133 U/L — ABNORMAL HIGH (ref 15–41)
Albumin: 2.7 g/dL — ABNORMAL LOW (ref 3.5–5.0)
Alkaline Phosphatase: 58 U/L (ref 38–126)
Anion gap: 9 (ref 5–15)
BUN: 17 mg/dL (ref 8–23)
CO2: 26 mmol/L (ref 22–32)
Calcium: 8.5 mg/dL — ABNORMAL LOW (ref 8.9–10.3)
Chloride: 106 mmol/L (ref 98–111)
Creatinine, Ser: 1.31 mg/dL — ABNORMAL HIGH (ref 0.61–1.24)
GFR calc Af Amer: >60 mL/min (ref >60)
GFR calc non Af Amer: 57 mL/min — ABNORMAL LOW (ref >60)
Glucose, Bld: 121 mg/dL — ABNORMAL HIGH (ref 70–99)
Potassium: 3.4 mmol/L — ABNORMAL LOW (ref 3.5–5.1)
Sodium: 141 mmol/L (ref 135–145)
Total Bilirubin: 0.8 mg/dL (ref 0.3–1.2)
Total Protein: 5.2 g/dL — ABNORMAL LOW (ref 6.5–8.1)

## 2019-08-26 LAB — GLUCOSE, CAPILLARY
Glucose-Capillary: 110 mg/dL — ABNORMAL HIGH (ref 70–99)
Glucose-Capillary: 106 mg/dL — ABNORMAL HIGH (ref 70–99)
Glucose-Capillary: 118 mg/dL — ABNORMAL HIGH (ref 70–99)
Glucose-Capillary: 105 mg/dL — ABNORMAL HIGH (ref 70–99)
Glucose-Capillary: 117 mg/dL — ABNORMAL HIGH (ref 70–99)
Glucose-Capillary: 125 mg/dL — ABNORMAL HIGH (ref 70–99)

## 2019-08-26 LAB — MAGNESIUM
Magnesium: 1.8 mg/dL (ref 1.7–2.4)
Magnesium: 1.9 mg/dL (ref 1.7–2.4)

## 2019-08-26 LAB — PHOSPHORUS
Phosphorus: 2.4 mg/dL — ABNORMAL LOW (ref 2.5–4.6)
Phosphorus: 2.2 mg/dL — ABNORMAL LOW (ref 2.5–4.6)

## 2019-08-26 LAB — CK: Total CK: 2310 U/L — ABNORMAL HIGH (ref 49–397)

## 2019-08-26 MED ORDER — MAGNESIUM SULFATE IN D5W 1-5 GM/100ML-% IV SOLN
1.0000 g | Freq: Once | INTRAVENOUS | Status: AC
  Administered 2019-08-26: 1 g via INTRAVENOUS
  Filled 2019-08-26: qty 100

## 2019-08-26 NOTE — Progress Notes (Signed)
NAME:  Gary Davis, MRN:  [id number], DOB:  12/24/1955, LOS: 3 ADMISSION DATE:  08/23/2019, CONSULTATION DATE:  08/26/19 REFERRING MD: Reather Converse, CHIEF COMPLAINT:  Cardiac arrest   Brief History   64 y.o. M who was found unresponsive by a roommate with unknown downtime.  Last known well was 5 hours before.  EMS found patient unresponsive and pulseless, ROSC after 1 round of CPR.  Intubated and unresponsive in the ED and PCCM consulted for admission.  History of present illness   Gary Davis is a 64 year old male with past medical history of cocaine and alcohol abuse, depression and orthopedic problems who was found unresponsive by his roommate/fiance around 3 PM 08/23/2019.  Per reports, he was last seen well around 10 or 11 AM and was drinking alcohol and smoking cocaine.  He stopped breathing after fiance found him and she called EMS.  Narcan was administered without improvement, pulses were then lost and CPR was performed and he responded to 1 round without epi.  He was intubated and transported to the emergency department.  In the ED, he has not had any significant neurologic activity hours after after RSI medications.  Did not show signs of STEMI or significant ischemia,  labs were significant for lactic acid >11, respiratory acidosis, renal insufficiency, mild troponin anemia and negative flu and COVID-19.  He has required Levophed for hypotension and Bair hugger for hypothermia.    Past Medical History   has a past medical history of Alcohol abuse, Arthritis, Cataract, Chronic back pain, Depression, Fractures (04/2015), and GERD (gastroesophageal reflux disease).   Significant Hospital Events   2/20 Admit to PCCM  Consults:    Procedures:  2/20 ETT >> 2/20 right femoral CVL >>  Significant Diagnostic Tests:   2/20 CT head/chest/abdomen and pelvis>> old facial bone fractures, acute 10th rib fracture, right middle lobe 1.4 cm nodule, aspiration debris in trachea and left  mainstem bronchus, left perinephric hematoma 2/21 echo:  LVEF 30%, RV with reduced EF as well.   Micro Data:  2/20 SARS-Covid-2 and influenza>> negative 2/20 blood cultures x2>>ngtd 2/20 urine: neg 2/22 resp: ngtd  Antimicrobials:  Cefepime 2/20-> Vancomycin 2/20->2/22 Flagyl 2/20  Interim history/subjective:  2/23: intubated and sedated. Attempting to wean sedation for sbt. Not following commands when sedation weaned but agitated and purposeful (reaching for lines and tube) 2/22: remains sedated intubated, unresponsive on sedation. Off vaso and norepi held this am. Did req increased sedation overnight, now on propofol.  2/21:He is critically ill, intubated, on high-dose Levophed and vasopressin Sedated on low-dose fentanyl drip with breakthrough agitation this morning requiring Versed push   Objective   Blood pressure 114/71, pulse (!) 56, temperature 98.8 F (37.1 C), resp. rate 17, height 6' (1.829 m), weight 86.9 kg, SpO2 100 %.    Vent Mode: PSV;CPAP FiO2 (%):  [40 %] 40 % Set Rate:  [16 bmp] 16 bmp Vt Set:  [620 mL] 620 mL PEEP:  [5 cmH20] 5 cmH20 Pressure Support:  [10 cmH20] 10 cmH20 Plateau Pressure:  [19 cmH20-29 cmH20] 29 cmH20   Intake/Output Summary (Last 24 hours) at 08/26/2019 0934 Last data filed at 08/26/2019 0900 Gross per 24 hour  Intake 1637.45 ml  Output 1930 ml  Net -292.55 ml   Filed Weights   08/23/19 2218 08/26/19 0500  Weight: 81.2 kg 86.9 kg    General: Well-built, well-nourished African-American male HEENT: MM pink/moist, pupils pinpoint and unresponsive Neuro: RASS -2 to +2, does not follow commands nonspecific agitation,  pupils pinpoint CV: s1s2 RRR, no m/r/g PULM: Oral ETT, faint rhonchi on right GI: soft, bsx4 active  Extremities: warm/dry, no edema  Skin: no rashes or lesions   Lab Results  Component Value Date   WBC 6.8 08/26/2019   HGB 11.4 (L) 08/26/2019   HCT 33.8 (L) 08/26/2019   MCV 100.3 (H) 08/26/2019   PLT 103 (L)  08/26/2019   This SmartLink has not been configured with any valid records.     Lab Results  Component Value Date   CREATININE 1.31 (H) 08/26/2019   BUN 17 08/26/2019   NA 141 08/26/2019   K 3.4 (L) 08/26/2019   CL 106 08/26/2019   CO2 26 08/26/2019    Resolved Hospital Problem list     Assessment & Plan:   Cardiac arrest with unclear initial rhythm, subsequent hypercapnic respiratory failure and likely cardiogenic shock -In the setting of alcohol and cocaine use, no history of IV drug abuse or overdose per patient's brother -Elevated troponin with EKG showing A. fib LBBB P: -echo with poor LV and RV function -off pressors    Acute respiratory failure Aspiration pneumonitis per CT, no new infiltrates on chest x-ray Rib fracture left 10th --Maintain full vent support with SAT/SBT as tolerated -HOB elevated 30 degrees. -Bronchial hygiene and RT/bronchodilator protocol. -d/c cefepime -resp cx never obtained and no infiltrates on cxr (personally reviewed by me on 2/23)    Acute Encephalopathy, concern for anoxia -Hypothermic on arrival unknown downtime hence TTM deferred History of arthritis cervical fusion on chronic opioid therapy P: --eeg without seizure activity, consider neurology consult if remains unresponsive -Avoid fevers -Continue fentanyl drip with intermittent Versed and new propofol, goal RASS -1-0 -Hold home baclofen and opioids while on fentanyl drip  Nonoliguric AKI , unclear chronicity -Last baseline creatinine was normal 3 years ago -indices improving -f/u uop rhabdomyolysis -cr improving -ck downtrending Hypomagnesemia:  -replace  Left perinephric hematoma -DC subcu heparin and use SCDs Thrombocytopenia:  -no acute indication for transfusion -follow Normocytic anemia:  -follow, no overt bleeding noted but does have hematoma as noted above and -hgb is stable today   lactic acidosis:  -resolved  Best practice:  Diet: tube  feeds Pain/Anxiety/Delirium protocol (if indicated): per protocol VAP protocol (if indicated): Yes, HOB at 30 degrees DVT prophylaxis: Heparin GI prophylaxis: Protonix Glucose control: SSI Mobility: Bedrest Code Status: Full code Family Communication:  brother [name] via phone 2/23 Disposition: ICU   Critical care time: The patient is critically ill with multiple organ systems failure and requires high complexity decision making for assessment and support, frequent evaluation and titration of therapies, application of advanced monitoring technologies and extensive interpretation of multiple databases.  Critical care time 40 mins. This represents my time independent of the NPs time taking care of the pt. This is excluding procedures.    Briant Sites DO [facility] 08/26/2019, 9:34 AM

## 2019-08-27 DIAGNOSIS — T68XXXA Hypothermia, initial encounter: Secondary | ICD-10-CM

## 2019-08-27 DIAGNOSIS — R4189 Other symptoms and signs involving cognitive functions and awareness: Secondary | ICD-10-CM

## 2019-08-27 LAB — COMPREHENSIVE METABOLIC PANEL
ALT: 66 U/L — ABNORMAL HIGH (ref 0–44)
AST: 94 U/L — ABNORMAL HIGH (ref 15–41)
Albumin: 2.6 g/dL — ABNORMAL LOW (ref 3.5–5.0)
Alkaline Phosphatase: 58 U/L (ref 38–126)
Anion gap: 8 (ref 5–15)
BUN: 14 mg/dL (ref 8–23)
CO2: 28 mmol/L (ref 22–32)
Calcium: 8.6 mg/dL — ABNORMAL LOW (ref 8.9–10.3)
Chloride: 108 mmol/L (ref 98–111)
Creatinine, Ser: 1.36 mg/dL — ABNORMAL HIGH (ref 0.61–1.24)
GFR calc Af Amer: >60 mL/min (ref >60)
GFR calc non Af Amer: 54 mL/min — ABNORMAL LOW (ref >60)
Glucose, Bld: 134 mg/dL — ABNORMAL HIGH (ref 70–99)
Potassium: 3.3 mmol/L — ABNORMAL LOW (ref 3.5–5.1)
Sodium: 144 mmol/L (ref 135–145)
Total Bilirubin: 1.1 mg/dL (ref 0.3–1.2)
Total Protein: 5.5 g/dL — ABNORMAL LOW (ref 6.5–8.1)

## 2019-08-27 LAB — CBC
HCT: 34.9 % — ABNORMAL LOW (ref 39.0–52.0)
Hemoglobin: 11.7 g/dL — ABNORMAL LOW (ref 13.0–17.0)
MCH: 33.4 pg (ref 26.0–34.0)
MCHC: 33.5 g/dL (ref 30.0–36.0)
MCV: 99.7 fL (ref 80.0–100.0)
Platelets: 108 10*3/uL — ABNORMAL LOW (ref 150–400)
RBC: 3.5 MIL/uL — ABNORMAL LOW (ref 4.22–5.81)
RDW: 13.1 % (ref 11.5–15.5)
WBC: 6.2 10*3/uL (ref 4.0–10.5)
nRBC: 0 % (ref 0.0–0.2)

## 2019-08-27 LAB — GLUCOSE, CAPILLARY
Glucose-Capillary: 122 mg/dL — ABNORMAL HIGH (ref 70–99)
Glucose-Capillary: 120 mg/dL — ABNORMAL HIGH (ref 70–99)
Glucose-Capillary: 114 mg/dL — ABNORMAL HIGH (ref 70–99)
Glucose-Capillary: 119 mg/dL — ABNORMAL HIGH (ref 70–99)
Glucose-Capillary: 116 mg/dL — ABNORMAL HIGH (ref 70–99)

## 2019-08-27 LAB — TRIGLYCERIDES: Triglycerides: 128 mg/dL (ref <150)

## 2019-08-27 LAB — CK: Total CK: 1438 U/L — ABNORMAL HIGH (ref 49–397)

## 2019-08-27 MED ORDER — DEXMEDETOMIDINE HCL IN NACL 400 MCG/100ML IV SOLN: 0.4000 ug/kg/h | INTRAVENOUS | Status: DC

## 2019-08-27 MED ORDER — OXYCODONE HCL 5 MG/5ML PO SOLN
5.0000 mg | Freq: Four times a day (QID) | ORAL | Status: DC
  Administered 2019-08-27: 11:00:00 5 mg via ORAL
  Filled 2019-08-27: qty 5

## 2019-08-27 MED ORDER — PROPOFOL 1000 MG/100ML IV EMUL
5.0000 ug/kg/min | INTRAVENOUS | Status: DC
  Administered 2019-08-27: 08:00:00 30 ug/kg/min via INTRAVENOUS
  Administered 2019-08-27: 17:00:00 20 ug/kg/min via INTRAVENOUS
  Administered 2019-08-28: 07:00:00 10 ug/kg/min via INTRAVENOUS
  Administered 2019-08-28: 02:00:00 25 ug/kg/min via INTRAVENOUS
  Filled 2019-08-27 (×3): qty 100

## 2019-08-27 MED ORDER — OXYCODONE HCL 5 MG/5ML PO SOLN
5.0000 mg | Freq: Four times a day (QID) | ORAL | Status: DC
  Administered 2019-08-27 – 2019-08-28 (×4): 5 mg
  Filled 2019-08-27 (×4): qty 5

## 2019-08-27 MED ORDER — POTASSIUM CHLORIDE 20 MEQ/15ML (10%) PO SOLN
20.0000 meq | ORAL | Status: AC
  Administered 2019-08-27 (×2): 20 meq
  Filled 2019-08-27 (×3): qty 15

## 2019-08-27 MED ORDER — CLONAZEPAM 0.1 MG/ML ORAL SUSPENSION
1.0000 mg | Freq: Two times a day (BID) | ORAL | Status: DC
  Filled 2019-08-27 (×2): qty 10

## 2019-08-27 MED ORDER — CLONAZEPAM 0.5 MG PO TBDP
1.0000 mg | ORAL_TABLET | Freq: Two times a day (BID) | ORAL | Status: DC
  Administered 2019-08-27 – 2019-08-28 (×3): 1 mg via ORAL
  Filled 2019-08-27 (×3): qty 2

## 2019-08-27 NOTE — Progress Notes (Signed)
[facility] ADULT ICU REPLACEMENT PROTOCOL FOR AM LAB REPLACEMENT ONLY  The patient does apply for the [facility] Adult ICU Electrolyte Replacment Protocol based on the criteria listed below:   1. Is GFR >/= 40 ml/min? Yes.    Patient's GFR today is 54 2. Is urine output >/= 0.5 ml/kg/hr for the last 6 hours? Yes.   Patient's UOP is 0.78 ml/kg/hr 3. Is BUN < 60 mg/dL? Yes.    Patient's BUN today is 14 4. Abnormal electrolyte(s):Potassium 3.3 5. Ordered repletion with: Potassium per protocol 6. If a panic level lab has been reported, has the CCM MD in charge been notified? No..   Physician:    Thomasenia Bottoms 08/27/2019 5:47 AM

## 2019-08-27 NOTE — Progress Notes (Signed)
NAME:  Gary Davis, MRN:  [id number], DOB:  10/29/1955, LOS: 4 ADMISSION DATE:  08/23/2019, CONSULTATION DATE:  08/27/19 REFERRING MD: Reather Converse, CHIEF COMPLAINT:  Cardiac arrest   Brief History   64 y.o. M who was found unresponsive by a roommate with unknown downtime.  Last known well was 5 hours before.  EMS found patient unresponsive and pulseless, ROSC after 1 round of CPR.  Intubated and unresponsive in the ED and PCCM consulted for admission.  History of present illness   Gary Davis is a 64 year old male with past medical history of cocaine and alcohol abuse, depression and orthopedic problems who was found unresponsive by his roommate/fiance around 3 PM 08/23/2019.  Per reports, he was last seen well around 10 or 11 AM and was drinking alcohol and smoking cocaine.  He stopped breathing after fiance found him and she called EMS.  Narcan was administered without improvement, pulses were then lost and CPR was performed and he responded to 1 round without epi.  He was intubated and transported to the emergency department.  In the ED, he has not had any significant neurologic activity hours after after RSI medications.  Did not show signs of STEMI or significant ischemia,  labs were significant for lactic acid >11, respiratory acidosis, renal insufficiency, mild troponin anemia and negative flu and COVID-19.  He has required Levophed for hypotension and Bair hugger for hypothermia.    Past Medical History   has a past medical history of Alcohol abuse, Arthritis, Cataract, Chronic back pain, Depression, Fractures (04/2015), and GERD (gastroesophageal reflux disease).   Significant Hospital Events   2/20 Admit to PCCM  Consults:    Procedures:  2/20 ETT >> 2/20 right femoral CVL >>  Significant Diagnostic Tests:   2/20 CT head/chest/abdomen and pelvis>> old facial bone fractures, acute 10th rib fracture, right middle lobe 1.4 cm nodule, aspiration debris in trachea and left  mainstem bronchus, left perinephric hematoma 2/21 echo:  LVEF 30%, RV with reduced EF as well.   Micro Data:  2/20 SARS-Covid-2 and influenza>> negative 2/20 blood cultures x2>>ngtd 2/20 urine: neg 2/22 resp: few staph   Antimicrobials:  Cefepime 2/20->2/23 Vancomycin 2/20->2/22 Flagyl 2/20  Interim history/subjective:  2/24: attempt to add po meds to wean continuous sedation. Replacing K 2/23: intubated and sedated. Attempting to wean sedation for sbt. Not following commands when sedation weaned but agitated and purposeful (reaching for lines and tube) 2/22: remains sedated intubated, unresponsive on sedation. Off vaso and norepi held this am. Did req increased sedation overnight, now on propofol.  2/21:He is critically ill, intubated, on high-dose Levophed and vasopressin Sedated on low-dose fentanyl drip with breakthrough agitation this morning requiring Versed push   Objective   Blood pressure 110/64, pulse (!) 51, temperature 98.8 F (37.1 C), resp. rate 16, height 6' (1.829 m), weight 90.8 kg, SpO2 100 %.    Vent Mode: PRVC FiO2 (%):  [40 %] 40 % Set Rate:  [16 bmp] 16 bmp Vt Set:  [620 mL] 620 mL PEEP:  [5 cmH20] 5 cmH20 Plateau Pressure:  [16 cmH20-26 cmH20] 23 cmH20   Intake/Output Summary (Last 24 hours) at 08/27/2019 0951 Last data filed at 08/27/2019 0900 Gross per 24 hour  Intake 1811.37 ml  Output 1925 ml  Net -113.63 ml   Filed Weights   08/23/19 2218 08/26/19 0500 08/27/19 0414  Weight: 81.2 kg 86.9 kg 90.8 kg    General: Well-built, well-nourished African-American male HEENT: MM pink/moist, pupils pinpoint and unresponsive  Neuro: RASS -2 to +2, does not follow commands nonspecific agitation, pupils pinpoint CV: s1s2 RRR, no m/r/g PULM: Oral ETT, clear bilaterally GI: soft, bsx4 active  Extremities: warm/dry, no edema  Skin: no rashes or lesions   Lab Results  Component Value Date   WBC 6.2 08/27/2019   HGB 11.7 (L) 08/27/2019   HCT 34.9 (L)  08/27/2019   MCV 99.7 08/27/2019   PLT 108 (L) 08/27/2019   This SmartLink has not been configured with any valid records.     Lab Results  Component Value Date   CREATININE 1.36 (H) 08/27/2019   BUN 14 08/27/2019   NA 144 08/27/2019   K 3.3 (L) 08/27/2019   CL 108 08/27/2019   CO2 28 08/27/2019    Resolved Hospital Problem list     Assessment & Plan:   Cardiac arrest with unclear initial rhythm, subsequent hypercapnic respiratory failure and likely cardiogenic shock -In the setting of alcohol and cocaine use, no history of IV drug abuse or overdose per patient's brother -Elevated troponin with EKG showing A. fib LBBB P: -echo with poor LV and RV function, will eventually need cards f/u if able to improve neuro status -off pressors    Acute respiratory failure Aspiration pneumonitis per CT, no new infiltrates on chest x-ray Rib fracture left 10th --Maintain full vent support with SAT/SBT as tolerated -HOB elevated 30 degrees. -Bronchial hygiene and RT/bronchodilator protocol. -off abx.  -no infiltrates on cxr (personally reviewed by me on 2/23)    Acute Encephalopathy, concern for anoxia -Hypothermic on arrival unknown downtime hence TTM deferred History of arthritis cervical fusion on chronic opioid therapy P: --eeg without seizure activity, consider neurology consult if remains unresponsive -Avoid fevers -Continue fentanyl drip with intermittent Versed and new propofol, goal RASS 0 -adding opioid scheduled as well as klonopin to help with wean of iv continuous sedation.   Nonoliguric AKI , unclear chronicity -Last baseline creatinine was normal 3 years ago -indices stable around 1.3 -f/u good rhabdomyolysis -cr stable at 1.3 -ck downtrending Hypomagnesemia:  -replace  Left perinephric hematoma -DC subcutaneous heparin and use SCDs Thrombocytopenia:  -no acute indication for transfusion -follow Normocytic anemia:  -follow, no overt bleeding noted  but does have hematoma as noted above and -hgb is stable today   lactic acidosis:  -resolved  Best practice:  Diet: tube feeds Pain/Anxiety/Delirium protocol (if indicated): per protocol VAP protocol (if indicated): Yes, HOB at 30 degrees DVT prophylaxis: Heparin GI prophylaxis: Protonix Glucose control: SSI Mobility: Bedrest Code Status: Full code Family Communication:  brother [name] via phone 2/23 Disposition: ICU   Critical care time: The patient is critically ill with multiple organ systems failure and requires high complexity decision making for assessment and support, frequent evaluation and titration of therapies, application of advanced monitoring technologies and extensive interpretation of multiple databases.  Critical care time 40 mins. This represents my time independent of the NPs time taking care of the pt. This is excluding procedures.    Briant Sites DO [facility] 08/27/2019, 9:51 AM

## 2019-08-28 ENCOUNTER — Inpatient Hospital Stay (HOSPITAL_COMMUNITY): Payer: Medicare Other

## 2019-08-28 DIAGNOSIS — R911 Solitary pulmonary nodule: Secondary | ICD-10-CM

## 2019-08-28 DIAGNOSIS — Z9911 Dependence on respirator [ventilator] status: Secondary | ICD-10-CM

## 2019-08-28 DIAGNOSIS — J15211 Pneumonia due to Methicillin susceptible Staphylococcus aureus: Secondary | ICD-10-CM

## 2019-08-28 DIAGNOSIS — S37019A Minor contusion of unspecified kidney, initial encounter: Secondary | ICD-10-CM

## 2019-08-28 DIAGNOSIS — T796XXA Traumatic ischemia of muscle, initial encounter: Secondary | ICD-10-CM

## 2019-08-28 LAB — CULTURE, RESPIRATORY W GRAM STAIN

## 2019-08-28 LAB — COMPREHENSIVE METABOLIC PANEL
ALT: 57 U/L — ABNORMAL HIGH (ref 0–44)
AST: 70 U/L — ABNORMAL HIGH (ref 15–41)
Albumin: 2.8 g/dL — ABNORMAL LOW (ref 3.5–5.0)
Alkaline Phosphatase: 68 U/L (ref 38–126)
Anion gap: 10 (ref 5–15)
BUN: 15 mg/dL (ref 8–23)
CO2: 26 mmol/L (ref 22–32)
Calcium: 8.9 mg/dL (ref 8.9–10.3)
Chloride: 105 mmol/L (ref 98–111)
Creatinine, Ser: 1.12 mg/dL (ref 0.61–1.24)
GFR calc Af Amer: >60 mL/min (ref >60)
GFR calc non Af Amer: >60 mL/min (ref >60)
Glucose, Bld: 135 mg/dL — ABNORMAL HIGH (ref 70–99)
Potassium: 3.5 mmol/L (ref 3.5–5.1)
Sodium: 141 mmol/L (ref 135–145)
Total Bilirubin: 1 mg/dL (ref 0.3–1.2)
Total Protein: 6.2 g/dL — ABNORMAL LOW (ref 6.5–8.1)

## 2019-08-28 LAB — CBC
HCT: 40.2 % (ref 39.0–52.0)
Hemoglobin: 13.5 g/dL (ref 13.0–17.0)
MCH: 33.3 pg (ref 26.0–34.0)
MCHC: 33.6 g/dL (ref 30.0–36.0)
MCV: 99.3 fL (ref 80.0–100.0)
Platelets: 123 10*3/uL — ABNORMAL LOW (ref 150–400)
RBC: 4.05 MIL/uL — ABNORMAL LOW (ref 4.22–5.81)
RDW: 12.5 % (ref 11.5–15.5)
WBC: 9.1 10*3/uL (ref 4.0–10.5)
nRBC: 0 % (ref 0.0–0.2)

## 2019-08-28 LAB — GLUCOSE, CAPILLARY
Glucose-Capillary: 117 mg/dL — ABNORMAL HIGH (ref 70–99)
Glucose-Capillary: 118 mg/dL — ABNORMAL HIGH (ref 70–99)
Glucose-Capillary: 124 mg/dL — ABNORMAL HIGH (ref 70–99)
Glucose-Capillary: 126 mg/dL — ABNORMAL HIGH (ref 70–99)
Glucose-Capillary: 126 mg/dL — ABNORMAL HIGH (ref 70–99)
Glucose-Capillary: 131 mg/dL — ABNORMAL HIGH (ref 70–99)
Glucose-Capillary: 139 mg/dL — ABNORMAL HIGH (ref 70–99)

## 2019-08-28 LAB — CULTURE, BLOOD (ROUTINE X 2)
Culture: NO GROWTH
Culture: NO GROWTH
Special Requests: ADEQUATE

## 2019-08-28 LAB — CK: Total CK: 846 U/L — ABNORMAL HIGH (ref 49–397)

## 2019-08-28 LAB — GAMMA GT: GGT: 33 U/L (ref 7–50)

## 2019-08-28 MED ORDER — FENTANYL CITRATE (PF) 100 MCG/2ML IJ SOLN: 25.0000 ug | INTRAMUSCULAR | Status: DC | PRN

## 2019-08-28 MED ORDER — SODIUM CHLORIDE 0.9 % IV SOLN
2.0000 g | Freq: Two times a day (BID) | INTRAVENOUS | Status: DC
  Administered 2019-08-28: 03:00:00 2 g via INTRAVENOUS
  Filled 2019-08-28: qty 2

## 2019-08-28 MED ORDER — SENNOSIDES-DOCUSATE SODIUM 8.6-50 MG PO TABS
1.0000 | ORAL_TABLET | Freq: Every day | ORAL | Status: DC
  Administered 2019-08-28 – 2019-09-15 (×8): 1
  Filled 2019-08-28 (×12): qty 1

## 2019-08-28 MED ORDER — VANCOMYCIN HCL 2000 MG/400ML IV SOLN
2000.0000 mg | Freq: Once | INTRAVENOUS | Status: AC
  Administered 2019-08-28: 04:00:00 2000 mg via INTRAVENOUS
  Filled 2019-08-28: qty 400

## 2019-08-28 MED ORDER — POTASSIUM CHLORIDE 20 MEQ/15ML (10%) PO SOLN
40.0000 meq | Freq: Once | ORAL | Status: AC
  Administered 2019-08-28: 40 meq
  Filled 2019-08-28: qty 30

## 2019-08-28 MED ORDER — VANCOMYCIN HCL 1750 MG/350ML IV SOLN: 1750.0000 mg | INTRAVENOUS | Status: DC

## 2019-08-28 MED ORDER — IBUPROFEN 100 MG/5ML PO SUSP
200.0000 mg | Freq: Once | ORAL | Status: AC
  Administered 2019-08-28: 03:00:00 200 mg
  Filled 2019-08-28: qty 10

## 2019-08-28 MED ORDER — FENTANYL CITRATE (PF) 100 MCG/2ML IJ SOLN
25.0000 ug | INTRAMUSCULAR | Status: DC | PRN
  Administered 2019-08-28 (×3): 100 ug via INTRAVENOUS

## 2019-08-28 MED ORDER — POLYETHYLENE GLYCOL 3350 17 G PO PACK
17.0000 g | PACK | Freq: Every day | ORAL | Status: DC
  Administered 2019-08-28 – 2019-08-29 (×2): 17 g
  Filled 2019-08-28 (×2): qty 1

## 2019-08-28 MED ORDER — SODIUM CHLORIDE 0.9 % IV SOLN
2.0000 g | INTRAVENOUS | Status: DC
  Administered 2019-08-28: 12:00:00 2 g via INTRAVENOUS
  Filled 2019-08-28: qty 20

## 2019-08-28 NOTE — Plan of Care (Signed)
Updated brother via phone.  Steffanie Dunn, DO 08/28/19 10:53 AM [facility]

## 2019-08-28 NOTE — Progress Notes (Signed)
NAME:  Gary Davis, MRN:  [id number], DOB:  December 21, 1955, LOS: 5 ADMISSION DATE:  08/23/2019, CONSULTATION DATE:  08/28/19 REFERRING MD: Reather Converse, CHIEF COMPLAINT:  Cardiac arrest   Brief History   64 y.o. M who was found unresponsive by a roommate with unknown downtime.  Last known well was 5 hours before.  EMS found patient unresponsive and pulseless, ROSC after 1 round of CPR.  Intubated and unresponsive in the ED and PCCM consulted for admission.  History of present illness   Gary Davis is a 64 year old male with past medical history of cocaine and alcohol abuse, depression and orthopedic problems who was found unresponsive by his roommate/fiance around 3 PM 08/23/2019.  Per reports, he was last seen well around 10 or 11 AM and was drinking alcohol and smoking cocaine.  He stopped breathing after fiance found him and she called EMS.  Narcan was administered without improvement, pulses were then lost and CPR was performed and he responded to 1 round without epi.  He was intubated and transported to the emergency department.  In the ED, he has not had any significant neurologic activity hours after after RSI medications.  Did not show signs of STEMI or significant ischemia,  labs were significant for lactic acid >11, respiratory acidosis, renal insufficiency, mild troponin anemia and negative flu and COVID-19.  He has required Levophed for hypotension and Bair hugger for hypothermia.    Past Medical History   has a past medical history of Alcohol abuse, Arthritis, Cataract, Chronic back pain, Depression, Fractures (04/2015), and GERD (gastroesophageal reflux disease).   Significant Hospital Events   2/20 Admit to PCCM  Consults:    Procedures:  2/20 ETT >> 2/20 right femoral CVL >>  Significant Diagnostic Tests:   2/20 CT head/chest/abdomen and pelvis>> old facial bone fractures, acute 10th rib fracture, right middle lobe 1.4 cm nodule, aspiration debris in trachea and left  mainstem bronchus, left perinephric hematoma 2/21 echo:  LVEF 30%, RV with reduced EF as well.   Micro Data:  2/20 SARS-Covid-2 and influenza>> negative 2/20 blood cultures x2>>ngtd 2/20 urine: neg 2/22 resp: MSSA 2/25 blood>> 2/25 trach aspirate: Moderate PMN, moderate GPC in pairs, few GNR and GPR  Antimicrobials:  Cefepime 2/20->2/23, 2/24 Vancomycin 2/20->2/22, 2/24 Flagyl 2/20 Ceftriaxone 2/25>>  Interim history/subjective:  2/25: Febrile overnight, antibiotics restarted and recultured.  Required straight cathing twice overnight.  No bowel movement yet. 2/24: attempt to add po meds to wean continuous sedation. Replacing K 2/23: intubated and sedated. Attempting to wean sedation for sbt. Not following commands when sedation weaned but agitated and purposeful (reaching for lines and tube) 2/22: remains sedated intubated, unresponsive on sedation. Off vaso and norepi held this am. Did req increased sedation overnight, now on propofol.  2/21:He is critically ill, intubated, on high-dose Levophed and vasopressin Sedated on low-dose fentanyl drip with breakthrough agitation this morning requiring Versed push   Objective   Blood pressure 122/71, pulse 60, temperature 98.6 F (37 C), temperature source Axillary, resp. rate 18, height 6' (1.829 m), weight 87.2 kg, SpO2 100 %.    Vent Mode: PSV;CPAP FiO2 (%):  [40 %] 40 % Set Rate:  [16 bmp] 16 bmp Vt Set:  [620 mL] 620 mL PEEP:  [5 cmH20] 5 cmH20 Pressure Support:  [12 cmH20] 12 cmH20 Plateau Pressure:  [7 cmH20-27 cmH20] 27 cmH20   Intake/Output Summary (Last 24 hours) at 08/28/2019 0946 Last data filed at 08/28/2019 0900 Gross per 24 hour  Intake 2331.7  ml  Output 2000 ml  Net 331.7 ml   Filed Weights   08/26/19 0500 08/27/19 0414 08/28/19 0500  Weight: 86.9 kg 90.8 kg 87.2 kg    General: Critically ill-appearing elderly man lying in bed no acute distress, intubated and sedated HEENT: Normocephalic, atraumatic, pupils  pinpoint.  Eyes anicteric. Neuro: RASS -4 on low-dose fentanyl and propofol.  Withdrawing minimally, spontaneously moving left arm. + gag reflex. CV: S1-S2, regular rate and rhythm, no murmurs PULM: Thin, copious tan secretions from ET tube.  Clear to auscultation bilaterally.  Breathing comfortably on CPAP plus pressure support GI: Soft, mildly distended, nontender Extremities: No clubbing, cyanosis, edema Skin: No rashes or wounds, bandage over right inguinal region from previous CVC   Lab Results  Component Value Date   WBC 9.1 08/28/2019   HGB 13.5 08/28/2019   HCT 40.2 08/28/2019   MCV 99.3 08/28/2019   PLT 123 (L) 08/28/2019   This SmartLink has not been configured with any valid records.     Lab Results  Component Value Date   CREATININE 1.12 08/28/2019   BUN 15 08/28/2019   NA 141 08/28/2019   K 3.5 08/28/2019   CL 105 08/28/2019   CO2 26 08/28/2019    Resolved Hospital Problem list     Assessment & Plan:   Cardiac arrest with unclear initial rhythm, subsequent hypercapnic respiratory failure and likely cardiogenic shock -In the setting of alcohol and cocaine use, no history of IV drug abuse or overdose per patient's brother -Elevated troponin with EKG showing A. fib LBBB P: -Continue supportive care.  Doing well off vasopressors. -Continue to monitor on telemetry -Will eventually need cardiac evaluation for HFrEF   Acute hypoxic and hypercapneic respiratory failure Aspiration pneumonitis per CT, now with aspiration pneumonia-MSSA Rib fracture left 10th -Continue low tidal volume ventilation, 4-8 cc/kg ideal body weight goal plateau less than 30 and driving pressure less than 15. -VAP prevention protocol -Daily SAT and SBT -Ceftriaxone to complete a 7-day course (2/25- 3/3) -Continue aggressive pulmonary hygiene -Continue to follow cultures drawn 2/25  Acute Encephalopathy, concern for anoxic brain injury -Hypothermic on arrival unknown downtime hence  TTM deferred History of arthritis cervical fusion on chronic opioid therapy P: -Sedation vacation today.  We will continue to assess neurologic status.  Avoiding long-acting sedating deliriogenic medications for now to facilitate neuroprognostication. -Aggressively treat fevers -Continue fentanyl and propofol as needed, goal RASS 0 to -1  Nonoliguric AKI , improving. Last baseline creatinine was normal 3 years ago Rhabdomyolysis, CK downtrending Hypomagnesemia Hypokalemia-resolved -Continue to monitor renal function -Strict I/O -Maintain euvolemia -Renally dose meds and avoid nephrotoxic medications -Continue to follow CK while on low-dose propofol  Left perinephric hematoma is covered on CT 2/20.  Chemical DVT prophylaxis has been held -Continue SCDs -Renal ultrasound to reevaluate-if stable plan to restart subcutaneous heparin DVT prophylaxis  Thrombocytopenia-improving -Continue to monitor  Normocytic anemia, improved  -Continue to monitor   lactic acidosis-resolved  Possible cholecystitis on imaging without elevated bilirubin or alk phos.  Transaminase elevation likely attributable to rhabdo. -Checking GGT -If GGT elevated will consider right upper quadrant ultrasound  RML pulmonary nodule-1.4 cm.  Concerning given emphysema on CT. -Needs outpatient evaluation  Best practice:  Diet: tube feeds Pain/Anxiety/Delirium protocol (if indicated): per protocol VAP protocol (if indicated): Yes, HOB at 30 degrees DVT prophylaxis: Heparin GI prophylaxis: Protonix Glucose control: SSI Mobility: Bedrest Code Status: Full code Family Communication:  L/M for brother Percell Miller on 2/25 Disposition: ICU  This patient is critically ill with multiple organ system failure which requires frequent high complexity decision making, assessment, support, evaluation, and titration of therapies. This was completed through the application of advanced monitoring technologies and extensive  interpretation of multiple databases. During this encounter critical care time was devoted to patient care services described in this note for 36 minutes.  Julian Hy, DO 08/28/19 10:21 AM [facility]

## 2019-08-28 NOTE — Progress Notes (Signed)
eLink Physician-Brief Progress Note Patient Name: Gary Davis DOB: 06-06-1956 MRN: [id number]   Date of Service  08/28/2019  HPI/Events of Note  Fever to 100.5 F - Tracheal aspirate culture positive for staph. Not on antibiotics. AST and ALT both elevated = no Tylenol. Creatinine = 1.36.   eICU Interventions  Will order: 1. Blood cultures X 2. 2. UA. 4. Tracheal aspirate culture.  5. Motrin liquid 200 mg per tube X 1 now. 6. Vancomycin and Cefepime per pharmacy consult.  7. Ice packs PRN.      Intervention Category Major Interventions: Other:;Infection - evaluation and management  Gary Davis 08/28/2019, 2:17 AM

## 2019-08-28 NOTE — Progress Notes (Signed)
Pharmacy Antibiotic Note  Gary Davis is a 64 y.o. male with fevers and possible PNA.  Pharmacy has been consulted for Vancomycin and Cefepime  dosing.  Plan: Vancomycin 2000 mg IV now, then 1750 mg IV q24h  Est AUC 497 Cefepime 2 g IV q12h  Height: 6' (182.9 cm) Weight: 200 lb 2.8 oz (90.8 kg) IBW/kg (Calculated) : 77.6  Temp (24hrs), Avg:99 F (37.2 C), Min:98.1 F (36.7 C), Max:100.7 F (38.2 C)  Recent Labs  Lab 08/23/19 1712 08/23/19 1713 08/23/19 1720 08/23/19 1720 08/23/19 1905 08/23/19 2310 08/24/19 0500 08/24/19 0525 08/25/19 0437 08/25/19 0940 08/26/19 0500 08/27/19 0413  WBC  --   --  12.4*  --   --   --   --  9.4 9.3  --  6.8 6.2  CREATININE   < >  --  3.64*   < >  --  2.90*  --  2.63* 1.52*  --  1.31* 1.36*  LATICACIDVEN  --  >11.0*  --   --  >11.0* 9.1* 3.8*  --   --  1.2  --   --    < > = values in this interval not displayed.    Estimated Creatinine Clearance: 59.4 mL/min (A) (by C-G formula based on SCr of 1.36 mg/dL (H)).    Allergies  Allergen Reactions  . Hydrocodone Itching and Nausea And Vomiting  . Percocet [Oxycodone-Acetaminophen] Nausea And Vomiting    Can take oxycodone without tylenol additive  . Tramadol Nausea Only   Eddie Candle 08/28/2019 2:19 AM

## 2019-08-29 LAB — COMPREHENSIVE METABOLIC PANEL
ALT: 38 U/L (ref 0–44)
AST: 39 U/L (ref 15–41)
Albumin: 2.4 g/dL — ABNORMAL LOW (ref 3.5–5.0)
Alkaline Phosphatase: 58 U/L (ref 38–126)
Anion gap: 9 (ref 5–15)
BUN: 19 mg/dL (ref 8–23)
CO2: 26 mmol/L (ref 22–32)
Calcium: 8.7 mg/dL — ABNORMAL LOW (ref 8.9–10.3)
Chloride: 103 mmol/L (ref 98–111)
Creatinine, Ser: 0.92 mg/dL (ref 0.61–1.24)
GFR calc Af Amer: >60 mL/min (ref >60)
GFR calc non Af Amer: >60 mL/min (ref >60)
Glucose, Bld: 140 mg/dL — ABNORMAL HIGH (ref 70–99)
Potassium: 3.4 mmol/L — ABNORMAL LOW (ref 3.5–5.1)
Sodium: 138 mmol/L (ref 135–145)
Total Bilirubin: 0.7 mg/dL (ref 0.3–1.2)
Total Protein: 5.6 g/dL — ABNORMAL LOW (ref 6.5–8.1)

## 2019-08-29 LAB — CBC
HCT: 36.2 % — ABNORMAL LOW (ref 39.0–52.0)
Hemoglobin: 12.3 g/dL — ABNORMAL LOW (ref 13.0–17.0)
MCH: 33.3 pg (ref 26.0–34.0)
MCHC: 34 g/dL (ref 30.0–36.0)
MCV: 98.1 fL (ref 80.0–100.0)
Platelets: 137 10*3/uL — ABNORMAL LOW (ref 150–400)
RBC: 3.69 MIL/uL — ABNORMAL LOW (ref 4.22–5.81)
RDW: 12.5 % (ref 11.5–15.5)
WBC: 8 10*3/uL (ref 4.0–10.5)
nRBC: 0 % (ref 0.0–0.2)

## 2019-08-29 LAB — GLUCOSE, CAPILLARY
Glucose-Capillary: 104 mg/dL — ABNORMAL HIGH (ref 70–99)
Glucose-Capillary: 116 mg/dL — ABNORMAL HIGH (ref 70–99)
Glucose-Capillary: 125 mg/dL — ABNORMAL HIGH (ref 70–99)
Glucose-Capillary: 128 mg/dL — ABNORMAL HIGH (ref 70–99)
Glucose-Capillary: 129 mg/dL — ABNORMAL HIGH (ref 70–99)
Glucose-Capillary: 144 mg/dL — ABNORMAL HIGH (ref 70–99)
Glucose-Capillary: 95 mg/dL (ref 70–99)

## 2019-08-29 LAB — CK: Total CK: 443 U/L — ABNORMAL HIGH (ref 49–397)

## 2019-08-29 LAB — PHOSPHORUS: Phosphorus: 2.7 mg/dL (ref 2.5–4.6)

## 2019-08-29 LAB — MAGNESIUM: Magnesium: 1.7 mg/dL (ref 1.7–2.4)

## 2019-08-29 MED ORDER — CEFAZOLIN SODIUM-DEXTROSE 2-4 GM/100ML-% IV SOLN
2.0000 g | Freq: Three times a day (TID) | INTRAVENOUS | Status: DC
  Administered 2019-08-29 – 2019-09-01 (×9): 2 g via INTRAVENOUS
  Filled 2019-08-29 (×12): qty 100

## 2019-08-29 MED ORDER — POTASSIUM CHLORIDE 20 MEQ/15ML (10%) PO SOLN
40.0000 meq | Freq: Once | ORAL | Status: AC
  Administered 2019-08-29: 40 meq
  Filled 2019-08-29: qty 30

## 2019-08-29 MED ORDER — PANTOPRAZOLE SODIUM 40 MG PO TBEC
40.0000 mg | DELAYED_RELEASE_TABLET | Freq: Every day | ORAL | Status: DC
  Administered 2019-08-29 – 2019-09-16 (×18): 40 mg via ORAL
  Filled 2019-08-29 (×22): qty 1

## 2019-08-29 MED ORDER — POLYETHYLENE GLYCOL 3350 17 G PO PACK
17.0000 g | PACK | Freq: Every day | ORAL | Status: DC
  Administered 2019-09-07 – 2019-09-11 (×3): 17 g via ORAL
  Filled 2019-08-29 (×10): qty 1

## 2019-08-29 MED ORDER — RESOURCE THICKENUP CLEAR PO POWD
ORAL | Status: DC | PRN
  Filled 2019-08-29: qty 125

## 2019-08-29 NOTE — Evaluation (Signed)
Clinical/Bedside Swallow Evaluation Patient Details  Name: Gary Davis MRN: [id number] Date of Birth: May 22, 1956  Today's Date: 08/29/2019 Time: SLP Start Time (ACUTE ONLY): 1535 SLP Stop Time (ACUTE ONLY): 1555 SLP Time Calculation (min) (ACUTE ONLY): 20 min  Past Medical History:  Past Medical History:  Diagnosis Date  . Alcohol abuse   . Arthritis    "both knees; joints in hands; back of my neck; lower back" (11/04/2015)  . Cataract   . Chronic back pain    "upper and lower" (11/04/2015)  . Depression   . Fractures 04/2015   neck fracture  . GERD (gastroesophageal reflux disease)    Past Surgical History:  Past Surgical History:  Procedure Laterality Date  . ARTHROSCOPIC REPAIR ACL Right   . BACK SURGERY    . FINGER FRACTURE SURGERY Right    4th Finger  . FRACTURE SURGERY    . JOINT REPLACEMENT    . MANDIBLE FRACTURE SURGERY  1990s  . ORBITAL FRACTURE SURGERY Right ~ 1970  . ORIF ANKLE FRACTURE Right   . POSTERIOR CERVICAL FUSION/FORAMINOTOMY N/A 02/09/2014   Procedure: POSTERIOR CERVICAL FUSION/FORAMINOTOMY LEVEL 4  Cervical three to seven decompression and fusion with lateral mass screws;  Surgeon: Mariam Dollar, MD;  Location: [facility];  Service: Neurosurgery;  Laterality: N/A;  . POSTERIOR CERVICAL FUSION/FORAMINOTOMY N/A 04/15/2015   Procedure: Posterior cervical fusion C6-T1 with instrumentation and arthrodesis, decompressive laminectomy C7-T1, Foraminotomy C8-T1;  Surgeon: Donalee Citrin, MD;  Location: [facility];  Service: Neurosurgery;  Laterality: N/A;  . TONSILLECTOMY    . TOTAL KNEE ARTHROPLASTY Right 11/04/2015  . TOTAL KNEE ARTHROPLASTY Right 11/04/2015   Procedure: RIGHT TOTAL KNEE ARTHROPLASTY;  Surgeon: Tarry Kos, MD;  Location: [facility] OR;  Service: Orthopedics;  Laterality: Right;   HPI:  64yo male admitted 08/23/19 after being found unresponsive with unknown down time. PMH: cocaine, Alcohol abuse, Arthritis, Cataract, Chronic back pain, Depression,  Fractures (04/2015), and GERD. Intubated 2/20-26/21. CXR = No acute cardiopulmonary disease.   Assessment / Plan / Recommendation Clinical Impression  Pt recently extubated after 6 days intubated. Oral motor strength and function are adequate. Some missing dentition. Pt reports slight discomfort with swallow, likely due to extended intubation. Pt voice quality is clear but hoarse. Oral care completed with suction. Pt then accepted trials of ice chips, thin liquid, nectar thick liquid, and puree textures. Cough response following thin liquid trials. Other consistencies appear to be tolerated without overt s/s aspiration. Pt exhibited multiple swallows of puree, raising concern for post-swallow residue. Will begin puree (dys 1) diet and nectar thick liquids, crushed meds. SLP will follow up at bedside to assess diet tolerance and readiness to advance. Safe swallow precautions posted at [facility]. RN and MD informed.    SLP Visit Diagnosis: Dysphagia, unspecified (R13.10)    Aspiration Risk  Mild aspiration risk;Moderate aspiration risk    Diet Recommendation Dysphagia 1 (Puree);Nectar-thick liquid   Liquid Administration via: Cup;Straw Medication Administration: Crushed with puree Compensations: Minimize environmental distractions;Slow rate;Small sips/bites Postural Changes: Seated upright at 90 degrees;Remain upright for at least 30 minutes after po intake    Other  Recommendations Oral Care Recommendations: Oral care QID Other Recommendations: Have oral suction available;Order thickener from pharmacy   Follow up Recommendations (TBD)      Frequency and Duration min 2x/week  1 week;2 weeks       Prognosis Prognosis for Safe Diet Advancement: Good      Swallow Study   General Date  of Onset: 08/23/19 HPI: 64yo male admitted 08/23/19 after being found unresponsive with unknown down time. PMH: cocaine, Alcohol abuse, Arthritis, Cataract, Chronic back pain, Depression, Fractures (04/2015), and  GERD. Intubated 2/20-26/21. CXR = No acute cardiopulmonary disease. Type of Study: Bedside Swallow Evaluation Previous Swallow Assessment: none Diet Prior to this Study: NPO Temperature Spikes Noted: No Respiratory Status: Nasal cannula History of Recent Intubation: Yes Length of Intubations (days): 6 days Date extubated: 08/29/19 Behavior/Cognition: Alert;Cooperative Oral Cavity Assessment: Within Functional Limits Oral Care Completed by SLP: Yes Oral Cavity - Dentition: Missing dentition Vision: Functional for self-feeding Patient Positioning: Upright in bed Baseline Vocal Quality: Hoarse Volitional Cough: Weak Volitional Swallow: Able to elicit    Oral/Motor/Sensory Function Overall Oral Motor/Sensory Function: Within functional limits   Ice Chips Ice chips: Within functional limits Presentation: Spoon   Thin Liquid Thin Liquid: Impaired Presentation: Straw;Spoon Pharyngeal  Phase Impairments: Cough - Immediate;Suspected delayed Swallow    Nectar Thick Nectar Thick Liquid: Within functional limits Presentation: Straw   Honey Thick Honey Thick Liquid: Not tested   Puree Puree: Impaired Presentation: Spoon Pharyngeal Phase Impairments: Decreased hyoid-laryngeal movement;Multiple swallows Other Comments: grimacing with swallow - hurts "a little"   Solid     Solid: Not tested     Lynn Sissel B. Quentin Ore, [facility], [place] Speech Language Pathologist Office: [phone] Pager: [phone]  Shonna Chock 08/29/2019,5:42 PM

## 2019-08-29 NOTE — Progress Notes (Addendum)
Patient AxOx1, accusing RN of breaking his right wrist, no swelling or pain at site. Patient continues weak attempts to get out of bed, stating he wants coffee. Patient tolerated ice chips with no s/s aspiration, although he has very weak cough with trouble clearing secretions. ST consult placed. Will continue to monitor.  1553 Patient now c/o right wrist pain. MD aware.

## 2019-08-29 NOTE — Progress Notes (Signed)
RT note: patient placed on CPAP/PSV of 8/5 at 0725.  Currently tolerating well. RT will continue to monitor.

## 2019-08-29 NOTE — Progress Notes (Signed)
eLink Physician-Brief Progress Note Patient Name: Gary Davis DOB: 03/04/1956 MRN: [id number]   Date of Service  08/29/2019  HPI/Events of Note  Recurrent urinary retention.  eICU Interventions  Foley catheter ordered.        Thomasene Lot [name] 08/29/2019, 12:24 AM

## 2019-08-29 NOTE — Procedures (Signed)
Extubation Procedure Note  Patient Details:   Name: Gary Davis DOB: 02/02/1956 MRN: [id number]   Airway Documentation:    Vent end date: 08/29/19 Vent end time: 0957   Evaluation  O2 sats: stable throughout Complications: No apparent complications Patient did tolerate procedure well. Bilateral Breath Sounds: Diminished   Yes   Patient extubated per MD order. Positive cuff leak. No stridor noted. Vitals are stable on 4L [place]. RN at bedside.  [name] 08/29/2019, 10:07 AM

## 2019-08-29 NOTE — Progress Notes (Addendum)
NAME:  Gary Davis, MRN:  [id number], DOB:  08/14/1955, LOS: 6 ADMISSION DATE:  08/23/2019, CONSULTATION DATE:  08/29/19 REFERRING MD: Reather Converse, CHIEF COMPLAINT:  Cardiac arrest   Brief History   64 y.o. M who was found unresponsive by a roommate with unknown downtime.  Last known well was 5 hours before.  EMS found patient unresponsive and pulseless, ROSC after 1 round of CPR.  Intubated and unresponsive in the ED and PCCM consulted for admission.  History of present illness   Gary Davis is a 64 year old male with past medical history of cocaine and alcohol abuse, depression and orthopedic problems who was found unresponsive by his roommate/fiance around 3 PM 08/23/2019.  Per reports, he was last seen well around 10 or 11 AM and was drinking alcohol and smoking cocaine.  He stopped breathing after fiance found him and she called EMS.  Narcan was administered without improvement, pulses were then lost and CPR was performed and he responded to 1 round without epi.  He was intubated and transported to the emergency department.  In the ED, he has not had any significant neurologic activity hours after after RSI medications.  Did not show signs of STEMI or significant ischemia,  labs were significant for lactic acid >11, respiratory acidosis, renal insufficiency, mild troponin anemia and negative flu and COVID-19.  He has required Levophed for hypotension and Bair hugger for hypothermia.    Past Medical History   has a past medical history of Alcohol abuse, Arthritis, Cataract, Chronic back pain, Depression, Fractures (04/2015), and GERD (gastroesophageal reflux disease).   Significant Hospital Events    2/25: Febrile overnight, antibiotics restarted and recultured.  Required straight cathing twice overnight.  No bowel movement yet. 2/24: attempt to add po meds to wean continuous sedation. Replacing K 2/23: intubated and sedated. Attempting to wean sedation for sbt. Not following commands  when sedation weaned but agitated and purposeful (reaching for lines and tube) 2/22: remains sedated intubated, unresponsive on sedation. Off vaso and norepi held this am. Did req increased sedation overnight, now on propofol.  2/21:He is critically ill, intubated, on high-dose Levophed and vasopressin Sedated on low-dose fentanyl drip with breakthrough agitation this morning requiring Versed push   Consults:    Procedures:  2/20 ETT >> 2/20 right femoral CVL >> 2/24  Significant Diagnostic Tests:   2/20 CT head/chest/abdomen and pelvis>> old facial bone fractures, acute 10th rib fracture, right middle lobe 1.4 cm nodule, aspiration debris in trachea and left mainstem bronchus, left perinephric hematoma 2/21 echo:  LVEF 30%, RV with reduced EF as well.  2/25 renal ultrasound >> Stable small left perinephric hematoma.  Bladder wall thickening with trabeculations, consistent with sequela of chronic bladder outlet obstruction.  Micro Data:  2/20 SARS-Covid-2 and influenza>> negative 2/20 blood cultures x2>>ngtd 2/20 urine: neg 2/22 resp: MSSA 2/25 blood>> 2/25 trach aspirate: Moderate PMN, moderate GPC in pairs, few GNR and GPR  Antimicrobials:  Cefepime 2/20->2/23, 2/24 Vancomycin 2/20->2/22, 2/24 Flagyl 2/20 Ceftriaxone 2/25>>  Interim history/subjective:   Afebrile Intubated, sedated, critically ill Moderate secretions per RT   Objective   Blood pressure 120/64, pulse 67, temperature 98.7 F (37.1 C), temperature source Oral, resp. rate 19, height 6' (1.829 m), weight 83.2 kg, SpO2 100 %.    Vent Mode: PSV;CPAP FiO2 (%):  [30 %-40 %] 40 % Set Rate:  [16 bmp] 16 bmp Vt Set:  [620 mL] 620 mL PEEP:  [5 cmH20] 5 cmH20 Pressure Support:  [5 [id number]  [place] Pressure:  [20 cmH20] 20 cmH20   Intake/Output Summary (Last 24 hours) at 08/29/2019 0817 Last data filed at 08/29/2019 0600 Gross per 24 hour  Intake 1549.03 ml  Output 1920 ml  Net -370.97 ml    Filed Weights   08/27/19 0414 08/28/19 0500 08/29/19 0500  Weight: 90.8 kg 87.2 kg 83.2 kg    General: Critically ill-appearing elderly man lying in bed no acute distress, intubated and sedated HEENT: Normocephalic, atraumatic, pupils pinpoint.  Eyes anicteric. Neuro: RASS -1, easily arousable on fentanyl drip, follows commands stick out his tongue and moves all 4 extremities, restrained both wrists CV: S1-S2, regular rate and rhythm, no murmurs PULM: Thin, copious tan secretions from ET tube.  Clear to auscultation bilaterally.  Breathing comfortably on pressure support GI: Soft, mildly distended, nontender Extremities: No clubbing, cyanosis, edema Skin: No rashes or wounds, bandage over right inguinal region from previous CVC   Lab Results  Component Value Date   WBC 8.0 08/29/2019   HGB 12.3 (L) 08/29/2019   HCT 36.2 (L) 08/29/2019   MCV 98.1 08/29/2019   PLT 137 (L) 08/29/2019   This SmartLink has not been configured with any valid records.     Lab Results  Component Value Date   CREATININE 0.92 08/29/2019   BUN 19 08/29/2019   NA 138 08/29/2019   K 3.4 (L) 08/29/2019   CL 103 08/29/2019   CO2 26 08/29/2019    Resolved Hospital Problem list     Assessment & Plan:   Cardiac arrest with unclear initial rhythm, subsequent hypercapnic respiratory failure and likely cardiogenic shock -In the setting of alcohol and cocaine use, no history of IV drug abuse or overdose per patient's brother -Elevated troponin with EKG showing A. fib LBBB P: -Telemetry, off pressors -Will eventually need cardiac evaluation for HFrEF   Acute hypoxic and hypercapneic respiratory failure Aspiration pneumonitis per CT, now with aspiration pneumonia-MSSA Rib fracture left 10th -Tolerating spontaneous breathing trials, proceed with extubation -Change antibiotics to Ancef to complete course until 3/3   Acute Encephalopathy, concern for anoxic brain injury -Hypothermic on arrival  unknown downtime hence TTM deferred History of arthritis cervical fusion P: -DC fentanyl drip for extubation -Can use as needed IV narcotics for pain if needed, pharmacy to check if he is on home opiates and will resume those to prevent withdrawal >> per pharmacy last opiate prescription filled was last year, so opiate withdrawal should not be an issue  Nonoliguric AKI , improving. Last baseline creatinine was normal 3 years ago Rhabdomyolysis, CK downtrending Hypomagnesemia Hypokalemia-resolved Chronic bladder outlet obstruction noted on ultrasound -Urinary retention likely related to intubation, will DC Foley soon -Hypokalemia will be repleted  Left perinephric hematomaon CT 2/20.  Stable on renal US 2/25 -Continue SCDs -Resume subcu heparin  Thrombocytopenia-improving -Continue to monitor  Normocytic anemia, improved  -Continue to monitor   lactic acidosis-resolved  Possible cholecystitis on imaging without elevated bilirubin or alk phos.  Transaminase elevation likely attributable to rhabdo.GGT nml -No further imaging required at present  RML pulmonary nodule-1.4 cm.  Concerning given emphysema on CT. -Needs outpatient evaluation  Summary-appears to be neurologically intact, proceed with extubation  Best practice:  Diet: tube feeds Pain/Anxiety/Delirium protocol (if indicated): per protocol VAP protocol (if indicated): Yes, HOB at 30 degrees DVT prophylaxis: Heparin GI prophylaxis: Protonix Glucose control: SSI Mobility: Bedrest Code Status: Full code Family Communication:  L/M for brother Percell Miller on 2/25 Disposition: ICU  The patient is critically  ill with multiple organ systems failure and requires high complexity decision making for assessment and support, frequent evaluation and titration of therapies, application of advanced monitoring technologies and extensive interpretation of multiple databases. Critical Care Time devoted to patient care services described in  this note independent of APP/resident  time is 35 minutes.   Kara Mead MD. [facility] Flood. [facility]  If no response to pager , please call [phone]   08/29/2019

## 2019-08-30 ENCOUNTER — Telehealth: Payer: Self-pay | Admitting: Internal Medicine

## 2019-08-30 ENCOUNTER — Inpatient Hospital Stay (HOSPITAL_COMMUNITY): Payer: Medicare Other

## 2019-08-30 LAB — MAGNESIUM: Magnesium: 1.8 mg/dL (ref 1.7–2.4)

## 2019-08-30 LAB — COMPREHENSIVE METABOLIC PANEL
ALT: 30 U/L (ref 0–44)
AST: 33 U/L (ref 15–41)
Albumin: 2.3 g/dL — ABNORMAL LOW (ref 3.5–5.0)
Alkaline Phosphatase: 52 U/L (ref 38–126)
Anion gap: 9 (ref 5–15)
BUN: 17 mg/dL (ref 8–23)
CO2: 27 mmol/L (ref 22–32)
Calcium: 8.5 mg/dL — ABNORMAL LOW (ref 8.9–10.3)
Chloride: 106 mmol/L (ref 98–111)
Creatinine, Ser: 1.08 mg/dL (ref 0.61–1.24)
GFR calc Af Amer: >60 mL/min (ref >60)
GFR calc non Af Amer: >60 mL/min (ref >60)
Glucose, Bld: 113 mg/dL — ABNORMAL HIGH (ref 70–99)
Potassium: 3.3 mmol/L — ABNORMAL LOW (ref 3.5–5.1)
Sodium: 142 mmol/L (ref 135–145)
Total Bilirubin: 0.8 mg/dL (ref 0.3–1.2)
Total Protein: 5.5 g/dL — ABNORMAL LOW (ref 6.5–8.1)

## 2019-08-30 LAB — GLUCOSE, CAPILLARY
Glucose-Capillary: 100 mg/dL — ABNORMAL HIGH (ref 70–99)
Glucose-Capillary: 116 mg/dL — ABNORMAL HIGH (ref 70–99)
Glucose-Capillary: 122 mg/dL — ABNORMAL HIGH (ref 70–99)
Glucose-Capillary: 98 mg/dL (ref 70–99)
Glucose-Capillary: 141 mg/dL — ABNORMAL HIGH (ref 70–99)
Glucose-Capillary: 109 mg/dL — ABNORMAL HIGH (ref 70–99)

## 2019-08-30 LAB — PHOSPHORUS: Phosphorus: 2.3 mg/dL — ABNORMAL LOW (ref 2.5–4.6)

## 2019-08-30 MED ORDER — POTASSIUM CHLORIDE CRYS ER 20 MEQ PO TBCR
40.0000 meq | EXTENDED_RELEASE_TABLET | Freq: Once | ORAL | Status: AC
  Administered 2019-08-30: 08:00:00 40 meq via ORAL
  Filled 2019-08-30: qty 2

## 2019-08-30 MED ORDER — ORAL CARE MOUTH RINSE: 15.0000 mL | Freq: Two times a day (BID) | OROMUCOSAL | Status: DC

## 2019-08-30 MED ORDER — HEPARIN SODIUM (PORCINE) 5000 UNIT/ML IJ SOLN
5000.0000 [IU] | Freq: Three times a day (TID) | INTRAMUSCULAR | Status: DC
  Administered 2019-08-30 – 2019-09-07 (×24): 5000 [IU] via SUBCUTANEOUS
  Filled 2019-08-30 (×23): qty 1

## 2019-08-30 MED ORDER — CHLORHEXIDINE GLUCONATE 0.12 % MT SOLN: 15.0000 mL | Freq: Two times a day (BID) | OROMUCOSAL | Status: DC

## 2019-08-30 MED ORDER — K PHOS MONO-SOD PHOS DI & MONO 155-852-130 MG PO TABS
500.0000 mg | ORAL_TABLET | Freq: Three times a day (TID) | ORAL | Status: AC
  Administered 2019-08-30 (×3): 500 mg via ORAL
  Filled 2019-08-30 (×3): qty 2
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
## 2019-08-30 NOTE — Progress Notes (Signed)
  Speech Language Pathology Treatment: Dysphagia  Patient Details Name: Gary Davis MRN: [id number] DOB: 1956-03-15 Today's Date: 08/30/2019 Time: 4536-4680 SLP Time Calculation (min) (ACUTE ONLY): 17 min  Assessment / Plan / Recommendation Clinical Impression  Pt seen for skilled dysphagia treatment with min verbal/visual cues provided for intake of various consistencies including: ice chips, thin and nectar thick liquids via tsp/cup with delayed cough noted after intake of thin via cup with brief oral holding noted prior to initiating swallow; pt stated his "throat felt better" today, so improvement noted with odynophagia from prolonged intubation.  Continue current diet of Dysphagia 1 (puree)/Nectar-thickened liquids with potential for upgrade in near future if pt continues to progress with swallow function.  ST will continue efforts in acute setting.  HPI HPI: 64yo male admitted 08/23/19 after being found unresponsive with unknown down time. PMH: cocaine, Alcohol abuse, Arthritis, Cataract, Chronic back pain, Depression, Fractures (04/2015), and GERD. Intubated 2/20-26/21. CXR = No acute cardiopulmonary disease.      SLP Plan  Continue with current plan of care       Recommendations  Diet recommendations: Dysphagia 1 (puree);Nectar-thick liquid Liquids provided via: Cup;No straw Medication Administration: Crushed with puree Supervision: Patient able to self feed;Full supervision/cueing for compensatory strategies;Staff to assist with self feeding Compensations: Minimize environmental distractions;Slow rate;Small sips/bites Postural Changes and/or Swallow Maneuvers: Seated upright 90 degrees                Oral Care Recommendations: Oral care QID Follow up Recommendations: Other (comment)(TBD) SLP Visit Diagnosis: Dysphagia, unspecified (R13.10) Plan: Continue with current plan of care                      Tressie Stalker, M.S., CCC-SLP 08/30/2019, 5:10 PM

## 2019-08-30 NOTE — Evaluation (Signed)
Physical Therapy Evaluation Patient Details Name: Gary Davis MRN: [id number] DOB: 1956/06/13 Today's Date: 08/30/2019   History of Present Illness  AD GUTTMAN is a 64 y.o. male with a hx of cocaine and alcohol abuse, arthritis, chronic back pain, depression and GERD who is being seen today for the evaluation of elevated troponin and atrial fibrillation status post cardiac arrest at the request of Dr. Renford Dills.  Extubated 2/26.    Clinical Impression  Pt admitted with above diagnosis. Pt was able to stand and pivot with mod assist and cues to recliner.  Pt somewhat unsteady on his feet with weakness bil LES noted and right Ue not used in functional movement as well.  Will most likely need SNF stay as pt lived alone PTA and brother is not available 24 hours per pt.  Pt currently with functional limitations due to the deficits listed below (see PT Problem List). Pt will benefit from skilled PT to increase their independence and safety with mobility to allow discharge to the venue listed below.    Follow Up Recommendations SNF;Supervision/Assistance - 24 hour    Equipment Recommendations  Other (comment)(TBA)    Recommendations for Other Services       Precautions / Restrictions Precautions Precautions: Fall Restrictions Weight Bearing Restrictions: No      Mobility  Bed Mobility Overal bed mobility: Needs Assistance Bed Mobility: Supine to Sit     Supine to sit: Mod assist     General bed mobility comments:  assist and cues to come to EOB  Transfers Overall transfer level: Needs assistance Equipment used: 2 person hand held assist Transfers: Sit to/from Stand;Stand Pivot Transfers Sit to Stand: Mod assist;From elevated surface Stand pivot transfers: Min assist;From elevated surface       General transfer comment: Pt stood with mod assist to power up with cues for hand placement with pt reaching for PT support. Once up, was min assist and cues to take pivotal  steps to recliner with pt reaching for the armrest and sitting a little before his buttocks was centered over chair.    Ambulation/Gait                Stairs            Wheelchair Mobility    Modified Rankin (Stroke Patients Only)       Balance Overall balance assessment: Needs assistance Sitting-balance support: No upper extremity supported;Feet supported Sitting balance-Leahy Scale: Fair     Standing balance support: Bilateral upper extremity supported;During functional activity Standing balance-Leahy Scale: Poor Standing balance comment: needed at least 1 Ue support for balance                             Pertinent Vitals/Pain Pain Assessment: 0-10 Pain Score: 8  Pain Location: right wrist Pain Descriptors / Indicators: Aching Pain Intervention(s): Limited activity within patient's tolerance;Monitored during session;Repositioned    Home Living Family/patient expects to be discharged to:: Private residence Living Arrangements: Alone Available Help at Discharge: Family;Available 24 hours/day(brother) Type of Home: Apartment Home Access: Level entry     Home Layout: One level Home Equipment: Cane - single point;Tub bench      Prior Function Level of Independence: Independent               Hand Dominance   Dominant Hand: Right    Extremity/Trunk Assessment   Upper Extremity Assessment Upper Extremity Assessment: Defer to OT evaluation  Lower Extremity Assessment Lower Extremity Assessment: Generalized weakness;RLE deficits/detail;LLE deficits/detail RLE Coordination: decreased gross motor LLE Coordination: decreased gross motor    Cervical / Trunk Assessment Cervical / Trunk Assessment: Kyphotic  Communication   Communication: No difficulties  Cognition Arousal/Alertness: Awake/alert Behavior During Therapy: Anxious Overall Cognitive Status: Impaired/Different from baseline Area of Impairment: Orientation;Following  commands;Safety/judgement;Awareness;Problem solving                 Orientation Level: Disoriented to;Situation;Place     Following Commands: Follows one step commands inconsistently;Follows one step commands with increased time Safety/Judgement: Decreased awareness of safety;Decreased awareness of deficits   Problem Solving: Slow processing;Decreased initiation;Difficulty sequencing;Requires verbal cues;Requires tactile cues        General Comments General comments (skin integrity, edema, etc.): VSS on 2LO2.  Pt would not functionally use his right UE and kept it externally rotated - states it got hurt when restrained.  Pt did not use the left one much either but it appears functional.     Exercises     Assessment/Plan    PT Assessment Patient needs continued PT services  PT Problem List Decreased balance;Decreased mobility;Decreased activity tolerance;Decreased coordination;Decreased knowledge of use of DME;Decreased safety awareness;Decreased knowledge of precautions       PT Treatment Interventions DME instruction;Gait training;Functional mobility training;Therapeutic activities;Therapeutic exercise;Balance training;Patient/family education    PT Goals (Current goals can be found in the Care Plan section)  Acute Rehab PT Goals Patient Stated Goal: to get out of hospital PT Goal Formulation: With patient Time For Goal Achievement: 09/13/19 Potential to Achieve Goals: Good    Frequency Min 2X/week   Barriers to discharge Decreased caregiver support      Co-evaluation               AM-PAC PT "6 Clicks" Mobility  Outcome Measure Help needed turning from your back to your side while in a flat bed without using bedrails?: A Lot Help needed moving from lying on your back to sitting on the side of a flat bed without using bedrails?: A Lot Help needed moving to and from a bed to a chair (including a wheelchair)?: A Lot Help needed standing up from a chair using  your arms (e.g., wheelchair or bedside chair)?: A Lot Help needed to walk in hospital room?: Total Help needed climbing 3-5 steps with a railing? : Total 6 Click Score: 10    End of Session Equipment Utilized During Treatment: Gait belt;Oxygen Activity Tolerance: Patient limited by fatigue Patient left: in chair;with call bell/phone within reach;with chair alarm set Nurse Communication: Mobility status PT Visit Diagnosis: Unsteadiness on feet (R26.81);Muscle weakness (generalized) (M62.81)    Time: 0900-0930 PT Time Calculation (min) (ACUTE ONLY): 30 min   Charges:   PT Evaluation $PT Eval Moderate Complexity: 1 Mod PT Treatments $Therapeutic Activity: 8-22 mins        Hollis Oh W,PT Acute Rehabilitation Services Pager:  [phone]  Office:  [place] 08/30/2019, 12:55 PM

## 2019-08-30 NOTE — Progress Notes (Signed)
PROGRESS NOTE    Gary Davis  IRW:[id number] DOB: 11/23/1955 DOA: 08/23/2019 PCP: Medicine, [facility]   Brief Narrative:  Patient is a 64 year old male with history of cocaine/alcohol abuse, depression who was found to be unresponsive by his roommate with unknown downtime.  Last known well was 5 hours before.  EMS found patient unresponsive, pulseless, ROSC was after 1 round of CPR.  Unresponsive and intubated in the emergency department.  PCCM consulted for admission.  He did not show any signs of a STEMI or significant ischemia.  Lactic acid was elevated with respiratory status, renal insufficiency.  Negative for flu and Covid test 19.  He required Levophed for hypotension and by bair hugger for hypothermia.  He was extubated on 08/2619.  Patient transferred to [facility] service on 08/30/2019. Waiting  for PT/OT evaluation.  Cardiology also consulted today due to reduced ejection fraction.  Assessment & Plan:   Active Problems:   Acute respiratory failure (HCC)   Cardiac arrest: In the setting of alcohol/cocaine use.  No history of IV drug use or overdose.  Elevated troponin with EKG showing paroxysmal A. fib, LBBB.  Currently in normal sinus rhythm.  Systolic heart failure: Echocardiogram has shown ejection fraction of 30%, moderate to severely decreased left ventricular function, global hypokinesis.  Cardiology following.  Plan for nuclear stress test.  Currently he is euvolemic  Acute hypoxic/hypercapnic respiratory failure: Aspiration pneumonitis as per CT.  Now with aspiration pneumonia showing MSSA in sputum.  Has left 10th rib fracture. Antibiotics changed to Ancef to complete the course until 3/3  Acute encephalopathy: Concern for anoxic brain injury.  Hypothermic on arrival unknown downtime.  Continue to monitor mental status.  Frequent orientation.  Currently he is alert and oriented  Nonoliguric AKI: Baseline creatinine normal about 3 years ago.  Also had  rhabdomyolysis on presentation.  Kidney function has improved and is at baseline. Chronic bladder outlet obstruction noted on ultrasound.  Plan to DC Foley.  Hypokalemia/hypophosphatemia: Being supplemented  Left perinephric hematoma: As per CT on 2/20.  Stable on renal ultrasound as per 2/25.  Thrombocytopenia: Improving  Normocytic anemia: Currently H&H stable  Lactic acidosis: Resolved  Suspected cholecystitis: On imaging without elevated bilirubin and alkaline phosphatase.  Transaminase elevation likely attributable to rhabdomyolysis.  GGT normal no further imaging stable at this time.  Denies any abdominal pain.  Abdomen nontender  History of arthritis/cervical fusion: Continue pain management and supportive care  Right middle lobe pulmonary nodule: 1.  4 cm.  Needs outpatient pulmonary evaluation  Right wrist pain/deformity: Patient reported that it happened during this hospitalization.  I will check x-ray of his right wrist    Nutrition Problem: Inadequate oral intake Etiology: acute illness, inability to eat      DVT prophylaxis:Heparin [place] Code Status: Full Family Communication: None Disposition Plan: Patient is from home.  Needs PT/OT evaluation.  Undergoing nuclear stress test.  Not ready for discharge   Consultants: Pulmonology, cardiology  Procedures: intubation  Antimicrobials:  Anti-infectives (From admission, onward)   Start     Dose/Rate Route Frequency Ordered Stop   08/29/19 1200  ceFAZolin (ANCEF) IVPB 2g/100 mL premix     2 g 200 mL/hr over 30 Minutes Intravenous Every 8 hours 08/29/19 0826 09/04/19 0559   08/29/19 0600  vancomycin (VANCOREADY) IVPB 1750 mg/350 mL  Status:  Discontinued     1,750 mg 175 mL/hr over 120 Minutes Intravenous Every 24 hours 08/28/19 0224 08/28/19 0949   08/28/19 1000  cefTRIAXone (ROCEPHIN) 2 g in sodium chloride 0.9 % 100 mL IVPB  Status:  Discontinued     2 g 200 mL/hr over 30 Minutes Intravenous Every 24 hours  08/28/19 0949 08/29/19 0826   08/28/19 0315  vancomycin (VANCOREADY) IVPB 2000 mg/400 mL     2,000 mg 200 mL/hr over 120 Minutes Intravenous  Once 08/28/19 0224 08/28/19 0541   08/28/19 0300  ceFEPIme (MAXIPIME) 2 g in sodium chloride 0.9 % 100 mL IVPB  Status:  Discontinued     2 g 200 mL/hr over 30 Minutes Intravenous 2 times daily 08/28/19 0224 08/28/19 0949   08/26/19 2000  vancomycin (VANCOREADY) IVPB 1500 mg/300 mL  Status:  Discontinued     1,500 mg 150 mL/hr over 120 Minutes Intravenous Every 48 hours 08/24/19 1310 08/26/19 0823   08/24/19 1900  ceFEPIme (MAXIPIME) 2 g in sodium chloride 0.9 % 100 mL IVPB  Status:  Discontinued     2 g 200 mL/hr over 30 Minutes Intravenous Every 24 hours 08/23/19 1903 08/24/19 1310   08/24/19 1315  ceFEPIme (MAXIPIME) 2 g in sodium chloride 0.9 % 100 mL IVPB  Status:  Discontinued     2 g 200 mL/hr over 30 Minutes Intravenous Every 12 hours 08/24/19 1310 08/26/19 0940   08/23/19 1853  vancomycin variable dose per unstable renal function (pharmacist dosing)  Status:  Discontinued      Does not apply See admin instructions 08/23/19 1854 08/24/19 1310   08/23/19 1845  ceFEPIme (MAXIPIME) 2 g in sodium chloride 0.9 % 100 mL IVPB     2 g 200 mL/hr over 30 Minutes Intravenous  Once 08/23/19 1840 08/23/19 2047   08/23/19 1845  metroNIDAZOLE (FLAGYL) IVPB 500 mg     500 mg 100 mL/hr over 60 Minutes Intravenous  Once 08/23/19 1840 08/23/19 2047   08/23/19 1845  vancomycin (VANCOREADY) IVPB 1500 mg/300 mL     1,500 mg 150 mL/hr over 120 Minutes Intravenous  Once 08/23/19 1840 08/23/19 2150      Subjective:  Patient seen and examined at the bedside this morning.  Currently hemodynamically stable.  Sitting in the chair today.  Comfortable but weak.  Currently on 2 L of oxygen per minute.  Objective: Vitals:   08/30/19 0300 08/30/19 0400 08/30/19 0500 08/30/19 0600  BP: 131/71 132/73 134/70 136/72  Pulse: 65 69 60 62  Resp: (!) 25 18 (!) 23 (!) 23   Temp: 99.8 F (37.7 C)     TempSrc: Oral     SpO2: 98% 98% 99% 99%  Weight:   82.2 kg   Height:        Intake/Output Summary (Last 24 hours) at 08/30/2019 0757 Last data filed at 08/30/2019 0600 Gross per 24 hour  Intake 743.22 ml  Output 2145 ml  Net -1401.78 ml   Filed Weights   08/28/19 0500 08/29/19 0500 08/30/19 0500  Weight: 87.2 kg 83.2 kg 82.2 kg    Examination:  General exam: Chronically ill looking, generalized weakness Respiratory system: Bilateral diminished air sounds on the bases, no wheezes or crackles  Cardiovascular system: S1 & S2 heard, RRR. No JVD, murmurs, rubs, gallops or clicks. No pedal edema. Gastrointestinal system: Abdomen is nondistended, soft and nontender. No organomegaly or masses felt. Normal bowel sounds heard. Central nervous system: Alert and oriented. No focal neurological deficits. Extremities: No edema, no clubbing ,no cyanosis Skin: No rashes, lesions or ulcers,no icterus ,no pallor   Data Reviewed: I have personally reviewed following  labs and imaging studies  CBC: Recent Labs  Lab 08/23/19 1720 08/23/19 1800 08/25/19 0437 08/26/19 0500 08/27/19 0413 08/28/19 0302 08/29/19 0502  WBC 12.4*   < > 9.3 6.8 6.2 9.1 8.0  NEUTROABS 8.7*  --   --   --   --   --   --   HGB 15.1   < > 11.6* 11.4* 11.7* 13.5 12.3*  HCT 50.5   < > 34.1* 33.8* 34.9* 40.2 36.2*  MCV 112.5*   < > 99.1 100.3* 99.7 99.3 98.1  PLT 214   < > 126* 103* 108* 123* 137*   < > = values in this interval not displayed.   Basic Metabolic Panel: Recent Labs  Lab 08/25/19 0437 08/25/19 1700 08/26/19 0500 08/26/19 1740 08/27/19 0413 08/28/19 0302 08/29/19 0502 08/30/19 0312  NA   < >  --  141  --  144 141 138 142  K   < >  --  3.4*  --  3.3* 3.5 3.4* 3.3*  CL   < >  --  106  --  108 105 103 106  CO2   < >  --  26  --  28 26 26 27   GLUCOSE   < >  --  121*  --  134* 135* 140* 113*  BUN   < >  --  17  --  14 15 19 17   CREATININE   < >  --  1.31*  --  1.36*  1.12 0.92 1.08  CALCIUM   < >  --  8.5*  --  8.6* 8.9 8.7* 8.5*  MG  --  1.9 1.8 1.9  --   --  1.7 1.8  PHOS  --  1.9* 2.4* 2.2*  --   --  2.7 2.3*   < > = values in this interval not displayed.   GFR: Estimated Creatinine Clearance: 74.8 mL/min (by C-G formula based on SCr of 1.08 mg/dL). Liver Function Tests: Recent Labs  Lab 08/26/19 0500 08/27/19 0413 08/28/19 0302 08/29/19 0502 08/30/19 0312  AST 133* 94* 70* 39 33  ALT 81* 66* 57* 38 30  ALKPHOS 58 58 68 58 52  BILITOT 0.8 1.1 1.0 0.7 0.8  PROT 5.2* 5.5* 6.2* 5.6* 5.5*  ALBUMIN 2.7* 2.6* 2.8* 2.4* 2.3*   No results for input(s): LIPASE, AMYLASE in the last 168 hours. No results for input(s): AMMONIA in the last 168 hours. Coagulation Profile: Recent Labs  Lab 08/23/19 1720  INR 1.4*   Cardiac Enzymes: Recent Labs  Lab 08/24/19 0525 08/26/19 0500 08/27/19 0413 08/28/19 0302 08/29/19 0502  CKTOTAL 4,068* 2,310* 1,438* 846* 443*   BNP (last 3 results) No results for input(s): PROBNP in the last 8760 hours. HbA1C: No results for input(s): HGBA1C in the last 72 hours. CBG: Recent Labs  Lab 08/29/19 1149 08/29/19 1608 08/29/19 1931 08/29/19 2346 08/30/19 0348  GLUCAP 95 128* 144* 104* 100*   Lipid Profile: No results for input(s): CHOL, HDL, LDLCALC, TRIG, CHOLHDL, LDLDIRECT in the last 72 hours. Thyroid Function Tests: No results for input(s): TSH, T4TOTAL, FREET4, T3FREE, THYROIDAB in the last 72 hours. Anemia Panel: No results for input(s): VITAMINB12, FOLATE, FERRITIN, TIBC, IRON, RETICCTPCT in the last 72 hours. Sepsis Labs: Recent Labs  Lab 08/23/19 1905 08/23/19 2310 08/24/19 0500 08/25/19 0940  LATICACIDVEN >11.0* 9.1* 3.8* 1.2    Recent Results (from the past 240 hour(s))  Respiratory Panel by RT PCR (Flu A&B, Covid) - Nasopharyngeal Swab  Status: None   Collection Time: 08/23/19  5:26 PM   Specimen: Nasopharyngeal Swab  Result Value Ref Range Status   SARS Coronavirus 2 by RT  PCR NEGATIVE NEGATIVE Final    Comment: (NOTE) SARS-CoV-2 target nucleic acids are NOT DETECTED. The SARS-CoV-2 RNA is generally detectable in upper respiratoy specimens during the acute phase of infection. The lowest concentration of SARS-CoV-2 viral copies this assay can detect is 131 copies/mL. A negative result does not preclude SARS-Cov-2 infection and should not be used as the sole basis for treatment or other patient management decisions. A negative result may occur with  improper specimen collection/handling, submission of specimen other than nasopharyngeal swab, presence of viral mutation(s) within the areas targeted by this assay, and inadequate number of viral copies (<131 copies/mL). A negative result must be combined with clinical observations, patient history, and epidemiological information. The expected result is Negative. Fact Sheet for Patients:  https://www.moore.com/https://www.fda.gov/media/142436/download Fact Sheet for Healthcare Providers:  https://www.young.biz/https://www.fda.gov/media/142435/download This test is not yet ap proved or cleared by the [place] FDA and  has been authorized for detection and/or diagnosis of SARS-CoV-2 by FDA under an Emergency Use Authorization (EUA). This EUA will remain  in effect (meaning this test can be used) for the duration of the COVID-19 declaration under Section 564(b)(1) of the Act, 21 U.S.C. section 360bbb-3(b)(1), unless the authorization is terminated or revoked sooner.    Influenza A by PCR NEGATIVE NEGATIVE Final   Influenza B by PCR NEGATIVE NEGATIVE Final    Comment: (NOTE) The Xpert Xpress SARS-CoV-2/FLU/RSV assay is intended as an aid in  the diagnosis of influenza from Nasopharyngeal swab specimens and  should not be used as a sole basis for treatment. Nasal washings and  aspirates are unacceptable for Xpert Xpress SARS-CoV-2/FLU/RSV  testing. Fact Sheet for Patients: https://www.moore.com/https://www.fda.gov/media/142436/download Fact Sheet for Healthcare  Providers: https://www.young.biz/https://www.fda.gov/media/142435/download This test is not yet approved or cleared by the [place] FDA and  has been authorized for detection and/or diagnosis of SARS-CoV-2 by  FDA under an Emergency Use Authorization (EUA). This EUA will remain  in effect (meaning this test can be used) for the duration of the  Covid-19 declaration under Section 564(b)(1) of the Act, 21  U.S.C. section 360bbb-3(b)(1), unless the authorization is  terminated or revoked. Performed at [facility], [address] [place], [place] [address]   Urine culture     Status: None   Collection Time: 08/23/19  5:26 PM   Specimen: Urine, Catheterized  Result Value Ref Range Status   Specimen Description URINE, CATHETERIZED  Final   Special Requests NONE  Final   Culture   Final    NO GROWTH Performed at [facility], [address] [place], [place] [address]    Report Status 08/25/2019 FINAL  Final  Blood Culture (routine x 2)     Status: None   Collection Time: 08/23/19  6:53 PM   Specimen: BLOOD  Result Value Ref Range Status   Specimen Description BLOOD RIGHT ARM  Final   Special Requests   Final    BOTTLES DRAWN AEROBIC AND ANAEROBIC Blood Culture adequate volume Performed at [facility], [address] [place], [place] [address]    Culture NO GROWTH 5 DAYS  Final   Report Status 08/28/2019 FINAL  Final  Blood Culture (routine x 2)     Status: None   Collection Time: 08/23/19  7:04 PM   Specimen: BLOOD  Result Value Ref Range  Status   Specimen Description BLOOD RIGHT HAND  Final   Special Requests   Final    BOTTLES DRAWN AEROBIC AND ANAEROBIC Blood Culture results may not be optimal due to an inadequate volume of blood received in culture bottles Performed at [facility], [address] [place], [place] [address]    Culture NO GROWTH 5 DAYS  Final   Report Status 08/28/2019 FINAL  Final  MRSA PCR Screening     Status: None   Collection Time: 08/24/19   5:02 AM   Specimen: Nasal Mucosa; Nasopharyngeal  Result Value Ref Range Status   MRSA by PCR NEGATIVE NEGATIVE Final    Comment:        The GeneXpert MRSA Assay (FDA approved for NASAL specimens only), is one component of a comprehensive MRSA colonization surveillance program. It is not intended to diagnose MRSA infection nor to guide or monitor treatment for MRSA infections. Performed at [facility], [address], [place], [place] [address]   Culture, respiratory     Status: None   Collection Time: 08/25/19  9:40 AM   Specimen: Tracheal Aspirate; Respiratory  Result Value Ref Range Status   Specimen Description TRACHEAL ASPIRATE  Final   Special Requests NONE  Final   Gram Stain   Final    MODERATE WBC PRESENT,BOTH PMN AND MONONUCLEAR RARE GRAM POSITIVE COCCI IN PAIRS Performed at [facility], [address] [place], [place] [address]    Culture FEW STAPHYLOCOCCUS AUREUS  Final   Report Status 08/28/2019 FINAL  Final   Organism ID, Bacteria STAPHYLOCOCCUS AUREUS  Final      Susceptibility   Staphylococcus aureus - MIC*    CIPROFLOXACIN <=0.5 SENSITIVE Sensitive     ERYTHROMYCIN <=0.25 SENSITIVE Sensitive     GENTAMICIN <=0.5 SENSITIVE Sensitive     OXACILLIN 0.5 SENSITIVE Sensitive     TETRACYCLINE <=1 SENSITIVE Sensitive     VANCOMYCIN <=0.5 SENSITIVE Sensitive     TRIMETH/SULFA <=10 SENSITIVE Sensitive     CLINDAMYCIN <=0.25 SENSITIVE Sensitive     RIFAMPIN <=0.5 SENSITIVE Sensitive     Inducible Clindamycin NEGATIVE Sensitive     * FEW STAPHYLOCOCCUS AUREUS  Culture, respiratory (non-expectorated)     Status: None (Preliminary result)   Collection Time: 08/28/19  2:37 AM   Specimen: Tracheal Aspirate; Respiratory  Result Value Ref Range Status   Specimen Description TRACHEAL ASPIRATE  Final   Special Requests Normal  Final   Gram Stain   Final    MODERATE WBC PRESENT, PREDOMINANTLY PMN MODERATE GRAM POSITIVE COCCI IN PAIRS IN CHAINS FEW GRAM  NEGATIVE RODS FEW GRAM POSITIVE RODS    Culture   Final    FEW PROTEUS MIRABILIS SUSCEPTIBILITIES TO FOLLOW CULTURE REINCUBATED FOR BETTER GROWTH Performed at [facility], [address], [place], [place] [address]    Report Status PENDING  Incomplete  Culture, blood (Routine X 2) w Reflex to ID Panel     Status: None (Preliminary result)   Collection Time: 08/28/19  2:52 AM   Specimen: BLOOD  Result Value Ref Range Status   Specimen Description BLOOD LEFT HAND  Final   Special Requests   Final    BOTTLES DRAWN AEROBIC AND ANAEROBIC Blood Culture results may not be optimal due to an inadequate volume of blood received in culture bottles   Culture   Final    NO GROWTH 2 DAYS Performed at [facility], [address], [place], [place] [address]    Report Status PENDING  Incomplete  Culture, blood (Routine X 2) w Reflex to ID Panel     Status: None (Preliminary result)   Collection Time: 08/28/19  3:02 AM   Specimen: BLOOD  Result Value Ref Range Status   Specimen Description BLOOD LEFT WRIST  Final   Special Requests   Final    BOTTLES DRAWN AEROBIC AND ANAEROBIC Blood Culture adequate volume   Culture   Final    NO GROWTH 2 DAYS Performed at [facility], [address], [place], [place] [address]    Report Status PENDING  Incomplete         Radiology Studies: US RENAL  Result Date: 08/28/2019 CLINICAL DATA:  Perinephric hematoma EXAM: RENAL / URINARY TRACT ULTRASOUND COMPLETE COMPARISON:  08/23/2019 FINDINGS: Right Kidney: Renal measurements: 12.4 x 4.6 x 5.1 cm = volume: 152 mL . Echogenicity within normal limits. No mass or hydronephrosis visualized. Left Kidney: Renal measurements: 12.1 x 4.5 by 3.7 cm = volume: 106 mL. Echotexture is within normal limits. No hydronephrosis. There is a 2.0 x 0.9 by 1.2 cm fluid collection in the left perinephric space consistent with the perinephric hematoma seen previously. Overall, allowing for differences in  modality, no appreciable change. Bladder: Likely diffuse bladder wall thickening sequela of chronic bladder outlet and trabeculation obstruction. Other: None IMPRESSION: 1. Stable small left perinephric hematoma. 2. Bladder wall thickening with trabeculations, consistent with sequela of chronic bladder outlet obstruction. Electronically Signed   By: Sharlet Salina M.D.   On: 08/28/2019 21:19   DG Abd Portable 1V  Result Date: 08/28/2019 CLINICAL DATA:  OG tube placement EXAM: PORTABLE ABDOMEN - 1 VIEW COMPARISON:  None. FINDINGS: Mild airspace disease at the bases. Esophageal tube tip overlies the gastroduodenal junction. Visible upper gas pattern is unremarkable. IMPRESSION: Esophageal tube tip overlies the gastroduodenal junction Electronically Signed   By: Jasmine Pang M.D.   On: 08/28/2019 23:25        Scheduled Meds: . chlorhexidine  15 mL Mouth Rinse BID  . Chlorhexidine Gluconate Cloth  6 each Topical Daily  . mouth rinse  15 mL Mouth Rinse q12n4p  . pantoprazole  40 mg Oral QHS  . polyethylene glycol  17 g Oral Daily  . potassium chloride  40 mEq Oral Once  . potassium phosphate (monobasic)  500 mg Oral TID WC  . senna-docusate  1 tablet Per Tube Daily   Continuous Infusions: . sodium chloride Stopped (08/30/19 0536)  . sodium chloride    .  ceFAZolin (ANCEF) IV 200 mL/hr at 08/30/19 0600  . sodium chloride       LOS: 7 days    Time spent: 35 mins,More than 50% of that time was spent in counseling and/or coordination of care.      Burnadette Pop, MD [facility] P2/27/2021, 7:57 AM

## 2019-08-30 NOTE — Telephone Encounter (Signed)
Triage  Patient was seen by Dr. Vassie Loll yesterday in the hospital.  Patient has a 1.4 cm nodule.  He has emphysema.  Please give him a follow-up with Dr. Vassie Loll in 3-4 weeks post hospital follow-up.  Thanks    SIGNATURE    Dr. Kalman Shan, M.D., F.C.C.P,  Pulmonary and Critical Care Medicine Staff Physician, [facility] Center Director - Interstitial Lung Disease  Program  Pulmonary Fibrosis [facility] [place], [place], [address]  Pager: [phone], If no answer or between  15:00h - 7:00h: call [phone] Telephone: [phone]  9:21 AM 08/30/2019

## 2019-08-31 ENCOUNTER — Inpatient Hospital Stay (HOSPITAL_COMMUNITY): Payer: Medicare Other

## 2019-08-31 DIAGNOSIS — R079 Chest pain, unspecified: Secondary | ICD-10-CM

## 2019-08-31 LAB — BASIC METABOLIC PANEL
Anion gap: 10 (ref 5–15)
BUN: 15 mg/dL (ref 8–23)
CO2: 27 mmol/L (ref 22–32)
Calcium: 8.4 mg/dL — ABNORMAL LOW (ref 8.9–10.3)
Chloride: 107 mmol/L (ref 98–111)
Creatinine, Ser: 1.01 mg/dL (ref 0.61–1.24)
GFR calc Af Amer: >60 mL/min (ref >60)
GFR calc non Af Amer: >60 mL/min (ref >60)
Glucose, Bld: 122 mg/dL — ABNORMAL HIGH (ref 70–99)
Potassium: 3.1 mmol/L — ABNORMAL LOW (ref 3.5–5.1)
Sodium: 144 mmol/L (ref 135–145)

## 2019-08-31 LAB — NM MYOCAR MULTI W/SPECT W/WALL MOTION / EF
Peak HR: 91 {beats}/min
Rest HR: 53 {beats}/min

## 2019-08-31 LAB — CULTURE, RESPIRATORY W GRAM STAIN: Special Requests: NORMAL

## 2019-08-31 LAB — MAGNESIUM: Magnesium: 1.8 mg/dL (ref 1.7–2.4)

## 2019-08-31 LAB — GLUCOSE, CAPILLARY
Glucose-Capillary: 107 mg/dL — ABNORMAL HIGH (ref 70–99)
Glucose-Capillary: 113 mg/dL — ABNORMAL HIGH (ref 70–99)
Glucose-Capillary: 125 mg/dL — ABNORMAL HIGH (ref 70–99)
Glucose-Capillary: 105 mg/dL — ABNORMAL HIGH (ref 70–99)

## 2019-08-31 LAB — PHOSPHORUS: Phosphorus: 3.6 mg/dL (ref 2.5–4.6)

## 2019-08-31 MED ORDER — POTASSIUM CHLORIDE 10 MEQ/100ML IV SOLN
10.0000 meq | INTRAVENOUS | Status: AC
  Administered 2019-08-31 (×2): 10 meq via INTRAVENOUS
  Filled 2019-08-31 (×2): qty 100

## 2019-08-31 MED ORDER — REGADENOSON 0.4 MG/5ML IV SOLN
INTRAVENOUS | Status: AC
  Filled 2019-08-31: qty 5

## 2019-08-31 MED ORDER — POTASSIUM CHLORIDE 20 MEQ PO PACK
40.0000 meq | PACK | Freq: Once | ORAL | Status: AC
  Administered 2019-08-31: 11:00:00 40 meq via ORAL
  Filled 2019-08-31: qty 2

## 2019-08-31 MED ORDER — TECHNETIUM TC 99M TETROFOSMIN IV KIT
30.3000 | PACK | Freq: Once | INTRAVENOUS | Status: AC | PRN
  Administered 2019-08-31: 10:00:00 30.3 via INTRAVENOUS

## 2019-08-31 MED ORDER — TECHNETIUM TC 99M TETROFOSMIN IV KIT
10.6500 | PACK | Freq: Once | INTRAVENOUS | Status: AC | PRN
  Administered 2019-08-31: 08:00:00 10.65 via INTRAVENOUS

## 2019-08-31 MED ORDER — POTASSIUM CHLORIDE 10 MEQ/100ML IV SOLN
INTRAVENOUS | Status: AC
  Administered 2019-08-31: 13:00:00 10 meq via INTRAVENOUS
  Filled 2019-08-31: qty 100

## 2019-08-31 MED ORDER — REGADENOSON 0.4 MG/5ML IV SOLN
0.4000 mg | Freq: Once | INTRAVENOUS | Status: AC
  Administered 2019-08-31: 0.4 mg via INTRAVENOUS
  Filled 2019-08-31: qty 5

## 2019-08-31 NOTE — Progress Notes (Addendum)
Progress Note  Patient Name: Gary Davis Date of Encounter: 08/31/2019  Primary Cardiologist: No primary care provider on file.   Subjective   Patient seen in stress lab. Initially very sleepy but woke and talked to me. He has some vague chest discomfort, no dyspnea. His biggest complaint is right arm pain.  He is aware that he is in the hospital but when asked if he is aware of the reason he says "conflicting stories". I explained the stress test and its purpose and he requested that I Davis his brother Gary Davis and explain to him, which I did.   Inpatient Medications    Scheduled Meds:  regadenoson       Chlorhexidine Gluconate Cloth  6 each Topical Daily   heparin injection (subcutaneous)  5,000 Units Subcutaneous Q8H   pantoprazole  40 mg Oral QHS   polyethylene glycol  17 g Oral Daily   potassium chloride  40 mEq Oral Once   regadenoson  0.4 mg Intravenous Once   senna-docusate  1 tablet Per Tube Daily   Continuous Infusions:  sodium chloride Stopped (08/30/19 1659)   sodium chloride      ceFAZolin (ANCEF) IV 2 g (08/31/19 0801)   sodium chloride     PRN Meds: Place/Maintain arterial line **AND** sodium chloride, bisacodyl, docusate, Resource ThickenUp Clear   Vital Signs    Vitals:   08/31/19 0500 08/31/19 0700 08/31/19 0900 08/31/19 0949  BP: 128/60 125/76 138/75 (!) 149/67  Pulse: (!) 56 (!) 57 (!) 48   Resp: (!) 29 (!) 24 20   Temp:      TempSrc:      SpO2:  92% 95%   Weight:      Height:        Intake/Output Summary (Last 24 hours) at 08/31/2019 0954 Last data filed at 08/31/2019 0551 Gross per 24 hour  Intake 449.82 ml  Output 1545 ml  Net -1095.18 ml   Last 3 Weights 08/30/2019 08/29/2019 08/28/2019  Weight (lbs) 181 lb 3.5 oz 183 lb 6.8 oz 192 lb 3.9 oz  Weight (kg) 82.2 kg 83.2 kg 87.2 kg      Telemetry    SR/SB while in stress lab.    ECG    SB/SR with nonspecific mild Twave changes inferiorly while in stress lab. QRS is no longer  wide - Personally Reviewed  Physical Exam   GEN: No acute distress.   Neck: No JVD Cardiac: RRR, no murmurs, rubs, or gallops.  Respiratory: Clear to auscultation bilaterally. GI: Soft, nontender, non-distended  MS: No edema; Right arm with decreased ROM, painful Neuro:  Alert to self and place. Unclear how much he is understanding Psych: Flat affect   Labs    High Sensitivity Troponin:   Recent Labs  Lab 08/23/19 1720 08/23/19 1905 08/24/19 0525  TROPONINIHS 171* 210* 1,350*      Chemistry Recent Labs  Lab 08/28/19 0302 08/28/19 0302 08/29/19 0502 08/30/19 0312 08/31/19 0316  NA 141   < > 138 142 144  K 3.5   < > 3.4* 3.3* 3.1*  CL 105   < > 103 106 107  CO2 26   < > 26 27 27   GLUCOSE 135*   < > 140* 113* 122*  BUN 15   < > 19 17 15   CREATININE 1.12   < > 0.92 1.08 1.01  CALCIUM 8.9   < > 8.7* 8.5* 8.4*  PROT 6.2*  --  5.6* 5.5*  --  ALBUMIN 2.8*  --  2.4* 2.3*  --   AST 70*  --  39 33  --   ALT 57*  --  38 30  --   ALKPHOS 68  --  58 52  --   BILITOT 1.0  --  0.7 0.8  --   GFRNONAA >60   < > >60 >60 >60  GFRAA >60   < > >60 >60 >60  ANIONGAP 10   < > 9 9 10    < > = values in this interval not displayed.     Hematology Recent Labs  Lab 08/27/19 0413 08/28/19 0302 08/29/19 0502  WBC 6.2 9.1 8.0  RBC 3.50* 4.05* 3.69*  HGB 11.7* 13.5 12.3*  HCT 34.9* 40.2 36.2*  MCV 99.7 99.3 98.1  MCH 33.4 33.3 33.3  MCHC 33.5 33.6 34.0  RDW 13.1 12.5 12.5  PLT 108* 123* 137*    BNPNo results for input(s): BNP, PROBNP in the last 168 hours.   DDimer No results for input(s): DDIMER in the last 168 hours.   Radiology    DG Wrist Complete Right  Result Date: 08/30/2019 CLINICAL DATA:  64 year old male with wrist pain EXAM: RIGHT WRIST - COMPLETE 3+ VIEW COMPARISON:  None. FINDINGS: Osteopenia. No acute displaced fracture. Mild degenerative changes of the carpal bones with the alignment maintained. No focal soft tissue swelling. No radiopaque foreign body.  Unremarkable appearance of the scaphoid on the scaphoid view IMPRESSION: Negative for acute bony abnormality Electronically Signed   By: 74 D.O.   On: 08/30/2019 13:49    Cardiac Studies   Echocardiogram 08/24/2019 IMPRESSIONS    1. Left ventricular ejection fraction, by estimation, is 30%. The left  ventricle has moderate to severely decreased function. The left ventricle  demonstrates global hypokinesis. Left ventricular diastolic parameters are  indeterminate.   2. Right ventricular systolic function is severely reduced. The right  ventricular size is moderately enlarged. Unable to assess PASP.   3. Right atrial size was mildly dilated.   4. The mitral valve is grossly normal. Mild mitral valve regurgitation.   5. Tricuspid valve regurgitation is mild to moderate.   6. The aortic valve is tricuspid. Aortic valve regurgitation is not  visualized.   Patient Profile     64 y.o. male with a hx of cocaine and alcohol abuse, arthritis, chronic back pain, depression and GERD who presented unconscious and hypothermic on 08/23/19 after polysubstance abuse and 1 round of CPR, likely PEA arrest. Cardiology asked to evaluate for elevated troponin. Echo showed LV dysfunction with EF 30%. Pt with rhabdomyolysis.   Assessment & Plan    PEA arrest in the setting or polysubstance abuse and hypothermia -Not likely a cardiac event but likely due to aspiration. Pt presented with elevated CK.   LV dysfunction -EF 30% on echo with global hypokinesis. -Appears euvolemic. -No prior known history of heart disease.  -Pt undergoing lexiscan myoview today to assess for ischemia. Would only consider heart cath in this patient if the myoview is high risk due to ischemia (not LV function). Compliance might be an issue, but would recommend guideline directed medical therapy.   Encephalopathy -Unclear length of downtime without circulation and baseline neurostatus. He seems to be improving.       For questions or updates, please contact [facility] Please consult [email or web address] for contact info under   Signed, 08/25/19, NP  08/31/2019, 9:54 AM    EP Attending  Patient seen and examined. Agree  with above. The patient states he is about ready to go home. He denies chest pain or sob. His Gary Davis is pending. His exam is as noted above. If his stress test (pending) does not show significant ischemia, he can be discharged home on guide line directed medical therapy. I have strongly encouraged the patient not to use ETOH/Cocaine/Marijuana.   Mikle Bosworth.D.

## 2019-08-31 NOTE — Progress Notes (Signed)
Per Dr. Renford Dills, RN does not need to go to Nuclear Scan with patient.

## 2019-08-31 NOTE — Progress Notes (Signed)
Patients brother [name], made aware of patients transfer to [id number]

## 2019-08-31 NOTE — Progress Notes (Signed)
PROGRESS NOTE    Gary Davis  NWG:[id number] DOB: 05-23-1956 DOA: 08/23/2019 PCP: Medicine, [facility]   Brief Narrative:  Patient is a 64 year old male with history of cocaine/alcohol abuse, depression who was found to be unresponsive by his roommate with unknown downtime.  Last known well was 5 hours before.  EMS found patient unresponsive, pulseless, ROSC was after 1 round of CPR.  Unresponsive and intubated in the emergency department.  PCCM consulted for admission.  He did not show any signs of a STEMI or significant ischemia.  Lactic acid was elevated with respiratory status, renal insufficiency.  Negative for flu and Covid test 19.  He required Levophed for hypotension and by bair hugger for hypothermia.  He was extubated on 08/2619.  Patient transferred to [facility] service on 08/30/2019.Cardiology also consulted due to reduced ejection fraction, plan for nuclear stress test.  PT evaluated the patient and recommended skilled nursing facility on discharge.  Assessment & Plan:   Active Problems:   Acute respiratory failure (HCC)   Cardiac arrest: In the setting of alcohol/cocaine use.  No history of IV drug use or overdose.  Elevated troponin with EKG showing paroxysmal A. fib, LBBB.  Currently in normal sinus rhythm.  Systolic heart failure: Echocardiogram has shown ejection fraction of 30%, moderate to severely decreased left ventricular function, global hypokinesis.  Cardiology following.  Plan for nuclear stress test.  Currently he is euvolemic  Acute hypoxic/hypercapnic respiratory failure: Aspiration pneumonitis as per CT.  Now with aspiration pneumonia showing MSSA in sputum.  Has left 10th rib fracture. Antibiotics changed to Ancef to complete the course until 3/3  Acute metabolic  encephalopathy: Concern for anoxic brain injury.  Hypothermic on arrival unknown downtime.  Continue to monitor mental status.  Frequent orientation.  Currently he is alert and  oriented  Nonoliguric AKI: Baseline creatinine normal about 3 years ago.  Also had rhabdomyolysis on presentation.  Kidney function has improved and is at baseline. Chronic bladder outlet obstruction noted on ultrasound.  Dicontinued Foley.  Hypokalemia: Being supplemented  Left perinephric hematoma: As per CT on 2/20.  Stable on renal ultrasound as per 2/25.  Thrombocytopenia: Improving  Normocytic anemia: Currently H&H stable  Lactic acidosis: Resolved  Suspected cholecystitis: On imaging without elevated bilirubin and alkaline phosphatase.  Transaminase elevation likely attributable to rhabdomyolysis.  GGT normal no further imaging stable at this time.  Denies any abdominal pain.  Abdomen nontender  History of arthritis/cervical fusion: Continue pain management and supportive care  Right middle lobe pulmonary nodule: 1.  4 cm.  Needs outpatient pulmonary evaluation  Right wrist pain/deformity: X-ray of the right wrist negative for fracture dislocation  Debility/deconditioning: Evaluate PT and recommended skilled nursing facility.  Transition of care consulted    Nutrition Problem: Inadequate oral intake Etiology: acute illness, inability to eat      DVT prophylaxis:Heparin [place] Code Status: Full Family Communication: None Disposition Plan: Patient is from home.  Needs PT/OT evaluation.  Undergoing nuclear stress test.  Not ready for discharge   Consultants: Pulmonology, cardiology  Procedures: intubation  Antimicrobials:  Anti-infectives (From admission, onward)   Start     Dose/Rate Route Frequency Ordered Stop   08/29/19 1200  ceFAZolin (ANCEF) IVPB 2g/100 mL premix     2 g 200 mL/hr over 30 Minutes Intravenous Every 8 hours 08/29/19 0826 09/04/19 0559   08/29/19 0600  vancomycin (VANCOREADY) IVPB 1750 mg/350 mL  Status:  Discontinued     1,750 mg 175 mL/hr  over 120 Minutes Intravenous Every 24 hours 08/28/19 0224 08/28/19 0949   08/28/19 1000  cefTRIAXone  (ROCEPHIN) 2 g in sodium chloride 0.9 % 100 mL IVPB  Status:  Discontinued     2 g 200 mL/hr over 30 Minutes Intravenous Every 24 hours 08/28/19 0949 08/29/19 0826   08/28/19 0315  vancomycin (VANCOREADY) IVPB 2000 mg/400 mL     2,000 mg 200 mL/hr over 120 Minutes Intravenous  Once 08/28/19 0224 08/28/19 0541   08/28/19 0300  ceFEPIme (MAXIPIME) 2 g in sodium chloride 0.9 % 100 mL IVPB  Status:  Discontinued     2 g 200 mL/hr over 30 Minutes Intravenous 2 times daily 08/28/19 0224 08/28/19 0949   08/26/19 2000  vancomycin (VANCOREADY) IVPB 1500 mg/300 mL  Status:  Discontinued     1,500 mg 150 mL/hr over 120 Minutes Intravenous Every 48 hours 08/24/19 1310 08/26/19 0823   08/24/19 1900  ceFEPIme (MAXIPIME) 2 g in sodium chloride 0.9 % 100 mL IVPB  Status:  Discontinued     2 g 200 mL/hr over 30 Minutes Intravenous Every 24 hours 08/23/19 1903 08/24/19 1310   08/24/19 1315  ceFEPIme (MAXIPIME) 2 g in sodium chloride 0.9 % 100 mL IVPB  Status:  Discontinued     2 g 200 mL/hr over 30 Minutes Intravenous Every 12 hours 08/24/19 1310 08/26/19 0940   08/23/19 1853  vancomycin variable dose per unstable renal function (pharmacist dosing)  Status:  Discontinued      Does not apply See admin instructions 08/23/19 1854 08/24/19 1310   08/23/19 1845  ceFEPIme (MAXIPIME) 2 g in sodium chloride 0.9 % 100 mL IVPB     2 g 200 mL/hr over 30 Minutes Intravenous  Once 08/23/19 1840 08/23/19 2047   08/23/19 1845  metroNIDAZOLE (FLAGYL) IVPB 500 mg     500 mg 100 mL/hr over 60 Minutes Intravenous  Once 08/23/19 1840 08/23/19 2047   08/23/19 1845  vancomycin (VANCOREADY) IVPB 1500 mg/300 mL     1,500 mg 150 mL/hr over 120 Minutes Intravenous  Once 08/23/19 1840 08/23/19 2150      Subjective:  Patient seen and examined at the bedside this morning.  Lying on the bed.  Has generalized weakness.  Going for stress test today.  Denies any complaints  Objective: Vitals:   08/31/19 0300 08/31/19 0320  08/31/19 0400 08/31/19 0500  BP: 122/64  121/62 128/60  Pulse: 61  (!) 58 (!) 56  Resp: 19  (!) 26 (!) 29  Temp:  98.7 F (37.1 C)    TempSrc:  Oral    SpO2:      Weight:      Height:        Intake/Output Summary (Last 24 hours) at 08/31/2019 0734 Last data filed at 08/31/2019 0551 Gross per 24 hour  Intake 489.72 ml  Output 2145 ml  Net -1655.28 ml   Filed Weights   08/28/19 0500 08/29/19 0500 08/30/19 0500  Weight: 87.2 kg 83.2 kg 82.2 kg    Examination:  General exam: Chronically ill looking, generalized weakness Respiratory system: Bilateral diminished air sounds on the bases, no wheezes or crackles  Cardiovascular system: S1 & S2 heard, RRR. No JVD, murmurs, rubs, gallops or clicks. No pedal edema. Gastrointestinal system: Abdomen is nondistended, soft and nontender. No organomegaly or masses felt. Normal bowel sounds heard. Central nervous system: Alert and oriented. No focal neurological deficits. Extremities: No edema, no clubbing ,no cyanosis Skin: No rashes, lesions or ulcers,no  icterus ,no pallor   Data Reviewed: I have personally reviewed following labs and imaging studies  CBC: Recent Labs  Lab 08/25/19 0437 08/26/19 0500 08/27/19 0413 08/28/19 0302 08/29/19 0502  WBC 9.3 6.8 6.2 9.1 8.0  HGB 11.6* 11.4* 11.7* 13.5 12.3*  HCT 34.1* 33.8* 34.9* 40.2 36.2*  MCV 99.1 100.3* 99.7 99.3 98.1  PLT 126* 103* 108* 123* 137*   Basic Metabolic Panel: Recent Labs  Lab 08/26/19 0500 08/26/19 0500 08/26/19 1740 08/27/19 0413 08/28/19 0302 08/29/19 0502 08/30/19 0312 08/31/19 0316  NA 141   < >  --  144 141 138 142 144  K 3.4*   < >  --  3.3* 3.5 3.4* 3.3* 3.1*  CL 106   < >  --  108 105 103 106 107  CO2 26   < >  --  28 26 26 27 27   GLUCOSE 121*   < >  --  134* 135* 140* 113* 122*  BUN 17   < >  --  14 15 19 17 15   CREATININE 1.31*   < >  --  1.36* 1.12 0.92 1.08 1.01  CALCIUM 8.5*   < >  --  8.6* 8.9 8.7* 8.5* 8.4*  MG 1.8  --  1.9  --   --  1.7 1.8  1.8  PHOS 2.4*  --  2.2*  --   --  2.7 2.3* 3.6   < > = values in this interval not displayed.   GFR: Estimated Creatinine Clearance: 80 mL/min (by C-G formula based on SCr of 1.01 mg/dL). Liver Function Tests: Recent Labs  Lab 08/26/19 0500 08/27/19 0413 08/28/19 0302 08/29/19 0502 08/30/19 0312  AST 133* 94* 70* 39 33  ALT 81* 66* 57* 38 30  ALKPHOS 58 58 68 58 52  BILITOT 0.8 1.1 1.0 0.7 0.8  PROT 5.2* 5.5* 6.2* 5.6* 5.5*  ALBUMIN 2.7* 2.6* 2.8* 2.4* 2.3*   No results for input(s): LIPASE, AMYLASE in the last 168 hours. No results for input(s): AMMONIA in the last 168 hours. Coagulation Profile: No results for input(s): INR, PROTIME in the last 168 hours. Cardiac Enzymes: Recent Labs  Lab 08/26/19 0500 08/27/19 0413 08/28/19 0302 08/29/19 0502  CKTOTAL 2,310* 1,438* 846* 443*   BNP (last 3 results) No results for input(s): PROBNP in the last 8760 hours. HbA1C: No results for input(s): HGBA1C in the last 72 hours. CBG: Recent Labs  Lab 08/30/19 1217 08/30/19 1622 08/30/19 1916 08/30/19 2314 08/31/19 0318  GLUCAP 122* 98 141* 109* 107*   Lipid Profile: No results for input(s): CHOL, HDL, LDLCALC, TRIG, CHOLHDL, LDLDIRECT in the last 72 hours. Thyroid Function Tests: No results for input(s): TSH, T4TOTAL, FREET4, T3FREE, THYROIDAB in the last 72 hours. Anemia Panel: No results for input(s): VITAMINB12, FOLATE, FERRITIN, TIBC, IRON, RETICCTPCT in the last 72 hours. Sepsis Labs: Recent Labs  Lab 08/25/19 0940  LATICACIDVEN 1.2    Recent Results (from the past 240 hour(s))  Respiratory Panel by RT PCR (Flu A&B, Covid) - Nasopharyngeal Swab     Status: None   Collection Time: 08/23/19  5:26 PM   Specimen: Nasopharyngeal Swab  Result Value Ref Range Status   SARS Coronavirus 2 by RT PCR NEGATIVE NEGATIVE Final    Comment: (NOTE) SARS-CoV-2 target nucleic acids are NOT DETECTED. The SARS-CoV-2 RNA is generally detectable in upper respiratoy specimens  during the acute phase of infection. The lowest concentration of SARS-CoV-2 viral copies this assay can detect is  131 copies/mL. A negative result does not preclude SARS-Cov-2 infection and should not be used as the sole basis for treatment or other patient management decisions. A negative result may occur with  improper specimen collection/handling, submission of specimen other than nasopharyngeal swab, presence of viral mutation(s) within the areas targeted by this assay, and inadequate number of viral copies (<131 copies/mL). A negative result must be combined with clinical observations, patient history, and epidemiological information. The expected result is Negative. Fact Sheet for Patients:  https://www.moore.com/ Fact Sheet for Healthcare Providers:  https://www.young.biz/ This test is not yet ap proved or cleared by the [place] FDA and  has been authorized for detection and/or diagnosis of SARS-CoV-2 by FDA under an Emergency Use Authorization (EUA). This EUA will remain  in effect (meaning this test can be used) for the duration of the COVID-19 declaration under Section 564(b)(1) of the Act, 21 U.S.C. section 360bbb-3(b)(1), unless the authorization is terminated or revoked sooner.    Influenza A by PCR NEGATIVE NEGATIVE Final   Influenza B by PCR NEGATIVE NEGATIVE Final    Comment: (NOTE) The Xpert Xpress SARS-CoV-2/FLU/RSV assay is intended as an aid in  the diagnosis of influenza from Nasopharyngeal swab specimens and  should not be used as a sole basis for treatment. Nasal washings and  aspirates are unacceptable for Xpert Xpress SARS-CoV-2/FLU/RSV  testing. Fact Sheet for Patients: https://www.moore.com/ Fact Sheet for Healthcare Providers: https://www.young.biz/ This test is not yet approved or cleared by the [place] FDA and  has been authorized for detection and/or diagnosis of  SARS-CoV-2 by  FDA under an Emergency Use Authorization (EUA). This EUA will remain  in effect (meaning this test can be used) for the duration of the  Covid-19 declaration under Section 564(b)(1) of the Act, 21  U.S.C. section 360bbb-3(b)(1), unless the authorization is  terminated or revoked. Performed at [facility], [address], [place], [place] [address]   Urine culture     Status: None   Collection Time: 08/23/19  5:26 PM   Specimen: Urine, Catheterized  Result Value Ref Range Status   Specimen Description URINE, CATHETERIZED  Final   Special Requests NONE  Final   Culture   Final    NO GROWTH Performed at [facility], [address] [place], [place] [address]    Report Status 08/25/2019 FINAL  Final  Blood Culture (routine x 2)     Status: None   Collection Time: 08/23/19  6:53 PM   Specimen: BLOOD  Result Value Ref Range Status   Specimen Description BLOOD RIGHT ARM  Final   Special Requests   Final    BOTTLES DRAWN AEROBIC AND ANAEROBIC Blood Culture adequate volume Performed at [facility], [address], [place], [place] [address]    Culture NO GROWTH 5 DAYS  Final   Report Status 08/28/2019 FINAL  Final  Blood Culture (routine x 2)     Status: None   Collection Time: 08/23/19  7:04 PM   Specimen: BLOOD  Result Value Ref Range Status   Specimen Description BLOOD RIGHT HAND  Final   Special Requests   Final    BOTTLES DRAWN AEROBIC AND ANAEROBIC Blood Culture results may not be optimal due to an inadequate volume of blood received in culture bottles Performed at [facility], [address] [place], [place] [address]    Culture NO GROWTH 5 DAYS  Final   Report Status 08/28/2019 FINAL  Final  MRSA PCR Screening     Status: None   Collection Time: 08/24/19  5:02 AM   Specimen: Nasal Mucosa; Nasopharyngeal  Result Value Ref Range Status   MRSA by PCR NEGATIVE NEGATIVE Final    Comment:        The GeneXpert MRSA Assay  (FDA approved for NASAL specimens only), is one component of a comprehensive MRSA colonization surveillance program. It is not intended to diagnose MRSA infection nor to guide or monitor treatment for MRSA infections. Performed at [facility], [address] [place], [place] [address]   Culture, respiratory     Status: None   Collection Time: 08/25/19  9:40 AM   Specimen: Tracheal Aspirate; Respiratory  Result Value Ref Range Status   Specimen Description TRACHEAL ASPIRATE  Final   Special Requests NONE  Final   Gram Stain   Final    MODERATE WBC PRESENT,BOTH PMN AND MONONUCLEAR RARE GRAM POSITIVE COCCI IN PAIRS Performed at [facility], [address] [place], [place] [address]    Culture FEW STAPHYLOCOCCUS AUREUS  Final   Report Status 08/28/2019 FINAL  Final   Organism ID, Bacteria STAPHYLOCOCCUS AUREUS  Final      Susceptibility   Staphylococcus aureus - MIC*    CIPROFLOXACIN <=0.5 SENSITIVE Sensitive     ERYTHROMYCIN <=0.25 SENSITIVE Sensitive     GENTAMICIN <=0.5 SENSITIVE Sensitive     OXACILLIN 0.5 SENSITIVE Sensitive     TETRACYCLINE <=1 SENSITIVE Sensitive     VANCOMYCIN <=0.5 SENSITIVE Sensitive     TRIMETH/SULFA <=10 SENSITIVE Sensitive     CLINDAMYCIN <=0.25 SENSITIVE Sensitive     RIFAMPIN <=0.5 SENSITIVE Sensitive     Inducible Clindamycin NEGATIVE Sensitive     * FEW STAPHYLOCOCCUS AUREUS  Culture, respiratory (non-expectorated)     Status: None (Preliminary result)   Collection Time: 08/28/19  2:37 AM   Specimen: Tracheal Aspirate; Respiratory  Result Value Ref Range Status   Specimen Description TRACHEAL ASPIRATE  Final   Special Requests Normal  Final   Gram Stain   Final    MODERATE WBC PRESENT, PREDOMINANTLY PMN MODERATE GRAM POSITIVE COCCI IN PAIRS IN CHAINS FEW GRAM NEGATIVE RODS FEW GRAM POSITIVE RODS    Culture   Final    FEW PROTEUS MIRABILIS MODERATE STAPHYLOCOCCUS AUREUS SUSCEPTIBILITIES TO FOLLOW Performed at [facility], [address], [place], [place] [address]    Report Status PENDING  Incomplete   Organism ID, Bacteria PROTEUS MIRABILIS  Final      Susceptibility   Proteus mirabilis - MIC*    AMPICILLIN <=2 SENSITIVE Sensitive     CEFAZOLIN <=4 SENSITIVE Sensitive     CEFEPIME <=0.12 SENSITIVE Sensitive     CEFTAZIDIME <=1 SENSITIVE Sensitive     CEFTRIAXONE <=0.25 SENSITIVE Sensitive     CIPROFLOXACIN <=0.25 SENSITIVE Sensitive     GENTAMICIN <=1 SENSITIVE Sensitive     IMIPENEM 2 SENSITIVE Sensitive     TRIMETH/SULFA <=20 SENSITIVE Sensitive     AMPICILLIN/SULBACTAM <=2 SENSITIVE Sensitive     PIP/TAZO <=4 SENSITIVE Sensitive     * FEW PROTEUS MIRABILIS  Culture, blood (Routine X 2) w Reflex to ID Panel     Status: None (Preliminary result)   Collection Time: 08/28/19  2:52 AM   Specimen: BLOOD  Result Value Ref Range Status   Specimen Description BLOOD LEFT HAND  Final   Special Requests   Final    BOTTLES DRAWN AEROBIC  AND ANAEROBIC Blood Culture results may not be optimal due to an inadequate volume of blood received in culture bottles   Culture   Final    NO GROWTH 2 DAYS Performed at [facility] [address], [place], [place] [address]    Report Status PENDING  Incomplete  Culture, blood (Routine X 2) w Reflex to ID Panel     Status: None (Preliminary result)   Collection Time: 08/28/19  3:02 AM   Specimen: BLOOD  Result Value Ref Range Status   Specimen Description BLOOD LEFT WRIST  Final   Special Requests   Final    BOTTLES DRAWN AEROBIC AND ANAEROBIC Blood Culture adequate volume   Culture   Final    NO GROWTH 2 DAYS Performed at [facility], [place] [address], [place], [place] [address]    Report Status PENDING  Incomplete         Radiology Studies: DG Wrist Complete Right  Result Date: 08/30/2019 CLINICAL DATA:  64 year old male with wrist pain EXAM: RIGHT WRIST - COMPLETE 3+ VIEW COMPARISON:  None. FINDINGS: Osteopenia. No acute  displaced fracture. Mild degenerative changes of the carpal bones with the alignment maintained. No focal soft tissue swelling. No radiopaque foreign body. Unremarkable appearance of the scaphoid on the scaphoid view IMPRESSION: Negative for acute bony abnormality Electronically Signed   By: Corrie Mckusick D.O.   On: 08/30/2019 13:49        Scheduled Meds: . Chlorhexidine Gluconate Cloth  6 each Topical Daily  . heparin injection (subcutaneous)  5,000 Units Subcutaneous Q8H  . pantoprazole  40 mg Oral QHS  . polyethylene glycol  17 g Oral Daily  . potassium chloride  40 mEq Oral Once  . senna-docusate  1 tablet Per Tube Daily   Continuous Infusions: . sodium chloride Stopped (08/30/19 1659)  . sodium chloride    .  ceFAZolin (ANCEF) IV 2 g (08/30/19 2111)  . potassium chloride 10 mEq (08/31/19 0622)  . sodium chloride       LOS: 8 days    Time spent: 35 mins,More than 50% of that time was spent in counseling and/or coordination of care.      Shelly Coss, MD [facility] P2/28/2021, 7:34 AM

## 2019-08-31 NOTE — Progress Notes (Signed)
    Patient presented for Lexiscan nuclear stress test. Tolerated procedure well. Pending final stress imaging result.  Berton Bon, AGNP-C 08/31/2019  10:25 AM Pager: [phone]

## 2019-09-01 ENCOUNTER — Encounter (HOSPITAL_COMMUNITY): Payer: Self-pay | Admitting: Pulmonary Disease

## 2019-09-01 LAB — BASIC METABOLIC PANEL
Anion gap: 12 (ref 5–15)
BUN: 11 mg/dL (ref 8–23)
CO2: 26 mmol/L (ref 22–32)
Calcium: 8.5 mg/dL — ABNORMAL LOW (ref 8.9–10.3)
Chloride: 105 mmol/L (ref 98–111)
Creatinine, Ser: 0.97 mg/dL (ref 0.61–1.24)
GFR calc Af Amer: >60 mL/min (ref >60)
GFR calc non Af Amer: >60 mL/min (ref >60)
Glucose, Bld: 107 mg/dL — ABNORMAL HIGH (ref 70–99)
Potassium: 3.3 mmol/L — ABNORMAL LOW (ref 3.5–5.1)
Sodium: 143 mmol/L (ref 135–145)

## 2019-09-01 LAB — PHOSPHORUS: Phosphorus: 3.3 mg/dL (ref 2.5–4.6)

## 2019-09-01 MED ORDER — CEPHALEXIN 500 MG PO CAPS
500.0000 mg | ORAL_CAPSULE | Freq: Three times a day (TID) | ORAL | Status: AC
  Administered 2019-09-01 – 2019-09-03 (×7): 500 mg via ORAL
  Filled 2019-09-01 (×7): qty 1

## 2019-09-01 MED ORDER — ACETAMINOPHEN 325 MG PO TABS
650.0000 mg | ORAL_TABLET | Freq: Four times a day (QID) | ORAL | Status: DC | PRN
  Administered 2019-09-01 – 2019-09-17 (×24): 650 mg via ORAL
  Filled 2019-09-01 (×24): qty 2

## 2019-09-01 MED ORDER — CHLORHEXIDINE GLUCONATE CLOTH 2 % EX PADS
6.0000 | MEDICATED_PAD | Freq: Every day | CUTANEOUS | Status: DC
  Administered 2019-09-01 – 2019-09-16 (×11): 6 via TOPICAL

## 2019-09-01 MED ORDER — NEPRO/CARBSTEADY PO LIQD
237.0000 mL | ORAL | Status: DC
  Administered 2019-09-01 – 2019-09-14 (×6): 237 mL via ORAL

## 2019-09-01 MED ORDER — POTASSIUM CHLORIDE CRYS ER 20 MEQ PO TBCR
40.0000 meq | EXTENDED_RELEASE_TABLET | Freq: Once | ORAL | Status: AC
  Administered 2019-09-01: 40 meq via ORAL
  Filled 2019-09-01: qty 2

## 2019-09-01 NOTE — NC FL2 (Signed)
[place] LEVEL OF CARE SCREENING TOOL     IDENTIFICATION  Patient Name: Gary Davis Birthdate: 02-08-1956 Sex: male Admission Date (Current Location): 08/23/2019  [facility] and [place] Number:  Herbalist and Address:  [facility], [place] [address], [place], [place] [address]      Provider Number: [id number]  Attending Physician Name and Address:  Shelly Coss, MD  Relative Name and Phone Number:  Percell Miller  brother [phone]    Current Level of Care: Hospital Recommended Level of Care: [place] Prior Approval Number:    Date Approved/Denied:   PASRR Number: [id number]  Discharge Plan: SNF    Current Diagnoses: Patient Active Problem List   Diagnosis Date Noted  . Acute respiratory failure ([place]) 08/23/2019  . Acute renal failure ([place])   . Cardiac arrest ([place])   . Lactic acidosis   . Shock ([place])   . Osteoarthritis of right knee 11/04/2015  . Total knee replacement status 11/04/2015  . Cervical spine fracture, initial encounter 04/15/2015  . Fall 11/02/2014  . Rib fractures 10/31/2014  . Esophageal reflux 03/02/2014  . Anemia, unspecified 03/02/2014  . Insomnia 03/02/2014  . IFG (impaired fasting glucose) 03/02/2014  . Central cord syndrome ([place]) 02/11/2014  . Myelopathy, spondylogenic, cervical 02/09/2014  . Bilateral arm weakness 02/04/2014  . Cord compression syndrome ([place]) 02/04/2014  . Tobacco use disorder 02/04/2014  . Arthritis 02/04/2014    Orientation RESPIRATION BLADDER Height & Weight     Self, Time, Situation, Place  Normal Continent, Indwelling catheter Weight: 75.5 kg Height:  6' (182.9 cm)  BEHAVIORAL SYMPTOMS/MOOD NEUROLOGICAL BOWEL NUTRITION STATUS      Continent Diet(please see d/c summary)  AMBULATORY STATUS COMMUNICATION OF NEEDS Skin   Extensive Assist Verbally Normal                       Personal Care Assistance Level of Assistance  Bathing, Feeding,  Dressing Bathing Assistance: Maximum assistance Feeding assistance: Limited assistance Dressing Assistance: Limited assistance     Functional Limitations Info  Sight, Hearing, Speech Sight Info: Adequate Hearing Info: Adequate Speech Info: Adequate    SPECIAL CARE FACTORS FREQUENCY  OT (By licensed OT), PT (By licensed PT)     PT Frequency: 5X per week OT Frequency: 3X per week            Contractures Contractures Info: Not present    Additional Factors Info  Code Status, Allergies Code Status Info: Full Allergies Info: Hydrocodone, percocet & tramadol           Current Medications (09/01/2019):  This is the current hospital active medication list Current Facility-Administered Medications  Medication Dose Route Frequency Provider Last Rate Last Admin  . 0.9 %  sodium chloride infusion   Intra-arterial PRN Chesley Mires, MD   Stopped at 08/30/19 1659  . 0.9 %  sodium chloride infusion  250 mL Intravenous Continuous Chesley Mires, MD      . acetaminophen (TYLENOL) tablet 650 mg  650 mg Oral Q6H PRN Shelly Coss, MD   650 mg at 09/01/19 1142  . bisacodyl (DULCOLAX) suppository 10 mg  10 mg Rectal Daily PRN Chesley Mires, MD      . ceFAZolin (ANCEF) IVPB 2g/100 mL premix  2 g Intravenous Q8H Rigoberto Noel, MD 200 mL/hr at 09/01/19 0648 2 g at 09/01/19 0648  . Chlorhexidine Gluconate Cloth 2 % PADS 6 each  6 each Topical Daily  Burnadette Pop, MD   6 each at 09/01/19 1050  . docusate (COLACE) 50 MG/5ML liquid 100 mg  100 mg Per Tube BID PRN Coralyn Helling, MD   100 mg at 08/28/19 2145  . feeding supplement (NEPRO CARB STEADY) liquid 237 mL  237 mL Oral Q24H Adhikari, Amrit, MD      . heparin injection 5,000 Units  5,000 Units Subcutaneous Q8H Burnadette Pop, MD   5,000 Units at 09/01/19 0556  . pantoprazole (PROTONIX) EC tablet 40 mg  40 mg Oral QHS Briant Sites, DO   40 mg at 08/31/19 2131  . polyethylene glycol (MIRALAX / GLYCOLAX) packet 17 g  17 g Oral Daily Briant Sites, DO      . Resource ThickenUp Clear   Oral PRN Oretha Milch, MD      . senna-docusate (Senokot-S) tablet 1 tablet  1 tablet Per Tube Daily Steffanie Dunn, DO   1 tablet at 08/29/19 6943  . sodium chloride 0.9 % bolus 1,000 mL  1,000 mL Intravenous Once Jamelle Rushing L, DO         Discharge Medications: Please see discharge summary for a list of discharge medications.  Relevant Imaging Results:  Relevant Lab Results:   Additional Information SSN- [id number]  Beckie Busing, RN

## 2019-09-01 NOTE — Progress Notes (Signed)
IV access lost during first dose of Ancef this AM. Patient refused another IV. Contacted Dr. Renford Dills, will change abx to PO. IV in L hand removed.

## 2019-09-01 NOTE — Progress Notes (Signed)
Nutrition Follow-up  DOCUMENTATION CODES:   Not applicable  INTERVENTION:  Magic cup TID with meals, each supplement provides 290 kcal and 9 grams of protein  Nepro Shake po daily, each supplement provides 425 kcal and 19 grams protein    NUTRITION DIAGNOSIS:   Inadequate oral intake related to acute illness, inability to eat as evidenced by NPO status.  Progressing, diet advanced  GOAL:   Patient will meet greater than or equal to 90% of their needs  Progressing  MONITOR:   Vent status, TF tolerance, Weight trends, Labs  REASON FOR ASSESSMENT:   Consult, Ventilator Enteral/tube feeding initiation and management  ASSESSMENT:  RD working remotely.  63 yo male admitted after being found unresponsive and admitted post cardiac arrest with subsequent respiratory failure and cardiogenic shock, nonoliguric AKI. PMH includes cocaine and EtOH abuse, depression  2/20 Intubated, Admit 2/26 Extubated  Per notes: - aspiration pneumonia; MSSA, on IV antibiotics - nuclear stress test showed low risk study, EF 59% - rhabdomyolysis on presentation, kidney function returned to baseline - SLP evaluation recommended dysphagia 1; nectar thick liquid  Per chart, patient hemodynamically stable for discharge to SNF pending bed availability.   Patient eating 50-100% x 3 documented meals 2/26-2/27. Will provide magic cup with meals and Nepro shake to aid with estimated needs.  Mild pitting BUE edema noted per RN assessment  Current wt 166.1 lbs  Diet Order:   Diet Order            DIET - DYS 1 Room service appropriate? Yes; Fluid consistency: Nectar Thick  Diet effective now              EDUCATION NEEDS:   Not appropriate for education at this time  Skin:  Skin Assessment: Reviewed RN Assessment  Last BM:  2/28  Height:   Ht Readings from Last 1 Encounters:  08/23/19 6' (1.829 m)    Weight:   Wt Readings from Last 1 Encounters:  09/01/19 75.5 kg    BMI:   Body mass index is 22.57 kg/m.  Estimated Nutritional Needs:   Kcal:  1900-2100  Protein:  95-105  Fluid:  >/= 1.9 L/day   Lars Masson, RD, LDN Clinical Nutrition After Hours/Weekend Pager # in Amion

## 2019-09-01 NOTE — Telephone Encounter (Signed)
Patient is still admitted. Will hold encounter in triage to follow up on when pt is discharged.

## 2019-09-01 NOTE — Progress Notes (Addendum)
PROGRESS NOTE    Gary Davis  TZG:[id number] DOB: [birth year] DOA: 08/23/2019 PCP: Medicine, [facility]   Brief Narrative:  Patient is a 64 year old male with history of cocaine/alcohol abuse, depression who was found to be unresponsive by his roommate with unknown downtime.  Last known well was 5 hours before.  EMS found patient unresponsive, pulseless, ROSC was after 1 round of CPR.  Unresponsive and intubated in the emergency department.  PCCM consulted for admission.  He did not show any signs of a STEMI or significant ischemia.  Lactic acid was elevated with respiratory status, renal insufficiency.  Negative for flu and Covid test 19.  He required Levophed for hypotension and by bair hugger for hypothermia.  He was extubated on 08/2619.  Patient transferred to [facility] service on 08/30/2019.Cardiology also consulted due to reduced ejection fraction, he underwent  nuclear stress test.  PT evaluated the patient and recommended skilled nursing facility on discharge. His work-up has been completed.  Patient is hemodynamically stable for discharge to skilled nursing facility soon as bed is available.  Assessment & Plan:   Active Problems:   Acute respiratory failure (HCC)   Cardiac arrest: In the setting of alcohol/cocaine use.  No history of IV drug use or overdose.  Elevated troponin with EKG showing paroxysmal A. fib, LBBB.  Currently in normal sinus rhythm.  Systolic heart failure: Echocardiogram has shown ejection fraction of 30%, moderate to severely decreased left ventricular function, global hypokinesis.  Cardiology following.    Currently he is euvolemic. He underwent nuclear stress test on 08/23/2019 which showed low risk study, ejection fraction of 59%.  Acute hypoxic/hypercapnic respiratory failure: Aspiration pneumonitis as per CT.  Now with aspiration pneumonia showing MSSA in sputum.  Has left 10th rib fracture. Antibiotics changed to Ancef to complete the course  until 3/3  Acute metabolic  encephalopathy: Concern for anoxic brain injury.  Hypothermic on arrival unknown downtime.  C  Currently he is alert and oriented  Nonoliguric AKI: Baseline creatinine normal about 3 years ago.  Also had rhabdomyolysis on presentation.  Kidney function has improved and is at baseline. Chronic bladder outlet obstruction noted on ultrasound.  Dicontinued Foley.  Hypokalemia: Being supplemented  Left perinephric hematoma: As per CT on 2/20.  Stable on renal ultrasound as per 2/25.  Thrombocytopenia: Improving  Normocytic anemia: Currently H&H stable  Lactic acidosis: Resolved  Suspected cholecystitis: On imaging without elevated bilirubin and alkaline phosphatase.  Transaminase elevation likely attributable to rhabdomyolysis.  GGT normal no further imaging stable at this time.  Denies any abdominal pain.  Abdomen nontender  History of arthritis/cervical fusion: Continue pain management and supportive care  Right middle lobe pulmonary nodule: 1.  4 cm.  Needs outpatient pulmonary evaluation  Right wrist pain/deformity: X-ray of the right wrist negative for fracture dislocation  Debility/deconditioning: Evaluated  By PT and recommended skilled nursing facility.  Transition of care consulted    Nutrition Problem: Inadequate oral intake Etiology: acute illness, inability to eat      DVT prophylaxis:Heparin Hart Code Status: Full Family Communication: Called brother on 09/01/19 on phone Disposition Plan: Patient is from home.  PT/OT evaluation done and recommended discussing facility.  He is medically stable for discharge as soon as bed is available   Consultants: Pulmonology, cardiology  Procedures: intubation  Antimicrobials:  Anti-infectives (From admission, onward)   Start     Dose/Rate Route Frequency Ordered Stop   08/29/19 1200  ceFAZolin (ANCEF) IVPB 2g/100 mL premix  2 g 200 mL/hr over 30 Minutes Intravenous Every 8 hours 08/29/19 0826  09/04/19 0559   08/29/19 0600  vancomycin (VANCOREADY) IVPB 1750 mg/350 mL  Status:  Discontinued     1,750 mg 175 mL/hr over 120 Minutes Intravenous Every 24 hours 08/28/19 0224 08/28/19 0949   08/28/19 1000  cefTRIAXone (ROCEPHIN) 2 g in sodium chloride 0.9 % 100 mL IVPB  Status:  Discontinued     2 g 200 mL/hr over 30 Minutes Intravenous Every 24 hours 08/28/19 0949 08/29/19 0826   08/28/19 0315  vancomycin (VANCOREADY) IVPB 2000 mg/400 mL     2,000 mg 200 mL/hr over 120 Minutes Intravenous  Once 08/28/19 0224 08/28/19 0541   08/28/19 0300  ceFEPIme (MAXIPIME) 2 g in sodium chloride 0.9 % 100 mL IVPB  Status:  Discontinued     2 g 200 mL/hr over 30 Minutes Intravenous 2 times daily 08/28/19 0224 08/28/19 0949   08/26/19 2000  vancomycin (VANCOREADY) IVPB 1500 mg/300 mL  Status:  Discontinued     1,500 mg 150 mL/hr over 120 Minutes Intravenous Every 48 hours 08/24/19 1310 08/26/19 0823   08/24/19 1900  ceFEPIme (MAXIPIME) 2 g in sodium chloride 0.9 % 100 mL IVPB  Status:  Discontinued     2 g 200 mL/hr over 30 Minutes Intravenous Every 24 hours 08/23/19 1903 08/24/19 1310   08/24/19 1315  ceFEPIme (MAXIPIME) 2 g in sodium chloride 0.9 % 100 mL IVPB  Status:  Discontinued     2 g 200 mL/hr over 30 Minutes Intravenous Every 12 hours 08/24/19 1310 08/26/19 0940   08/23/19 1853  vancomycin variable dose per unstable renal function (pharmacist dosing)  Status:  Discontinued      Does not apply See admin instructions 08/23/19 1854 08/24/19 1310   08/23/19 1845  ceFEPIme (MAXIPIME) 2 g in sodium chloride 0.9 % 100 mL IVPB     2 g 200 mL/hr over 30 Minutes Intravenous  Once 08/23/19 1840 08/23/19 2047   08/23/19 1845  metroNIDAZOLE (FLAGYL) IVPB 500 mg     500 mg 100 mL/hr over 60 Minutes Intravenous  Once 08/23/19 1840 08/23/19 2047   08/23/19 1845  vancomycin (VANCOREADY) IVPB 1500 mg/300 mL     1,500 mg 150 mL/hr over 120 Minutes Intravenous  Once 08/23/19 1840 08/23/19 2150       Subjective:  Examined at the bedside this morning.  Hemodynamically stable.  Mostly bedbound, has generalized weakness.  Eager to go home.  Denies any complaints.  Objective: Vitals:   08/31/19 1315 08/31/19 2000 08/31/19 2318 09/01/19 0302  BP: (!) 141/90 134/69 (!) 142/77   Pulse: (!) 57 (!) 59 (!) 59   Resp: 16 16 18    Temp: 99.1 F (37.3 C) 98.5 F (36.9 C) 98.7 F (37.1 C)   TempSrc: Oral Oral    SpO2: 98% 98% 96%   Weight:    75.5 kg  Height:        Intake/Output Summary (Last 24 hours) at 09/01/2019 0755 Last data filed at 08/31/2019 1427 Gross per 24 hour  Intake 420 ml  Output 235 ml  Net 185 ml   Filed Weights   08/29/19 0500 08/30/19 0500 09/01/19 0302  Weight: 83.2 kg 82.2 kg 75.5 kg    Examination:   General exam: Generalized weakness Respiratory system: Diminished air sounds on the bases, no wheezes or crackles  Cardiovascular system: S1 & S2 heard, RRR. No JVD, murmurs, rubs, gallops or clicks. Gastrointestinal system: Abdomen is  nondistended, soft and nontender. No organomegaly or masses felt. Normal bowel sounds heard. Central nervous system: Alert and oriented. No focal neurological deficits. Extremities: No edema, no clubbing ,no cyanosis Skin: No rashes, lesions or ulcers,no icterus ,no pallor   Data Reviewed: I have personally reviewed following labs and imaging studies  CBC: Recent Labs  Lab 08/26/19 0500 08/27/19 0413 08/28/19 0302 08/29/19 0502  WBC 6.8 6.2 9.1 8.0  HGB 11.4* 11.7* 13.5 12.3*  HCT 33.8* 34.9* 40.2 36.2*  MCV 100.3* 99.7 99.3 98.1  PLT 103* 108* 123* 137*   Basic Metabolic Panel: Recent Labs  Lab 08/26/19 0500 08/26/19 0500 08/26/19 1740 08/27/19 0413 08/28/19 0302 08/29/19 0502 08/30/19 0312 08/31/19 0316 09/01/19 0239  NA 141  --   --    < > 141 138 142 144 143  K 3.4*  --   --    < > 3.5 3.4* 3.3* 3.1* 3.3*  CL 106  --   --    < > 105 103 106 107 105  CO2 26  --   --    < > 26 26 27 27 26   GLUCOSE  121*  --   --    < > 135* 140* 113* 122* 107*  BUN 17  --   --    < > 15 19 17 15 11   CREATININE 1.31*  --   --    < > 1.12 0.92 1.08 1.01 0.97  CALCIUM 8.5*  --   --    < > 8.9 8.7* 8.5* 8.4* 8.5*  MG 1.8  --  1.9  --   --  1.7 1.8 1.8  --   PHOS 2.4*   < > 2.2*  --   --  2.7 2.3* 3.6 3.3   < > = values in this interval not displayed.   GFR: Estimated Creatinine Clearance: 81.1 mL/min (by C-G formula based on SCr of 0.97 mg/dL). Liver Function Tests: Recent Labs  Lab 08/26/19 0500 08/27/19 0413 08/28/19 0302 08/29/19 0502 08/30/19 0312  AST 133* 94* 70* 39 33  ALT 81* 66* 57* 38 30  ALKPHOS 58 58 68 58 52  BILITOT 0.8 1.1 1.0 0.7 0.8  PROT 5.2* 5.5* 6.2* 5.6* 5.5*  ALBUMIN 2.7* 2.6* 2.8* 2.4* 2.3*   No results for input(s): LIPASE, AMYLASE in the last 168 hours. No results for input(s): AMMONIA in the last 168 hours. Coagulation Profile: No results for input(s): INR, PROTIME in the last 168 hours. Cardiac Enzymes: Recent Labs  Lab 08/26/19 0500 08/27/19 0413 08/28/19 0302 08/29/19 0502  CKTOTAL 2,310* 1,438* 846* 443*   BNP (last 3 results) No results for input(s): PROBNP in the last 8760 hours. HbA1C: No results for input(s): HGBA1C in the last 72 hours. CBG: Recent Labs  Lab 08/30/19 2314 08/31/19 0318 08/31/19 0748 08/31/19 1123 08/31/19 1634  GLUCAP 109* 107* 113* 125* 105*   Lipid Profile: No results for input(s): CHOL, HDL, LDLCALC, TRIG, CHOLHDL, LDLDIRECT in the last 72 hours. Thyroid Function Tests: No results for input(s): TSH, T4TOTAL, FREET4, T3FREE, THYROIDAB in the last 72 hours. Anemia Panel: No results for input(s): VITAMINB12, FOLATE, FERRITIN, TIBC, IRON, RETICCTPCT in the last 72 hours. Sepsis Labs: Recent Labs  Lab 08/25/19 0940  LATICACIDVEN 1.2    Recent Results (from the past 240 hour(s))  Respiratory Panel by RT PCR (Flu A&B, Covid) - Nasopharyngeal Swab     Status: None   Collection Time: 08/23/19  5:26 PM   Specimen:  Nasopharyngeal Swab  Result Value Ref Range Status   SARS Coronavirus 2 by RT PCR NEGATIVE NEGATIVE Final    Comment: (NOTE) SARS-CoV-2 target nucleic acids are NOT DETECTED. The SARS-CoV-2 RNA is generally detectable in upper respiratoy specimens during the acute phase of infection. The lowest concentration of SARS-CoV-2 viral copies this assay can detect is 131 copies/mL. A negative result does not preclude SARS-Cov-2 infection and should not be used as the sole basis for treatment or other patient management decisions. A negative result may occur with  improper specimen collection/handling, submission of specimen other than nasopharyngeal swab, presence of viral mutation(s) within the areas targeted by this assay, and inadequate number of viral copies (<131 copies/mL). A negative result must be combined with clinical observations, patient history, and epidemiological information. The expected result is Negative. Fact Sheet for Patients:  https://www.moore.com/ Fact Sheet for Healthcare Providers:  https://www.young.biz/ This test is not yet ap proved or cleared by the [place] FDA and  has been authorized for detection and/or diagnosis of SARS-CoV-2 by FDA under an Emergency Use Authorization (EUA). This EUA will remain  in effect (meaning this test can be used) for the duration of the COVID-19 declaration under Section 564(b)(1) of the Act, 21 U.S.C. section 360bbb-3(b)(1), unless the authorization is terminated or revoked sooner.    Influenza A by PCR NEGATIVE NEGATIVE Final   Influenza B by PCR NEGATIVE NEGATIVE Final    Comment: (NOTE) The Xpert Xpress SARS-CoV-2/FLU/RSV assay is intended as an aid in  the diagnosis of influenza from Nasopharyngeal swab specimens and  should not be used as a sole basis for treatment. Nasal washings and  aspirates are unacceptable for Xpert Xpress SARS-CoV-2/FLU/RSV  testing. Fact Sheet for  Patients: https://www.moore.com/ Fact Sheet for Healthcare Providers: https://www.young.biz/ This test is not yet approved or cleared by the [place] FDA and  has been authorized for detection and/or diagnosis of SARS-CoV-2 by  FDA under an Emergency Use Authorization (EUA). This EUA will remain  in effect (meaning this test can be used) for the duration of the  Covid-19 declaration under Section 564(b)(1) of the Act, 21  U.S.C. section 360bbb-3(b)(1), unless the authorization is  terminated or revoked. Performed at [facility], [address], [place], [place] [address]   Urine culture     Status: None   Collection Time: 08/23/19  5:26 PM   Specimen: Urine, Catheterized  Result Value Ref Range Status   Specimen Description URINE, CATHETERIZED  Final   Special Requests NONE  Final   Culture   Final    NO GROWTH Performed at [facility], [address], [place], [place] [address]    Report Status 08/25/2019 FINAL  Final  Blood Culture (routine x 2)     Status: None   Collection Time: 08/23/19  6:53 PM   Specimen: BLOOD  Result Value Ref Range Status   Specimen Description BLOOD RIGHT ARM  Final   Special Requests   Final    BOTTLES DRAWN AEROBIC AND ANAEROBIC Blood Culture adequate volume Performed at [facility], [address], [place], [place] [address]    Culture NO GROWTH 5 DAYS  Final   Report Status 08/28/2019 FINAL  Final  Blood Culture (routine x 2)     Status: None   Collection Time: 08/23/19  7:04 PM   Specimen: BLOOD  Result Value Ref Range Status   Specimen Description BLOOD RIGHT HAND  Final   Special Requests  Final    BOTTLES DRAWN AEROBIC AND ANAEROBIC Blood Culture results may not be optimal due to an inadequate volume of blood received in culture bottles Performed at [facility], [address] [place], [place] [address]    Culture NO GROWTH 5 DAYS  Final   Report Status 08/28/2019  FINAL  Final  MRSA PCR Screening     Status: None   Collection Time: 08/24/19  5:02 AM   Specimen: Nasal Mucosa; Nasopharyngeal  Result Value Ref Range Status   MRSA by PCR NEGATIVE NEGATIVE Final    Comment:        The GeneXpert MRSA Assay (FDA approved for NASAL specimens only), is one component of a comprehensive MRSA colonization surveillance program. It is not intended to diagnose MRSA infection nor to guide or monitor treatment for MRSA infections. Performed at [facility], [address] [place], [place] [address]   Culture, respiratory     Status: None   Collection Time: 08/25/19  9:40 AM   Specimen: Tracheal Aspirate; Respiratory  Result Value Ref Range Status   Specimen Description TRACHEAL ASPIRATE  Final   Special Requests NONE  Final   Gram Stain   Final    MODERATE WBC PRESENT,BOTH PMN AND MONONUCLEAR RARE GRAM POSITIVE COCCI IN PAIRS Performed at [facility], [address] [place], [place] [address]    Culture FEW STAPHYLOCOCCUS AUREUS  Final   Report Status 08/28/2019 FINAL  Final   Organism ID, Bacteria STAPHYLOCOCCUS AUREUS  Final      Susceptibility   Staphylococcus aureus - MIC*    CIPROFLOXACIN <=0.5 SENSITIVE Sensitive     ERYTHROMYCIN <=0.25 SENSITIVE Sensitive     GENTAMICIN <=0.5 SENSITIVE Sensitive     OXACILLIN 0.5 SENSITIVE Sensitive     TETRACYCLINE <=1 SENSITIVE Sensitive     VANCOMYCIN <=0.5 SENSITIVE Sensitive     TRIMETH/SULFA <=10 SENSITIVE Sensitive     CLINDAMYCIN <=0.25 SENSITIVE Sensitive     RIFAMPIN <=0.5 SENSITIVE Sensitive     Inducible Clindamycin NEGATIVE Sensitive     * FEW STAPHYLOCOCCUS AUREUS  Culture, respiratory (non-expectorated)     Status: None   Collection Time: 08/28/19  2:37 AM   Specimen: Tracheal Aspirate; Respiratory  Result Value Ref Range Status   Specimen Description TRACHEAL ASPIRATE  Final   Special Requests Normal  Final   Gram Stain   Final    MODERATE WBC PRESENT, PREDOMINANTLY  PMN MODERATE GRAM POSITIVE COCCI IN PAIRS IN CHAINS FEW GRAM NEGATIVE RODS FEW GRAM POSITIVE RODS Performed at [facility], [address] [place], [place] [address]    Culture   Final    FEW PROTEUS MIRABILIS MODERATE STAPHYLOCOCCUS AUREUS    Report Status 08/31/2019 FINAL  Final   Organism ID, Bacteria PROTEUS MIRABILIS  Final   Organism ID, Bacteria STAPHYLOCOCCUS AUREUS  Final      Susceptibility   Proteus mirabilis - MIC*    AMPICILLIN <=2 SENSITIVE Sensitive     CEFAZOLIN <=4 SENSITIVE Sensitive     CEFEPIME <=0.12 SENSITIVE Sensitive     CEFTAZIDIME <=1 SENSITIVE Sensitive     CEFTRIAXONE <=0.25 SENSITIVE Sensitive     CIPROFLOXACIN <=0.25 SENSITIVE Sensitive     GENTAMICIN <=1 SENSITIVE Sensitive     IMIPENEM 2 SENSITIVE Sensitive     TRIMETH/SULFA <=20 SENSITIVE Sensitive     AMPICILLIN/SULBACTAM <=2 SENSITIVE Sensitive     PIP/TAZO <=4 SENSITIVE Sensitive     *  FEW PROTEUS MIRABILIS   Staphylococcus aureus - MIC*    CIPROFLOXACIN <=0.5 SENSITIVE Sensitive     ERYTHROMYCIN <=0.25 SENSITIVE Sensitive     GENTAMICIN <=0.5 SENSITIVE Sensitive     OXACILLIN 0.5 SENSITIVE Sensitive     TETRACYCLINE <=1 SENSITIVE Sensitive     VANCOMYCIN <=0.5 SENSITIVE Sensitive     TRIMETH/SULFA <=10 SENSITIVE Sensitive     CLINDAMYCIN <=0.25 SENSITIVE Sensitive     RIFAMPIN <=0.5 SENSITIVE Sensitive     Inducible Clindamycin NEGATIVE Sensitive     * MODERATE STAPHYLOCOCCUS AUREUS  Culture, blood (Routine X 2) w Reflex to ID Panel     Status: None (Preliminary result)   Collection Time: 08/28/19  2:52 AM   Specimen: BLOOD  Result Value Ref Range Status   Specimen Description BLOOD LEFT HAND  Final   Special Requests   Final    BOTTLES DRAWN AEROBIC AND ANAEROBIC Blood Culture results may not be optimal due to an inadequate volume of blood received in culture bottles   Culture   Final    NO GROWTH 4 DAYS Performed at [facility], [address], [place], [place]  [address]    Report Status PENDING  Incomplete  Culture, blood (Routine X 2) w Reflex to ID Panel     Status: None (Preliminary result)   Collection Time: 08/28/19  3:02 AM   Specimen: BLOOD  Result Value Ref Range Status   Specimen Description BLOOD LEFT WRIST  Final   Special Requests   Final    BOTTLES DRAWN AEROBIC AND ANAEROBIC Blood Culture adequate volume   Culture   Final    NO GROWTH 4 DAYS Performed at [facility], [address], [place], [place] [address]    Report Status PENDING  Incomplete         Radiology Studies: DG Wrist Complete Right  Result Date: 08/30/2019 CLINICAL DATA:  64 year old male with wrist pain EXAM: RIGHT WRIST - COMPLETE 3+ VIEW COMPARISON:  None. FINDINGS: Osteopenia. No acute displaced fracture. Mild degenerative changes of the carpal bones with the alignment maintained. No focal soft tissue swelling. No radiopaque foreign body. Unremarkable appearance of the scaphoid on the scaphoid view IMPRESSION: Negative for acute bony abnormality Electronically Signed   By: Gilmer Mor D.O.   On: 08/30/2019 13:49   NM Myocar Multi W/Spect W/Wall Motion / EF  Result Date: 08/31/2019  EKG without changes  Normal perfusion very mild soft tissue attenuation (diaphragm, bowel) No significant ischemia or scar  LVEF 59%  Low risk study         Scheduled Meds: . heparin injection (subcutaneous)  5,000 Units Subcutaneous Q8H  . pantoprazole  40 mg Oral QHS  . polyethylene glycol  17 g Oral Daily  . potassium chloride  40 mEq Oral Once  . senna-docusate  1 tablet Per Tube Daily   Continuous Infusions: . sodium chloride Stopped (08/30/19 1659)  . sodium chloride    .  ceFAZolin (ANCEF) IV 2 g (09/01/19 7026)  . sodium chloride       LOS: 9 days    Time spent: 35 mins,More than 50% of that time was spent in counseling and/or coordination of care.      Burnadette Pop, MD [facility] P3/07/2019, 7:55 AM

## 2019-09-01 NOTE — Telephone Encounter (Signed)
So pt's appt will show up on AVS once pt is discharged from hospital, I have scheduled pt HFU appt. Nothing further needed.

## 2019-09-01 NOTE — TOC Initial Note (Signed)
Transition of Care [facility]) - Initial/Assessment Note    Patient Details  Name: Gary Davis MRN: [id number] Date of Birth: [birth year]  [facility] of [facility]) CM/SW Contact:    Beckie Busing, RN Phone Number: 09/01/2019, 11:47 AM  Clinical Narrative:                  CM received consult for possible SNF placement at time of discharge. CM spoke with patient regarding PT recommendation of SNF placement at time of discharge. Patient reported that patient's brother is currently unable to care for patient at their home given patient's current physical needs and fall risk. Patient expressed understanding of PT recommendation and is agreeable to SNF placement at time of discharge. CM discussed insurance authorization process and provided Medicare SNF ratings list. Patient expressed being hopeful for rehab and to feel better soon. No further questions reported at this time. CM to continue to follow and assist with discharge planning needs       Patient Goals and CMS Choice        Expected Discharge Plan and Services                                                Prior Living Arrangements/Services                       Activities of Daily Living      Permission Sought/Granted                  Emotional Assessment              Admission diagnosis:  Cardiac arrest [facility]) [I46.9] Other specified hypotension [I95.89] Acute respiratory failure (HCC) [J96.00] Respiratory failure (HCC) [J96.90] Lactic acidosis [E87.2] Unresponsive [R41.89] Hypothermia, initial encounter [T68.XXXA] Acute renal failure, unspecified acute renal failure type [facility]) [N17.9] Patient Active Problem List   Diagnosis Date Noted  . Acute respiratory failure (HCC) 08/23/2019  . Acute renal failure (HCC)   . Cardiac arrest (HCC)   . Lactic acidosis   . Shock (HCC)   . Osteoarthritis of right knee 11/04/2015  . Total knee replacement status 11/04/2015  . Cervical spine  fracture, initial encounter 04/15/2015  . Fall 11/02/2014  . Rib fractures 10/31/2014  . Esophageal reflux 03/02/2014  . Anemia, unspecified 03/02/2014  . Insomnia 03/02/2014  . IFG (impaired fasting glucose) 03/02/2014  . Central cord syndrome (HCC) 02/11/2014  . Myelopathy, spondylogenic, cervical 02/09/2014  . Bilateral arm weakness 02/04/2014  . Cord compression syndrome (HCC) 02/04/2014  . Tobacco use disorder 02/04/2014  . Arthritis 02/04/2014   PCP:  [facility]:   [facility] [phone] [place], [place] - [phone] AT [facility] [address] [place] [address] Phone: [phone] Fax: [phone]  [id number] - [address] [place] - [address] [place] [address] Phone: [phone] Fax: [phone]  [id number] - [place], [place] - [address] AT [facility] [address] [place] [address] Phone: [phone] Fax: [phone]  [facility] [place], [place] - [address] [place] [address] Phone: [phone] Fax: [phone]     Social Determinants of Health (SDOH) Interventions  Readmission Risk Interventions No flowsheet data found.

## 2019-09-01 NOTE — Evaluation (Signed)
Occupational Therapy Evaluation Patient Details Name: Gary Davis MRN: [id number] DOB: Aug 06, 1955 Today's Date: 09/01/2019    History of Present Illness Gary Davis is a 64 y.o. male with a hx of cocaine and alcohol abuse, arthritis, chronic back pain, depression and GERD who is being seen today for the evaluation of elevated troponin and atrial fibrillation status post cardiac arrest at the request of Dr. Tawanna Solo.  Extubated 2/26.     Clinical Impression   Pt PTA: Pt living at home independently. Pt currently very limited by hand deformities, decreased strength, decreased ability to care for self and decreased activity tolerance. Pt maxA to [place] for ADL and modA to maxA for mobility. Pt with headache and unable to stand pivot to recliner or take more than a few steps towards HOB. Pt would benefit from continued OT skilled services for ADL, mobility and safety in SNF setting. OT following acutely.    Follow Up Recommendations  SNF;Supervision/Assistance - 24 hour    Equipment Recommendations  None recommended by OT    Recommendations for Other Services       Precautions / Restrictions Precautions Precautions: Fall Restrictions Weight Bearing Restrictions: No      Mobility Bed Mobility Overal bed mobility: Needs Assistance Bed Mobility: Supine to Sit     Supine to sit: Mod assist;HOB elevated     General bed mobility comments: assist for LB management and trunk elevation  Transfers Overall transfer level: Needs assistance Equipment used: 2 person hand held assist Transfers: Sit to/from Stand Sit to Stand: Mod assist;From elevated surface         General transfer comment: Pt modA for power up and took shuffled steps to [facility].    Balance Overall balance assessment: Needs assistance Sitting-balance support: No upper extremity supported;Feet supported Sitting balance-Leahy Scale: Fair     Standing balance support: Bilateral upper extremity  supported;During functional activity Standing balance-Leahy Scale: Poor Standing balance comment: reliant on RW                           ADL either performed or assessed with clinical judgement   ADL Overall ADL's : Needs assistance/impaired Eating/Feeding: Set up;Bed level   Grooming: Maximal assistance;Sitting;Bed level   Upper Body Bathing: Maximal assistance;Sitting;Bed level   Lower Body Bathing: Maximal assistance;Total assistance;Sitting/lateral leans;Bed level   Upper Body Dressing : Maximal assistance;Sitting;Bed level   Lower Body Dressing: Maximal assistance;Total assistance;Sitting/lateral leans;Bed level   Toilet Transfer: Maximal assistance;Stand-pivot;RW Toilet Transfer Details (indicate cue type and reason): shuffled steps, decreased step height Toileting- Clothing Manipulation and Hygiene: Total assistance;Cueing for safety;Sitting/lateral lean;Bed level       Functional mobility during ADLs: Maximal assistance;Rolling walker General ADL Comments: Pt very limited by hand deformities, decreased strength, decreased ability to care for self and decreased activity tolerance.     Vision Baseline Vision/History: No visual deficits Patient Visual Report: No change from baseline Vision Assessment?: No apparent visual deficits     Perception     Praxis      Pertinent Vitals/Pain Pain Assessment: No/denies pain Pain Score: 8  Pain Location: headache Pain Descriptors / Indicators: Grimacing;Headache Pain Intervention(s): Monitored during session;Patient requesting pain meds-RN notified     Hand Dominance Right   Extremity/Trunk Assessment Upper Extremity Assessment Upper Extremity Assessment: Generalized weakness;RUE deficits/detail;LUE deficits/detail RUE Deficits / Details: 2/5 MM grade shoulders; elbow 3/5; wrist and hands limited due to contractures/arthritis RUE Coordination: decreased fine motor LUE Deficits /  Details: 2/5 MM grade  shoulders; elbow 3/5; wrist and hands limited due to contractures/arthritis LUE Coordination: decreased fine motor   Lower Extremity Assessment Lower Extremity Assessment: Generalized weakness   Cervical / Trunk Assessment Cervical / Trunk Assessment: Kyphotic   Communication Communication Communication: No difficulties   Cognition Arousal/Alertness: Awake/alert Behavior During Therapy: Anxious Overall Cognitive Status: Impaired/Different from baseline Area of Impairment: Orientation;Following commands;Safety/judgement;Awareness;Problem solving                 Orientation Level: Disoriented to;Situation;Place     Following Commands: Follows one step commands inconsistently;Follows one step commands with increased time Safety/Judgement: Decreased awareness of safety;Decreased awareness of deficits   Problem Solving: Slow processing;Decreased initiation;Difficulty sequencing;Requires verbal cues;Requires tactile cues     General Comments  VSS     Exercises     Shoulder Instructions      Home Living Family/patient expects to be discharged to:: Private residence Living Arrangements: Alone Available Help at Discharge: Family;Available 24 hours/day Type of Home: Apartment Home Access: Level entry     Home Layout: One level     Bathroom Shower/Tub: Chief Strategy Officer: Standard Bathroom Accessibility: Yes   Home Equipment: Cane - single point;Tub bench          Prior Functioning/Environment Level of Independence: Independent                 OT Problem List: Decreased strength;Decreased activity tolerance;Impaired balance (sitting and/or standing);Decreased safety awareness;Decreased cognition;Decreased coordination;Impaired UE functional use;Pain;Increased edema      OT Treatment/Interventions:      OT Goals(Current goals can be found in the care plan section) Acute Rehab OT Goals Patient Stated Goal: to get out of hospital OT Goal  Formulation: With patient Time For Goal Achievement: 09/15/19 Potential to Achieve Goals: Good ADL Goals Pt Will Perform Grooming: standing;with min assist;sitting Pt Will Transfer to Toilet: with min guard assist;stand pivot transfer;bedside commode Pt/caregiver will Perform Home Exercise Program: Increased strength;Right Upper extremity;Left upper extremity;Increased ROM;With Supervision Additional ADL Goal #1: Pt will increase to x32mins standing in prep for OOB ADL.  OT Frequency: Min 2X/week   Barriers to D/C: Decreased caregiver support          Co-evaluation              AM-PAC OT "6 Clicks" Daily Activity     Outcome Measure Help from another person eating meals?: A Little Help from another person taking care of personal grooming?: A Lot Help from another person toileting, which includes using toliet, bedpan, or urinal?: Total Help from another person bathing (including washing, rinsing, drying)?: Total Help from another person to put on and taking off regular upper body clothing?: A Lot Help from another person to put on and taking off regular lower body clothing?: Total 6 Click Score: 10   End of Session Equipment Utilized During Treatment: Gait belt;Rolling walker Nurse Communication: Mobility status  Activity Tolerance: Patient limited by fatigue;Patient limited by pain Patient left: in bed;with call bell/phone within reach;with bed alarm set;Other (comment)(Rn aware of pt on bedpan)  OT Visit Diagnosis: Unsteadiness on feet (R26.81);Muscle weakness (generalized) (M62.81);Pain Pain - part of body: (headache)                Time: 1607-3710 OT Time Calculation (min): 17 min Charges:  OT General Charges $OT Visit: 1 Visit OT Evaluation $OT Eval Moderate Complexity: 1 Mod  Flora Lipps, OTR/L Acute Rehabilitation Services Pager: [phone] Office: [phone]  Erving Sassano C 09/01/2019, 12:43 PM

## 2019-09-02 LAB — CULTURE, BLOOD (ROUTINE X 2)
Culture: NO GROWTH
Culture: NO GROWTH
Special Requests: ADEQUATE

## 2019-09-02 MED ORDER — POTASSIUM CHLORIDE CRYS ER 20 MEQ PO TBCR
40.0000 meq | EXTENDED_RELEASE_TABLET | Freq: Every day | ORAL | Status: DC
  Administered 2019-09-02 – 2019-09-17 (×16): 40 meq via ORAL
  Filled 2019-09-02 (×18): qty 2

## 2019-09-02 NOTE — Progress Notes (Addendum)
   09/02/19 1949  What Happened  Was fall witnessed? No  Patient found on floor  Found by Staff-comment (RR team RNs)  Stated prior activity ambulating-unassisted  Follow Up  MD notified Hosp on call (by W2 RN)  Time MD notified 1945  Family notified Yes - comment [name] (brother))  Time family notified 1954  Simple treatment Other (comment) (patient declines)  Adult Fall Risk Assessment  Risk Factor Category (scoring not indicated) Fall has occurred during this admission (document High fall risk)  Patient Fall Risk Level High fall risk  Adult Fall Risk Interventions  Required Bundle Interventions *See Row Information* High fall risk - low, moderate, and high requirements implemented  Additional Interventions Use of appropriate toileting equipment (bedpan, BSC, etc.)  Screening for Fall Injury Risk (To be completed on HIGH fall risk patients) - Assessing Need for Low Bed  Risk For Fall Injury- Low Bed Criteria Previous fall this admission  Will Implement Low Bed and Floor Mats Yes  Screening for Fall Injury Risk (To be completed on HIGH fall risk patients who do not meet crieteria for Low Bed) - Assessing Need for Floor Mats Only  Risk For Fall Injury- Criteria for Floor Mats Noncompliant with safety precautions  Will Implement Floor Mats Yes  Vitals  Temp 98.1 F (36.7 C)  Temp Source Oral  BP (!) 113/95  MAP (mmHg) 103  Pulse Rate (!) 56  Pulse Rate Source Dinamap  ECG Heart Rate (!) 58  Oxygen Therapy  SpO2 96 %  Pain Assessment  Pain Scale 0-10  Pain Score 0   RRT RN's arrived to room after hearing a thump from another room. Pt found on the ground on R shoulder and face to the ground after having a BM on the floor. Tooth was found on ground; however, patient stated it was "already loose and ready to fall out. Nothing hurts, I am leaving tomorrow with or without your permission." Pt returned to bed and cleaned up. No other visible injuries noted. Bed alarm on and fall  mats placed on the floor. The last time this RN was in the room, the bed alarm was on. Unsure of how bed alarm was taken off. MD notified by W2 RN. Brother, [name], called and updated on fall and patient condition by this RN after the fall. CT head ordered due to fall.

## 2019-09-02 NOTE — Progress Notes (Signed)
PROGRESS NOTE    Gary Davis  OJJ:[id number] DOB: 1955/07/04 DOA: 08/23/2019 PCP: Medicine, [facility]   Brief Narrative:  Patient is a 64 year old male with history of cocaine/alcohol abuse, depression who was found to be unresponsive by his roommate with unknown downtime.  Last known well was 5 hours before.  EMS found patient unresponsive, pulseless, ROSC was after 1 round of CPR.  Unresponsive and intubated in the emergency department.  PCCM consulted for admission.  He did not show any signs of a STEMI or significant ischemia.  Lactic acid was elevated with respiratory status, renal insufficiency.  Negative for flu and Covid test 19.  He required Levophed for hypotension and by bair hugger for hypothermia.  He was extubated on 08/2619.  Patient transferred to [facility] service on 08/30/2019.Cardiology also consulted due to reduced ejection fraction, he underwent  nuclear stress test.  PT evaluated the patient and recommended skilled nursing facility on discharge. His work-up has been completed.  Patient is hemodynamically stable for discharge to skilled nursing facility soon as bed is available.  Assessment & Plan:   Active Problems:   Acute respiratory failure (HCC)   Cardiac arrest: In the setting of alcohol/cocaine use.  No history of IV drug use or overdose.  Elevated troponin with EKG showing paroxysmal A. fib, LBBB.  Currently in normal sinus rhythm.  Systolic heart failure: Echocardiogram has shown ejection fraction of 30%, moderate to severely decreased left ventricular function, global hypokinesis.  Cardiology following.    Currently he is euvolemic. He underwent nuclear stress test on 08/23/2019 which showed low risk study, ejection fraction of 59%. Cardiology cleared him for discharge.  Acute hypoxic/hypercapnic respiratory failure: Aspiration pneumonitis as per CT.   MSSA in sputum.  Has left 10th rib fracture. Antibiotics changed to keflex  to complete the course  until 3/3  Acute metabolic  encephalopathy: Concern for anoxic brain injury.  Hypothermic on arrival unknown downtime.   Currently he is alert and oriented  Nonoliguric AKI: Baseline creatinine normal about 3 years ago.  Also had rhabdomyolysis on presentation.  Kidney function has improved and is at baseline. Chronic bladder outlet obstruction noted on ultrasound.  Dicontinued Foley.  Left perinephric hematoma: As per CT on 2/20.  Stable on renal ultrasound as per 2/25.  Thrombocytopenia: Stable  Normocytic anemia: Currently H&H stable  Suspected cholecystitis: Without elevated bilirubin and alkaline phosphatase.  Transaminase elevation likely attributable to rhabdomyolysis.  GGT normal no further imaging stable at this time.  Denies any abdominal pain.  Abdomen nontender  History of arthritis/cervical fusion: Continue pain management and supportive care  Right middle lobe pulmonary nodule: 1.  4 cm.  Needs outpatient pulmonary evaluation  Right wrist pain/deformity: X-ray of the right wrist negative for fracture dislocation  Debility/deconditioning: Evaluated  By PT and recommended skilled nursing facility.  Transition of care consulted    Nutrition Problem: Inadequate oral intake Etiology: acute illness, inability to eat      DVT prophylaxis:Heparin [place] Code Status: Full Family Communication: Called brother on 09/01/19 on phone Disposition Plan: Patient is from home.  PT/OT evaluation done and recommended SN facility.  He is medically stable for discharge as soon as bed is available   Consultants: Pulmonology, cardiology  Procedures: intubation  Antimicrobials:  Anti-infectives (From admission, onward)   Start     Dose/Rate Route Frequency Ordered Stop   09/01/19 1515  cephALEXin (KEFLEX) capsule 500 mg     500 mg Oral Every 8 hours 09/01/19  1508 09/03/19 2159   08/29/19 1200  ceFAZolin (ANCEF) IVPB 2g/100 mL premix  Status:  Discontinued     2 g 200 mL/hr over 30  Minutes Intravenous Every 8 hours 08/29/19 0826 09/01/19 1508   08/29/19 0600  vancomycin (VANCOREADY) IVPB 1750 mg/350 mL  Status:  Discontinued     1,750 mg 175 mL/hr over 120 Minutes Intravenous Every 24 hours 08/28/19 0224 08/28/19 0949   08/28/19 1000  cefTRIAXone (ROCEPHIN) 2 g in sodium chloride 0.9 % 100 mL IVPB  Status:  Discontinued     2 g 200 mL/hr over 30 Minutes Intravenous Every 24 hours 08/28/19 0949 08/29/19 0826   08/28/19 0315  vancomycin (VANCOREADY) IVPB 2000 mg/400 mL     2,000 mg 200 mL/hr over 120 Minutes Intravenous  Once 08/28/19 0224 08/28/19 0541   08/28/19 0300  ceFEPIme (MAXIPIME) 2 g in sodium chloride 0.9 % 100 mL IVPB  Status:  Discontinued     2 g 200 mL/hr over 30 Minutes Intravenous 2 times daily 08/28/19 0224 08/28/19 0949   08/26/19 2000  vancomycin (VANCOREADY) IVPB 1500 mg/300 mL  Status:  Discontinued     1,500 mg 150 mL/hr over 120 Minutes Intravenous Every 48 hours 08/24/19 1310 08/26/19 0823   08/24/19 1900  ceFEPIme (MAXIPIME) 2 g in sodium chloride 0.9 % 100 mL IVPB  Status:  Discontinued     2 g 200 mL/hr over 30 Minutes Intravenous Every 24 hours 08/23/19 1903 08/24/19 1310   08/24/19 1315  ceFEPIme (MAXIPIME) 2 g in sodium chloride 0.9 % 100 mL IVPB  Status:  Discontinued     2 g 200 mL/hr over 30 Minutes Intravenous Every 12 hours 08/24/19 1310 08/26/19 0940   08/23/19 1853  vancomycin variable dose per unstable renal function (pharmacist dosing)  Status:  Discontinued      Does not apply See admin instructions 08/23/19 1854 08/24/19 1310   08/23/19 1845  ceFEPIme (MAXIPIME) 2 g in sodium chloride 0.9 % 100 mL IVPB     2 g 200 mL/hr over 30 Minutes Intravenous  Once 08/23/19 1840 08/23/19 2047   08/23/19 1845  metroNIDAZOLE (FLAGYL) IVPB 500 mg     500 mg 100 mL/hr over 60 Minutes Intravenous  Once 08/23/19 1840 08/23/19 2047   08/23/19 1845  vancomycin (VANCOREADY) IVPB 1500 mg/300 mL     1,500 mg 150 mL/hr over 120 Minutes  Intravenous  Once 08/23/19 1840 08/23/19 2150      Subjective:  Patient seen and examined at the bedside this morning.  Hemodynamically stable.  Comfortable.  He is very frustrated because he does not like dysphagia 1 diet.  We have requested speech evaluation.  Denies any other complaints.  Objective: Vitals:   09/01/19 1715 09/01/19 2033 09/02/19 0202 09/02/19 0500  BP: 139/82 128/64 136/80   Pulse: (!) 47 (!) 50 (!) 50   Resp: 20     Temp: 98.3 F (36.8 C) 98.5 F (36.9 C) 98.2 F (36.8 C)   TempSrc:  Oral Oral   SpO2: 99% 95% 94%   Weight:    75.7 kg  Height:        Intake/Output Summary (Last 24 hours) at 09/02/2019 0747 Last data filed at 09/01/2019 2100 Gross per 24 hour  Intake 739.17 ml  Output 1700 ml  Net -960.83 ml   Filed Weights   08/30/19 0500 09/01/19 0302 09/02/19 0500  Weight: 82.2 kg 75.5 kg 75.7 kg    Examination:   General  exam: Generalized weakness Respiratory system: No crackles or wheezes Cardiovascular system: S1 & S2 heard, RRR. No JVD, murmurs, rubs, gallops or clicks. Gastrointestinal system: Abdomen is nondistended, soft and nontender. No organomegaly or masses felt. Normal bowel sounds heard. Central nervous system: Alert and oriented. No focal neurological deficits. Extremities: No edema, no clubbing ,no cyanosis, distal peripheral pulses palpable. Skin: No rashes, lesions or ulcers,no icterus ,no pallor   Data Reviewed: I have personally reviewed following labs and imaging studies  CBC: Recent Labs  Lab 08/27/19 0413 08/28/19 0302 08/29/19 0502  WBC 6.2 9.1 8.0  HGB 11.7* 13.5 12.3*  HCT 34.9* 40.2 36.2*  MCV 99.7 99.3 98.1  PLT 108* 123* 712*   Basic Metabolic Panel: Recent Labs  Lab 08/26/19 1740 08/27/19 0413 08/28/19 0302 08/29/19 0502 08/30/19 0312 08/31/19 0316 09/01/19 0239  NA  --    < > 141 138 142 144 143  K  --    < > 3.5 3.4* 3.3* 3.1* 3.3*  CL  --    < > 105 103 106 107 105  CO2  --    < > 26 26 27 27  26   GLUCOSE  --    < > 135* 140* 113* 122* 107*  BUN  --    < > 15 19 17 15 11   CREATININE  --    < > 1.12 0.92 1.08 1.01 0.97  CALCIUM  --    < > 8.9 8.7* 8.5* 8.4* 8.5*  MG 1.9  --   --  1.7 1.8 1.8  --   PHOS 2.2*  --   --  2.7 2.3* 3.6 3.3   < > = values in this interval not displayed.   GFR: Estimated Creatinine Clearance: 81.3 mL/min (by C-G formula based on SCr of 0.97 mg/dL). Liver Function Tests: Recent Labs  Lab 08/27/19 0413 08/28/19 0302 08/29/19 0502 08/30/19 0312  AST 94* 70* 39 33  ALT 66* 57* 38 30  ALKPHOS 58 68 58 52  BILITOT 1.1 1.0 0.7 0.8  PROT 5.5* 6.2* 5.6* 5.5*  ALBUMIN 2.6* 2.8* 2.4* 2.3*   No results for input(s): LIPASE, AMYLASE in the last 168 hours. No results for input(s): AMMONIA in the last 168 hours. Coagulation Profile: No results for input(s): INR, PROTIME in the last 168 hours. Cardiac Enzymes: Recent Labs  Lab 08/27/19 0413 08/28/19 0302 08/29/19 0502  CKTOTAL 1,438* 846* 443*   BNP (last 3 results) No results for input(s): PROBNP in the last 8760 hours. HbA1C: No results for input(s): HGBA1C in the last 72 hours. CBG: Recent Labs  Lab 08/30/19 2314 08/31/19 0318 08/31/19 0748 08/31/19 1123 08/31/19 1634  GLUCAP 109* 107* 113* 125* 105*   Lipid Profile: No results for input(s): CHOL, HDL, LDLCALC, TRIG, CHOLHDL, LDLDIRECT in the last 72 hours. Thyroid Function Tests: No results for input(s): TSH, T4TOTAL, FREET4, T3FREE, THYROIDAB in the last 72 hours. Anemia Panel: No results for input(s): VITAMINB12, FOLATE, FERRITIN, TIBC, IRON, RETICCTPCT in the last 72 hours. Sepsis Labs: No results for input(s): PROCALCITON, LATICACIDVEN in the last 168 hours.  Recent Results (from the past 240 hour(s))  Respiratory Panel by RT PCR (Flu A&B, Covid) - Nasopharyngeal Swab     Status: None   Collection Time: 08/23/19  5:26 PM   Specimen: Nasopharyngeal Swab  Result Value Ref Range Status   SARS Coronavirus 2 by RT PCR NEGATIVE  NEGATIVE Final    Comment: (NOTE) SARS-CoV-2 target nucleic acids are NOT DETECTED.  The SARS-CoV-2 RNA is generally detectable in upper respiratoy specimens during the acute phase of infection. The lowest concentration of SARS-CoV-2 viral copies this assay can detect is 131 copies/mL. A negative result does not preclude SARS-Cov-2 infection and should not be used as the sole basis for treatment or other patient management decisions. A negative result may occur with  improper specimen collection/handling, submission of specimen other than nasopharyngeal swab, presence of viral mutation(s) within the areas targeted by this assay, and inadequate number of viral copies (<131 copies/mL). A negative result must be combined with clinical observations, patient history, and epidemiological information. The expected result is Negative. Fact Sheet for Patients:  https://www.moore.com/ Fact Sheet for Healthcare Providers:  https://www.young.biz/ This test is not yet ap proved or cleared by the [place] FDA and  has been authorized for detection and/or diagnosis of SARS-CoV-2 by FDA under an Emergency Use Authorization (EUA). This EUA will remain  in effect (meaning this test can be used) for the duration of the COVID-19 declaration under Section 564(b)(1) of the Act, 21 U.S.C. section 360bbb-3(b)(1), unless the authorization is terminated or revoked sooner.    Influenza A by PCR NEGATIVE NEGATIVE Final   Influenza B by PCR NEGATIVE NEGATIVE Final    Comment: (NOTE) The Xpert Xpress SARS-CoV-2/FLU/RSV assay is intended as an aid in  the diagnosis of influenza from Nasopharyngeal swab specimens and  should not be used as a sole basis for treatment. Nasal washings and  aspirates are unacceptable for Xpert Xpress SARS-CoV-2/FLU/RSV  testing. Fact Sheet for Patients: https://www.moore.com/ Fact Sheet for Healthcare  Providers: https://www.young.biz/ This test is not yet approved or cleared by the [place] FDA and  has been authorized for detection and/or diagnosis of SARS-CoV-2 by  FDA under an Emergency Use Authorization (EUA). This EUA will remain  in effect (meaning this test can be used) for the duration of the  Covid-19 declaration under Section 564(b)(1) of the Act, 21  U.S.C. section 360bbb-3(b)(1), unless the authorization is  terminated or revoked. Performed at [facility], [address], [place], [place] [address]   Urine culture     Status: None   Collection Time: 08/23/19  5:26 PM   Specimen: Urine, Catheterized  Result Value Ref Range Status   Specimen Description URINE, CATHETERIZED  Final   Special Requests NONE  Final   Culture   Final    NO GROWTH Performed at [facility], [address] [place], [place] [address]    Report Status 08/25/2019 FINAL  Final  Blood Culture (routine x 2)     Status: None   Collection Time: 08/23/19  6:53 PM   Specimen: BLOOD  Result Value Ref Range Status   Specimen Description BLOOD RIGHT ARM  Final   Special Requests   Final    BOTTLES DRAWN AEROBIC AND ANAEROBIC Blood Culture adequate volume Performed at [facility], [address] [place], [place] [address]    Culture NO GROWTH 5 DAYS  Final   Report Status 08/28/2019 FINAL  Final  Blood Culture (routine x 2)     Status: None   Collection Time: 08/23/19  7:04 PM   Specimen: BLOOD  Result Value Ref Range Status   Specimen Description BLOOD RIGHT HAND  Final   Special Requests   Final    BOTTLES DRAWN AEROBIC AND ANAEROBIC Blood Culture results may not be optimal due to an inadequate volume of blood received in culture bottles Performed at  [facility], [address] [place], [place] [address]    Culture NO GROWTH 5 DAYS  Final   Report Status 08/28/2019 FINAL  Final  MRSA PCR Screening     Status: None   Collection Time: 08/24/19   5:02 AM   Specimen: Nasal Mucosa; Nasopharyngeal  Result Value Ref Range Status   MRSA by PCR NEGATIVE NEGATIVE Final    Comment:        The GeneXpert MRSA Assay (FDA approved for NASAL specimens only), is one component of a comprehensive MRSA colonization surveillance program. It is not intended to diagnose MRSA infection nor to guide or monitor treatment for MRSA infections. Performed at [facility], [address], [place], [place] [address]   Culture, respiratory     Status: None   Collection Time: 08/25/19  9:40 AM   Specimen: Tracheal Aspirate; Respiratory  Result Value Ref Range Status   Specimen Description TRACHEAL ASPIRATE  Final   Special Requests NONE  Final   Gram Stain   Final    MODERATE WBC PRESENT,BOTH PMN AND MONONUCLEAR RARE GRAM POSITIVE COCCI IN PAIRS Performed at [facility], [address], [place], [place] [address]    Culture FEW STAPHYLOCOCCUS AUREUS  Final   Report Status 08/28/2019 FINAL  Final   Organism ID, Bacteria STAPHYLOCOCCUS AUREUS  Final      Susceptibility   Staphylococcus aureus - MIC*    CIPROFLOXACIN <=0.5 SENSITIVE Sensitive     ERYTHROMYCIN <=0.25 SENSITIVE Sensitive     GENTAMICIN <=0.5 SENSITIVE Sensitive     OXACILLIN 0.5 SENSITIVE Sensitive     TETRACYCLINE <=1 SENSITIVE Sensitive     VANCOMYCIN <=0.5 SENSITIVE Sensitive     TRIMETH/SULFA <=10 SENSITIVE Sensitive     CLINDAMYCIN <=0.25 SENSITIVE Sensitive     RIFAMPIN <=0.5 SENSITIVE Sensitive     Inducible Clindamycin NEGATIVE Sensitive     * FEW STAPHYLOCOCCUS AUREUS  Culture, respiratory (non-expectorated)     Status: None   Collection Time: 08/28/19  2:37 AM   Specimen: Tracheal Aspirate; Respiratory  Result Value Ref Range Status   Specimen Description TRACHEAL ASPIRATE  Final   Special Requests Normal  Final   Gram Stain   Final    MODERATE WBC PRESENT, PREDOMINANTLY PMN MODERATE GRAM POSITIVE COCCI IN PAIRS IN CHAINS FEW GRAM NEGATIVE RODS FEW  GRAM POSITIVE RODS Performed at [facility], [address], [place], [place] [address]    Culture   Final    FEW PROTEUS MIRABILIS MODERATE STAPHYLOCOCCUS AUREUS    Report Status 08/31/2019 FINAL  Final   Organism ID, Bacteria PROTEUS MIRABILIS  Final   Organism ID, Bacteria STAPHYLOCOCCUS AUREUS  Final      Susceptibility   Proteus mirabilis - MIC*    AMPICILLIN <=2 SENSITIVE Sensitive     CEFAZOLIN <=4 SENSITIVE Sensitive     CEFEPIME <=0.12 SENSITIVE Sensitive     CEFTAZIDIME <=1 SENSITIVE Sensitive     CEFTRIAXONE <=0.25 SENSITIVE Sensitive     CIPROFLOXACIN <=0.25 SENSITIVE Sensitive     GENTAMICIN <=1 SENSITIVE Sensitive     IMIPENEM 2 SENSITIVE Sensitive     TRIMETH/SULFA <=20 SENSITIVE Sensitive     AMPICILLIN/SULBACTAM <=2 SENSITIVE Sensitive     PIP/TAZO <=4 SENSITIVE Sensitive     * FEW PROTEUS MIRABILIS   Staphylococcus aureus - MIC*    CIPROFLOXACIN <=0.5 SENSITIVE Sensitive     ERYTHROMYCIN <=0.25 SENSITIVE Sensitive     GENTAMICIN <=  0.5 SENSITIVE Sensitive     OXACILLIN 0.5 SENSITIVE Sensitive     TETRACYCLINE <=1 SENSITIVE Sensitive     VANCOMYCIN <=0.5 SENSITIVE Sensitive     TRIMETH/SULFA <=10 SENSITIVE Sensitive     CLINDAMYCIN <=0.25 SENSITIVE Sensitive     RIFAMPIN <=0.5 SENSITIVE Sensitive     Inducible Clindamycin NEGATIVE Sensitive     * MODERATE STAPHYLOCOCCUS AUREUS  Culture, blood (Routine X 2) w Reflex to ID Panel     Status: None   Collection Time: 08/28/19  2:52 AM   Specimen: BLOOD  Result Value Ref Range Status   Specimen Description BLOOD LEFT HAND  Final   Special Requests   Final    BOTTLES DRAWN AEROBIC AND ANAEROBIC Blood Culture results may not be optimal due to an inadequate volume of blood received in culture bottles   Culture   Final    NO GROWTH 5 DAYS Performed at [facility], [address] [place], [place] [address]    Report Status 09/02/2019 FINAL  Final  Culture, blood (Routine X 2) w Reflex to ID Panel      Status: None   Collection Time: 08/28/19  3:02 AM   Specimen: BLOOD  Result Value Ref Range Status   Specimen Description BLOOD LEFT WRIST  Final   Special Requests   Final    BOTTLES DRAWN AEROBIC AND ANAEROBIC Blood Culture adequate volume   Culture   Final    NO GROWTH 5 DAYS Performed at [facility], [address] [place], [place] [address]    Report Status 09/02/2019 FINAL  Final         Radiology Studies: NM Myocar Multi W/Spect W/Wall Motion / EF  Result Date: 08/31/2019  EKG without changes  Normal perfusion very mild soft tissue attenuation (diaphragm, bowel) No significant ischemia or scar  LVEF 59%  Low risk study         Scheduled Meds: . cephALEXin  500 mg Oral Q8H  . Chlorhexidine Gluconate Cloth  6 each Topical Daily  . feeding supplement (NEPRO CARB STEADY)  237 mL Oral Q24H  . heparin injection (subcutaneous)  5,000 Units Subcutaneous Q8H  . pantoprazole  40 mg Oral QHS  . polyethylene glycol  17 g Oral Daily  . senna-docusate  1 tablet Per Tube Daily   Continuous Infusions: . sodium chloride Stopped (08/30/19 1659)  . sodium chloride    . sodium chloride       LOS: 10 days    Time spent: 35 mins,More than 50% of that time was spent in counseling and/or coordination of care.      Burnadette PopAmrit Triana Coover, MD [facility] P3/08/2019, 7:47 AM

## 2019-09-02 NOTE — Progress Notes (Signed)
PT Cancellation Note  Patient Details Name: Gary Davis MRN: [id number] DOB: [birth year]   Cancelled Treatment:    Reason Eval/Treat Not Completed: Patient declined, no reason specified.  Pt does not feel like getting up right now.  Wants to wait to get up later.  Wants to eat "real food", so I reached out to SLP on the floor to see if they could re-assess and then tried to encourage him that being upright in the recliner would likely help his swallowing ability (being fully upright), but he continued to refuse.  PT to check back tomorrow.   Thanks,  Corinna Capra, PT, DPT  Acute Rehabilitation [phone] pager [phone] office  @ [facility]: [phone]     [name] 09/02/2019, 11:39 AM

## 2019-09-02 NOTE — Progress Notes (Signed)
Pt declined being transferred to the low mattress sent up for him. Pt stated that he no longer wanted me to ask him that question.

## 2019-09-02 NOTE — Progress Notes (Signed)
Pt refused ordered CT as well a IV start. Pt stated "Try again tomorrow because I don't feel like it tonight."

## 2019-09-02 NOTE — Plan of Care (Signed)
  Problem: Nutrition: Goal: Adequate nutrition will be maintained Outcome: Progressing  Diet progressed to Dysphagia 3, thin liquids. Pt starting to eat and drink more.

## 2019-09-02 NOTE — TOC Progression Note (Signed)
Transition of Care [facility]) - Progression Note    Patient Details  Name: Gary Davis MRN: [id number] Date of Birth: 12/09/1955  Transition of Care [facility]) CM/SW Contact  Mearl Latin, LCSW Phone Number: 09/02/2019, 8:28 AM  Clinical Narrative:    Patient currently has no SNF bed offers due to cocaine use. CSW to continue to follow.    Expected Discharge Plan: Skilled Nursing Facility Barriers to Discharge: SNF Pending bed offer  Expected Discharge Plan and Services Expected Discharge Plan: Skilled Nursing Facility                                               Social Determinants of Health (SDOH) Interventions    Readmission Risk Interventions Readmission Risk Prevention Plan 09/01/2019  Transportation Screening Complete  PCP or Specialist Appt within 5-7 Days Complete  Home Care Screening Complete  Medication Review (RN CM) Complete  Some recent data might be hidden

## 2019-09-02 NOTE — Progress Notes (Addendum)
  Speech Language Pathology Treatment: Dysphagia  Patient Details Name: Gary Davis MRN: [id number] DOB: 1956-03-26 Today's Date: 09/02/2019 Time: 8527-7824 SLP Time Calculation (min) (ACUTE ONLY): 16 min  Assessment / Plan / Recommendation Clinical Impression  Pt received in room for skilled ST targeting dysphagia. Pt sitting upright, HOB raised. Pt performed oral care with ST assist. Natural, adequate dentition with some missing teeth. Pt noted to have congested (non-productive) cough at rest. Pt reports he is able to occasionally expectorate phlegm, but did not do so during this session. Pt seen with ice chips and thin liquids (water) via spoon, cup and straw sips. Pt with adequate oral acceptance, adequate AP transport and swallow initiation appeared timely. Pt with immediate throat clear x2 following sips of thin liquids, no other overt s/sx aspiration. Pt seen with puree solids: adequate oral phase and no overt s/sx aspiration. ST trialed regular solids (graham cracker): pt with mildly prolonged mastication, but able to form a cohesive bolus and initiate the swallow. Pt with min diffuse oral (lingual) residue, but he was able to clear with subsequent swallows. Patient consuming two graham crackers.  Recommend upgrade to dysphagia 3 solids with thin liquids. Patient needs feeding assistance. Frequent oral care. PO when patient is upright/alert. Small bites/sips.  MBSS may be indicated to r/o silent aspiration as patient with prolonged intubation. Pt edu re: same and verbalized understanding and agreement. ST to follow as per POC.  Diet change and recommendations d/w RN, she verbalized understanding.   HPI HPI: 64yo male admitted 08/23/19 after being found unresponsive with unknown down time. PMH: cocaine, Alcohol abuse, Arthritis, Cataract, Chronic back pain, Depression, Fractures (04/2015), and GERD. Intubated 2/20-26/21. CXR = No acute cardiopulmonary disease.      SLP Plan  Continue  with current plan of care;MBS       Recommendations  Diet recommendations: Dysphagia 3 (mechanical soft);Thin liquid Medication Administration: Crushed with puree Supervision: Staff to assist with self feeding;Full supervision/cueing for compensatory strategies Compensations: Minimize environmental distractions;Slow rate;Small sips/bites Postural Changes and/or Swallow Maneuvers: Seated upright 90 degrees                Oral Care Recommendations: Oral care QID SLP Visit Diagnosis: Dysphagia, unspecified (R13.10) Plan: Continue with current plan of care;MBS       GO                Sabrie Moritz 09/02/2019, 3:30 PM  Shella Spearing, M.Ed., CCC-SLP Speech Therapy Acute Rehabilitation [phone]: Acute Rehab office [phone] - pager

## 2019-09-03 DIAGNOSIS — M1711 Unilateral primary osteoarthritis, right knee: Secondary | ICD-10-CM

## 2019-09-03 DIAGNOSIS — F17200 Nicotine dependence, unspecified, uncomplicated: Secondary | ICD-10-CM

## 2019-09-03 DIAGNOSIS — M1990 Unspecified osteoarthritis, unspecified site: Secondary | ICD-10-CM

## 2019-09-03 DIAGNOSIS — I5021 Acute systolic (congestive) heart failure: Secondary | ICD-10-CM

## 2019-09-03 LAB — BASIC METABOLIC PANEL
Anion gap: 13 (ref 5–15)
BUN: 20 mg/dL (ref 8–23)
CO2: 22 mmol/L (ref 22–32)
Calcium: 8.9 mg/dL (ref 8.9–10.3)
Chloride: 105 mmol/L (ref 98–111)
Creatinine, Ser: 0.95 mg/dL (ref 0.61–1.24)
GFR calc Af Amer: >60 mL/min (ref >60)
GFR calc non Af Amer: >60 mL/min (ref >60)
Glucose, Bld: 108 mg/dL — ABNORMAL HIGH (ref 70–99)
Potassium: 3.7 mmol/L (ref 3.5–5.1)
Sodium: 140 mmol/L (ref 135–145)

## 2019-09-03 NOTE — Progress Notes (Signed)
PT Cancellation Note  Patient Details Name: Gary Davis MRN: [id number] DOB: [birth year]   Cancelled Treatment:    Reason Eval/Treat Not Completed: Patient declined, no reason specified - pt declines PT even with max verbal encouragement, states "I am not doing anything until I get some edible food".  Richrd Sox, PT Acute Rehabilitation Services Pager [phone]  Office [phone]    Tyrone Apple D Despina Hidden 09/03/2019, 1:41 PM

## 2019-09-03 NOTE — TOC Progression Note (Signed)
Transition of Care [facility]) - Progression Note    Patient Details  Name: Gary Davis MRN: [id number] Date of Birth: Nov 25, 1955  Transition of Care [facility]) CM/SW Contact  Mearl Latin, LCSW Phone Number: 09/03/2019, 9:52 AM  Clinical Narrative:    Patient currently has no SNF bed offers due to cocaine use. CSW to continue to follow and discuss at [facility] [employer]    Expected Discharge Plan: Skilled Nursing Facility Barriers to Discharge: SNF Pending bed offer  Expected Discharge Plan and Services Expected Discharge Plan: Skilled Nursing Facility                                               Social Determinants of Health (SDOH) Interventions    Readmission Risk Interventions Readmission Risk Prevention Plan 09/01/2019  Transportation Screening Complete  PCP or Specialist Appt within 5-7 Days Complete  Home Care Screening Complete  Medication Review (RN CM) Complete  Some recent data might be hidden

## 2019-09-03 NOTE — Progress Notes (Signed)
PROGRESS NOTE    Gary Davis  YDX:[id number] DOB: [birth year] DOA: 08/23/2019 PCP: Medicine, [facility]   Brief Narrative:  64 year old with history of cocaine/alcohol use, depression initially found unresponsive by his roommate brought to the hospital requiring 1 round of CPR.  Had ROSC but subsequently intubated.  Most of the work-up was negative but his echocardiogram showed EF of 30% with moderate to severe hypokinesia seen by cardiology team.  Nuclear scan showed normal perfusion with low risk study per cardiology.  Cleared by cardiology team for discharge.  PT recommended SNF, currently [facility] team working on this.   Assessment & Plan:   Active Problems:   Tobacco use disorder   Arthritis   Osteoarthritis of right knee   Acute respiratory failure (HCC)   Cardiac arrest (HCC)   Acute systolic CHF (congestive heart failure) (HCC)  Cardiac arrest likely secondary to drug use Congestive heart failure with reduced ejection fraction, 30% with global hypokinesia -Nuclear stress test performed by cardiology on 2/20 showed low risk study.  Cleared for discharge at this time.  Would likely need a repeat echocardiogram in about 8 weeks.  Acute hypoxic/hypercapnic respiratory failure with aspiration pneumonitis 1.4 cm pulmonary nodule -MSSA in the sputum.  Has left 10th rib fracture. -Last day of Keflex today. -Would benefit from outpatient pulmonary evaluation and routine follow-up as appropriate.  Repeat outpatient CT in 3 months  Acute metabolic encephalopathy, improving -Suspect from anoxic brain injury during his cardiac arrest.  Left perinephric hematoma -Appears to be stable per renal ultrasound on 2/25  History of arthritis/cervical fusion -Pain management  Right wrist pain/deformity -Likely from bone bruising.  X-rays are negative.  DVT prophylaxis: Subcutaneous heparin Code Status: Full code Family Communication:   Disposition Plan:   Patient From=  home  Patient Anticipated D/C place= SNF  Barriers= medically stable, pending placement   Subjective: Continues to tell me he wishes to go home but I think he lacks understanding that he requires help at this time and needs skilled nursing facility make him feel stronger.  Review of Systems Otherwise negative except as per HPI, including: General: Denies fever, chills, night sweats or unintended weight loss. Resp: Denies cough, wheezing, shortness of breath. Cardiac: Denies chest pain, palpitations, orthopnea, paroxysmal nocturnal dyspnea. GI: Denies abdominal pain, nausea, vomiting, diarrhea or constipation GU: Denies dysuria, frequency, hesitancy or incontinence MS: Denies muscle aches, joint pain or swelling Neuro: Denies headache, neurologic deficits (focal weakness, numbness, tingling), abnormal gait Psych: Denies anxiety, depression, SI/HI/AVH Skin: Denies new rashes or lesions ID: Denies sick contacts, exotic exposures, travel  Examination:  General exam: Appears calm and comfortable, bilateral temporal wasting generally weak appearing Respiratory system: Clear to auscultation. Respiratory effort normal. Cardiovascular system: S1 & S2 heard, RRR. No JVD, murmurs, rubs, gallops or clicks. No pedal edema. Gastrointestinal system: Abdomen is nondistended, soft and nontender. No organomegaly or masses felt. Normal bowel sounds heard. Central nervous system: Alert and oriented. No focal neurological deficits. Extremities: Symmetric 5 x 5 power. Skin: No rashes, lesions or ulcers Psychiatry: Judgement and insight appear normal. Mood & affect appropriate.     Objective: Vitals:   09/02/19 0815 09/02/19 1752 09/02/19 1949 09/03/19 0500  BP: 134/79 114/71 (!) 113/95   Pulse: (!) 55 68 (!) 56   Resp: 20 19    Temp: 98.1 F (36.7 C) 98.7 F (37.1 C) 98.1 F (36.7 C)   TempSrc:   Oral   SpO2: 97% 95% 96%   Weight:  74.1 kg  Height:        Intake/Output Summary (Last 24  hours) at 09/03/2019 0818 Last data filed at 09/03/2019 0700 Gross per 24 hour  Intake 360 ml  Output 1100 ml  Net -740 ml   Filed Weights   09/01/19 0302 09/02/19 0500 09/03/19 0500  Weight: 75.5 kg 75.7 kg 74.1 kg     Data Reviewed:   CBC: Recent Labs  Lab 08/28/19 0302 08/29/19 0502  WBC 9.1 8.0  HGB 13.5 12.3*  HCT 40.2 36.2*  MCV 99.3 98.1  PLT 123* 137*   Basic Metabolic Panel: Recent Labs  Lab 08/29/19 0502 08/30/19 0312 08/31/19 0316 09/01/19 0239 09/03/19 0540  NA 138 142 144 143 140  K 3.4* 3.3* 3.1* 3.3* 3.7  CL 103 106 107 105 105  CO2 26 27 27 26 22   GLUCOSE 140* 113* 122* 107* 108*  BUN 19 17 15 11 20   CREATININE 0.92 1.08 1.01 0.97 0.95  CALCIUM 8.7* 8.5* 8.4* 8.5* 8.9  MG 1.7 1.8 1.8  --   --   PHOS 2.7 2.3* 3.6 3.3  --    GFR: Estimated Creatinine Clearance: 81.3 mL/min (by C-G formula based on SCr of 0.95 mg/dL). Liver Function Tests: Recent Labs  Lab 08/28/19 0302 08/29/19 0502 08/30/19 0312  AST 70* 39 33  ALT 57* 38 30  ALKPHOS 68 58 52  BILITOT 1.0 0.7 0.8  PROT 6.2* 5.6* 5.5*  ALBUMIN 2.8* 2.4* 2.3*   No results for input(s): LIPASE, AMYLASE in the last 168 hours. No results for input(s): AMMONIA in the last 168 hours. Coagulation Profile: No results for input(s): INR, PROTIME in the last 168 hours. Cardiac Enzymes: Recent Labs  Lab 08/28/19 0302 08/29/19 0502  CKTOTAL 846* 443*   BNP (last 3 results) No results for input(s): PROBNP in the last 8760 hours. HbA1C: No results for input(s): HGBA1C in the last 72 hours. CBG: Recent Labs  Lab 08/30/19 2314 08/31/19 0318 08/31/19 0748 08/31/19 1123 08/31/19 1634  GLUCAP 109* 107* 113* 125* 105*   Lipid Profile: No results for input(s): CHOL, HDL, LDLCALC, TRIG, CHOLHDL, LDLDIRECT in the last 72 hours. Thyroid Function Tests: No results for input(s): TSH, T4TOTAL, FREET4, T3FREE, THYROIDAB in the last 72 hours. Anemia Panel: No results for input(s): VITAMINB12,  FOLATE, FERRITIN, TIBC, IRON, RETICCTPCT in the last 72 hours. Sepsis Labs: No results for input(s): PROCALCITON, LATICACIDVEN in the last 168 hours.  Recent Results (from the past 240 hour(s))  Culture, respiratory     Status: None   Collection Time: 08/25/19  9:40 AM   Specimen: Tracheal Aspirate; Respiratory  Result Value Ref Range Status   Specimen Description TRACHEAL ASPIRATE  Final   Special Requests NONE  Final   Gram Stain   Final    MODERATE WBC PRESENT,BOTH PMN AND MONONUCLEAR RARE GRAM POSITIVE COCCI IN PAIRS Performed at [facility], [address] [place], [address] [place]    Culture FEW STAPHYLOCOCCUS AUREUS  Final   Report Status 08/28/2019 FINAL  Final   Organism ID, Bacteria STAPHYLOCOCCUS AUREUS  Final      Susceptibility   Staphylococcus aureus - MIC*    CIPROFLOXACIN <=0.5 SENSITIVE Sensitive     ERYTHROMYCIN <=0.25 SENSITIVE Sensitive     GENTAMICIN <=0.5 SENSITIVE Sensitive     OXACILLIN 0.5 SENSITIVE Sensitive     TETRACYCLINE <=1 SENSITIVE Sensitive     VANCOMYCIN <=0.5 SENSITIVE Sensitive     TRIMETH/SULFA <=10 SENSITIVE Sensitive  CLINDAMYCIN <=0.25 SENSITIVE Sensitive     RIFAMPIN <=0.5 SENSITIVE Sensitive     Inducible Clindamycin NEGATIVE Sensitive     * FEW STAPHYLOCOCCUS AUREUS  Culture, respiratory (non-expectorated)     Status: None   Collection Time: 08/28/19  2:37 AM   Specimen: Tracheal Aspirate; Respiratory  Result Value Ref Range Status   Specimen Description TRACHEAL ASPIRATE  Final   Special Requests Normal  Final   Gram Stain   Final    MODERATE WBC PRESENT, PREDOMINANTLY PMN MODERATE GRAM POSITIVE COCCI IN PAIRS IN CHAINS FEW GRAM NEGATIVE RODS FEW GRAM POSITIVE RODS Performed at [facility], [address] [place], [place] [address]    Culture   Final    FEW PROTEUS MIRABILIS MODERATE STAPHYLOCOCCUS AUREUS    Report Status 08/31/2019 FINAL  Final   Organism ID, Bacteria PROTEUS MIRABILIS  Final    Organism ID, Bacteria STAPHYLOCOCCUS AUREUS  Final      Susceptibility   Proteus mirabilis - MIC*    AMPICILLIN <=2 SENSITIVE Sensitive     CEFAZOLIN <=4 SENSITIVE Sensitive     CEFEPIME <=0.12 SENSITIVE Sensitive     CEFTAZIDIME <=1 SENSITIVE Sensitive     CEFTRIAXONE <=0.25 SENSITIVE Sensitive     CIPROFLOXACIN <=0.25 SENSITIVE Sensitive     GENTAMICIN <=1 SENSITIVE Sensitive     IMIPENEM 2 SENSITIVE Sensitive     TRIMETH/SULFA <=20 SENSITIVE Sensitive     AMPICILLIN/SULBACTAM <=2 SENSITIVE Sensitive     PIP/TAZO <=4 SENSITIVE Sensitive     * FEW PROTEUS MIRABILIS   Staphylococcus aureus - MIC*    CIPROFLOXACIN <=0.5 SENSITIVE Sensitive     ERYTHROMYCIN <=0.25 SENSITIVE Sensitive     GENTAMICIN <=0.5 SENSITIVE Sensitive     OXACILLIN 0.5 SENSITIVE Sensitive     TETRACYCLINE <=1 SENSITIVE Sensitive     VANCOMYCIN <=0.5 SENSITIVE Sensitive     TRIMETH/SULFA <=10 SENSITIVE Sensitive     CLINDAMYCIN <=0.25 SENSITIVE Sensitive     RIFAMPIN <=0.5 SENSITIVE Sensitive     Inducible Clindamycin NEGATIVE Sensitive     * MODERATE STAPHYLOCOCCUS AUREUS  Culture, blood (Routine X 2) w Reflex to ID Panel     Status: None   Collection Time: 08/28/19  2:52 AM   Specimen: BLOOD  Result Value Ref Range Status   Specimen Description BLOOD LEFT HAND  Final   Special Requests   Final    BOTTLES DRAWN AEROBIC AND ANAEROBIC Blood Culture results may not be optimal due to an inadequate volume of blood received in culture bottles   Culture   Final    NO GROWTH 5 DAYS Performed at [facility], [address] [place], [place] [address]    Report Status 09/02/2019 FINAL  Final  Culture, blood (Routine X 2) w Reflex to ID Panel     Status: None   Collection Time: 08/28/19  3:02 AM   Specimen: BLOOD  Result Value Ref Range Status   Specimen Description BLOOD LEFT WRIST  Final   Special Requests   Final    BOTTLES DRAWN AEROBIC AND ANAEROBIC Blood Culture adequate volume   Culture   Final     NO GROWTH 5 DAYS Performed at [facility], [address], [place], [place] [address]    Report Status 09/02/2019 FINAL  Final         Radiology Studies: No results found.      Scheduled Meds: . cephALEXin  500 mg Oral Q8H  .  Chlorhexidine Gluconate Cloth  6 each Topical Daily  . feeding supplement (NEPRO CARB STEADY)  237 mL Oral Q24H  . heparin injection (subcutaneous)  5,000 Units Subcutaneous Q8H  . pantoprazole  40 mg Oral QHS  . polyethylene glycol  17 g Oral Daily  . potassium chloride  40 mEq Oral Daily  . senna-docusate  1 tablet Per Tube Daily   Continuous Infusions: . sodium chloride Stopped (08/30/19 1659)  . sodium chloride    . sodium chloride       LOS: 11 days   Time spent= 20 mins    Joeanne Robicheaux Joline Maxcy, MD [facility]  If 7PM-7AM, please contact night-coverage  09/03/2019, 8:18 AM

## 2019-09-04 ENCOUNTER — Inpatient Hospital Stay: Admission: RE | Admit: 2019-09-04 | Payer: Self-pay | Source: Ambulatory Visit

## 2019-09-04 MED ORDER — LIP MEDEX EX OINT
TOPICAL_OINTMENT | CUTANEOUS | Status: DC | PRN
  Filled 2019-09-04: qty 7

## 2019-09-04 NOTE — Progress Notes (Signed)
  Speech Language Pathology Treatment: Dysphagia  Patient Details Name: Gary Davis MRN: [id number] DOB: February 14, 1956 Today's Date: 09/04/2019 Time: 6270-3500 SLP Time Calculation (min) (ACUTE ONLY): 43 min  Assessment / Plan / Recommendation Clinical Impression  Pt agreeable to dysphagia treatment session and eager for opportunity to progress diet texture if able. Despite missing posterior dentition he orally managed solid texture with propulsion to pharynx. Thin liquids via cup and straw produced frequent throat clears and coughs that he equates to other etiologies. He may be experiencing airway intrusion with thin. The straw was removed and a Provale cup was used which restricts amount to 5cc boluses per sips and did reduce throat clears/coughs. Cup has large bilateral handles and Payten was able to hold and manage independently. Would not recommend MBS at present given pt has stated he will not use thickener. MBS also can assess use of compensatory techniques although question his compliance. If instrumental assessment was necessary, would recommend but presently thin liquids using Provale cup recommended and upgrade to regular. Please ensure that cup is removed from cafeteria tray. No difficulty consuming pill cut in half with thin from special cup. Will continue to manage swallowing.    HPI HPI: 64 yo male admitted 08/23/19 after being found unresponsive with unknown down time. PMH: cocaine, Alcohol abuse, Arthritis, Cataract, Chronic back pain, Depression, Fractures (04/2015), and GERD. Intubated 2/20-26/21. CXR = No acute cardiopulmonary disease.      SLP Plan  Continue with current plan of care       Recommendations  Diet recommendations: Regular;Thin liquid Liquids provided via: Cup(Provale cup) Medication Administration: Whole meds with liquid Supervision: Patient able to self feed;Staff to assist with self feeding;Full supervision/cueing for compensatory  strategies Compensations: Minimize environmental distractions;Slow rate;Small sips/bites;Lingual sweep for clearance of pocketing Postural Changes and/or Swallow Maneuvers: Seated upright 90 degrees                Oral Care Recommendations: Oral care BID Follow up Recommendations: None SLP Visit Diagnosis: Dysphagia, unspecified (R13.10) Plan: Continue with current plan of care                       Royce Macadamia 09/04/2019, 10:11 AM   Breck Coons Lonell Face.Ed Nurse, children's [phone] Office [phone]

## 2019-09-04 NOTE — Progress Notes (Signed)
PROGRESS NOTE    Gary Davis  WGN:[id number] DOB: [birth year] DOA: 08/23/2019 PCP: Medicine, [facility]   Brief Narrative:  64 year old with history of cocaine/alcohol use, depression initially found unresponsive by his roommate brought to the hospital requiring 1 round of CPR.  Had ROSC but subsequently intubated.  Most of the work-up was negative but his echocardiogram showed EF of 30% with moderate to severe hypokinesia seen by cardiology team.  Nuclear scan showed normal perfusion with low risk study per cardiology.  Cleared by cardiology team for discharge.  PT recommended SNF, currently [facility] team working on this.   Assessment & Plan:   Active Problems:   Tobacco use disorder   Arthritis   Osteoarthritis of right knee   Acute respiratory failure (HCC)   Cardiac arrest (HCC)   Acute systolic CHF (congestive heart failure) (HCC)  Cardiac arrest likely secondary to drug use Congestive heart failure with reduced ejection fraction, 30% with global hypokinesia -Nuclear stress test performed by cardiology on 2/20 showed low risk study.  Cleared for discharge at this time.  Would likely need a repeat echocardiogram in about 8 weeks.  Acute hypoxic/hypercapnic respiratory failure with aspiration pneumonitis 1.4 cm pulmonary nodule -MSSA in the sputum.  Has left 10th rib fracture. -Completed course of Keflex 3/3 -Would benefit from outpatient pulmonary evaluation and routine follow-up as appropriate.  Repeat outpatient CT in 3 months  Acute metabolic encephalopathy, improving -Suspect from anoxic brain injury during his cardiac arrest.  Left perinephric hematoma -Appears to be stable per renal ultrasound on 2/25  History of arthritis/cervical fusion -Pain management  Right wrist pain/deformity -Likely from bone bruising.  X-rays are negative.  Working with speech and swallow team-plans for MBS today  DVT prophylaxis: Subcutaneous heparin Code Status: Full  code Family Communication:   Disposition Plan:   Patient From= home  Patient Anticipated D/C place= SNF  Barriers= pending placement, medically stable   Subjective: Tells me he wants to go home but I explained to him the importance of therapy and safe disposition planning.  He understands and is willing to work daily with physical and speech therapy  Review of Systems Otherwise negative except as per HPI, including: General = no fevers, chills, dizziness,  fatigue HEENT/EYES = negative for loss of vision, double vision, blurred vision,  sore throa Cardiovascular= negative for chest pain, palpitation Respiratory/lungs= negative for shortness of breath, cough, wheezing; hemoptysis,  Gastrointestinal= negative for nausea, vomiting, abdominal pain Genitourinary= negative for Dysuria MSK = Negative for arthralgia, myalgias Neurology= Negative for headache, numbness, tingling  Psychiatry= Negative for suicidal and homocidal ideation Skin= Negative for Rash  Examination: Constitutional: Not in acute distress, bilateral temporal wasting Respiratory: Clear to auscultation bilaterally Cardiovascular: Normal sinus rhythm, no rubs Abdomen: Nontender nondistended good bowel sounds Musculoskeletal: No edema noted Skin: No rashes seen Neurologic: CN 2-12 grossly intact.  And nonfocal Psychiatric: Normal judgment and insight. Alert and oriented x 3. Normal mood.     Objective: Vitals:   09/03/19 0500 09/03/19 0824 09/03/19 2100 09/04/19 0724  BP:  131/75 116/71 115/70  Pulse:  (!) 49 61 (!) 58  Resp:  16 17 16   Temp:  (!) 97.5 F (36.4 C) 97.9 F (36.6 C) 97.9 F (36.6 C)  TempSrc:   Oral   SpO2:  97% 95% 97%  Weight: 74.1 kg     Height:        Intake/Output Summary (Last 24 hours) at 09/04/2019 1049 Last data filed at 09/04/2019  0118 Gross per 24 hour  Intake --  Output 800 ml  Net -800 ml   Filed Weights   09/01/19 0302 09/02/19 0500 09/03/19 0500  Weight: 75.5 kg 75.7 kg  74.1 kg     Data Reviewed:   CBC: Recent Labs  Lab 08/29/19 0502  WBC 8.0  HGB 12.3*  HCT 36.2*  MCV 98.1  PLT 137*   Basic Metabolic Panel: Recent Labs  Lab 08/29/19 0502 08/30/19 0312 08/31/19 0316 09/01/19 0239 09/03/19 0540  NA 138 142 144 143 140  K 3.4* 3.3* 3.1* 3.3* 3.7  CL 103 106 107 105 105  CO2 26 27 27 26 22   GLUCOSE 140* 113* 122* 107* 108*  BUN 19 17 15 11 20   CREATININE 0.92 1.08 1.01 0.97 0.95  CALCIUM 8.7* 8.5* 8.4* 8.5* 8.9  MG 1.7 1.8 1.8  --   --   PHOS 2.7 2.3* 3.6 3.3  --    GFR: Estimated Creatinine Clearance: 81.3 mL/min (by C-G formula based on SCr of 0.95 mg/dL). Liver Function Tests: Recent Labs  Lab 08/29/19 0502 08/30/19 0312  AST 39 33  ALT 38 30  ALKPHOS 58 52  BILITOT 0.7 0.8  PROT 5.6* 5.5*  ALBUMIN 2.4* 2.3*   No results for input(s): LIPASE, AMYLASE in the last 168 hours. No results for input(s): AMMONIA in the last 168 hours. Coagulation Profile: No results for input(s): INR, PROTIME in the last 168 hours. Cardiac Enzymes: Recent Labs  Lab 08/29/19 0502  CKTOTAL 443*   BNP (last 3 results) No results for input(s): PROBNP in the last 8760 hours. HbA1C: No results for input(s): HGBA1C in the last 72 hours. CBG: Recent Labs  Lab 08/30/19 2314 08/31/19 0318 08/31/19 0748 08/31/19 1123 08/31/19 1634  GLUCAP 109* 107* 113* 125* 105*   Lipid Profile: No results for input(s): CHOL, HDL, LDLCALC, TRIG, CHOLHDL, LDLDIRECT in the last 72 hours. Thyroid Function Tests: No results for input(s): TSH, T4TOTAL, FREET4, T3FREE, THYROIDAB in the last 72 hours. Anemia Panel: No results for input(s): VITAMINB12, FOLATE, FERRITIN, TIBC, IRON, RETICCTPCT in the last 72 hours. Sepsis Labs: No results for input(s): PROCALCITON, LATICACIDVEN in the last 168 hours.  Recent Results (from the past 240 hour(s))  Culture, respiratory (non-expectorated)     Status: None   Collection Time: 08/28/19  2:37 AM   Specimen: Tracheal  Aspirate; Respiratory  Result Value Ref Range Status   Specimen Description TRACHEAL ASPIRATE  Final   Special Requests Normal  Final   Gram Stain   Final    MODERATE WBC PRESENT, PREDOMINANTLY PMN MODERATE GRAM POSITIVE COCCI IN PAIRS IN CHAINS FEW GRAM NEGATIVE RODS FEW GRAM POSITIVE RODS Performed at [facility], [address] [place], [address] [place]    Culture   Final    FEW PROTEUS MIRABILIS MODERATE STAPHYLOCOCCUS AUREUS    Report Status 08/31/2019 FINAL  Final   Organism ID, Bacteria PROTEUS MIRABILIS  Final   Organism ID, Bacteria STAPHYLOCOCCUS AUREUS  Final      Susceptibility   Proteus mirabilis - MIC*    AMPICILLIN <=2 SENSITIVE Sensitive     CEFAZOLIN <=4 SENSITIVE Sensitive     CEFEPIME <=0.12 SENSITIVE Sensitive     CEFTAZIDIME <=1 SENSITIVE Sensitive     CEFTRIAXONE <=0.25 SENSITIVE Sensitive     CIPROFLOXACIN <=0.25 SENSITIVE Sensitive     GENTAMICIN <=1 SENSITIVE Sensitive     IMIPENEM 2 SENSITIVE Sensitive     TRIMETH/SULFA <=20 SENSITIVE Sensitive  AMPICILLIN/SULBACTAM <=2 SENSITIVE Sensitive     PIP/TAZO <=4 SENSITIVE Sensitive     * FEW PROTEUS MIRABILIS   Staphylococcus aureus - MIC*    CIPROFLOXACIN <=0.5 SENSITIVE Sensitive     ERYTHROMYCIN <=0.25 SENSITIVE Sensitive     GENTAMICIN <=0.5 SENSITIVE Sensitive     OXACILLIN 0.5 SENSITIVE Sensitive     TETRACYCLINE <=1 SENSITIVE Sensitive     VANCOMYCIN <=0.5 SENSITIVE Sensitive     TRIMETH/SULFA <=10 SENSITIVE Sensitive     CLINDAMYCIN <=0.25 SENSITIVE Sensitive     RIFAMPIN <=0.5 SENSITIVE Sensitive     Inducible Clindamycin NEGATIVE Sensitive     * MODERATE STAPHYLOCOCCUS AUREUS  Culture, blood (Routine X 2) w Reflex to ID Panel     Status: None   Collection Time: 08/28/19  2:52 AM   Specimen: BLOOD  Result Value Ref Range Status   Specimen Description BLOOD LEFT HAND  Final   Special Requests   Final    BOTTLES DRAWN AEROBIC AND ANAEROBIC Blood Culture results may not be  optimal due to an inadequate volume of blood received in culture bottles   Culture   Final    NO GROWTH 5 DAYS Performed at [facility], [place] [address], [place], [place] [address]    Report Status 09/02/2019 FINAL  Final  Culture, blood (Routine X 2) w Reflex to ID Panel     Status: None   Collection Time: 08/28/19  3:02 AM   Specimen: BLOOD  Result Value Ref Range Status   Specimen Description BLOOD LEFT WRIST  Final   Special Requests   Final    BOTTLES DRAWN AEROBIC AND ANAEROBIC Blood Culture adequate volume   Culture   Final    NO GROWTH 5 DAYS Performed at [facility], [place] [address] [place], [place] [address]    Report Status 09/02/2019 FINAL  Final         Radiology Studies: No results found.      Scheduled Meds: . Chlorhexidine Gluconate Cloth  6 each Topical Daily  . feeding supplement (NEPRO CARB STEADY)  237 mL Oral Q24H  . heparin injection (subcutaneous)  5,000 Units Subcutaneous Q8H  . pantoprazole  40 mg Oral QHS  . polyethylene glycol  17 g Oral Daily  . potassium chloride  40 mEq Oral Daily  . senna-docusate  1 tablet Per Tube Daily   Continuous Infusions: . sodium chloride Stopped (08/30/19 1659)  . sodium chloride    . sodium chloride       LOS: 12 days   Time spent= 20 mins    Iyanna Drummer Arsenio Loader, MD [facility]  If 7PM-7AM, please contact night-coverage  09/04/2019, 10:49 AM

## 2019-09-04 NOTE — Progress Notes (Addendum)
Occupational Therapy Treatment Patient Details Name: Gary Davis MRN: [id number] DOB: 03/04/1956 Today's Date: 09/04/2019    History of present illness Gary Davis is a 64 y.o. male with a hx of cocaine and alcohol abuse, arthritis, chronic back pain, depression and GERD who is being seen today for the evaluation of elevated troponin and atrial fibrillation status post cardiac arrest at the request of Dr. Renford Dills.  Extubated 2/26.     OT comments  Patient in bed on arrival and initially more willing to participate than prior visits, though still needed max encouragement to progress.  He required min assist of 2 with bed mobility and stand.  After stand 1 min patient sat back down complaining of headache and refused to participate in rest of therapy.  Patient completed face wash in bed with cool cloth and set up.  Attempted to work on fine motor coordination with button pushing on remote, but patient stated he could not see remote in hand due to headache.  Will continue to follow with OT acutely to address the deficits listed below.    Follow Up Recommendations  SNF;Supervision/Assistance - 24 hour    Equipment Recommendations  None recommended by OT    Recommendations for Other Services      Precautions / Restrictions Precautions Precautions: Fall Restrictions Weight Bearing Restrictions: No       Mobility Bed Mobility Overal bed mobility: Needs Assistance Bed Mobility: Supine to Sit     Supine to sit: Min assist        Transfers Overall transfer level: Needs assistance Equipment used: Rolling walker (2 wheeled) Transfers: Sit to/from Stand Sit to Stand: Min assist;+2 safety/equipment              Balance Overall balance assessment: Needs assistance Sitting-balance support: Bilateral upper extremity supported;Feet supported Sitting balance-Leahy Scale: Fair     Standing balance support: Bilateral upper extremity supported;During functional  activity Standing balance-Leahy Scale: Poor                             ADL either performed or assessed with clinical judgement   ADL Overall ADL's : Needs assistance/impaired     Grooming: Wash/dry face;Set up;Bed level                               Functional mobility during ADLs: Minimal assistance;+2 for safety/equipment;Rolling walker General ADL Comments: Only completed 1 sit<>stand, did not ambulate or transfer     Vision       Perception     Praxis      Cognition Arousal/Alertness: Awake/alert Behavior During Therapy: Agitated;Anxious Overall Cognitive Status: Impaired/Different from baseline Area of Impairment: Following commands;Attention;Safety/judgement;Awareness;Problem solving                   Current Attention Level: Alternating   Following Commands: Follows one step commands inconsistently;Follows one step commands with increased time Safety/Judgement: Decreased awareness of safety;Decreased awareness of deficits   Problem Solving: Slow processing;Decreased initiation;Difficulty sequencing;Requires verbal cues General Comments: Patient very adament about leaving and not wanting to participate        Exercises     Shoulder Instructions       General Comments      Pertinent Vitals/ Pain       Pain Assessment: Faces Faces Pain Scale: Hurts even more Pain Location: headache Pain Descriptors / Indicators: Grimacing;Headache  Pain Intervention(s): Limited activity within patient's tolerance;Monitored during session;Other (comment)(Cool washcloth applied)  Home Living                                          Prior Functioning/Environment              Frequency  Min 2X/week        Progress Toward Goals  OT Goals(current goals can now be found in the care plan section)  Progress towards OT goals: Progressing toward goals  Acute Rehab OT Goals Patient Stated Goal: to get out of  hospital OT Goal Formulation: With patient Time For Goal Achievement: 09/15/19 Potential to Achieve Goals: Good  Plan Discharge plan remains appropriate    Co-evaluation    PT/OT/SLP Co-Evaluation/Treatment: Yes Reason for Co-Treatment: Necessary to address cognition/behavior during functional activity;To address functional/ADL transfers;For patient/therapist safety   OT goals addressed during session: ADL's and self-care      AM-PAC OT "6 Clicks" Daily Activity     Outcome Measure   Help from another person eating meals?: A Little Help from another person taking care of personal grooming?: A Lot Help from another person toileting, which includes using toliet, bedpan, or urinal?: Total Help from another person bathing (including washing, rinsing, drying)?: A Lot Help from another person to put on and taking off regular upper body clothing?: A Lot Help from another person to put on and taking off regular lower body clothing?: A Lot 6 Click Score: 12    End of Session Equipment Utilized During Treatment: Rolling walker  OT Visit Diagnosis: Unsteadiness on feet (R26.81);Muscle weakness (generalized) (M62.81);Pain Pain - part of body: (Head)   Activity Tolerance Patient limited by fatigue;Patient limited by pain   Patient Left in bed;with call bell/phone within reach;with bed alarm set   Nurse Communication Mobility status        Time: 3151-7616 OT Time Calculation (min): 16 min  Charges: OT General Charges $OT Visit: 1 Visit OT Treatments $Self Care/Home Management : 8-22 mins  August Luz, OTR/L    Phylliss Bob 09/04/2019, 12:06 PM

## 2019-09-04 NOTE — Progress Notes (Signed)
Physical Therapy Treatment Patient Details Name: Gary Davis MRN: [id number] DOB: 1955-12-31 Today's Date: 09/04/2019    History of Present Illness Gary Davis is a 64 y.o. male with a hx of cocaine and alcohol abuse, arthritis, chronic back pain, depression and GERD who is being seen today for the evaluation of elevated troponin and atrial fibrillation status post cardiac arrest at the request of Dr. Tawanna Solo.  Extubated 2/26.      PT Comments     Pt begrudgingly agreeable to EOB and OOB activity this session. Pt required min assist for bed mobility and transfer to standing, but pt unwilling to progress to standing exercise or ambulation this session. Pt states "don't make me mad, I have a headache" when PT and OT tried to progress pt mobility. PT to continue to follow acutely.    Follow Up Recommendations  SNF;Supervision/Assistance - 24 hour     Equipment Recommendations  Other (comment)(TBA)    Recommendations for Other Services       Precautions / Restrictions Precautions Precautions: Fall Restrictions Weight Bearing Restrictions: No    Mobility  Bed Mobility Overal bed mobility: Needs Assistance Bed Mobility: Supine to Sit     Supine to sit: Min assist     General bed mobility comments: min assist for scooting to EOB, LE management to EOB. Min guard for return to supine.  Transfers Overall transfer level: Needs assistance Equipment used: Rolling walker (2 wheeled) Transfers: Sit to/from Stand Sit to Stand: Min assist;+2 safety/equipment         General transfer comment: Min assist +2 for power up assist, steadying. +2 for safety.  Ambulation/Gait             General Gait Details: Pt refused   Stairs             Wheelchair Mobility    Modified Rankin (Stroke Patients Only)       Balance Overall balance assessment: Needs assistance Sitting-balance support: Bilateral upper extremity supported;Feet supported Sitting  balance-Leahy Scale: Fair Sitting balance - Comments: able to sit EOB without PT assist   Standing balance support: Bilateral upper extremity supported;During functional activity Standing balance-Leahy Scale: Poor Standing balance comment: reliant on RW                            Cognition Arousal/Alertness: Awake/alert Behavior During Therapy: Agitated;Anxious Overall Cognitive Status: Impaired/Different from baseline Area of Impairment: Following commands;Attention;Safety/judgement;Awareness;Problem solving                   Current Attention Level: Alternating   Following Commands: Follows one step commands inconsistently;Follows one step commands with increased time Safety/Judgement: Decreased awareness of safety;Decreased awareness of deficits   Problem Solving: Slow processing;Decreased initiation;Difficulty sequencing;Requires verbal cues General Comments: Patient very adament about leaving and not wanting to participate, after sit<>stand pt states "now you are asking too much, don't make me angry"      Exercises      General Comments        Pertinent Vitals/Pain Pain Assessment: Faces Faces Pain Scale: Hurts even more Pain Location: headache Pain Descriptors / Indicators: Grimacing;Headache Pain Intervention(s): Limited activity within patient's tolerance;Monitored during session;Repositioned    Home Living                      Prior Function            PT Goals (current goals can  now be found in the care plan section) Acute Rehab PT Goals Patient Stated Goal: to get out of hospital PT Goal Formulation: With patient Time For Goal Achievement: 09/13/19 Potential to Achieve Goals: Good Progress towards PT goals: Progressing toward goals    Frequency    Min 2X/week      PT Plan Current plan remains appropriate    Co-evaluation PT/OT/SLP Co-Evaluation/Treatment: Yes Reason for Co-Treatment: Necessary to address  cognition/behavior during functional activity;For patient/therapist safety;To address functional/ADL transfers PT goals addressed during session: Mobility/safety with mobility;Balance OT goals addressed during session: ADL's and self-care      AM-PAC PT "6 Clicks" Mobility   Outcome Measure  Help needed turning from your back to your side while in a flat bed without using bedrails?: A Little Help needed moving from lying on your back to sitting on the side of a flat bed without using bedrails?: A Little Help needed moving to and from a bed to a chair (including a wheelchair)?: A Little Help needed standing up from a chair using your arms (e.g., wheelchair or bedside chair)?: A Little Help needed to walk in hospital room?: A Lot Help needed climbing 3-5 steps with a railing? : Total 6 Click Score: 15    End of Session Equipment Utilized During Treatment: Gait belt Activity Tolerance: Patient limited by fatigue;Treatment limited secondary to agitation Patient left: in chair;with call bell/phone within reach;with chair alarm set Nurse Communication: Mobility status PT Visit Diagnosis: Unsteadiness on feet (R26.81);Muscle weakness (generalized) (M62.81)     Time: 4403-4742 PT Time Calculation (min) (ACUTE ONLY): 17 min  Charges:   N/a - visit only                     Kahlen Boyde E, PT Acute Rehabilitation Services Pager [phone]  Office [phone]    Claudina Oliphant D Roel Douthat 09/04/2019, 1:01 PM

## 2019-09-05 LAB — GLUCOSE, CAPILLARY
Glucose-Capillary: 110 mg/dL — ABNORMAL HIGH (ref 70–99)
Glucose-Capillary: 98 mg/dL (ref 70–99)
Glucose-Capillary: 92 mg/dL (ref 70–99)

## 2019-09-05 NOTE — Progress Notes (Signed)
  Speech Language Pathology Treatment: Dysphagia  Patient Details Name: Gary Davis MRN: [id number] DOB: [birth year] Today's Date: 09/05/2019 Time: 9528-4132 SLP Time Calculation (min) (ACUTE ONLY): 10 min  Assessment / Plan / Recommendation Clinical Impression  Pt seen at bedside for continued education regarding safe swallow precautions. Pt declined po trials at this time. SLP provided education regarding the importance of adherence to posted precautions- specifically, use of provale cup with liquids, and NO straw use. Pt reported he doesn't have "much use for that", referring to the provale cup, but when he uses it he gets along fine. Pt had an empty water cup with straw on bedside table. SLP reiterated the importance of using the provale cup to limit bolus size and minimize aspiration risk, and avoid straw use. Safe swallow precautions posted at [facility]. Pt was encouraged to use provale cup with all thin liquid intake. Pt reports he is ready to go home. Will continue education re: safe swallow precautions and strategies.   HPI HPI: 64 yo male admitted 08/23/19 after being found unresponsive with unknown down time. PMH: cocaine, Alcohol abuse, Arthritis, Cataract, Chronic back pain, Depression, Fractures (04/2015), and GERD. Intubated 2/20-26/21. CXR = No acute cardiopulmonary disease.      SLP Plan  Continue with current plan of care       Recommendations  Diet recommendations: Regular;Thin liquid Liquids provided via: Cup;No straw(Provale Cup) Medication Administration: Whole meds with liquid Supervision: Patient able to self feed;Staff to assist with self feeding;Full supervision/cueing for compensatory strategies Compensations: Minimize environmental distractions;Slow rate;Small sips/bites;Lingual sweep for clearance of pocketing Postural Changes and/or Swallow Maneuvers: Seated upright 90 degrees                Oral Care Recommendations: Oral care BID Follow up  Recommendations: None SLP Visit Diagnosis: Dysphagia, unspecified (R13.10) Plan: Continue with current plan of care       GO              Terriana Barreras B. Murvin Natal, [facility], CCC-SLP Speech Language Pathologist Office: [phone] Pager: [phone]  Leigh Aurora 09/05/2019, 9:21 AM

## 2019-09-05 NOTE — Progress Notes (Signed)
PROGRESS NOTE    Gary Davis  HER:[id number] DOB: 06-Nov-1955 DOA: 08/23/2019 PCP: Medicine, [facility]   Brief Narrative:  64 year old with history of cocaine/alcohol use, depression initially found unresponsive by his roommate brought to the hospital requiring 1 round of CPR.  Had ROSC but subsequently intubated.  Most of the work-up was negative but his echocardiogram showed EF of 30% with moderate to severe hypokinesia seen by cardiology team.  Nuclear scan showed normal perfusion with low risk study per cardiology.  Cleared by cardiology team for discharge.  PT recommended SNF, currently [facility] team working on this.   Assessment & Plan:   Active Problems:   Tobacco use disorder   Arthritis   Osteoarthritis of right knee   Acute respiratory failure (HCC)   Cardiac arrest (HCC)   Acute systolic CHF (congestive heart failure) (HCC)  Cardiac arrest likely secondary to drug use Congestive heart failure with reduced ejection fraction, 30% with global hypokinesia -Nuclear stress test performed by cardiology on 2/20 showed low risk study.  Cleared for discharge at this time.  Consider repeating echocardiogram in 6-8 weeks  Acute hypoxic/hypercapnic respiratory failure with aspiration pneumonitis, stable 1.4 cm pulmonary nodule -MSSA in the sputum.  Has left 10th rib fracture. -Completed course of Keflex 3/3 -Would benefit from outpatient pulmonary evaluation and routine follow-up as appropriate.  Repeat outpatient CT in 3 months  Acute metabolic encephalopathy, improving -Suspect from anoxic brain injury during his cardiac arrest.  Left perinephric hematoma -Appears to be stable per renal ultrasound on 2/25  History of arthritis/cervical fusion -Pain management  Right wrist pain/deformity -Likely from bone bruising.  X-rays are negative.  Working with speech and swallow team-recommending regular diet with thin liquids  DVT prophylaxis: Subcutaneous  heparin Code Status: Full code Family Communication:   Disposition Plan:   Patient From= home  Patient Anticipated D/C place= SNF  Barriers= pending placement, medically stable.  Patient has very poor judgment about what is going on, unsafe to be discharged home.   Subjective: No complaints, continues to tell me he wants to leave the hospital.  Review of Systems Otherwise negative except as per HPI, including: General = no fevers, chills, dizziness,  fatigue HEENT/EYES = negative for loss of vision, double vision, blurred vision,  sore throa Cardiovascular= negative for chest pain, palpitation Respiratory/lungs= negative for shortness of breath, cough, wheezing; hemoptysis,  Gastrointestinal= negative for nausea, vomiting, abdominal pain Genitourinary= negative for Dysuria MSK = Negative for arthralgia, myalgias Neurology= Negative for headache, numbness, tingling  Psychiatry= Negative for suicidal and homocidal ideation Skin= Negative for Rash   Examination: Constitutional: Not in acute distress, bilateral temporal wasting Respiratory: Clear to auscultation bilaterally Cardiovascular: Normal sinus rhythm, no rubs Abdomen: Nontender nondistended good bowel sounds Musculoskeletal: No edema noted Skin: No rashes seen Neurologic: CN 2-12 grossly intact.  And nonfocal Psychiatric: Poor judgment and insight.  Alert awake oriented X2-3     Objective: Vitals:   09/04/19 0724 09/04/19 1544 09/04/19 2100 09/05/19 0742  BP: 115/70 (!) 123/106 135/86 120/77  Pulse: (!) 58 74 66 (!) 57  Resp: 16 16 18 18   Temp: 97.9 F (36.6 C) 98.5 F (36.9 C) 98.3 F (36.8 C) 98.3 F (36.8 C)  TempSrc:   Oral Oral  SpO2: 97% 98% 98% 97%  Weight:      Height:        Intake/Output Summary (Last 24 hours) at 09/05/2019 1007 Last data filed at 09/05/2019 0900 Gross per 24 hour  Intake 240 ml  Output 1050 ml  Net -810 ml   Filed Weights   09/01/19 0302 09/02/19 0500 09/03/19 0500   Weight: 75.5 kg 75.7 kg 74.1 kg     Data Reviewed:   CBC: No results for input(s): WBC, NEUTROABS, HGB, HCT, MCV, PLT in the last 168 hours. Basic Metabolic Panel: Recent Labs  Lab 08/30/19 0312 08/31/19 0316 09/01/19 0239 09/03/19 0540  NA 142 144 143 140  K 3.3* 3.1* 3.3* 3.7  CL 106 107 105 105  CO2 27 27 26 22   GLUCOSE 113* 122* 107* 108*  BUN 17 15 11 20   CREATININE 1.08 1.01 0.97 0.95  CALCIUM 8.5* 8.4* 8.5* 8.9  MG 1.8 1.8  --   --   PHOS 2.3* 3.6 3.3  --    GFR: Estimated Creatinine Clearance: 81.3 mL/min (by C-G formula based on SCr of 0.95 mg/dL). Liver Function Tests: Recent Labs  Lab 08/30/19 0312  AST 33  ALT 30  ALKPHOS 52  BILITOT 0.8  PROT 5.5*  ALBUMIN 2.3*   No results for input(s): LIPASE, AMYLASE in the last 168 hours. No results for input(s): AMMONIA in the last 168 hours. Coagulation Profile: No results for input(s): INR, PROTIME in the last 168 hours. Cardiac Enzymes: No results for input(s): CKTOTAL, CKMB, CKMBINDEX, TROPONINI in the last 168 hours. BNP (last 3 results) No results for input(s): PROBNP in the last 8760 hours. HbA1C: No results for input(s): HGBA1C in the last 72 hours. CBG: Recent Labs  Lab 08/31/19 0318 08/31/19 0748 08/31/19 1123 08/31/19 1634 09/05/19 0750  GLUCAP 107* 113* 125* 105* 110*   Lipid Profile: No results for input(s): CHOL, HDL, LDLCALC, TRIG, CHOLHDL, LDLDIRECT in the last 72 hours. Thyroid Function Tests: No results for input(s): TSH, T4TOTAL, FREET4, T3FREE, THYROIDAB in the last 72 hours. Anemia Panel: No results for input(s): VITAMINB12, FOLATE, FERRITIN, TIBC, IRON, RETICCTPCT in the last 72 hours. Sepsis Labs: No results for input(s): PROCALCITON, LATICACIDVEN in the last 168 hours.  Recent Results (from the past 240 hour(s))  Culture, respiratory (non-expectorated)     Status: None   Collection Time: 08/28/19  2:37 AM   Specimen: Tracheal Aspirate; Respiratory  Result Value Ref  Range Status   Specimen Description TRACHEAL ASPIRATE  Final   Special Requests Normal  Final   Gram Stain   Final    MODERATE WBC PRESENT, PREDOMINANTLY PMN MODERATE GRAM POSITIVE COCCI IN PAIRS IN CHAINS FEW GRAM NEGATIVE RODS FEW GRAM POSITIVE RODS Performed at [facility], [address] [place], [address] [place]    Culture   Final    FEW PROTEUS MIRABILIS MODERATE STAPHYLOCOCCUS AUREUS    Report Status 08/31/2019 FINAL  Final   Organism ID, Bacteria PROTEUS MIRABILIS  Final   Organism ID, Bacteria STAPHYLOCOCCUS AUREUS  Final      Susceptibility   Proteus mirabilis - MIC*    AMPICILLIN <=2 SENSITIVE Sensitive     CEFAZOLIN <=4 SENSITIVE Sensitive     CEFEPIME <=0.12 SENSITIVE Sensitive     CEFTAZIDIME <=1 SENSITIVE Sensitive     CEFTRIAXONE <=0.25 SENSITIVE Sensitive     CIPROFLOXACIN <=0.25 SENSITIVE Sensitive     GENTAMICIN <=1 SENSITIVE Sensitive     IMIPENEM 2 SENSITIVE Sensitive     TRIMETH/SULFA <=20 SENSITIVE Sensitive     AMPICILLIN/SULBACTAM <=2 SENSITIVE Sensitive     PIP/TAZO <=4 SENSITIVE Sensitive     * FEW PROTEUS MIRABILIS   Staphylococcus aureus - MIC*  CIPROFLOXACIN <=0.5 SENSITIVE Sensitive     ERYTHROMYCIN <=0.25 SENSITIVE Sensitive     GENTAMICIN <=0.5 SENSITIVE Sensitive     OXACILLIN 0.5 SENSITIVE Sensitive     TETRACYCLINE <=1 SENSITIVE Sensitive     VANCOMYCIN <=0.5 SENSITIVE Sensitive     TRIMETH/SULFA <=10 SENSITIVE Sensitive     CLINDAMYCIN <=0.25 SENSITIVE Sensitive     RIFAMPIN <=0.5 SENSITIVE Sensitive     Inducible Clindamycin NEGATIVE Sensitive     * MODERATE STAPHYLOCOCCUS AUREUS  Culture, blood (Routine X 2) w Reflex to ID Panel     Status: None   Collection Time: 08/28/19  2:52 AM   Specimen: BLOOD  Result Value Ref Range Status   Specimen Description BLOOD LEFT HAND  Final   Special Requests   Final    BOTTLES DRAWN AEROBIC AND ANAEROBIC Blood Culture results may not be optimal due to an inadequate volume of blood  received in culture bottles   Culture   Final    NO GROWTH 5 DAYS Performed at [facility], [address] [place], [place] [address]    Report Status 09/02/2019 FINAL  Final  Culture, blood (Routine X 2) w Reflex to ID Panel     Status: None   Collection Time: 08/28/19  3:02 AM   Specimen: BLOOD  Result Value Ref Range Status   Specimen Description BLOOD LEFT WRIST  Final   Special Requests   Final    BOTTLES DRAWN AEROBIC AND ANAEROBIC Blood Culture adequate volume   Culture   Final    NO GROWTH 5 DAYS Performed at [facility], [address], [place], [place] [address]    Report Status 09/02/2019 FINAL  Final         Radiology Studies: No results found.      Scheduled Meds: . Chlorhexidine Gluconate Cloth  6 each Topical Daily  . feeding supplement (NEPRO CARB STEADY)  237 mL Oral Q24H  . heparin injection (subcutaneous)  5,000 Units Subcutaneous Q8H  . pantoprazole  40 mg Oral QHS  . polyethylene glycol  17 g Oral Daily  . potassium chloride  40 mEq Oral Daily  . senna-docusate  1 tablet Per Tube Daily   Continuous Infusions: . sodium chloride Stopped (08/30/19 1659)  . sodium chloride    . sodium chloride       LOS: 13 days   Time spent= 20 mins    May Ozment Joline Maxcy, MD [facility]  If 7PM-7AM, please contact night-coverage  09/05/2019, 10:07 AM

## 2019-09-05 NOTE — TOC Progression Note (Signed)
Transition of Care [facility]) - Progression Note    Patient Details  Name: Gary Davis MRN: [id number] Date of Birth: 06/26/1956  Transition of Care [facility]) CM/SW Contact  Mearl Latin, LCSW Phone Number: 09/05/2019, 4:58 PM  Clinical Narrative:    Patient continues not to have SNF bed offers.    Expected Discharge Plan: Skilled Nursing Facility Barriers to Discharge: SNF Pending bed offer  Expected Discharge Plan and Services Expected Discharge Plan: Skilled Nursing Facility                                               Social Determinants of Health (SDOH) Interventions    Readmission Risk Interventions Readmission Risk Prevention Plan 09/01/2019  Transportation Screening Complete  PCP or Specialist Appt within 5-7 Days Complete  Home Care Screening Complete  Medication Review (RN CM) Complete  Some recent data might be hidden

## 2019-09-06 LAB — GLUCOSE, CAPILLARY
Glucose-Capillary: 105 mg/dL — ABNORMAL HIGH (ref 70–99)
Glucose-Capillary: 129 mg/dL — ABNORMAL HIGH (ref 70–99)
Glucose-Capillary: 110 mg/dL — ABNORMAL HIGH (ref 70–99)

## 2019-09-06 NOTE — Progress Notes (Signed)
Foley removed at 1100 per order. Patient has yet to void. Bladder scan is 396 ml. MD Nelson Chimes, Ankit paged and notified.

## 2019-09-06 NOTE — Progress Notes (Addendum)
PROGRESS NOTE    Gary Davis  CWC:[id number] DOB: 1956-02-19 DOA: 08/23/2019 PCP: Medicine, [facility]   Brief Narrative:  64 year old with history of cocaine/alcohol use, depression initially found unresponsive by his roommate brought to the hospital requiring 1 round of CPR.  Had ROSC but subsequently intubated.  Most of the work-up was negative but his echocardiogram showed EF of 30% with moderate to severe hypokinesia seen by cardiology team.  Nuclear scan showed normal perfusion with low risk study per cardiology.  Cleared by cardiology team for discharge.  PT recommended SNF, currently [facility] team working on this.   Assessment & Plan:   Active Problems:   Tobacco use disorder   Arthritis   Osteoarthritis of right knee   Acute respiratory failure (HCC)   Cardiac arrest (HCC)   Acute systolic CHF (congestive heart failure) (HCC)  Cardiac arrest likely secondary to drug use Congestive heart failure with reduced ejection fraction, 30% with global hypokinesia -Nuclear stress test performed by cardiology on 2/20 showed low risk study.  Cleared for discharge at this time.  Consider repeating echocardiogram in 6-8 weeks  Acute hypoxic/hypercapnic respiratory failure with aspiration pneumonitis, stable 1.4 cm pulmonary nodule -MSSA in the sputum.  Has left 10th rib fracture. -Completed Keflex 3/3 -Would benefit from outpatient pulmonary evaluation and routine follow-up as appropriate.  Repeat outpatient CT in 3 months  Acute metabolic encephalopathy, greatly improved -Suspect from anoxic brain injury during his cardiac arrest. -We will get also second opinion from psychiatry regarding his mentation and capacity.  Left perinephric hematoma -Appears to be stable per renal ultrasound on 2/25  History of arthritis/cervical fusion -Pain management  Right wrist pain/deformity -Likely from bone bruising.  X-rays are negative.  Working with speech and swallow  team-recommending regular diet with thin liquids  PT/OT evaluation performed.  SNF recommended.  SNF appropriate as the patient has received 3 days of hospital care (or the 3 days stay Has been made by the patient's insurance company) and is felt to need rehab services to restore this patient to their prior level of function to achieve safe transition back to home care.  This patient needs rehab services for at least 5 days/week and skilled nursing services daily to facilitate this transition.  Rehab is been requested as the most appropriate discharge option for this patient and is not felt to be custodial care as evidenced by his previous independence as he lives by himself at his house.  Currently with his improvement in his mentation, he would be a great rehabilitation candidate.   DVT prophylaxis: Subcutaneous heparin Code Status: Full code Family Communication: Spoke with his brother.  Disposition Plan:   Patient From= home  Patient Anticipated D/C place= SNF  Barriers= pending placement, medically stable.  Patient has very poor judgment about what is going on, unsafe to be discharged home.   Subjective: Patient continues to insist he wants to leave the hospital and go home.  He is alert awake oriented X4.  He is able to tell me that if something happens to him he can die and he is responsible for it.  Spoke with his brother, he doesn't think patient is able to take care of himself at home.   Review of Systems Otherwise negative except as per HPI, including: General = no fevers, chills, dizziness,  fatigue HEENT/EYES = negative for loss of vision, double vision, blurred vision,  sore throa Cardiovascular= negative for chest pain, palpitation Respiratory/lungs= negative for shortness of breath,  cough, wheezing; hemoptysis,  Gastrointestinal= negative for nausea, vomiting, abdominal pain Genitourinary= negative for Dysuria MSK = Negative for arthralgia, myalgias Neurology= Negative for  headache, numbness, tingling  Psychiatry= Negative for suicidal and homocidal ideation Skin= Negative for Rash   Examination: Constitutional: Not in acute distress Respiratory: Clear to auscultation bilaterally Cardiovascular: Normal sinus rhythm, no rubs Abdomen: Nontender nondistended good bowel sounds Musculoskeletal: No edema noted Skin: No rashes seen Neurologic: CN 2-12 grossly intact.  And nonfocal Psychiatric: Normal judgment and insight. Alert and oriented x 3. Normal mood.    Objective: Vitals:   09/05/19 0742 09/05/19 1558 09/05/19 2114 09/06/19 0836  BP: 120/77 120/70 106/67 93/66  Pulse: (!) 57 61 67   Resp: 18 18  20   Temp: 98.3 F (36.8 C) 98.8 F (37.1 C) 98.7 F (37.1 C) (!) 97.5 F (36.4 C)  TempSrc: Oral   Oral  SpO2: 97% 99% 98% 98%  Weight:      Height:        Intake/Output Summary (Last 24 hours) at 09/06/2019 0949 Last data filed at 09/06/2019 11/06/2019 Gross per 24 hour  Intake --  Output 1025 ml  Net -1025 ml   Filed Weights   09/01/19 0302 09/02/19 0500 09/03/19 0500  Weight: 75.5 kg 75.7 kg 74.1 kg     Data Reviewed:   CBC: No results for input(s): WBC, NEUTROABS, HGB, HCT, MCV, PLT in the last 168 hours. Basic Metabolic Panel: Recent Labs  Lab 08/31/19 0316 09/01/19 0239 09/03/19 0540  NA 144 143 140  K 3.1* 3.3* 3.7  CL 107 105 105  CO2 27 26 22   GLUCOSE 122* 107* 108*  BUN 15 11 20   CREATININE 1.01 0.97 0.95  CALCIUM 8.4* 8.5* 8.9  MG 1.8  --   --   PHOS 3.6 3.3  --    GFR: Estimated Creatinine Clearance: 81.3 mL/min (by C-G formula based on SCr of 0.95 mg/dL). Liver Function Tests: No results for input(s): AST, ALT, ALKPHOS, BILITOT, PROT, ALBUMIN in the last 168 hours. No results for input(s): LIPASE, AMYLASE in the last 168 hours. No results for input(s): AMMONIA in the last 168 hours. Coagulation Profile: No results for input(s): INR, PROTIME in the last 168 hours. Cardiac Enzymes: No results for input(s): CKTOTAL,  CKMB, CKMBINDEX, TROPONINI in the last 168 hours. BNP (last 3 results) No results for input(s): PROBNP in the last 8760 hours. HbA1C: No results for input(s): HGBA1C in the last 72 hours. CBG: Recent Labs  Lab 08/31/19 1634 09/05/19 0750 09/05/19 1146 09/05/19 1555 09/06/19 0724  GLUCAP 105* 110* 98 92 105*   Lipid Profile: No results for input(s): CHOL, HDL, LDLCALC, TRIG, CHOLHDL, LDLDIRECT in the last 72 hours. Thyroid Function Tests: No results for input(s): TSH, T4TOTAL, FREET4, T3FREE, THYROIDAB in the last 72 hours. Anemia Panel: No results for input(s): VITAMINB12, FOLATE, FERRITIN, TIBC, IRON, RETICCTPCT in the last 72 hours. Sepsis Labs: No results for input(s): PROCALCITON, LATICACIDVEN in the last 168 hours.  Recent Results (from the past 240 hour(s))  Culture, respiratory (non-expectorated)     Status: None   Collection Time: 08/28/19  2:37 AM   Specimen: Tracheal Aspirate; Respiratory  Result Value Ref Range Status   Specimen Description TRACHEAL ASPIRATE  Final   Special Requests Normal  Final   Gram Stain   Final    MODERATE WBC PRESENT, PREDOMINANTLY PMN MODERATE GRAM POSITIVE COCCI IN PAIRS IN CHAINS FEW GRAM NEGATIVE RODS FEW GRAM POSITIVE RODS Performed at [facility], [address], [place], [place] [address]    Culture   Final    FEW PROTEUS MIRABILIS MODERATE STAPHYLOCOCCUS AUREUS    Report Status 08/31/2019 FINAL  Final   Organism ID, Bacteria PROTEUS MIRABILIS  Final   Organism ID, Bacteria STAPHYLOCOCCUS AUREUS  Final      Susceptibility   Proteus mirabilis - MIC*    AMPICILLIN <=2 SENSITIVE Sensitive     CEFAZOLIN <=4 SENSITIVE Sensitive     CEFEPIME <=0.12 SENSITIVE Sensitive     CEFTAZIDIME <=1 SENSITIVE Sensitive     CEFTRIAXONE <=0.25 SENSITIVE Sensitive     CIPROFLOXACIN <=0.25 SENSITIVE Sensitive     GENTAMICIN <=1 SENSITIVE Sensitive     IMIPENEM 2 SENSITIVE Sensitive     TRIMETH/SULFA <=20 SENSITIVE Sensitive      AMPICILLIN/SULBACTAM <=2 SENSITIVE Sensitive     PIP/TAZO <=4 SENSITIVE Sensitive     * FEW PROTEUS MIRABILIS   Staphylococcus aureus - MIC*    CIPROFLOXACIN <=0.5 SENSITIVE Sensitive     ERYTHROMYCIN <=0.25 SENSITIVE Sensitive     GENTAMICIN <=0.5 SENSITIVE Sensitive     OXACILLIN 0.5 SENSITIVE Sensitive     TETRACYCLINE <=1 SENSITIVE Sensitive     VANCOMYCIN <=0.5 SENSITIVE Sensitive     TRIMETH/SULFA <=10 SENSITIVE Sensitive     CLINDAMYCIN <=0.25 SENSITIVE Sensitive     RIFAMPIN <=0.5 SENSITIVE Sensitive     Inducible Clindamycin NEGATIVE Sensitive     * MODERATE STAPHYLOCOCCUS AUREUS  Culture, blood (Routine X 2) w Reflex to ID Panel     Status: None   Collection Time: 08/28/19  2:52 AM   Specimen: BLOOD  Result Value Ref Range Status   Specimen Description BLOOD LEFT HAND  Final   Special Requests   Final    BOTTLES DRAWN AEROBIC AND ANAEROBIC Blood Culture results may not be optimal due to an inadequate volume of blood received in culture bottles   Culture   Final    NO GROWTH 5 DAYS Performed at [facility], [address] [place], [place] [address]    Report Status 09/02/2019 FINAL  Final  Culture, blood (Routine X 2) w Reflex to ID Panel     Status: None   Collection Time: 08/28/19  3:02 AM   Specimen: BLOOD  Result Value Ref Range Status   Specimen Description BLOOD LEFT WRIST  Final   Special Requests   Final    BOTTLES DRAWN AEROBIC AND ANAEROBIC Blood Culture adequate volume   Culture   Final    NO GROWTH 5 DAYS Performed at [facility], [address] [place], [place] [address]    Report Status 09/02/2019 FINAL  Final         Radiology Studies: No results found.      Scheduled Meds: . Chlorhexidine Gluconate Cloth  6 each Topical Daily  . feeding supplement (NEPRO CARB STEADY)  237 mL Oral Q24H  . heparin injection (subcutaneous)  5,000 Units Subcutaneous Q8H  . pantoprazole  40 mg Oral QHS  . polyethylene glycol  17 g Oral Daily   . potassium chloride  40 mEq Oral Daily  . senna-docusate  1 tablet Per Tube Daily   Continuous Infusions: . sodium chloride Stopped (08/30/19 1659)  . sodium chloride    . sodium chloride       LOS: 14 days   Time spent= 20 mins    Maile Linford Joline Maxcy, MD [facility]  If 7PM-7AM, please contact  night-coverage  09/06/2019, 9:49 AM

## 2019-09-06 NOTE — Consult Note (Signed)
Telepsych Consultation   Reason for Consult:  Substance use Referring Physician:  [facility] Location of Patient: [facility] 2W04 Location of Provider: Behavioral Health TTS Department  Patient Identification: Gary Davis MRN:  [id number] Principal Diagnosis: <principal problem not specified> Diagnosis:  Active Problems:   Tobacco use disorder   Arthritis   Osteoarthritis of right knee   Acute respiratory failure (HCC)   Cardiac arrest (HCC)   Acute systolic CHF (congestive heart failure) (HCC)   Total Time spent with patient: 45 minutes  Subjective:   Gary Davis is a 64 y.o. male patient admitted after being found unconscious by his roommate. He reports a history of intermittent use of cocaine and alcohol, although his presentation is consistent with an overdose he overtly denies such. He states " I do a little of this and a little of that. I just did to much this time. I dib and dab and over did it. Im not trying to be rude but I would like to get past this so that I can move on with my life. Francesca Oman keep bringing it up and talking to me about it. Im trying to forget all this dumb shit I done did. I feel better and Im not going to the nursing. If yall send me there it is only going to piss me off more. My goal is to go home. " He denies si/hi/avh.   HPI:  Gary Davis is a 64 year old male with past medical history of cocaine and alcohol abuse, depression and orthopedic problems who was found unresponsive by his roommate/fiance around 3 PM 08/23/2019.  Per reports, he was last seen well around 10 or 11 AM and was drinking alcohol and smoking cocaine.  He stopped breathing after fiance found him and she called EMS.  Narcan was administered without improvement, pulses were then lost and CPR was performed and he responded to 1 round without epi.  He was intubated and transported to the emergency department.  In the ED, he has not had any significant neurologic activity hours after after  RSI medications.  Did not show signs of STEMI or significant ischemia,  labs were significant for lactic acid >11, respiratory acidosis, renal insufficiency, mild troponin anemia and negative flu and COVID-19.  He has required Levophed for hypotension and Bair hugger for hypothermia.  CT head, chest, abdomen pelvis pending and PCCM consulted for admission.  Upon evaluation patient is alert and oriented, grudgingly cooperative and irritated. He is open about his substance use but denies this as an intentional overdose. He states he uses crack every few months. He is not forthcoming about his mental health history and does not wish to talk about events leading up this admission. He continues to be very labile. When assessing his safety and mentation upon his discharge he identifies his brother and his wife who have offered him support. "My brother can help with the groceries, and all I need is a walker at home I will be able to move aorund the house. He denies si/hi/avh at this time.   Past Psychiatric History: Polysubstance abuse, Depression  Risk to Self:   Risk to Others:   Prior Inpatient Therapy:   Prior Outpatient Therapy:    Past Medical History:  Past Medical History:  Diagnosis Date  . Acute respiratory failure (HCC) 08/2019  . Alcohol abuse   . Arthritis    "both knees; joints in hands; back of my neck; lower back" (11/04/2015)  . Cataract   . Chronic  back pain    "upper and lower" (11/04/2015)  . Depression   . Fractures 04/2015   neck fracture  . GERD (gastroesophageal reflux disease)     Past Surgical History:  Procedure Laterality Date  . ARTHROSCOPIC REPAIR ACL Right   . BACK SURGERY    . FINGER FRACTURE SURGERY Right    4th Finger  . FRACTURE SURGERY    . JOINT REPLACEMENT    . MANDIBLE FRACTURE SURGERY  1990s  . ORBITAL FRACTURE SURGERY Right ~ 1970  . ORIF ANKLE FRACTURE Right   . POSTERIOR CERVICAL FUSION/FORAMINOTOMY N/A 02/09/2014   Procedure: POSTERIOR CERVICAL  FUSION/FORAMINOTOMY LEVEL 4  Cervical three to seven decompression and fusion with lateral mass screws;  Surgeon: Mariam Dollar, MD;  Location: [facility] NEURO ORS;  Service: Neurosurgery;  Laterality: N/A;  . POSTERIOR CERVICAL FUSION/FORAMINOTOMY N/A 04/15/2015   Procedure: Posterior cervical fusion C6-T1 with instrumentation and arthrodesis, decompressive laminectomy C7-T1, Foraminotomy C8-T1;  Surgeon: Donalee Citrin, MD;  Location: [facility] NEURO ORS;  Service: Neurosurgery;  Laterality: N/A;  . TONSILLECTOMY    . TOTAL KNEE ARTHROPLASTY Right 11/04/2015  . TOTAL KNEE ARTHROPLASTY Right 11/04/2015   Procedure: RIGHT TOTAL KNEE ARTHROPLASTY;  Surgeon: Tarry Kos, MD;  Location: [facility] OR;  Service: Orthopedics;  Laterality: Right;   Family History:  Family History  Problem Relation Age of Onset  . Cancer Mother        breast cancer   Family Psychiatric  History: Denies Social History:  Social History   Substance and Sexual Activity  Alcohol Use Yes  . Alcohol/week: 6.0 standard drinks  . Types: 6 Cans of beer per week     Social History   Substance and Sexual Activity  Drug Use No    Social History   Socioeconomic History  . Marital status: Divorced    Spouse name: Not on file  . Number of children: Not on file  . Years of education: Not on file  . Highest education level: Not on file  Occupational History  . Not on file  Tobacco Use  . Smoking status: Current Some Day Smoker    Packs/day: 0.12    Years: 30.00    Pack years: 3.60    Types: Cigarettes  . Smokeless tobacco: Never Used  Substance and Sexual Activity  . Alcohol use: Yes    Alcohol/week: 6.0 standard drinks    Types: 6 Cans of beer per week  . Drug use: No  . Sexual activity: Not Currently  Other Topics Concern  . Not on file  Social History Narrative  . Not on file   Social Determinants of Health   Financial Resource Strain:   . Difficulty of Paying Living Expenses: Not on file  Food Insecurity:   . Worried About  Programme researcher, broadcasting/film/video in the Last Year: Not on file  . Ran Out of Food in the Last Year: Not on file  Transportation Needs:   . Lack of Transportation (Medical): Not on file  . Lack of Transportation (Non-Medical): Not on file  Physical Activity:   . Days of Exercise per Week: Not on file  . Minutes of Exercise per Session: Not on file  Stress:   . Feeling of Stress : Not on file  Social Connections:   . Frequency of Communication with Friends and Family: Not on file  . Frequency of Social Gatherings with Friends and Family: Not on file  . Attends Religious Services: Not on file  .  Active Member of Clubs or Organizations: Not on file  . Attends Banker Meetings: Not on file  . Marital Status: Not on file   Additional Social History:    Allergies:   Allergies  Allergen Reactions  . Hydrocodone Itching and Nausea And Vomiting  . Percocet [Oxycodone-Acetaminophen] Nausea And Vomiting    Can take oxycodone without tylenol additive  . Tramadol Nausea Only    Labs:  Results for orders placed or performed during the hospital encounter of 08/23/19 (from the past 48 hour(s))  Glucose, capillary     Status: Abnormal   Collection Time: 09/05/19  7:50 AM  Result Value Ref Range   Glucose-Capillary 110 (H) 70 - 99 mg/dL    Comment: Glucose reference range applies only to samples taken after fasting for at least 8 hours.  Glucose, capillary     Status: None   Collection Time: 09/05/19 11:46 AM  Result Value Ref Range   Glucose-Capillary 98 70 - 99 mg/dL    Comment: Glucose reference range applies only to samples taken after fasting for at least 8 hours.  Glucose, capillary     Status: None   Collection Time: 09/05/19  3:55 PM  Result Value Ref Range   Glucose-Capillary 92 70 - 99 mg/dL    Comment: Glucose reference range applies only to samples taken after fasting for at least 8 hours.  Glucose, capillary     Status: Abnormal   Collection Time: 09/06/19  7:24 AM  Result  Value Ref Range   Glucose-Capillary 105 (H) 70 - 99 mg/dL    Comment: Glucose reference range applies only to samples taken after fasting for at least 8 hours.    Medications:  Current Facility-Administered Medications  Medication Dose Route Frequency Provider Last Rate Last Admin  . 0.9 %  sodium chloride infusion   Intra-arterial PRN Coralyn Helling, MD   Stopped at 08/30/19 1659  . 0.9 %  sodium chloride infusion  250 mL Intravenous Continuous Coralyn Helling, MD      . acetaminophen (TYLENOL) tablet 650 mg  650 mg Oral Q6H PRN Burnadette Pop, MD   650 mg at 09/03/19 0550  . bisacodyl (DULCOLAX) suppository 10 mg  10 mg Rectal Daily PRN Coralyn Helling, MD      . Chlorhexidine Gluconate Cloth 2 % PADS 6 each  6 each Topical Daily Burnadette Pop, MD   6 each at 09/02/19 1422  . docusate (COLACE) 50 MG/5ML liquid 100 mg  100 mg Per Tube BID PRN Coralyn Helling, MD   100 mg at 08/28/19 2145  . feeding supplement (NEPRO CARB STEADY) liquid 237 mL  237 mL Oral Q24H Burnadette Pop, MD   237 mL at 09/01/19 1312  . heparin injection 5,000 Units  5,000 Units Subcutaneous Q8H Burnadette Pop, MD   5,000 Units at 09/05/19 2108  . lip balm (CARMEX) ointment   Topical PRN Amin, Ankit Chirag, MD      . pantoprazole (PROTONIX) EC tablet 40 mg  40 mg Oral QHS Briant Sites, DO   40 mg at 09/05/19 2108  . polyethylene glycol (MIRALAX / GLYCOLAX) packet 17 g  17 g Oral Daily Briant Sites, DO      . potassium chloride SA (KLOR-CON) CR tablet 40 mEq  40 mEq Oral Daily Burnadette Pop, MD   40 mEq at 09/05/19 0753  . Resource ThickenUp Clear   Oral PRN Oretha Milch, MD      . senna-docusate (Senokot-S) tablet 1  tablet  1 tablet Per Tube Daily Julian Hy, DO   1 tablet at 09/03/19 0934  . sodium chloride 0.9 % bolus 1,000 mL  1,000 mL Intravenous Once Richarda Osmond, DO        Musculoskeletal: Strength & Muscle Tone: within normal limits Gait & Station: normal Patient leans: N/A  Psychiatric  Specialty Exam: Physical Exam  Nursing note and vitals reviewed. Constitutional: He appears well-developed and well-nourished.  Psychiatric: Judgment and thought content normal.  Irritable     Review of Systems  Psychiatric/Behavioral: Positive for agitation. Negative for behavioral problems, confusion, decreased concentration, dysphoric mood, hallucinations, self-injury, sleep disturbance and suicidal ideas. The patient is nervous/anxious. The patient is not hyperactive.   All other systems reviewed and are negative.   Blood pressure 93/66, pulse 67, temperature (!) 97.5 F (36.4 C), temperature source Oral, resp. rate 20, height 6' (1.829 m), weight 74.1 kg, SpO2 98 %.Body mass index is 22.16 kg/m.  General Appearance: Fairly Groomed  Eye Contact:  Fair  Speech:  Clear and Coherent and Normal Rate  Volume:  Normal  Mood:  Irritable  Affect:  Blunt, Congruent and Labile  Thought Process:  Linear and Descriptions of Associations: Intact  Orientation:  Full (Time, Place, and Person)  Thought Content:  Logical and Rumination  Suicidal Thoughts:  No  Homicidal Thoughts:  No  Memory:  Immediate;   Fair Recent;   Fair  Judgement:  Poor  Insight:  Shallow  Psychomotor Activity:  Normal  Concentration:  Concentration: Fair and Attention Span: Fair  Recall:  [employer] of Knowledge:  Fair  Language:  Fair  Akathisia:  No  Handed:  Right  AIMS (if indicated):     Assets:  Communication Skills Desire for Improvement Financial Resources/Insurance Leisure Time Physical Health Social Support  ADL's:  Intact  Cognition:  WNL  Sleep:        Treatment Plan Summary: Plan Will psych clear at this time. Patient does have the capacity to make medical decisions.   Disposition: No evidence of imminent risk to self or others at present.   Patient does not meet criteria for psychiatric inpatient admission. Supportive therapy provided about ongoing stressors.  This service was provided  via telemedicine using a 2-way, interactive audio and video technology.  Names of all persons participating in this telemedicine service and their role in this encounter. Name: Sheran Fava Role: FNP-BC, PMHNP-BC, DNP  Name: Gary Davis Role: Patient     Suella Broad, FNP 09/06/2019 10:30 AM

## 2019-09-07 LAB — GLUCOSE, CAPILLARY
Glucose-Capillary: 137 mg/dL — ABNORMAL HIGH (ref 70–99)
Glucose-Capillary: 147 mg/dL — ABNORMAL HIGH (ref 70–99)
Glucose-Capillary: 90 mg/dL (ref 70–99)

## 2019-09-07 NOTE — Progress Notes (Signed)
PROGRESS NOTE    Gary Davis  BJS:[id number] DOB: May 28, 1956 DOA: 08/23/2019 PCP: Medicine, [facility]   Brief Narrative:  64 year old with history of cocaine/alcohol use, depression initially found unresponsive by his roommate brought to the hospital requiring 1 round of CPR.  Had ROSC but subsequently intubated.  Most of the work-up was negative but his echocardiogram showed EF of 30% with moderate to severe hypokinesia seen by cardiology team.  Nuclear scan showed normal perfusion with low risk study per cardiology.  Cleared by cardiology team for discharge.  PT recommended SNF, currently [facility] team working on this.   Assessment & Plan:   Active Problems:   Tobacco use disorder   Arthritis   Osteoarthritis of right knee   Acute respiratory failure (HCC)   Cardiac arrest (HCC)   Acute systolic CHF (congestive heart failure) (HCC)  Cardiac arrest likely secondary to drug use Congestive heart failure with reduced ejection fraction, 30% with global hypokinesia -Nuclear stress test performed by cardiology on 2/20 showed low risk study.  Cleared for discharge at this time.  Consider repeating echocardiogram in 6-8 weeks  Acute hypoxic/hypercapnic respiratory failure with aspiration pneumonitis, stable 1.4 cm pulmonary nodule -MSSA in the sputum.  Has left 10th rib fracture. -Completed Keflex 3/3 -Would benefit from outpatient pulmonary evaluation and routine follow-up as appropriate.  Repeat outpatient CT in 3 months  Acute metabolic encephalopathy, greatly improved -Suspect from anoxic brain injury during his cardiac arrest. -Patient has capacity to make his decisions in my evaluation.  Appreciate psychiatry input.  Left perinephric hematoma -Appears to be stable per renal ultrasound on 2/25  History of arthritis/cervical fusion -Pain management  Right wrist pain/deformity -Likely from bone bruising.  X-rays are negative.  Working with speech and swallow  team-recommending regular diet with thin liquids  PT/OT evaluation performed.  SNF recommended.  SNF appropriate as the patient has received 3 days of hospital care (or the 3 days stay Has been made by the patient's insurance company) and is felt to need rehab services to restore this patient to their prior level of function to achieve safe transition back to home care.  This patient needs rehab services for at least 5 days/week and skilled nursing services daily to facilitate this transition.  Rehab is been requested as the most appropriate discharge option for this patient and is not felt to be custodial care as evidenced by his previous independence as he lives by himself at his house.  Currently with his improvement in his mentation, he would be a great rehabilitation candidate.   DVT prophylaxis: Subcutaneous heparin Code Status: Full code Family Communication: Spoke with his brother.  Disposition Plan:   Patient From= home  Patient Anticipated D/C place= SNF  Barriers= pending placement otherwise medically stable.  If he can continue to advance with physical therapy, his disposition may change to home health.   Subjective: Continues to state he wants to go home but he understands that he still requires significant assistance and daily living at this time.  I explained to him that we are hopeful regarding his recovery with therapy I advised him to discuss his social situation further with his brother  Review of Systems Otherwise negative except as per HPI, including: General = no fevers, chills, dizziness,  fatigue HEENT/EYES = negative for loss of vision, double vision, blurred vision,  sore throa Cardiovascular= negative for chest pain, palpitation Respiratory/lungs= negative for shortness of breath, cough, wheezing; hemoptysis,  Gastrointestinal= negative for nausea, vomiting,  abdominal pain Genitourinary= negative for Dysuria MSK = Negative for arthralgia, myalgias Neurology=  Negative for headache, numbness, tingling  Psychiatry= Negative for suicidal and homocidal ideation Skin= Negative for Rash    Examination: Constitutional: Not in acute distress Respiratory: Clear to auscultation bilaterally Cardiovascular: Normal sinus rhythm, no rubs Abdomen: Nontender nondistended good bowel sounds Musculoskeletal: No edema noted Skin: No rashes seen Neurologic: CN 2-12 grossly intact.  And nonfocal Psychiatric: Normal judgment and insight. Alert and oriented x 3. Normal mood.    Objective: Vitals:   09/06/19 0836 09/06/19 1827 09/06/19 2333 09/07/19 0859  BP: 93/66 104/63 115/81 95/71  Pulse:  65 65 67  Resp: 20 20  19   Temp: (!) 97.5 F (36.4 C) 98.7 F (37.1 C) 98.8 F (37.1 C) 98 F (36.7 C)  TempSrc: Oral     SpO2: 98% 97% 100% 99%  Weight:      Height:        Intake/Output Summary (Last 24 hours) at 09/07/2019 0947 Last data filed at 09/06/2019 2335 Gross per 24 hour  Intake 480 ml  Output 625 ml  Net -145 ml   Filed Weights   09/01/19 0302 09/02/19 0500 09/03/19 0500  Weight: 75.5 kg 75.7 kg 74.1 kg     Data Reviewed:   CBC: No results for input(s): WBC, NEUTROABS, HGB, HCT, MCV, PLT in the last 168 hours. Basic Metabolic Panel: Recent Labs  Lab 09/01/19 0239 09/03/19 0540  NA 143 140  K 3.3* 3.7  CL 105 105  CO2 26 22  GLUCOSE 107* 108*  BUN 11 20  CREATININE 0.97 0.95  CALCIUM 8.5* 8.9  PHOS 3.3  --    GFR: Estimated Creatinine Clearance: 81.3 mL/min (by C-G formula based on SCr of 0.95 mg/dL). Liver Function Tests: No results for input(s): AST, ALT, ALKPHOS, BILITOT, PROT, ALBUMIN in the last 168 hours. No results for input(s): LIPASE, AMYLASE in the last 168 hours. No results for input(s): AMMONIA in the last 168 hours. Coagulation Profile: No results for input(s): INR, PROTIME in the last 168 hours. Cardiac Enzymes: No results for input(s): CKTOTAL, CKMB, CKMBINDEX, TROPONINI in the last 168 hours. BNP (last 3  results) No results for input(s): PROBNP in the last 8760 hours. HbA1C: No results for input(s): HGBA1C in the last 72 hours. CBG: Recent Labs  Lab 09/05/19 1555 09/06/19 0724 09/06/19 1224 09/06/19 1656 09/07/19 0714  GLUCAP 92 105* 129* 110* 137*   Lipid Profile: No results for input(s): CHOL, HDL, LDLCALC, TRIG, CHOLHDL, LDLDIRECT in the last 72 hours. Thyroid Function Tests: No results for input(s): TSH, T4TOTAL, FREET4, T3FREE, THYROIDAB in the last 72 hours. Anemia Panel: No results for input(s): VITAMINB12, FOLATE, FERRITIN, TIBC, IRON, RETICCTPCT in the last 72 hours. Sepsis Labs: No results for input(s): PROCALCITON, LATICACIDVEN in the last 168 hours.  No results found for this or any previous visit (from the past 240 hour(s)).       Radiology Studies: No results found.      Scheduled Meds: . Chlorhexidine Gluconate Cloth  6 each Topical Daily  . feeding supplement (NEPRO CARB STEADY)  237 mL Oral Q24H  . heparin injection (subcutaneous)  5,000 Units Subcutaneous Q8H  . pantoprazole  40 mg Oral QHS  . polyethylene glycol  17 g Oral Daily  . potassium chloride  40 mEq Oral Daily  . senna-docusate  1 tablet Per Tube Daily   Continuous Infusions: . sodium chloride Stopped (08/30/19 1659)  . sodium chloride    .  sodium chloride       LOS: 15 days   Time spent= 20 mins    Atziri Zubiate Joline Maxcy, MD [facility]  If 7PM-7AM, please contact night-coverage  09/07/2019, 9:47 AM

## 2019-09-07 NOTE — Progress Notes (Signed)
This patient increasingly complained about being in the hospital throughout the day.  The patient stated early this morning that the doctor told him to call his "brother for a ride and go home."  This writer attempted to explain to the patient numerous times that there was not a discharge order.  Also, the notes did not match what the patient was saying.  The patient escalated with each conversation.  The patient stated, "I'm getting the f**k out of here today no matter what!"  "You people can't keep me here."  I better not see you alone in the dark!"  This writer tried to discuss in-patient rehab vs. Home several times, but the patient just rolled away and refused to listen.

## 2019-09-07 NOTE — Progress Notes (Signed)
Pt refused 2200 heparin injection and protonix. Education provided. No needs.

## 2019-09-08 LAB — GLUCOSE, CAPILLARY
Glucose-Capillary: 101 mg/dL — ABNORMAL HIGH (ref 70–99)
Glucose-Capillary: 98 mg/dL (ref 70–99)

## 2019-09-08 NOTE — Progress Notes (Signed)
St. Cath done with only 50 ml dark yellow urine returned.  This Clinical research associate encouraged patient to drink more water.  Staff checked the commode where about 600 ml of dark yellow urine was noted.  Patient made aware of the amount of urine.  No further complaints made by patient.

## 2019-09-08 NOTE — Progress Notes (Signed)
  Speech Language Pathology Treatment: Dysphagia  Patient Details Name: Gary Davis MRN: [id number] DOB: April 10, 1956 Today's Date: 09/08/2019 Time: 0240-9735 SLP Time Calculation (min) (ACUTE ONLY): 10 min  Assessment / Plan / Recommendation Clinical Impression  Pt was seen for skilled ST targeting dysphagia, education, and diet tolerance.  He was encountered awake in bed and he was agreeable to ST tx session.  Cups with straws were observed in the pt's room despite recommendations for no straw use.  Upon SLP arrival, pt verbalized safe swallow strategies; however, he stated that he was "eating and drinking just fine" and that he was not going to use the Provale cup because "not enough water comes out at one time".  Pt was re-educated regarding the Provale cup and how it limits the bolus size to 5cc and the rationale behind the recommendation for him to use the Provale cup.  He verbalized understanding but stated that he most likely would not use it.  Pt was observed with trials of water via cup sip and straw sip in addition to trials of regular solids.  Pt was observed to be impulsive during all PO intake and he exhibited prolonged mastication of regular solids with an immediate cough noted following 1/4 trials.  Verbal cues were not helpful in limiting the pt's bolus size or rate of intake.  No overt s/sx of aspiration were observed with thin liquid trials.  Pt was re-educated regarding safe swallow strategies and he verbalized understanding.  Per pt preference, recommend continuation of regular solids and thin liquids (preferably cup sip only).  Due to pt's unwillingness to follow recommendations across multiple tx sessions, his current clinical presentation, and RN report that he is tolerating his current diet well, SLP will s/o at this time.  All education has been completed with pt and RN. Please re-consult if additional needs arise.     HPI HPI: 64 yo male admitted 08/23/19 after being found  unresponsive with unknown down time. PMH: cocaine, Alcohol abuse, Arthritis, Cataract, Chronic back pain, Depression, Fractures (04/2015), and GERD. Intubated 2/20-26/21. CXR = No acute cardiopulmonary disease.      SLP Plan  Discharge SLP treatment due to (comment)(Pt not following recommendations )       Recommendations  Diet recommendations: Regular;Thin liquid Liquids provided via: Cup Medication Administration: Whole meds with liquid Supervision: Patient able to self feed;Staff to assist with self feeding;Full supervision/cueing for compensatory strategies Compensations: Minimize environmental distractions;Slow rate;Small sips/bites;Lingual sweep for clearance of pocketing Postural Changes and/or Swallow Maneuvers: Seated upright 90 degrees                Oral Care Recommendations: Oral care BID Follow up Recommendations: None SLP Visit Diagnosis: Dysphagia, unspecified (R13.10) Plan: Discharge SLP treatment due to (comment)(Pt not following recommendations )                     Villa Herb., M.S., CCC-SLP Acute Rehabilitation Services Office: [phone]  Shanon Rosser [facility] 09/08/2019, 3:52 PM

## 2019-09-08 NOTE — Progress Notes (Signed)
Patient refused Senna and Miralax.  Patient moved bowels this am.

## 2019-09-08 NOTE — Progress Notes (Signed)
Nutrition Follow-up  DOCUMENTATION CODES:   Not applicable  INTERVENTION:   Continue Magic cup TID with meals, each supplement provides 290 kcal and 9 grams of protein   Continue Nepro Shake po once daily, each supplement provides 425 kcal and 19 grams protein  NUTRITION DIAGNOSIS:   Inadequate oral intake related to acute illness, inability to eat as evidenced by NPO status.  Resolved   GOAL:   Patient will meet greater than or equal to 90% of their needs  Met  MONITOR:   Vent status, TF tolerance, Weight trends, Labs  REASON FOR ASSESSMENT:   Consult, Ventilator Enteral/tube feeding initiation and management  ASSESSMENT:   64 yo male admitted after being found unresponsive and admitted post cardiac arrest with subsequent respiratory failure and cardiogenic shock, nonoliguric AKI. PMH includes cocaine and EtOH abuse, depression  Patient is awaiting SNF placement. He has been eating very well. Lunch tray at bedside, patient ate 100%. He states he ate all of breakfast today too. He has been receiving Nepro shake supplements, but could not open it earlier today so he did not drink it. Encouraged patient to ask nursing staff for assistance with opening Nepro shakes.    Labs and medications reviewed.   Diet Order:   Diet Order            Diet regular Room service appropriate? Yes; Fluid consistency: Thin  Diet effective now              EDUCATION NEEDS:   Not appropriate for education at this time  Skin:  Skin Assessment: Reviewed RN Assessment  Last BM:  3/8  Height:   Ht Readings from Last 1 Encounters:  08/23/19 6' (1.829 m)    Weight:   Wt Readings from Last 1 Encounters:  09/03/19 74.1 kg    BMI:  Body mass index is 22.16 kg/m.  Estimated Nutritional Needs:   Kcal:  1900-2100  Protein:  95-105  Fluid:  >/= 1.9 L/day    Molli Barrows, RD, LDN, CNSC Please refer to Amion for contact information.

## 2019-09-08 NOTE — Progress Notes (Signed)
PROGRESS NOTE    Gary Davis  IRW:[id number] DOB: Aug 01, 1955 DOA: 08/23/2019 PCP: Medicine, [facility]   Brief Narrative:  64 year old with history of cocaine/alcohol use, depression initially found unresponsive by his roommate brought to the hospital requiring 1 round of CPR.  Had ROSC but subsequently intubated.  Most of the work-up was negative but his echocardiogram showed EF of 30% with moderate to severe hypokinesia seen by cardiology team.  Nuclear scan showed normal perfusion with low risk study per cardiology.  Cleared by cardiology team for discharge.  PT recommended SNF, currently [facility] team working on this.   Assessment & Plan:   Active Problems:   Tobacco use disorder   Arthritis   Osteoarthritis of right knee   Acute respiratory failure (HCC)   Cardiac arrest (HCC)   Acute systolic CHF (congestive heart failure) (HCC)  Cardiac arrest likely secondary to drug use Congestive heart failure with reduced ejection fraction, 30% with global hypokinesia -Nuclear stress test performed by cardiology on 2/20 showed low risk study.  Cleared for discharge at this time.  Repeating echocardiogram in 6-8 weeks  Acute hypoxic/hypercapnic respiratory failure with aspiration pneumonitis, stable 1.4 cm pulmonary nodule -MSSA in the sputum.  Has left 10th rib fracture. -Completed Keflex 3/3 -Would benefit from outpatient pulmonary evaluation and routine follow-up as appropriate.  Repeat outpatient CT in 3 months  Acute metabolic encephalopathy, greatly improved -Suspect from anoxic brain injury during his cardiac arrest. -Patient has capacity to make his decisions in my evaluation.  Appreciate psychiatry input.  Left perinephric hematoma -Appears to be stable per renal ultrasound on 2/25  History of arthritis/cervical fusion -Pain management  Right wrist pain/deformity -Likely from bone bruising.  X-rays are negative.  Working with speech and swallow  team-recommending regular diet with thin liquids  PT/OT evaluation performed.  SNF recommended.  SNF appropriate as the patient has received 3 days of hospital care (or the 3 days stay Has been made by the patient's insurance company) and is felt to need rehab services to restore this patient to their prior level of function to achieve safe transition back to home care.  This patient needs rehab services for at least 5 days/week and skilled nursing services daily to facilitate this transition.  Rehab is been requested as the most appropriate discharge option for this patient and is not felt to be custodial care as evidenced by his previous independence as he lives by himself at his house.  Currently with his improvement in his mentation, he would be a great rehabilitation candidate.   DVT prophylaxis: Subcutaneous heparin Code Status: Full code Family Communication: Spoke with his brother on the phone while I was in the room . Disposition Plan:   Patient From= home  Patient Anticipated D/C place= SNF  Barriers= medically stable, pending placement.  Will see if he continues to advance with physical therapy in the hospital.  I have requested him to see the patient again  Subjective: Wants to go home again.  I spoke with the brother over the phone while I was in the room with the patient on the speaker.  Brother tells me is unable to care for the patient at home but the patient thinks he will be able to take care of himself.  At this point we have agreed that we will continue PT/OT evaluation and progression in the hospital we await for SNF placement.  If he progresses well enough then maybe we can consider discharging him home with  home health. Patient is not happy about this but this is the only option he really has.  He also tells me he is going to sign out AMA but I explained evidence physically not able to get himself from the hospital to home.  Review of Systems Otherwise negative except as per  HPI, including: General = no fevers, chills, dizziness,  fatigue HEENT/EYES = negative for loss of vision, double vision, blurred vision,  sore throa Cardiovascular= negative for chest pain, palpitation Respiratory/lungs= negative for shortness of breath, cough, wheezing; hemoptysis,  Gastrointestinal= negative for nausea, vomiting, abdominal pain Genitourinary= negative for Dysuria MSK = Negative for arthralgia, myalgias Neurology= Negative for headache, numbness, tingling  Psychiatry= Negative for suicidal and homocidal ideation Skin= Negative for Rash   Examination: Constitutional: Not in acute distress Respiratory: Clear to auscultation bilaterally Cardiovascular: Normal sinus rhythm, no rubs Abdomen: Nontender nondistended good bowel sounds Musculoskeletal: No edema noted Skin: No rashes seen Neurologic: CN 2-12 grossly intact.  And nonfocal Psychiatric: Normal judgment and insight. Alert and oriented x 3. Normal mood.     Objective: Vitals:   09/07/19 1708 09/07/19 2310 09/07/19 2310 09/08/19 0750  BP: 119/75  100/64 114/74  Pulse: 67  64 61  Resp: 19   16  Temp: 98.7 F (37.1 C)  99.1 F (37.3 C) 98.2 F (36.8 C)  TempSrc:      SpO2: 100% 100%  100%  Weight:      Height:        Intake/Output Summary (Last 24 hours) at 09/08/2019 1119 Last data filed at 09/08/2019 0224 Gross per 24 hour  Intake 200 ml  Output 400 ml  Net -200 ml   Filed Weights   09/01/19 0302 09/02/19 0500 09/03/19 0500  Weight: 75.5 kg 75.7 kg 74.1 kg     Data Reviewed:   CBC: No results for input(s): WBC, NEUTROABS, HGB, HCT, MCV, PLT in the last 168 hours. Basic Metabolic Panel: Recent Labs  Lab 09/03/19 0540  NA 140  K 3.7  CL 105  CO2 22  GLUCOSE 108*  BUN 20  CREATININE 0.95  CALCIUM 8.9   GFR: Estimated Creatinine Clearance: 81.3 mL/min (by C-G formula based on SCr of 0.95 mg/dL). Liver Function Tests: No results for input(s): AST, ALT, ALKPHOS, BILITOT, PROT,  ALBUMIN in the last 168 hours. No results for input(s): LIPASE, AMYLASE in the last 168 hours. No results for input(s): AMMONIA in the last 168 hours. Coagulation Profile: No results for input(s): INR, PROTIME in the last 168 hours. Cardiac Enzymes: No results for input(s): CKTOTAL, CKMB, CKMBINDEX, TROPONINI in the last 168 hours. BNP (last 3 results) No results for input(s): PROBNP in the last 8760 hours. HbA1C: No results for input(s): HGBA1C in the last 72 hours. CBG: Recent Labs  Lab 09/06/19 1656 09/07/19 0714 09/07/19 1203 09/07/19 1650 09/08/19 0751  GLUCAP 110* 137* 147* 90 101*   Lipid Profile: No results for input(s): CHOL, HDL, LDLCALC, TRIG, CHOLHDL, LDLDIRECT in the last 72 hours. Thyroid Function Tests: No results for input(s): TSH, T4TOTAL, FREET4, T3FREE, THYROIDAB in the last 72 hours. Anemia Panel: No results for input(s): VITAMINB12, FOLATE, FERRITIN, TIBC, IRON, RETICCTPCT in the last 72 hours. Sepsis Labs: No results for input(s): PROCALCITON, LATICACIDVEN in the last 168 hours.  No results found for this or any previous visit (from the past 240 hour(s)).       Radiology Studies: No results found.      Scheduled Meds: . Chlorhexidine Gluconate Cloth  6 each Topical Daily  . feeding supplement (NEPRO CARB STEADY)  237 mL Oral Q24H  . heparin injection (subcutaneous)  5,000 Units Subcutaneous Q8H  . pantoprazole  40 mg Oral QHS  . polyethylene glycol  17 g Oral Daily  . potassium chloride  40 mEq Oral Daily  . senna-docusate  1 tablet Per Tube Daily   Continuous Infusions: . sodium chloride Stopped (08/30/19 1659)  . sodium chloride    . sodium chloride       LOS: 16 days   Time spent= 20 mins    Wardell Pokorski Joline Maxcy, MD [facility]  If 7PM-7AM, please contact night-coverage  09/08/2019, 11:19 AM

## 2019-09-08 NOTE — Progress Notes (Signed)
The patient c/o difficulty urinating.  Patient stated, "I've been up and down all day and can't go."  Bladder scanner only revealed 114 ml urine in the bladder.  Will notify MD and await instruction.

## 2019-09-09 LAB — GLUCOSE, CAPILLARY
Glucose-Capillary: 104 mg/dL — ABNORMAL HIGH (ref 70–99)
Glucose-Capillary: 107 mg/dL — ABNORMAL HIGH (ref 70–99)

## 2019-09-09 MED ORDER — ENOXAPARIN SODIUM 40 MG/0.4ML ~~LOC~~ SOLN
40.0000 mg | SUBCUTANEOUS | Status: DC
  Administered 2019-09-10 – 2019-09-15 (×5): 40 mg via SUBCUTANEOUS
  Filled 2019-09-09 (×4): qty 0.4

## 2019-09-09 MED ORDER — SODIUM CHLORIDE 0.9 % IV BOLUS: 500.0000 mL | Freq: Once | INTRAVENOUS | Status: DC

## 2019-09-09 NOTE — Progress Notes (Signed)
PROGRESS NOTE    Gary Davis  HUT:[id number] DOB: May 06, 1956 DOA: 08/23/2019 PCP: Medicine, [facility]   Brief Narrative:  64 year old with history of cocaine/alcohol use, depression initially found unresponsive by his roommate brought to the hospital requiring 1 round of CPR.  Had ROSC but subsequently intubated.  Most of the work-up was negative but his echocardiogram showed EF of 30% with moderate to severe hypokinesia seen by cardiology team.  Nuclear scan showed normal perfusion with low risk study per cardiology.  Cleared by cardiology team for discharge.  PT recommended SNF, currently [facility] team working on this.   Assessment & Plan:   Active Problems:   Tobacco use disorder   Arthritis   Osteoarthritis of right knee   Acute respiratory failure (HCC)   Cardiac arrest (HCC)   Acute systolic CHF (congestive heart failure) (HCC)  Cardiac arrest likely secondary to drug use Congestive heart failure with reduced ejection fraction, 30% with global hypokinesia -Nuclear stress test performed by cardiology on 2/20 showed low risk study.  Cleared for discharge at this time.  Repeating echocardiogram in 6-8 weeks  Acute hypoxic/hypercapnic respiratory failure with aspiration pneumonitis, stable 1.4 cm pulmonary nodule -MSSA in the sputum.  Has left 10th rib fracture. -Completed Keflex 3/3 -Would benefit from outpatient pulmonary evaluation and routine follow-up as appropriate.  Repeat outpatient CT in 3 months  Acute metabolic encephalopathy, greatly improved -Suspect from anoxic brain injury during his cardiac arrest. -Patient has capacity to make his decisions in my evaluation.  Appreciate psychiatry input.  Left perinephric hematoma -Appears to be stable per renal ultrasound on 2/25  History of arthritis/cervical fusion -Pain management  Right wrist pain/deformity -Likely from bone bruising.  X-rays are negative.  Mild dehydration -Lack of IV access,  patient refusing IV at this time.  Encourage oral hydration.  Working with speech and swallow team-recommending regular diet with thin liquids  PT/OT evaluation performed.  SNF recommended.  SNF appropriate as the patient has received 3 days of hospital care (or the 3 days stay Has been made by the patient's insurance company) and is felt to need rehab services to restore this patient to their prior level of function to achieve safe transition back to home care.  This patient needs rehab services for at least 5 days/week and skilled nursing services daily to facilitate this transition.  Rehab is been requested as the most appropriate discharge option for this patient and is not felt to be custodial care as evidenced by his previous independence as he lives by himself at his house.  Currently with his improvement in his mentation, he would be a great rehabilitation candidate.  Working with physical therapy-continues to require a lot of assistance and still needs skilled nursing facility.   DVT prophylaxis: Subcutaneous heparin Code Status: Full code Family Communication: Called brother, no answer Disposition Plan:   Patient From= home  Patient Anticipated D/C place= SNF  Barriers= medically stable, pending placement.  Will see if he continues to advance with physical therapy in the hospital.  I have requested him to see the patient again  Subjective: Worked with PT this morning and was still quite unsteady by himself.  Some visual difficulty for which he wears glasses, requested his brother to get it from home for him so I can assist with this therapy.  Patient also refusing IV access at this time  Review of Systems Otherwise negative except as per HPI, including: General = no fevers, chills, dizziness,  fatigue  HEENT/EYES = negative for loss of vision, double vision, blurred vision,  sore throa Cardiovascular= negative for chest pain, palpitation Respiratory/lungs= negative for shortness  of breath, cough, wheezing; hemoptysis,  Gastrointestinal= negative for nausea, vomiting, abdominal pain Genitourinary= negative for Dysuria MSK = Negative for arthralgia, myalgias Neurology= Negative for headache, numbness, tingling  Psychiatry= Negative for suicidal and homocidal ideation Skin= Negative for Rash   Examination: Constitutional: Not in acute distress Respiratory: Clear to auscultation bilaterally Cardiovascular: Normal sinus rhythm, no rubs Abdomen: Nontender nondistended good bowel sounds Musculoskeletal: No edema noted Skin: No rashes seen Neurologic: CN 2-12 grossly intact.  And nonfocal Psychiatric: Normal judgment and insight. Alert and oriented x 3. Normal mood.     Objective: Vitals:   09/08/19 0750 09/08/19 1612 09/09/19 0000 09/09/19 0736  BP: 114/74 121/77 (!) 88/64 101/64  Pulse: 61 66 72 68  Resp: 16 16 16 16   Temp: 98.2 F (36.8 C) 98.4 F (36.9 C) 98.2 F (36.8 C) 97.9 F (36.6 C)  TempSrc:   Oral   SpO2: 100% 100%  98%  Weight:      Height:        Intake/Output Summary (Last 24 hours) at 09/09/2019 1205 Last data filed at 09/08/2019 1600 Gross per 24 hour  Intake --  Output 650 ml  Net -650 ml   Filed Weights   09/01/19 0302 09/02/19 0500 09/03/19 0500  Weight: 75.5 kg 75.7 kg 74.1 kg     Data Reviewed:   CBC: No results for input(s): WBC, NEUTROABS, HGB, HCT, MCV, PLT in the last 168 hours. Basic Metabolic Panel: Recent Labs  Lab 09/03/19 0540  NA 140  K 3.7  CL 105  CO2 22  GLUCOSE 108*  BUN 20  CREATININE 0.95  CALCIUM 8.9   GFR: Estimated Creatinine Clearance: 81.3 mL/min (by C-G formula based on SCr of 0.95 mg/dL). Liver Function Tests: No results for input(s): AST, ALT, ALKPHOS, BILITOT, PROT, ALBUMIN in the last 168 hours. No results for input(s): LIPASE, AMYLASE in the last 168 hours. No results for input(s): AMMONIA in the last 168 hours. Coagulation Profile: No results for input(s): INR, PROTIME in the  last 168 hours. Cardiac Enzymes: No results for input(s): CKTOTAL, CKMB, CKMBINDEX, TROPONINI in the last 168 hours. BNP (last 3 results) No results for input(s): PROBNP in the last 8760 hours. HbA1C: No results for input(s): HGBA1C in the last 72 hours. CBG: Recent Labs  Lab 09/07/19 1203 09/07/19 1650 09/08/19 0751 09/08/19 2046 09/09/19 0733  GLUCAP 147* 90 101* 98 104*   Lipid Profile: No results for input(s): CHOL, HDL, LDLCALC, TRIG, CHOLHDL, LDLDIRECT in the last 72 hours. Thyroid Function Tests: No results for input(s): TSH, T4TOTAL, FREET4, T3FREE, THYROIDAB in the last 72 hours. Anemia Panel: No results for input(s): VITAMINB12, FOLATE, FERRITIN, TIBC, IRON, RETICCTPCT in the last 72 hours. Sepsis Labs: No results for input(s): PROCALCITON, LATICACIDVEN in the last 168 hours.  No results found for this or any previous visit (from the past 240 hour(s)).       Radiology Studies: No results found.      Scheduled Meds: . Chlorhexidine Gluconate Cloth  6 each Topical Daily  . feeding supplement (NEPRO CARB STEADY)  237 mL Oral Q24H  . heparin injection (subcutaneous)  5,000 Units Subcutaneous Q8H  . pantoprazole  40 mg Oral QHS  . polyethylene glycol  17 g Oral Daily  . potassium chloride  40 mEq Oral Daily  . senna-docusate  1 tablet Per  Tube Daily   Continuous Infusions: . sodium chloride Stopped (08/30/19 1659)  . sodium chloride    . sodium chloride    . sodium chloride       LOS: 17 days   Time spent= 25 mins    Gary Javid Arsenio Loader, MD [facility]  If 7PM-7AM, please contact night-coverage  09/09/2019, 12:05 PM

## 2019-09-09 NOTE — Progress Notes (Signed)
Physical Therapy Treatment Patient Details Name: Gary Davis MRN: [id number] DOB: May 02, 1956 Today's Date: 09/09/2019    History of Present Illness Gary Davis is a 64 y.o. male with a hx of cocaine and alcohol abuse, arthritis, chronic back pain, depression and GERD who is being seen today for the evaluation of elevated troponin and atrial fibrillation status post cardiac arrest at the request of Dr. Renford Dills.  Extubated 2/26.      PT Comments    Pt willing and motivated to work with therapy today requesting to get showered. Pt on room air and vitals WNL throughout tx. Pt transfers supine>sit mod I and sit<>stand with RW min guard. Pt ambulates into the shower with min guard with frequent RW cuing for safety and appropriate use. Pt stands for donning/ doffing of gown. He is able to maintain standing without UE support briefly, however is more steady with 1-2 UE support. Refer to OT note for bathing and ADLs in shower. Pt ambulates 100' with RW and min guard to min A when turning with RW. Pt requires cues to avoid lifting RW up off of the floor when turning. Pt reports having difficulty seeing and is unable to read the sign on the wall 5' away. Pt reports wearing glasses at home and recommended pt family member to bring in glasses. MD made aware of balance concerns due to visual deficits. Pt would benefit from continued gait training with RW for safety and balance interventions. D/C location remains appropriate. PT will continue to follow acutely.    Follow Up Recommendations  SNF;Supervision/Assistance - 24 hour     Equipment Recommendations  Other (comment)(TBD at next venue)       Precautions / Restrictions Precautions Precautions: Fall Precaution Comments:   Restrictions Weight Bearing Restrictions: No    Mobility  Bed Mobility Overal bed mobility: Modified Independent Bed Mobility: Supine to Sit;Sit to Supine     Supine to sit: Modified independent (Device/Increase  time);HOB elevated Sit to supine: Modified independent (Device/Increase time);HOB elevated   General bed mobility comments: mod I for HOB raises and increased time  Transfers Overall transfer level: Needs assistance Equipment used: Rolling walker (2 wheeled) Transfers: Sit to/from Stand Sit to Stand: Min guard         General transfer comment: Pt quick to stand at [employer] I before therapist readiness. Min guard for safety  Ambulation/Gait Ambulation/Gait assistance: Min guard;Min assist;+2 safety/equipment Gait Distance (Feet): 100 Feet(10' in the room, 100' out into hallway and back) Assistive device: Rolling walker (2 wheeled) Gait Pattern/deviations: Step-through pattern;Decreased step length - left;Decreased stride length;Antalgic;Decreased weight shift to left;Decreased step length - right(R antalgic) Gait velocity: decreased Gait velocity interpretation: 1.31 - 2.62 ft/sec, indicative of limited community ambulator General Gait Details: min A when turning with RW due to pt trying to pick up RW and demonstrating unsteadiness.           Balance Overall balance assessment: Needs assistance Sitting-balance support: Feet supported Sitting balance-Leahy Scale: Good Sitting balance - Comments: able to sit EOB without PT assist   Standing balance support: Bilateral upper extremity supported;During functional activity Standing balance-Leahy Scale: Fair Standing balance comment: Pt is able to stand briefly without UE support when bathing however with any ambulation he is reliant on UE support                            Cognition Arousal/Alertness: Awake/alert Behavior During Therapy: Impulsive(quick to  move before cuing) Overall Cognitive Status: Impaired/Different from baseline Area of Impairment: Following commands;Attention;Safety/judgement;Awareness;Problem solving                   Current Attention Level: Focused   Following Commands: Follows one  step commands consistently;Follows one step commands with increased time;Follows multi-step commands inconsistently Safety/Judgement: Decreased awareness of safety;Decreased awareness of deficits Awareness: Emergent Problem Solving: Slow processing;Decreased initiation;Difficulty sequencing;Requires verbal cues General Comments: Pt demonstrates improved behavior and cognition during today's session. He is motivated and willing to work with therapy due to wanting to get a shower.         General Comments General comments (skin integrity, edema, etc.): Vitals WNL throughout. Pt on room air.       Pertinent Vitals/Pain Pain Assessment: Faces Faces Pain Scale: Hurts a little bit Pain Location: headache Pain Descriptors / Indicators: Grimacing;Headache Pain Intervention(s): Patient requesting pain meds-RN notified;Monitored during session           PT Goals (current goals can now be found in the care plan section) Acute Rehab PT Goals Patient Stated Goal: to get out of hospital PT Goal Formulation: With patient Time For Goal Achievement: 09/13/19 Potential to Achieve Goals: Good Progress towards PT goals: Progressing toward goals    Frequency    Min 2X/week      PT Plan Current plan remains appropriate    Co-evaluation   Reason for Co-Treatment: For patient/therapist safety PT goals addressed during session: Mobility/safety with mobility;Balance;Proper use of DME        AM-PAC PT "6 Clicks" Mobility   Outcome Measure  Help needed turning from your back to your side while in a flat bed without using bedrails?: A Little Help needed moving from lying on your back to sitting on the side of a flat bed without using bedrails?: A Little Help needed moving to and from a bed to a chair (including a wheelchair)?: A Little Help needed standing up from a chair using your arms (e.g., wheelchair or bedside chair)?: A Little Help needed to walk in hospital room?: A Little Help  needed climbing 3-5 steps with a railing? : A Lot 6 Click Score: 17    End of Session Equipment Utilized During Treatment: Gait belt Activity Tolerance: Patient tolerated treatment well Patient left: in bed;with call bell/phone within reach Nurse Communication: Mobility status PT Visit Diagnosis: Unsteadiness on feet (R26.81);Muscle weakness (generalized) (M62.81)     Time: 5176-1607 PT Time Calculation (min) (ACUTE ONLY): 37 min  Charges:  $Gait Training: 8-22 mins                     Jodelle Green, PT, DPT Acute Rehabilitation Services Office [phone]   Jodelle Green 09/09/2019, 12:00 PM

## 2019-09-09 NOTE — Progress Notes (Signed)
Occupational Therapy Treatment Patient Details Name: Gary Davis MRN: [id number] DOB: 1955/10/09 Today's Date: 09/09/2019    History of present illness Gary Davis is a 64 y.o. male with a hx of cocaine and alcohol abuse, arthritis, chronic back pain, depression and GERD who is being seen today for the evaluation of elevated troponin and atrial fibrillation status post cardiac arrest at the request of Dr. Tawanna Solo.  Extubated 2/26.     OT comments  Patient showing great improvement in ability and motivation today since previous visits.  He was willing to work with therapy and wanted to take a shower.  Patient required min guard with mobility and shower transfer, using RW and needing cueing to for safety as he moves quickly.  Completed shower mostly seated on 3-in-1 but able to stand as well with use of grab bars.  He demonstrated improved safety awareness today as well, indicating he knew he needed to be careful in shower.  Still recommending SNF as patient is too unsteady with functional mobility to return home safely and requiring cueing for safety.  If patient mobility improves with glasses, HHOT may be considered.  Will continue to follow with OT acutely to address the deficits listed below.    Follow Up Recommendations  SNF;Supervision/Assistance - 24 hour    Equipment Recommendations  None recommended by OT    Recommendations for Other Services      Precautions / Restrictions Precautions Precautions: Fall Precaution Comments:   Restrictions Weight Bearing Restrictions: No       Mobility Bed Mobility Overal bed mobility: Modified Independent Bed Mobility: Supine to Sit;Sit to Supine     Supine to sit: Modified independent (Device/Increase time);HOB elevated Sit to supine: Modified independent (Device/Increase time);HOB elevated   General bed mobility comments: mod I for HOB raises and increased time  Transfers Overall transfer level: Needs  assistance Equipment used: Rolling walker (2 wheeled) Transfers: Sit to/from Stand Sit to Stand: Min guard         General transfer comment: Pt quick to stand at [employer] I before therapist readiness. Min guard for safety    Balance Overall balance assessment: Needs assistance Sitting-balance support: Feet supported Sitting balance-Leahy Scale: Good Sitting balance - Comments: able to bend over in chair and put on socks   Standing balance support: Bilateral upper extremity supported;During functional activity Standing balance-Leahy Scale: Fair Standing balance comment: Pt is able to stand briefly without UE support when bathing however with any ambulation he is reliant on UE support                           ADL either performed or assessed with clinical judgement   ADL Overall ADL's : Needs assistance/impaired         Upper Body Bathing: Supervision/ safety;Sitting   Lower Body Bathing: Supervison/ safety;Sit to/from stand   Upper Body Dressing : Set up;Standing   Lower Body Dressing: Set up;Sitting/lateral leans           Tub/ Shower Transfer: Min guard;Rolling walker;Cueing for sequencing   Functional mobility during ADLs: Min guard;+2 for safety/equipment;Rolling walker General ADL Comments: Patient completed full body bath in shower     Vision   Vision Assessment?: Yes Additional Comments: Every thing past 16in out is "very dark and blurry". Patient typically wears glasses but does not have.  He was unable to read sing 2 ft away.  Vision may be having impact on his  balance   Perception     Praxis      Cognition Arousal/Alertness: Awake/alert Behavior During Therapy: Impulsive Overall Cognitive Status: No family/caregiver present to determine baseline cognitive functioning Area of Impairment: Following commands;Attention;Safety/judgement;Awareness;Problem solving                   Current Attention Level: Selective   Following  Commands: Follows one step commands consistently;Follows one step commands with increased time;Follows multi-step commands inconsistently Safety/Judgement: Decreased awareness of safety;Decreased awareness of deficits Awareness: Emergent Problem Solving: Slow processing;Decreased initiation;Difficulty sequencing;Requires verbal cues General Comments: Pt demonstrates improved behavior and cognition during today's session. He is motivated and willing to work with therapy due to wanting to get a shower.        Exercises     Shoulder Instructions       General Comments Vitals WNL throughout. Pt on room air.     Pertinent Vitals/ Pain       Pain Assessment: Faces Faces Pain Scale: Hurts a little bit Pain Location: headache Pain Descriptors / Indicators: Grimacing;Headache Pain Intervention(s): Monitored during session;Patient requesting pain meds-RN notified  Home Living                                          Prior Functioning/Environment              Frequency  Min 2X/week        Progress Toward Goals  OT Goals(current goals can now be found in the care plan section)  Progress towards OT goals: Progressing toward goals  Acute Rehab OT Goals Patient Stated Goal: to get out of hospital OT Goal Formulation: With patient Time For Goal Achievement: 09/15/19 Potential to Achieve Goals: Good  Plan Discharge plan remains appropriate    Co-evaluation      Reason for Co-Treatment: Necessary to address cognition/behavior during functional activity;For patient/therapist safety PT goals addressed during session: Mobility/safety with mobility;Balance;Proper use of DME OT goals addressed during session: ADL's and self-care      AM-PAC OT "6 Clicks" Daily Activity     Outcome Measure   Help from another person eating meals?: None Help from another person taking care of personal grooming?: A Little Help from another person toileting, which includes  using toliet, bedpan, or urinal?: A Little Help from another person bathing (including washing, rinsing, drying)?: A Little Help from another person to put on and taking off regular upper body clothing?: A Little Help from another person to put on and taking off regular lower body clothing?: A Little 6 Click Score: 19    End of Session Equipment Utilized During Treatment: Rolling walker;Gait belt  OT Visit Diagnosis: Unsteadiness on feet (R26.81);Muscle weakness (generalized) (M62.81);Pain   Activity Tolerance Patient tolerated treatment well   Patient Left in bed;with call bell/phone within reach   Nurse Communication Mobility status        Time: 6948-5462 OT Time Calculation (min): 38 min  Charges: OT General Charges $OT Visit: 1 Visit OT Treatments $Self Care/Home Management : 23-37 mins  Barbie Banner, OTR/L    Adella Hare 09/09/2019, 2:42 PM

## 2019-09-09 NOTE — Progress Notes (Signed)
MD made aware pt did not iv access and did not want one.

## 2019-09-10 LAB — GLUCOSE, CAPILLARY: Glucose-Capillary: 158 mg/dL — ABNORMAL HIGH (ref 70–99)

## 2019-09-10 NOTE — Progress Notes (Signed)
PROGRESS NOTE    CLAXTON LEVITZ  BJS:[id number] DOB: 1955/08/25 DOA: 08/23/2019 PCP: Medicine, [facility]   Brief Narrative:  64 year old with history of cocaine/alcohol use, depression initially found unresponsive by his roommate brought to the hospital requiring 1 round of CPR.  Had ROSC but subsequently intubated.  Most of the work-up was negative but his echocardiogram showed EF of 30% with moderate to severe hypokinesia seen by cardiology team.  Nuclear scan showed normal perfusion with low risk study per cardiology.  Cleared by cardiology team for discharge.  PT recommended SNF, currently [facility] team working on this.   Assessment & Plan:   Active Problems:   Tobacco use disorder   Arthritis   Osteoarthritis of right knee   Acute respiratory failure (HCC)   Cardiac arrest (HCC)   Acute systolic CHF (congestive heart failure) (HCC)  Cardiac arrest likely secondary to drug use Congestive heart failure with reduced ejection fraction, 30% with global hypokinesia -Nuclear stress test performed by cardiology on 2/20 showed low risk study.  Cleared for discharge at this time.  Repeating echocardiogram in 6-8 weeks  Acute hypoxic/hypercapnic respiratory failure with aspiration pneumonitis, stable 1.4 cm pulmonary nodule -MSSA in the sputum.  Has left 10th rib fracture. -Completed Keflex 3/3 -Would benefit from outpatient pulmonary evaluation and routine follow-up as appropriate.  Repeat outpatient CT in 3 months  Acute metabolic encephalopathy, greatly improved -Suspect from anoxic brain injury during his cardiac arrest. -Patient has capacity to make his decisions in my evaluation.  Appreciate psychiatry input.  Left perinephric hematoma -Appears to be stable per renal ultrasound on 2/25  History of arthritis/cervical fusion -Pain management  Right wrist pain/deformity -Likely from bone bruising.  X-rays are negative.  Mild dehydration -Lack of IV access,  patient refusing IV at this time.  Encourage oral hydration.  Working with speech and swallow team-recommending regular diet with thin liquids  PT/OT evaluation performed.  SNF recommended.  SNF appropriate as the patient has received 3 days of hospital care (or the 3 days stay Has been made by the patient's insurance company) and is felt to need rehab services to restore this patient to their prior level of function to achieve safe transition back to home care.  This patient needs rehab services for at least 5 days/week and skilled nursing services daily to facilitate this transition.  Rehab is been requested as the most appropriate discharge option for this patient and is not felt to be custodial care as evidenced by his previous independence as he lives by himself at his house.  Currently with his improvement in his mentation, he would be a great rehabilitation candidate.  Working with physical therapy-continues to require a lot of assistance and still needs skilled nursing facility.   DVT prophylaxis: Subcutaneous heparin Code Status: Full code Family Communication: Spoke with his brother [name] at home while was in the room with the patient Disposition Plan:   Patient From= home  Patient Anticipated D/C place= SNF  Barriers= medically stable, pending placement.  Subjective: Patient denies any complaints, no acute events overnight.  He has been cooperative with physical therapy staff.  I spoke with his brother over the phone while I was in the room, he still trying to get hold of his landlord so he can get in his apartment to obtain his glasses and bring it to the hospital  Review of Systems Otherwise negative except as per HPI, including: General = no fevers, chills, dizziness,  fatigue HEENT/EYES =  negative for loss of vision, double vision, blurred vision,  sore throa Cardiovascular= negative for chest pain, palpitation Respiratory/lungs= negative for shortness of breath, cough,  wheezing; hemoptysis,  Gastrointestinal= negative for nausea, vomiting, abdominal pain Genitourinary= negative for Dysuria MSK = Negative for arthralgia, myalgias Neurology= Negative for headache, numbness, tingling  Psychiatry= Negative for suicidal and homocidal ideation Skin= Negative for Rash    Examination: Constitutional: Not in acute distress Respiratory: Clear to auscultation bilaterally Cardiovascular: Normal sinus rhythm, no rubs Abdomen: Nontender nondistended good bowel sounds Musculoskeletal: No edema noted Skin: No rashes seen Neurologic: CN 2-12 grossly intact.  And nonfocal Psychiatric: Normal judgment and insight. Alert and oriented x 3. Normal mood.      Objective: Vitals:   09/09/19 0736 09/09/19 1524 09/09/19 1942 09/10/19 0850  BP: 101/64 96/71 93/71  102/65  Pulse: 68 90 84 75  Resp: 16 16  17   Temp: 97.9 F (36.6 C) 98.8 F (37.1 C) 98.3 F (36.8 C) 97.8 F (36.6 C)  TempSrc:   Oral   SpO2: 98% 99% 100% 100%  Weight:      Height:        Intake/Output Summary (Last 24 hours) at 09/10/2019 1049 Last data filed at 09/10/2019 0013 Gross per 24 hour  Intake 120 ml  Output --  Net 120 ml   Filed Weights   09/01/19 0302 09/02/19 0500 09/03/19 0500  Weight: 75.5 kg 75.7 kg 74.1 kg     Data Reviewed:   CBC: No results for input(s): WBC, NEUTROABS, HGB, HCT, MCV, PLT in the last 168 hours. Basic Metabolic Panel: No results for input(s): NA, K, CL, CO2, GLUCOSE, BUN, CREATININE, CALCIUM, MG, PHOS in the last 168 hours. GFR: Estimated Creatinine Clearance: 81.3 mL/min (by C-G formula based on SCr of 0.95 mg/dL). Liver Function Tests: No results for input(s): AST, ALT, ALKPHOS, BILITOT, PROT, ALBUMIN in the last 168 hours. No results for input(s): LIPASE, AMYLASE in the last 168 hours. No results for input(s): AMMONIA in the last 168 hours. Coagulation Profile: No results for input(s): INR, PROTIME in the last 168 hours. Cardiac Enzymes: No  results for input(s): CKTOTAL, CKMB, CKMBINDEX, TROPONINI in the last 168 hours. BNP (last 3 results) No results for input(s): PROBNP in the last 8760 hours. HbA1C: No results for input(s): HGBA1C in the last 72 hours. CBG: Recent Labs  Lab 09/08/19 0751 09/08/19 2046 09/09/19 0733 09/09/19 2014 09/10/19 0852  GLUCAP 101* 98 104* 107* 158*   Lipid Profile: No results for input(s): CHOL, HDL, LDLCALC, TRIG, CHOLHDL, LDLDIRECT in the last 72 hours. Thyroid Function Tests: No results for input(s): TSH, T4TOTAL, FREET4, T3FREE, THYROIDAB in the last 72 hours. Anemia Panel: No results for input(s): VITAMINB12, FOLATE, FERRITIN, TIBC, IRON, RETICCTPCT in the last 72 hours. Sepsis Labs: No results for input(s): PROCALCITON, LATICACIDVEN in the last 168 hours.  No results found for this or any previous visit (from the past 240 hour(s)).       Radiology Studies: No results found.      Scheduled Meds: . Chlorhexidine Gluconate Cloth  6 each Topical Daily  . enoxaparin (LOVENOX) injection  40 mg Subcutaneous Q24H  . feeding supplement (NEPRO CARB STEADY)  237 mL Oral Q24H  . pantoprazole  40 mg Oral QHS  . polyethylene glycol  17 g Oral Daily  . potassium chloride  40 mEq Oral Daily  . senna-docusate  1 tablet Per Tube Daily   Continuous Infusions: . sodium chloride Stopped (08/30/19 1659)  .  sodium chloride    . sodium chloride    . sodium chloride       LOS: 18 days   Time spent= 25 mins    Zondra Lawlor Arsenio Loader, MD [facility]  If 7PM-7AM, please contact night-coverage  09/10/2019, 10:49 AM

## 2019-09-11 LAB — GLUCOSE, CAPILLARY: Glucose-Capillary: 114 mg/dL — ABNORMAL HIGH (ref 70–99)

## 2019-09-11 MED ORDER — IBUPROFEN 200 MG PO TABS
400.0000 mg | ORAL_TABLET | Freq: Once | ORAL | Status: AC
  Administered 2019-09-11: 400 mg via ORAL
  Filled 2019-09-11: qty 2

## 2019-09-11 NOTE — Progress Notes (Signed)
PROGRESS NOTE    Gary Davis  ZCH:[id number] DOB: 1955-09-29 DOA: 08/23/2019 PCP: Medicine, [facility]   Brief Narrative:  64 year old with history of cocaine/alcohol use, depression initially found unresponsive by his roommate brought to the hospital requiring 1 round of CPR.  Had ROSC but subsequently intubated.  Most of the work-up was negative but his echocardiogram showed EF of 30% with moderate to severe hypokinesia seen by cardiology team.  Nuclear scan showed normal perfusion with low risk study per cardiology.  Cleared by cardiology team for discharge.  PT recommended SNF, currently [facility] team working on this.   Assessment & Plan:   Active Problems:   Tobacco use disorder   Arthritis   Osteoarthritis of right knee   Acute respiratory failure (HCC)   Cardiac arrest (HCC)   Acute systolic CHF (congestive heart failure) (HCC)  Cardiac arrest likely secondary to drug use Congestive heart failure with reduced ejection fraction, 30% with global hypokinesia -Nuclear stress test performed by cardiology on 2/20 showed low risk study.  Cleared for discharge at this time.  Repeating echocardiogram in 6-8 weeks  Acute hypoxic/hypercapnic respiratory failure with aspiration pneumonitis, stable 1.4 cm pulmonary nodule -MSSA in the sputum.  Has left 10th rib fracture. -Completed Keflex 3/3 -Would benefit from outpatient pulmonary evaluation and routine follow-up as appropriate.  Repeat outpatient CT in 3 months  Acute metabolic encephalopathy, greatly improved -Suspect from anoxic brain injury during his cardiac arrest. -Patient has capacity to make his decisions in my evaluation.  Appreciate psychiatry input.  Left perinephric hematoma -Appears to be stable per renal ultrasound on 2/25  History of arthritis/cervical fusion -Pain management  Right wrist pain/deformity -Likely from bone bruising.  X-rays are negative.  Mild dehydration -Lack of IV access,  patient refusing IV at this time.  Encourage oral hydration.  Working with speech and swallow team-recommending regular diet with thin liquids  PT/OT evaluation performed.  SNF recommended.  SNF appropriate as the patient has received 3 days of hospital care (or the 3 days stay Has been made by the patient's insurance company) and is felt to need rehab services to restore this patient to their prior level of function to achieve safe transition back to home care.  This patient needs rehab services for at least 5 days/week and skilled nursing services daily to facilitate this transition.  Rehab is been requested as the most appropriate discharge option for this patient and is not felt to be custodial care as evidenced by his previous independence as he lives by himself at his house.  Currently with his improvement in his mentation, he would be a great rehabilitation candidate.  Working with physical therapy-continues to require a lot of assistance and still needs skilled nursing facility.   DVT prophylaxis: Subcutaneous heparin Code Status: Full code Family Communication: No answer Disposition Plan:   Patient From= home  Patient Anticipated D/C place= SNF  Barriers= medically stable, pending placement.  Subjective: Patient is complaining of his bilateral lower extremity toenails which are bothering him.  I brought this to nursing staff attention to help him clip his toenails. Still unable to reach his landlord  Review of Systems Otherwise negative except as per HPI, including: General = no fevers, chills, dizziness,  fatigue HEENT/EYES = negative for loss of vision, double vision, blurred vision,  sore throa Cardiovascular= negative for chest pain, palpitation Respiratory/lungs= negative for shortness of breath, cough, wheezing; hemoptysis,  Gastrointestinal= negative for nausea, vomiting, abdominal pain Genitourinary= negative for  Dysuria MSK = Negative for arthralgia,  myalgias Neurology= Negative for headache, numbness, tingling  Psychiatry= Negative for suicidal and homocidal ideation Skin= Negative for Rash   Examination: Constitutional: Not in acute distress Respiratory: Clear to auscultation bilaterally Cardiovascular: Normal sinus rhythm, no rubs Abdomen: Nontender nondistended good bowel sounds Musculoskeletal: Strength in lower extremities 4/5 Skin: No rashes seen Neurologic: CN 2-12 grossly intact.  And nonfocal Psychiatric: Normal judgment and insight. Alert and oriented x 3. Normal mood.    Objective: Vitals:   09/10/19 1939 09/10/19 2113 09/11/19 0544 09/11/19 0820  BP: (!) 86/63 (!) 91/57  99/70  Pulse: 72 73  70  Resp:    17  Temp: 98.7 F (37.1 C)   97.9 F (36.6 C)  TempSrc: Oral     SpO2: 97%   100%  Weight:   71.7 kg   Height:        Intake/Output Summary (Last 24 hours) at 09/11/2019 1053 Last data filed at 09/10/2019 2100 Gross per 24 hour  Intake 240 ml  Output --  Net 240 ml   Filed Weights   09/02/19 0500 09/03/19 0500 09/11/19 0544  Weight: 75.7 kg 74.1 kg 71.7 kg     Data Reviewed:   CBC: No results for input(s): WBC, NEUTROABS, HGB, HCT, MCV, PLT in the last 168 hours. Basic Metabolic Panel: No results for input(s): NA, K, CL, CO2, GLUCOSE, BUN, CREATININE, CALCIUM, MG, PHOS in the last 168 hours. GFR: Estimated Creatinine Clearance: 78.6 mL/min (by C-G formula based on SCr of 0.95 mg/dL). Liver Function Tests: No results for input(s): AST, ALT, ALKPHOS, BILITOT, PROT, ALBUMIN in the last 168 hours. No results for input(s): LIPASE, AMYLASE in the last 168 hours. No results for input(s): AMMONIA in the last 168 hours. Coagulation Profile: No results for input(s): INR, PROTIME in the last 168 hours. Cardiac Enzymes: No results for input(s): CKTOTAL, CKMB, CKMBINDEX, TROPONINI in the last 168 hours. BNP (last 3 results) No results for input(s): PROBNP in the last 8760 hours. HbA1C: No results for  input(s): HGBA1C in the last 72 hours. CBG: Recent Labs  Lab 09/08/19 2046 09/09/19 0733 09/09/19 2014 09/10/19 0852 09/11/19 0822  GLUCAP 98 104* 107* 158* 114*   Lipid Profile: No results for input(s): CHOL, HDL, LDLCALC, TRIG, CHOLHDL, LDLDIRECT in the last 72 hours. Thyroid Function Tests: No results for input(s): TSH, T4TOTAL, FREET4, T3FREE, THYROIDAB in the last 72 hours. Anemia Panel: No results for input(s): VITAMINB12, FOLATE, FERRITIN, TIBC, IRON, RETICCTPCT in the last 72 hours. Sepsis Labs: No results for input(s): PROCALCITON, LATICACIDVEN in the last 168 hours.  No results found for this or any previous visit (from the past 240 hour(s)).       Radiology Studies: No results found.      Scheduled Meds: . Chlorhexidine Gluconate Cloth  6 each Topical Daily  . enoxaparin (LOVENOX) injection  40 mg Subcutaneous Q24H  . feeding supplement (NEPRO CARB STEADY)  237 mL Oral Q24H  . pantoprazole  40 mg Oral QHS  . polyethylene glycol  17 g Oral Daily  . potassium chloride  40 mEq Oral Daily  . senna-docusate  1 tablet Per Tube Daily   Continuous Infusions: . sodium chloride Stopped (08/30/19 1659)  . sodium chloride    . sodium chloride    . sodium chloride       LOS: 19 days   Time spent= 25 mins    Christpher Stogsdill Joline Maxcy, MD [facility]  If 7PM-7AM, please  contact night-coverage  09/11/2019, 10:53 AM

## 2019-09-11 NOTE — Progress Notes (Signed)
Physical Therapy Treatment Patient Details Name: Gary Davis MRN: [id number] DOB: 08-Apr-1956 Today's Date: 09/11/2019    History of Present Illness Gary Davis is a 64 y.o. male with a hx of cocaine and alcohol abuse, arthritis, chronic back pain, depression and GERD who is being seen today for the evaluation of elevated troponin and atrial fibrillation status post cardiac arrest at the request of Dr. Renford Dills.  Extubated 2/26.      PT Comments    Pt is resistant to getting up OOB however willing to participate with encouragement. Pt is making good progress and demonstrated improved balance and RW safety today. Pt performs bed mobility mod I, stand pivots to commode without an AD with supervision, and ambulates 200' with RW with min guard for safety. He is willing to do LE strengthening therapeutic exercise in bed however unwilling to sit up in recliner. D/C plans remain appropriate. PT will continue to follow acutely.    Follow Up Recommendations  SNF;Supervision/Assistance - 24 hour     Equipment Recommendations  Rolling walker with 5" wheels       Precautions / Restrictions Precautions Precautions: Fall Restrictions Weight Bearing Restrictions: No    Mobility  Bed Mobility Overal bed mobility: Modified Independent Bed Mobility: Supine to Sit;Sit to Supine     Supine to sit: Modified independent (Device/Increase time);HOB elevated Sit to supine: Modified independent (Device/Increase time);HOB elevated   General bed mobility comments: mod I for [facility] raises and increased time  Transfers Overall transfer level: Needs assistance Equipment used: Rolling walker (2 wheeled);None Transfers: Sit to/from Stand Sit to Stand: Supervision Stand pivot transfers: Supervision       General transfer comment: Pt stand pivots without AD with supervision to commode. Pt stands at [facility] with supervision. Pt is quick to stand and at times requires cues for safe RW usage.    Ambulation/Gait Ambulation/Gait assistance: Min guard Gait Distance (Feet): 200 Feet Assistive device: Rolling walker (2 wheeled) Gait Pattern/deviations: Step-through pattern;Decreased step length - left;Decreased stride length;Antalgic;Decreased weight shift to left;Decreased step length - right Gait velocity: decreased Gait velocity interpretation: 1.31 - 2.62 ft/sec, indicative of limited community ambulator General Gait Details: Min guard; pt turns quickly using choppy steps versus a solid slow turn with RW. Cuing for safety.         Balance Overall balance assessment: Needs assistance Sitting-balance support: Feet supported Sitting balance-Leahy Scale: Good Sitting balance - Comments: able to bend over in chair and put on socks   Standing balance support: Bilateral upper extremity supported;No upper extremity supported;During functional activity Standing balance-Leahy Scale: Fair Standing balance comment: Pt able to stand and transfer without UE support. However, pt is most steady and more secure when standing with B UE support.                            Cognition Arousal/Alertness: Awake/alert Behavior During Therapy: Impulsive Overall Cognitive Status: No family/caregiver present to determine baseline cognitive functioning Area of Impairment: Following commands;Attention;Safety/judgement;Awareness;Problem solving                 Orientation Level: Disoriented to;Situation;Place Current Attention Level: Selective   Following Commands: Follows one step commands consistently;Follows one step commands with increased time;Follows multi-step commands inconsistently Safety/Judgement: Decreased awareness of safety;Decreased awareness of deficits Awareness: Emergent Problem Solving: Slow processing;Decreased initiation;Difficulty sequencing;Requires verbal cues General Comments: Pt impulsive and is most concerned about going home and complaining about his  care/ not  being left alone. However, is willing to work with therapy with encouragement.      Exercises General Exercises - Lower Extremity Heel Slides: AROM;Both;10 reps;Supine Straight Leg Raises: AROM;Both;10 reps;Supine Other Exercises Other Exercises: bridges supine in bed x 10. cues for breathing    General Comments General comments (skin integrity, edema, etc.): Vitals WNL. Pt requires increased time for willlingness to participate in therapy.      Pertinent Vitals/Pain Pain Assessment: Faces Faces Pain Scale: Hurts a little bit Pain Location: headache Pain Descriptors / Indicators: Grimacing;Headache Pain Intervention(s): Monitored during session           PT Goals (current goals can now be found in the care plan section) Acute Rehab PT Goals Patient Stated Goal: to go home PT Goal Formulation: With patient Time For Goal Achievement: 09/13/19 Potential to Achieve Goals: Good Progress towards PT goals: Progressing toward goals    Frequency    Min 2X/week      PT Plan Current plan remains appropriate    Co-evaluation   Reason for Co-Treatment: For patient/therapist safety PT goals addressed during session: Mobility/safety with mobility OT goals addressed during session: ADL's and self-care      AM-PAC PT "6 Clicks" Mobility   Outcome Measure  Help needed turning from your back to your side while in a flat bed without using bedrails?: A Little Help needed moving from lying on your back to sitting on the side of a flat bed without using bedrails?: A Little Help needed moving to and from a bed to a chair (including a wheelchair)?: A Little Help needed standing up from a chair using your arms (e.g., wheelchair or bedside chair)?: A Little Help needed to walk in hospital room?: A Little Help needed climbing 3-5 steps with a railing? : A Lot 6 Click Score: 17    End of Session Equipment Utilized During Treatment: Gait belt Activity Tolerance: Patient  tolerated treatment well Patient left: in bed;with call bell/phone within reach Nurse Communication: Mobility status PT Visit Diagnosis: Unsteadiness on feet (R26.81);Muscle weakness (generalized) (M62.81)     Time: 2376-2831 PT Time Calculation (min) (ACUTE ONLY): 24 min  Charges:  $Therapeutic Activity: 8-22 mins                     Jodelle Green, PT, DPT Acute Rehabilitation Services Office [phone]   Jodelle Green 09/11/2019, 11:36 AM

## 2019-09-11 NOTE — Progress Notes (Signed)
Pt BP 89/65, other VS WDL. He denied SOB, chest pain, dizziness, no change in mental status. Pt c/o headache from noise caused by bed alarm.  [place], NP notified.  No new orders at this time. Pt remains asymptomatic.

## 2019-09-11 NOTE — Progress Notes (Signed)
Occupational Therapy Treatment Patient Details Name: JESE COMELLA MRN: [id number] DOB: 1955/12/31 Today's Date: 09/11/2019    History of present illness DARION MILEWSKI is a 64 y.o. male with a hx of cocaine and alcohol abuse, arthritis, chronic back pain, depression and GERD who is being seen today for the evaluation of elevated troponin and atrial fibrillation status post cardiac arrest at the request of Dr. Renford Dills.  Extubated 2/26.     OT comments  Patient in bed on arrival.  Needing encouragement to participate in therapy and did so reluctantly but better than previous visits.  He completed toilet transfer and toileting with supervision, and well as LE dressing.  He was able to walk ~278ft with RW and min guard, but still slightly unsteady.  Do not feel patient is safe to return home independently yet, though patient is improving.  Will continue to follow with OT acutely to address the deficits listed below.    Follow Up Recommendations  SNF;Supervision/Assistance - 24 hour    Equipment Recommendations  None recommended by OT    Recommendations for Other Services      Precautions / Restrictions Precautions Precautions: Fall Restrictions Weight Bearing Restrictions: No       Mobility Bed Mobility Overal bed mobility: Modified Independent Bed Mobility: Supine to Sit;Sit to Supine     Supine to sit: Modified independent (Device/Increase time);HOB elevated Sit to supine: Modified independent (Device/Increase time);HOB elevated   General bed mobility comments: mod I for [facility] raises and increased time  Transfers Overall transfer level: Needs assistance Equipment used: Rolling walker (2 wheeled);None Transfers: Sit to/from Stand Sit to Stand: Supervision         General transfer comment: Pt stand pivots without AD with supervision to commode. Pt stands at [facility] with supervision. Pt is quick to stand and at times requires cues for safe RW usage.     Balance  Overall balance assessment: Needs assistance Sitting-balance support: Feet supported Sitting balance-Leahy Scale: Good Sitting balance - Comments: able to bend over in chair and put on socks   Standing balance support: Bilateral upper extremity supported;No upper extremity supported;During functional activity Standing balance-Leahy Scale: Fair Standing balance comment: Pt able to stand and transfer without UE support. However, pt is most steady and more secure when standing with B UE support.                           ADL either performed or assessed with clinical judgement   ADL Overall ADL's : Needs assistance/impaired Eating/Feeding: Set up;Sitting                   Lower Body Dressing: Supervision/safety;Sit to/from stand   Toilet Transfer: Supervision/safety;Ambulation;BSC   Toileting- Clothing Manipulation and Hygiene: Set up;Sit to/from stand       Functional mobility during ADLs: Min guard;Rolling walker General ADL Comments: Patient needs reinforcement to use cup given by speech therapist.  Not supposed to use straws but many straws found in room.  Removed straws and notified nursing.     Vision       Perception     Praxis      Cognition Arousal/Alertness: Awake/alert Behavior During Therapy: Impulsive Overall Cognitive Status: No family/caregiver present to determine baseline cognitive functioning Area of Impairment: Following commands;Attention;Safety/judgement;Awareness;Problem solving                 Orientation Level: Disoriented to;Situation Current Attention Level: Selective   Following  Commands: Follows one step commands consistently;Follows one step commands with increased time;Follows multi-step commands inconsistently Safety/Judgement: Decreased awareness of safety;Decreased awareness of deficits Awareness: Emergent Problem Solving: Slow processing;Decreased initiation;Difficulty sequencing;Requires verbal cues General  Comments: Pt impulsive and is most concerned about going home and complaining about his care/ not being left alone. However, is willing to work with therapy with encouragement. Patient did state that if we don't let him leave soon then "its gonna be bad and we are going to have to bring the police in here"        Exercises     Shoulder Instructions       General Comments      Pertinent Vitals/ Pain       Pain Assessment: Faces Faces Pain Scale: Hurts a little bit Pain Location: headache Pain Descriptors / Indicators: Grimacing;Headache Pain Intervention(s): Monitored during session  Home Living                                          Prior Functioning/Environment              Frequency  Min 2X/week        Progress Toward Goals  OT Goals(current goals can now be found in the care plan section)  Progress towards OT goals: Progressing toward goals  Acute Rehab OT Goals Patient Stated Goal: to go home OT Goal Formulation: With patient Time For Goal Achievement: 09/15/19 Potential to Achieve Goals: Good  Plan Discharge plan remains appropriate    Co-evaluation    PT/OT/SLP Co-Evaluation/Treatment: Yes Reason for Co-Treatment: For patient/therapist safety   OT goals addressed during session: ADL's and self-care      AM-PAC OT "6 Clicks" Daily Activity     Outcome Measure   Help from another person eating meals?: None Help from another person taking care of personal grooming?: A Little Help from another person toileting, which includes using toliet, bedpan, or urinal?: A Little Help from another person bathing (including washing, rinsing, drying)?: A Little Help from another person to put on and taking off regular upper body clothing?: A Little Help from another person to put on and taking off regular lower body clothing?: A Little 6 Click Score: 19    End of Session Equipment Utilized During Treatment: Rolling walker;Gait belt  OT  Visit Diagnosis: Unsteadiness on feet (R26.81);Muscle weakness (generalized) (M62.81);Pain   Activity Tolerance Patient tolerated treatment well   Patient Left in bed;with call bell/phone within reach   Nurse Communication Mobility status        Time: 1610-9604 OT Time Calculation (min): 24 min  Charges: OT General Charges $OT Visit: 1 Visit OT Treatments $Self Care/Home Management : 8-22 mins  August Luz, OTR/L    Phylliss Bob 09/11/2019, 4:29 PM

## 2019-09-12 LAB — GLUCOSE, CAPILLARY
Glucose-Capillary: 107 mg/dL — ABNORMAL HIGH (ref 70–99)
Glucose-Capillary: 107 mg/dL — ABNORMAL HIGH (ref 70–99)

## 2019-09-12 NOTE — Progress Notes (Signed)
PROGRESS NOTE    Gary Davis  HCW:[id number] DOB: 05-29-1956 DOA: 08/23/2019 PCP: Medicine, [facility]   Brief Narrative:  64 year old with history of cocaine/alcohol use, depression initially found unresponsive by his roommate brought to the hospital requiring 1 round of CPR.  Had ROSC but subsequently intubated.  Most of the work-up was negative but his echocardiogram showed EF of 30% with moderate to severe hypokinesia seen by cardiology team.  Nuclear scan showed normal perfusion with low risk study per cardiology.  Cleared by cardiology team for discharge.  PT recommended SNF, currently [facility] team working on this.  Slowly working with PT/OT daily.   Assessment & Plan:   Active Problems:   Tobacco use disorder   Arthritis   Osteoarthritis of right knee   Acute respiratory failure (HCC)   Cardiac arrest (HCC)   Acute systolic CHF (congestive heart failure) (HCC)  Cardiac arrest likely secondary to drug use Congestive heart failure with reduced ejection fraction, 30% with global hypokinesia -Nuclear stress test performed by cardiology on 2/20 showed low risk study.  Cleared for discharge at this time.  Should repeat echocardiogram sometime in April 2021  Acute hypoxic/hypercapnic respiratory failure with aspiration pneumonitis, stable 1.4 cm pulmonary nodule -MSSA in the sputum.  Has left 10th rib fracture. -Completed Keflex 3/3 -Would benefit from outpatient pulmonary evaluation and routine follow-up as appropriate.  Repeat outpatient CT in 3 months  Acute metabolic encephalopathy, greatly improved -Suspect from anoxic brain injury during his cardiac arrest. -Patient has capacity to make his decisions in my evaluation.  Appreciate psychiatry input.  Left perinephric hematoma -Appears to be stable per renal ultrasound on 2/25  History of arthritis/cervical fusion -Pain management  Mild dehydration -Improved, lack of IV access as patient continues to  refuse.  Working with speech and swallow team-recommending regular diet with thin liquids  PT/OT evaluation performed.  SNF recommended.  SNF appropriate as the patient has received 3 days of hospital care (or the 3 days stay Has been made by the patient's insurance company) and is felt to need rehab services to restore this patient to their prior level of function to achieve safe transition back to home care.  This patient needs rehab services for at least 5 days/week and skilled nursing services daily to facilitate this transition.  Rehab is been requested as the most appropriate discharge option for this patient and is not felt to be custodial care as evidenced by his previous independence as he lives by himself at his house.  Currently with his improvement in his mentation, he would be a great rehabilitation candidate.  Working with physical therapy-continues to require a lot of assistance and still needs skilled nursing facility.   DVT prophylaxis: Subcutaneous heparin Code Status: Full code Family Communication: Spoke with Edwards. He is updated. Still waiting to hear back form his Landlord so we can obtain his glasses.  Disposition Plan:   Patient From= home  Patient Anticipated D/C place= SNF  Barriers= medically stable, pending placement.  Subjective: Feels ok, no complaints. Still trying to get in touch with his Landlord. Voicemail's left.   Review of Systems Otherwise negative except as per HPI, including: General = no fevers, chills, dizziness,  fatigue HEENT/EYES = negative for loss of vision, double vision, blurred vision,  sore throa Cardiovascular= negative for chest pain, palpitation Respiratory/lungs= negative for shortness of breath, cough, wheezing; hemoptysis,  Gastrointestinal= negative for nausea, vomiting, abdominal pain Genitourinary= negative for Dysuria MSK = Negative for arthralgia,  myalgias Neurology= Negative for headache, numbness, tingling  Psychiatry=  Negative for suicidal and homocidal ideation Skin= Negative for Rash   Examination: Constitutional: Not in acute distress Respiratory: Clear to auscultation bilaterally Cardiovascular: Normal sinus rhythm, no rubs Abdomen: Nontender nondistended good bowel sounds Musculoskeletal: No edema noted Skin: No rashes seen Neurologic: CN 2-12 grossly intact.  And nonfocal Psychiatric: Normal judgment and insight. Alert and oriented x 3. Normal mood.   Objective: Vitals:   09/11/19 1638 09/11/19 2014 09/11/19 2230 09/12/19 0701  BP: (!) 89/66 (!) 89/65 98/64 100/75  Pulse: 69 73  72  Resp: 17 16  16   Temp: 98.4 F (36.9 C) 98.4 F (36.9 C)  98.2 F (36.8 C)  TempSrc: Oral   Oral  SpO2: 99% 100%  100%  Weight:      Height:       No intake or output data in the 24 hours ending 09/12/19 0748 Filed Weights   09/02/19 0500 09/03/19 0500 09/11/19 0544  Weight: 75.7 kg 74.1 kg 71.7 kg     Data Reviewed:   CBC: No results for input(s): WBC, NEUTROABS, HGB, HCT, MCV, PLT in the last 168 hours. Basic Metabolic Panel: No results for input(s): NA, K, CL, CO2, GLUCOSE, BUN, CREATININE, CALCIUM, MG, PHOS in the last 168 hours. GFR: Estimated Creatinine Clearance: 78.6 mL/min (by C-G formula based on SCr of 0.95 mg/dL). Liver Function Tests: No results for input(s): AST, ALT, ALKPHOS, BILITOT, PROT, ALBUMIN in the last 168 hours. No results for input(s): LIPASE, AMYLASE in the last 168 hours. No results for input(s): AMMONIA in the last 168 hours. Coagulation Profile: No results for input(s): INR, PROTIME in the last 168 hours. Cardiac Enzymes: No results for input(s): CKTOTAL, CKMB, CKMBINDEX, TROPONINI in the last 168 hours. BNP (last 3 results) No results for input(s): PROBNP in the last 8760 hours. HbA1C: No results for input(s): HGBA1C in the last 72 hours. CBG: Recent Labs  Lab 09/09/19 0733 09/09/19 2014 09/10/19 0852 09/11/19 0822 09/12/19 0659  GLUCAP 104* 107* 158*  114* 107*   Lipid Profile: No results for input(s): CHOL, HDL, LDLCALC, TRIG, CHOLHDL, LDLDIRECT in the last 72 hours. Thyroid Function Tests: No results for input(s): TSH, T4TOTAL, FREET4, T3FREE, THYROIDAB in the last 72 hours. Anemia Panel: No results for input(s): VITAMINB12, FOLATE, FERRITIN, TIBC, IRON, RETICCTPCT in the last 72 hours. Sepsis Labs: No results for input(s): PROCALCITON, LATICACIDVEN in the last 168 hours.  No results found for this or any previous visit (from the past 240 hour(s)).       Radiology Studies: No results found.      Scheduled Meds: . Chlorhexidine Gluconate Cloth  6 each Topical Daily  . enoxaparin (LOVENOX) injection  40 mg Subcutaneous Q24H  . feeding supplement (NEPRO CARB STEADY)  237 mL Oral Q24H  . pantoprazole  40 mg Oral QHS  . polyethylene glycol  17 g Oral Daily  . potassium chloride  40 mEq Oral Daily  . senna-docusate  1 tablet Per Tube Daily   Continuous Infusions: . sodium chloride Stopped (08/30/19 1659)  . sodium chloride    . sodium chloride    . sodium chloride       LOS: 20 days   Time spent= 25 mins    Radley Teston Arsenio Loader, MD [facility]  If 7PM-7AM, please contact night-coverage  09/12/2019, 7:48 AM

## 2019-09-13 LAB — GLUCOSE, CAPILLARY: Glucose-Capillary: 120 mg/dL — ABNORMAL HIGH (ref 70–99)

## 2019-09-13 MED ORDER — LISINOPRIL 5 MG PO TABS: 5.0000 mg | ORAL_TABLET | Freq: Every day | ORAL | Status: DC

## 2019-09-13 MED ORDER — LISINOPRIL 5 MG PO TABS
2.5000 mg | ORAL_TABLET | Freq: Every day | ORAL | Status: DC
  Administered 2019-09-13 – 2019-09-17 (×5): 2.5 mg via ORAL
  Filled 2019-09-13 (×5): qty 1

## 2019-09-13 NOTE — Progress Notes (Signed)
PROGRESS NOTE    Gary Davis  WFU:[id number] DOB: 01-21-1956 DOA: 08/23/2019 PCP: Medicine, [facility]      Brief Narrative:  Mr. Spainhour is a 64 y.o. M with polysubstance abuse who presented with cardiac arrest.  Had 1 round of CPR and subsequently had ROSC, but required intubation.  Follow-up echocardiogram showed EF 30% with moderate to severe hypokinesis.     Assessment & Plan:  Cardiac arrest New systolic congestive heart failure with reduced ejection fraction -Start low-dose lisinopril -Follow-up with cardiology after discharge  Acute hypoxic and hypercapnic respiratory failure complicated by aspiration pneumonitis MSSA in sputum.  Completed antibiotics.  1.4 cm pulmonary nodule -Repeat outpatient CT in 3 months  Acute metabolic encephalopathy Suspect from anoxic brain injury, resolved.  Left perinephric hematoma Stable         Disposition: The patient was admitted with cardiac arrest.  The patient was independent prior to admission completely.  However as result of his life-threatening illness, he is significantly debilitated, and will need rehab services for at least 5 days a week and skilled nursing services to facilitate his transition back to independent living.          MDM: The below labs and imaging reports were reviewed and summarized above.  Medication management as above.   DVT prophylaxis: Lovenox Code Status: Full code Family Communication:     Consultants:     Procedures:     Antimicrobials:      Culture data:              Subjective: Patient is ready to go home, he is irritated and feel that he is ready for discharge.  No confusion, chest pain, palpitations, leg swelling, syncope.  Objective: Vitals:   09/12/19 1709 09/12/19 2211 09/13/19 1002 09/13/19 1642  BP: 107/75 91/63 104/75 96/71  Pulse: 65 64 71 62  Resp: 16 18  18   Temp: 98.2 F (36.8 C) 97.8 F (36.6 C) 98.4 F (36.9  C) (!) 97.5 F (36.4 C)  TempSrc:  Oral Oral Oral  SpO2: 100% 100% 100% 100%  Weight:      Height:        Intake/Output Summary (Last 24 hours) at 09/13/2019 1739 Last data filed at 09/12/2019 2243 Gross per 24 hour  Intake 240 ml  Output --  Net 240 ml   Filed Weights   09/02/19 0500 09/03/19 0500 09/11/19 0544  Weight: 75.7 kg 74.1 kg 71.7 kg    Examination: General appearance: Thin adult male, alert and in no acute distress.  The bed HEENT: Anicteric, conjunctiva pink, lids and lashes normal. No nasal deformity, discharge, epistaxis.  Lips moist.  Mostly edentulous, oropharynx moist, no oral lesions Skin: Warm and dry.  No jaundice.  No suspicious rashes or lesions. Cardiac: RRR, nl S1-S2, no murmurs appreciated.  Capillary refill is brisk.  JVP normal.  No LE edema.  Radial  pulses 2+ and symmetric. Respiratory: Normal respiratory rate and rhythm.  CTAB without rales or wheezes. Abdomen: Abdomen soft.  No TTP or guarding. No ascites, distension, hepatosplenomegaly.   MSK: No deformities or effusions. Neuro: Awake and alert.  EOMI, moves all extremities. Speech fluent.    Psych: Sensorium intact and responding to questions, attention normal. Affect normal.  Judgment and insight appear normal.    Data Reviewed: I have personally reviewed following labs and imaging studies:  CBC: No results for input(s): WBC, NEUTROABS, HGB, HCT, MCV, PLT in the last 168 hours. Basic Metabolic  Panel: No results for input(s): NA, K, CL, CO2, GLUCOSE, BUN, CREATININE, CALCIUM, MG, PHOS in the last 168 hours. GFR: Estimated Creatinine Clearance: 78.6 mL/min (by C-G formula based on SCr of 0.95 mg/dL). Liver Function Tests: No results for input(s): AST, ALT, ALKPHOS, BILITOT, PROT, ALBUMIN in the last 168 hours. No results for input(s): LIPASE, AMYLASE in the last 168 hours. No results for input(s): AMMONIA in the last 168 hours. Coagulation Profile: No results for input(s): INR, PROTIME in  the last 168 hours. Cardiac Enzymes: No results for input(s): CKTOTAL, CKMB, CKMBINDEX, TROPONINI in the last 168 hours. BNP (last 3 results) No results for input(s): PROBNP in the last 8760 hours. HbA1C: No results for input(s): HGBA1C in the last 72 hours. CBG: Recent Labs  Lab 09/10/19 0852 09/11/19 0822 09/12/19 0659 09/12/19 0823 09/13/19 0750  GLUCAP 158* 114* 107* 107* 120*   Lipid Profile: No results for input(s): CHOL, HDL, LDLCALC, TRIG, CHOLHDL, LDLDIRECT in the last 72 hours. Thyroid Function Tests: No results for input(s): TSH, T4TOTAL, FREET4, T3FREE, THYROIDAB in the last 72 hours. Anemia Panel: No results for input(s): VITAMINB12, FOLATE, FERRITIN, TIBC, IRON, RETICCTPCT in the last 72 hours. Urine analysis:    Component Value Date/Time   COLORURINE YELLOW 08/23/2019 1726   APPEARANCEUR CLEAR 08/23/2019 1726   LABSPEC 1.006 08/23/2019 1726   PHURINE 6.0 08/23/2019 1726   GLUCOSEU NEGATIVE 08/23/2019 1726   HGBUR NEGATIVE 08/23/2019 1726   BILIRUBINUR NEGATIVE 08/23/2019 1726   KETONESUR NEGATIVE 08/23/2019 1726   PROTEINUR NEGATIVE 08/23/2019 1726   UROBILINOGEN 0.2 11/05/2014 0439   NITRITE NEGATIVE 08/23/2019 1726   LEUKOCYTESUR NEGATIVE 08/23/2019 1726   Sepsis Labs: @LABRCNTIP (procalcitonin:4,lacticacidven:4)  )No results found for this or any previous visit (from the past 240 hour(s)).       Radiology Studies: No results found.      Scheduled Meds: . Chlorhexidine Gluconate Cloth  6 each Topical Daily  . enoxaparin (LOVENOX) injection  40 mg Subcutaneous Q24H  . feeding supplement (NEPRO CARB STEADY)  237 mL Oral Q24H  . lisinopril  2.5 mg Oral Daily  . pantoprazole  40 mg Oral QHS  . polyethylene glycol  17 g Oral Daily  . potassium chloride  40 mEq Oral Daily  . senna-docusate  1 tablet Per Tube Daily   Continuous Infusions: . sodium chloride Stopped (08/30/19 1659)  . sodium chloride    . sodium chloride    . sodium chloride        LOS: 21 days    Time spent: 25 minutes    09/01/19, MD [facility] 09/13/2019, 5:39 PM     Please page though AMION or Epic secure chat:  For 09/15/2019, [employer]

## 2019-09-13 NOTE — TOC Progression Note (Signed)
Transition of Care [facility]) - Progression Note    Patient Details  Name: Gary Davis MRN: [id number] Date of Birth: [birth year]  Transition of Care [facility]) CM/SW [place], [place] Phone Number: 09/13/2019, 2:08 PM  Clinical Narrative:    CSW met bedside with patient and discussed SNF vs HH. Patient expressed he does not want to continue waiting for a SNF bed and he would like to go home with [facility]. CSW notified CM on change. TOC team will continue to follow and assist with discharge planning needs.    Expected Discharge Plan: Skilled Nursing Facility Barriers to Discharge: SNF Pending bed offer  Expected Discharge Plan and Services Expected Discharge Plan: [place]                                               Social Determinants of Health (SDOH) Interventions    Readmission Risk Interventions Readmission Risk Prevention Plan 09/01/2019  Transportation Screening Complete  PCP or Specialist Appt within 5-7 Days Complete  Home Care Screening Complete  Medication Review (RN CM) Complete  Some recent data might be hidden

## 2019-09-14 LAB — GLUCOSE, CAPILLARY: Glucose-Capillary: 98 mg/dL (ref 70–99)

## 2019-09-14 NOTE — TOC Transition Note (Addendum)
Transition of Care [facility]) - CM/SW Discharge Note   Patient Details  Name: Gary Davis MRN: [id number] Date of Birth: 1955/09/16  Transition of Care [facility]) CM/SW Contact:  Lawerance Sabal, RN Phone Number: 09/14/2019, 11:01 AM   Clinical Narrative:    Patient states that he has all needed DME from a previous surgery. He states that he is familiar with [facility] services, not currently active, agreeable to any [facility] provider, discussed medicare ratings.  Patient states that he has a ride home, waiting on his keys. No other CM needs identified.   [facility] declined [facility]- declined [facility]- declined [facility]- declined [facility]- declined     Final next level of care: Home w Home Health Services Barriers to Discharge: No Barriers Identified   Patient Goals and CMS Choice Patient states their goals for this hospitalization and ongoing recovery are:: to go home CMS Medicare.gov Compare Post Acute Care list provided to:: Patient Choice offered to / list presented to : Patient  Discharge Placement                       Discharge Plan and Services                          HH Arranged: PT, OT, Nurse's Aide HH Agency: [facility] Date [facility] Agency Contacted: 09/14/19 Time HH Agency Contacted: 1101 Representative spoke with at [facility] Agency: Barbara Cower  Social Determinants of Health (SDOH) Interventions     Readmission Risk Interventions Readmission Risk Prevention Plan 09/01/2019  Transportation Screening Complete  PCP or Specialist Appt within 5-7 Days Complete  Home Care Screening Complete  Medication Review (RN CM) Complete  Some recent data might be hidden

## 2019-09-14 NOTE — Progress Notes (Signed)
PROGRESS NOTE    Gary Davis  NOB:[id number] DOB: 08/31/1955 DOA: 08/23/2019 PCP: Medicine, [facility]      Brief Narrative:  Mr. Swamy is a 64 y.o. M with polysubstance abuse who presented with cardiac arrest after being found unresponsive by his roommate/fiance.  Was last normal on day of admission around 10 or 11 AM and was drinking alcohol and smoking cocaine.  Stopped breathing after fiance found him and she called EMS.  Narcan was administered without improvement, pulses were then lost and CPR was performed and he responded to 1 round without epi.  Intubated and transported to the emergency department.   In the ER, lactate >11, pH <7.35, had AKI and mild troponinemia.  Had no significant neurological activity nor signs of STEMI and so was started on levophed and admitted to ICU.   Follow-up echocardiogram showed EF 30% with moderate to severe hypokinesis.         Assessment & Plan:  Cardiac arrest New systolic congestive heart failure with reduced ejection fraction BP soft, limiting GDMT.  Nuclear stress showed no regional uptake variations.   Appears euvolemic. -Continue lisinopril 2.5 -Hold BB, ARNI, spiro -Follow-up with cardiology after discharge  Acute hypoxic and hypercapnic respiratory failure complicated by aspiration pneumonitis Fever after admission.  MSSA in sputum.  Completed 8 days antibiotics.  Resolved.  1.4 cm pulmonary nodule -Repeat outpatient CT in 3 months -Follow up with Pulmonology at discharge  Acute metabolic encephalopathy Suspect from anoxic brain injury, improving.  Unclear if patient has new cognitive deficits or is back to baseline.  Left perinephric hematoma Stable         Disposition: The patient was admitted after cardiac arrest.  He was independent with all ADLs, lives completely independently prior to admission, but is currently still requiring assistance with basic activities of daily living as  result of his life-threatening illness.  We will continue to provide rehabilitation services, anticipating that he will need services for at least 5 days a week as well as skilled nursing facilities to transition back to independent living.        MDM: The below labs and imaging reports reviewed and summarized above.  Medication management as above.        DVT prophylaxis: Lovenox Code Status: Full code Family Communication:     Consultants:   CCM  Cardiology  Procedures:   Intubated 2/20  Right femoral line 2/20, removed 2/24  Extubated 2/26  Antimicrobials:    Cefepime 2/20 >> 2/22  Cefepime 2/24 x1, Ceftriaxone 2/25, Cefazolin 2/26 >> 2.28  Cephalexin 3/1 >> 3/3  Culture data:    2/25 sputum aspirate -- MSSA          Subjective: No chest pain, dyspnea, palpitation, falls, loss of consciousness, confusion, seizures.     Objective: Vitals:   09/13/19 1002 09/13/19 1642 09/13/19 2225 09/14/19 0500  BP: 104/75 96/71 96/60    Pulse: 71 62 61   Resp:  18 18   Temp: 98.4 F (36.9 C) (!) 97.5 F (36.4 C) 98.1 F (36.7 C)   TempSrc: Oral Oral Oral   SpO2: 100% 100% 100%   Weight:    73.1 kg  Height:        Intake/Output Summary (Last 24 hours) at 09/14/2019 0719 Last data filed at 09/13/2019 1940 Gross per 24 hour  Intake 240 ml  Output --  Net 240 ml   Filed Weights   09/03/19 0500 09/11/19 0544 09/14/19 0500  Weight: 74.1 kg 71.7 kg 73.1 kg    Examination: General appearance: Thin adult male, alert and in no acute distress.   Cardiac: RRR, no murmurs appreciated.  No LE edema.    Respiratory: Normal respiratory rate and rhythm.  CTAB without rales or wheezes. Psych: Sensorium intact and responding to questions, attention normal. Affect normal.  Judgment and insight appear normal.     Data Reviewed: I have personally reviewed following labs and imaging studies:  CBC: No results for input(s): WBC, NEUTROABS, HGB, HCT, MCV, PLT  in the last 168 hours. Basic Metabolic Panel: No results for input(s): NA, K, CL, CO2, GLUCOSE, BUN, CREATININE, CALCIUM, MG, PHOS in the last 168 hours. GFR: Estimated Creatinine Clearance: 80.2 mL/min (by C-G formula based on SCr of 0.95 mg/dL). Liver Function Tests: No results for input(s): AST, ALT, ALKPHOS, BILITOT, PROT, ALBUMIN in the last 168 hours. No results for input(s): LIPASE, AMYLASE in the last 168 hours. No results for input(s): AMMONIA in the last 168 hours. Coagulation Profile: No results for input(s): INR, PROTIME in the last 168 hours. Cardiac Enzymes: No results for input(s): CKTOTAL, CKMB, CKMBINDEX, TROPONINI in the last 168 hours. BNP (last 3 results) No results for input(s): PROBNP in the last 8760 hours. HbA1C: No results for input(s): HGBA1C in the last 72 hours. CBG: Recent Labs  Lab 09/10/19 0852 09/11/19 0822 09/12/19 0659 09/12/19 0823 09/13/19 0750  GLUCAP 158* 114* 107* 107* 120*   Lipid Profile: No results for input(s): CHOL, HDL, LDLCALC, TRIG, CHOLHDL, LDLDIRECT in the last 72 hours. Thyroid Function Tests: No results for input(s): TSH, T4TOTAL, FREET4, T3FREE, THYROIDAB in the last 72 hours. Anemia Panel: No results for input(s): VITAMINB12, FOLATE, FERRITIN, TIBC, IRON, RETICCTPCT in the last 72 hours. Urine analysis:    Component Value Date/Time   COLORURINE YELLOW 08/23/2019 1726   APPEARANCEUR CLEAR 08/23/2019 1726   LABSPEC 1.006 08/23/2019 1726   PHURINE 6.0 08/23/2019 1726   GLUCOSEU NEGATIVE 08/23/2019 1726   HGBUR NEGATIVE 08/23/2019 1726   BILIRUBINUR NEGATIVE 08/23/2019 1726   KETONESUR NEGATIVE 08/23/2019 1726   PROTEINUR NEGATIVE 08/23/2019 1726   UROBILINOGEN 0.2 11/05/2014 0439   NITRITE NEGATIVE 08/23/2019 1726   LEUKOCYTESUR NEGATIVE 08/23/2019 1726   Sepsis Labs: @LABRCNTIP (procalcitonin:4,lacticacidven:4)  )No results found for this or any previous visit (from the past 240 hour(s)).       Radiology  Studies: No results found.      Scheduled Meds: . Chlorhexidine Gluconate Cloth  6 each Topical Daily  . enoxaparin (LOVENOX) injection  40 mg Subcutaneous Q24H  . feeding supplement (NEPRO CARB STEADY)  237 mL Oral Q24H  . lisinopril  2.5 mg Oral Daily  . pantoprazole  40 mg Oral QHS  . polyethylene glycol  17 g Oral Daily  . potassium chloride  40 mEq Oral Daily  . senna-docusate  1 tablet Per Tube Daily   Continuous Infusions: . sodium chloride Stopped (08/30/19 1659)  . sodium chloride    . sodium chloride    . sodium chloride       LOS: 22 days    Time spent: 15 minutes    Edwin Dada, MD [facility] 09/14/2019, 7:19 AM     Please page though [place] or Epic secure chat:  For [employer], Adult nurse

## 2019-09-15 LAB — GLUCOSE, CAPILLARY: Glucose-Capillary: 91 mg/dL (ref 70–99)

## 2019-09-15 NOTE — Progress Notes (Signed)
Physical Therapy Treatment Patient Details Name: Gary Davis MRN: [id number] DOB: [birth year] Today's Date: 09/15/2019    History of Present Illness 64 y.o. male admitted on 08/23/19 after being found down in his home.  Pt dx with acute hypoxic respiratory failure/hypercapnic (likely from accidental OD from cocaine and alcohol) intubated 2/20- 2/26/21complicated by aspiration PNA, breif cardiac arrest, new onset A-fib, AKI, shock, acute metabolic encepholopathy, new systolic CHF.  Pt with significant PMH of chronic back pain, neck pain/surgery, R TKA, ORIF R ankle.     PT Comments    Pt was able to get up and walk a short distance down the hallway with min guard assist for safety.  He reports head and neck pain and is hopeful to d/c home.  He may be close to his baseline level of mobility given his spine surgery history, but will still benefit from some post acute therapy for balance, strength, and gait training.  He cannot remember if he definitely has a walker at home, so I am recommending he get a RW for d/c as this will decrease his fall risk.  PT will continue to follow acutely for safe mobility progression.  Follow Up Recommendations  Home health PT     Equipment Recommendations  Rolling walker with 5" wheels    Recommendations for Other Services   NA     Precautions / Restrictions Precautions Precautions: Fall    Mobility  Bed Mobility Overal bed mobility: Modified Independent                Transfers Overall transfer level: Needs assistance Equipment used: Rolling walker (2 wheeled) Transfers: Sit to/from Stand Sit to Stand: Supervision         General transfer comment: supervision for safety  Ambulation/Gait Ambulation/Gait assistance: Min guard Gait Distance (Feet): 65 Feet Assistive device: Rolling walker (2 wheeled) Gait Pattern/deviations: Step-through pattern;Shuffle Gait velocity: decreased Gait velocity interpretation: <1.31 ft/sec,  indicative of household ambulator General Gait Details: Pt tends to drag his feet, staggering pattern, at times kicks the side of the walker when turning, limited gait distance/endurance.           Balance Overall balance assessment: Needs assistance Sitting-balance support: Feet supported;No upper extremity supported Sitting balance-Leahy Scale: Good     Standing balance support: Bilateral upper extremity supported Standing balance-Leahy Scale: Fair Standing balance comment: static standing with supervision, does prefer to have UE support, needs min guard assist and a device for anything dynamic.                             Cognition Arousal/Alertness: Awake/alert Behavior During Therapy: Impulsive Overall Cognitive Status: No family/caregiver present to determine baseline cognitive functioning                                 General Comments: Pt's current cognition may be baseline             Pertinent Vitals/Pain Pain Assessment: Faces Faces Pain Scale: Hurts even more Pain Location: posterior neck and head Pain Descriptors / Indicators: Grimacing;Headache Pain Intervention(s): Limited activity within patient's tolerance;Monitored during session;Repositioned;Patient requesting pain meds-RN notified;Heat applied           PT Goals (current goals can now be found in the care plan section) Acute Rehab PT Goals Patient Stated Goal: to go home PT Goal Formulation: With patient Time For Goal  Achievement: 09/29/19 Potential to Achieve Goals: Good Progress towards PT goals: Progressing toward goals    Frequency    Min 3X/week      PT Plan Discharge plan needs to be updated       AM-PAC PT "6 Clicks" Mobility   Outcome Measure  Help needed turning from your back to your side while in a flat bed without using bedrails?: A Little Help needed moving from lying on your back to sitting on the side of a flat bed without using bedrails?: A  Little Help needed moving to and from a bed to a chair (including a wheelchair)?: A Little Help needed standing up from a chair using your arms (e.g., wheelchair or bedside chair)?: A Little Help needed to walk in hospital room?: A Little Help needed climbing 3-5 steps with a railing? : A Little 6 Click Score: 18    End of Session Equipment Utilized During Treatment: Gait belt Activity Tolerance: Patient limited by fatigue;Patient limited by pain Patient left: in bed;with call bell/phone within reach;with bed alarm set Nurse Communication: Patient requests pain meds PT Visit Diagnosis: Muscle weakness (generalized) (M62.81);Difficulty in walking, not elsewhere classified (R26.2);Unsteadiness on feet (R26.81)     Time: 4010-2725 PT Time Calculation (min) (ACUTE ONLY): 28 min  Charges:  $Gait Training: 8-22 mins $Therapeutic Activity: 8-22 mins                    Verdene Lennert, PT, DPT  Acute Rehabilitation [phone] pager #[phone] office     09/15/2019, 4:13 PM

## 2019-09-15 NOTE — Progress Notes (Signed)
Nutrition Follow-up  DOCUMENTATION CODES:   Not applicable  INTERVENTION:   Continue Magic cup TID with meals, each supplement provides 290 kcal and 9 grams of protein   Continue Nepro Shake po once daily, each supplement provides 425 kcal and 19 grams protein  NUTRITION DIAGNOSIS:   Inadequate oral intake related to acute illness, inability to eat as evidenced by NPO status.  Resolved   GOAL:   Patient will meet greater than or equal to 90% of their needs  Met  MONITOR:   Vent status, TF tolerance, Weight trends, Labs  REASON FOR ASSESSMENT:   Consult, Ventilator Enteral/tube feeding initiation and management  ASSESSMENT:   64 yo male admitted after being found unresponsive and admitted post cardiac arrest with subsequent respiratory failure and cardiogenic shock, nonoliguric AKI. PMH includes cocaine and EtOH abuse, depression  Plans for d/c home with home health vs SNF placement.  Meal intakes 80-90%. He is being offered Nepro Shake supplement once daily, however, he only accepts it sometimes. He is receiving magic cups with meals.  Labs and medications reviewed. CBG: 91 this morning  Diet Order:   Diet Order            Diet - low sodium heart healthy        Diet regular Room service appropriate? Yes; Fluid consistency: Thin  Diet effective now              EDUCATION NEEDS:   Not appropriate for education at this time  Skin:  Skin Assessment: Reviewed RN Assessment  Last BM:  3/14  Height:   Ht Readings from Last 1 Encounters:  08/23/19 6' (1.829 m)    Weight:   Wt Readings from Last 1 Encounters:  09/15/19 71.4 kg    BMI:  Body mass index is 21.35 kg/m.  Estimated Nutritional Needs:   Kcal:  1900-2100  Protein:  95-105  Fluid:  >/= 1.9 L/day    Molli Barrows, RD, LDN, CNSC Please refer to Amion for contact information.

## 2019-09-15 NOTE — Progress Notes (Signed)
PROGRESS NOTE    Gary Davis  UVO:[id number] DOB: 1955-09-25 DOA: 08/23/2019 PCP: Medicine, [facility]      Brief Narrative:  Gary Davis is a 64 y.o. M with polysubstance abuse who presented with cardiac arrest after being found unresponsive by his roommate/fiance.  Was last normal on day of admission around 10 or 11 AM and was drinking alcohol and smoking cocaine.  Stopped breathing after fiance found him and she called EMS.  Narcan was administered without improvement, pulses were then lost and CPR was performed and he responded to 1 round without epi.  Intubated and transported to the emergency department.   In the ER, lactate >11, pH <7.35, had AKI and mild troponinemia.  Had no significant neurological activity nor signs of STEMI and so was started on levophed and admitted to ICU.   Follow-up echocardiogram showed EF 30% with moderate to severe hypokinesis.         Assessment & Plan:  Cardiac arrest New systolic congestive heart failure with reduced ejection fraction BP soft, limiting GDMT.  Nuclear stress showed no regional uptake variations.   Appears euvolemic, blood pressure soft -Continue lisinopril 2.5 -Hold BB, ARNI, spiro -Follow-up with cardiology after discharge  Acute hypoxic and hypercapnic respiratory failure complicated by aspiration pneumonitis Fever after admission.  MSSA in sputum.  Completed 8 days antibiotics.  Resolved.  1.4 cm pulmonary nodule -Repeat outpatient CT in 3 months -Follow up with Pulmonology at discharge  Acute metabolic encephalopathy The patient had some initial severe embolic encephalopathy, from anoxic brain injury, this is now improved.  Unclear if patient has new cognitive deficits or is back to baseline.  The patient's interactions demonstrate logical, ordered thinking, and after discussion of potential benefits and harms of treatment, I believe he has a usual level of understanding of the proposed  treatment plan and alternatives, appreciates the reasonably forseeable consequences of treatment plan and alternatives, and possesses medical decision making capacity.     Left perinephric hematoma Stable         Disposition: The patient was admitted after cardiac arrest.  He was independent with all ADLs, lives completely independently prior to admission, but is currently still requiring assistance with basic activities of daily living as result of his life-threatening illness.  We will continue to provide rehabilitation services, anticipating that he will need services for at least 5 days a week as well as skilled nursing facilities to transition back to independent living.          MDM: The below labs and imaging reports reviewed and summarized above.  Medication management as above.        DVT prophylaxis: Lovenox Code Status: Full code Family Communication:     Consultants:   CCM  Cardiology  Procedures:   Intubated 2/20  Right femoral line 2/20, removed 2/24  Extubated 2/26  Antimicrobials:    Cefepime 2/20 >> 2/22  Cefepime 2/24 x1, Ceftriaxone 2/25, Cefazolin 2/26 >> 2.28  Cephalexin 3/1 >> 3/3  Culture data:    2/25 sputum aspirate -- MSSA          Subjective: No chest pain, dyspnea, palpitation, falls, loss of consciousness, confusion, seizures.     Objective: Vitals:   09/15/19 0300 09/15/19 0850 09/15/19 0855 09/15/19 1512  BP:  95/60 91/78 95/68   Pulse:      Resp:  16  18  Temp:    98.6 F (37 C)  TempSrc:    Oral  SpO2:  100%  97%  Weight: 71.4 kg     Height:        Intake/Output Summary (Last 24 hours) at 09/15/2019 1716 Last data filed at 09/15/2019 0935 Gross per 24 hour  Intake 360 ml  Output --  Net 360 ml   Filed Weights   09/11/19 0544 09/14/19 0500 09/15/19 0300  Weight: 71.7 kg 73.1 kg 71.4 kg    Examination: General appearance: Thin adult male, lying bed, no acute distress Cardiac: Rate and  rhythm, no murmurs appreciated, no lower extremity edema Respiratory: Normal rate and rhythm, lungs clear without rales or wheezes. Psych: Sensorium intact and responding to questions, attention normal, affect normal, judgment and insight appear normal.  Gait slow but steady with walker.     Data Reviewed: I have personally reviewed following labs and imaging studies:  CBC: No results for input(s): WBC, NEUTROABS, HGB, HCT, MCV, PLT in the last 168 hours. Basic Metabolic Panel: No results for input(s): NA, K, CL, CO2, GLUCOSE, BUN, CREATININE, CALCIUM, MG, PHOS in the last 168 hours. GFR: Estimated Creatinine Clearance: 78.3 mL/min (by C-G formula based on SCr of 0.95 mg/dL). Liver Function Tests: No results for input(s): AST, ALT, ALKPHOS, BILITOT, PROT, ALBUMIN in the last 168 hours. No results for input(s): LIPASE, AMYLASE in the last 168 hours. No results for input(s): AMMONIA in the last 168 hours. Coagulation Profile: No results for input(s): INR, PROTIME in the last 168 hours. Cardiac Enzymes: No results for input(s): CKTOTAL, CKMB, CKMBINDEX, TROPONINI in the last 168 hours. BNP (last 3 results) No results for input(s): PROBNP in the last 8760 hours. HbA1C: No results for input(s): HGBA1C in the last 72 hours. CBG: Recent Labs  Lab 09/12/19 0659 09/12/19 0823 09/13/19 0750 09/14/19 0753 09/15/19 0809  GLUCAP 107* 107* 120* 98 91   Lipid Profile: No results for input(s): CHOL, HDL, LDLCALC, TRIG, CHOLHDL, LDLDIRECT in the last 72 hours. Thyroid Function Tests: No results for input(s): TSH, T4TOTAL, FREET4, T3FREE, THYROIDAB in the last 72 hours. Anemia Panel: No results for input(s): VITAMINB12, FOLATE, FERRITIN, TIBC, IRON, RETICCTPCT in the last 72 hours. Urine analysis:    Component Value Date/Time   COLORURINE YELLOW 08/23/2019 1726   APPEARANCEUR CLEAR 08/23/2019 1726   LABSPEC 1.006 08/23/2019 1726   PHURINE 6.0 08/23/2019 1726   GLUCOSEU NEGATIVE  08/23/2019 1726   HGBUR NEGATIVE 08/23/2019 1726   BILIRUBINUR NEGATIVE 08/23/2019 1726   KETONESUR NEGATIVE 08/23/2019 1726   PROTEINUR NEGATIVE 08/23/2019 1726   UROBILINOGEN 0.2 11/05/2014 0439   NITRITE NEGATIVE 08/23/2019 1726   LEUKOCYTESUR NEGATIVE 08/23/2019 1726   Sepsis Labs: @LABRCNTIP (procalcitonin:4,lacticacidven:4)  )No results found for this or any previous visit (from the past 240 hour(s)).       Radiology Studies: No results found.      Scheduled Meds: . Chlorhexidine Gluconate Cloth  6 each Topical Daily  . enoxaparin (LOVENOX) injection  40 mg Subcutaneous Q24H  . feeding supplement (NEPRO CARB STEADY)  237 mL Oral Q24H  . lisinopril  2.5 mg Oral Daily  . pantoprazole  40 mg Oral QHS  . polyethylene glycol  17 g Oral Daily  . potassium chloride  40 mEq Oral Daily  . senna-docusate  1 tablet Per Tube Daily   Continuous Infusions: . sodium chloride Stopped (08/30/19 1659)  . sodium chloride    . sodium chloride    . sodium chloride       LOS: 23 days    Time spent: 15 minutes  Alberteen Sam, MD [facility] 09/15/2019, 5:16 PM     Please page though AMION or Epic secure chat:  For [employer], Higher education careers adviser

## 2019-09-15 NOTE — Clinical Social Work Note (Signed)
CSW has been unable to find a home health agency to accept patient. CSW was advised to talk with patient/brother about outpatient therapy.   CSW spoke with patient's brother Gary Davis and he states that he is unable to commit to transporting the patient to and from outpatient therapy as he works.   Gary Davis also informed CSW that patient's previous roommate expired, therefore the patient would be returning home alone.   Patient will need some type of therapy services to ensure he is able to ambulate safely and get back to his prior level of functioning.   CSW will continue to work with [facility] leadership.

## 2019-09-16 LAB — GLUCOSE, CAPILLARY
Glucose-Capillary: 116 mg/dL — ABNORMAL HIGH (ref 70–99)
Glucose-Capillary: 129 mg/dL — ABNORMAL HIGH (ref 70–99)

## 2019-09-16 NOTE — Progress Notes (Signed)
Occupational Therapy Treatment Patient Details Name: Gary Davis MRN: [id number] DOB: 04-Jan-1956 Today's Date: 09/16/2019    History of present illness 64 y.o. male admitted on 08/23/19 after being found down in his home.  Pt dx with acute hypoxic respiratory failure/hypercapnic (likely from accidental OD from cocaine and alcohol) intubated 2/20- 2/26/21complicated by aspiration PNA, breif cardiac arrest, new onset A-fib, AKI, shock, acute metabolic encepholopathy, new systolic CHF.  Pt with significant PMH of chronic back pain, neck pain/surgery, R TKA, ORIF R ankle.    OT comments  Patient in shower on arrival.  He was able to complete shower with modified independence and and shower transfer with supervision.  He did complain of being "wiped out" after shower.  Patient moved well throughout room with supervision.  Patient would benefit from supervision and HHOT to increase safety at home.  Patient appears to be close to baseline function.  Will continue to follow with OT acutely to address the deficits listed below.    Follow Up Recommendations  Home health OT;Supervision - Intermittent    Equipment Recommendations  None recommended by OT    Recommendations for Other Services      Precautions / Restrictions Precautions Precautions: Fall Restrictions Weight Bearing Restrictions: No       Mobility Bed Mobility Overal bed mobility: Modified Independent Bed Mobility: Sit to Supine       Sit to supine: Modified independent (Device/Increase time)      Transfers Overall transfer level: Needs assistance Equipment used: None   Sit to Stand: Supervision Stand pivot transfers: Supervision            Balance Overall balance assessment: Needs assistance Sitting-balance support: Feet supported;No upper extremity supported Sitting balance-Leahy Scale: Good Sitting balance - Comments: able to bend over in chair and put on socks   Standing balance support: During  functional activity Standing balance-Leahy Scale: Fair                             ADL either performed or assessed with clinical judgement   ADL Overall ADL's : Needs assistance/impaired         Upper Body Bathing: Modified independent;Sitting;Standing   Lower Body Bathing: Modified independent;Sitting/lateral leans;Sit to/from stand       Lower Body Dressing: Set up;Sit to/from stand           Tub/ Shower Transfer: Supervision/safety;Ambulation   Functional mobility during ADLs: Supervision/safety General ADL Comments: Patient walked around room with no AD- would be better with walker     Vision       Perception     Praxis      Cognition Arousal/Alertness: Awake/alert Behavior During Therapy: Impulsive Overall Cognitive Status: No family/caregiver present to determine baseline cognitive functioning Area of Impairment: Safety/judgement;Problem solving                         Safety/Judgement: Decreased awareness of safety;Decreased awareness of deficits   Problem Solving: Slow processing;Difficulty sequencing General Comments: Pt's current cognition may be baseline        Exercises     Shoulder Instructions       General Comments      Pertinent Vitals/ Pain       Pain Assessment: No/denies pain  Home Living  Prior Functioning/Environment              Frequency  Min 2X/week        Progress Toward Goals  OT Goals(current goals can now be found in the care plan section)  Progress towards OT goals: Progressing toward goals  Acute Rehab OT Goals Patient Stated Goal: to go home OT Goal Formulation: With patient Time For Goal Achievement: 09/15/19 Potential to Achieve Goals: Good  Plan Discharge plan needs to be updated    Co-evaluation                 AM-PAC OT "6 Clicks" Daily Activity     Outcome Measure   Help from another person eating  meals?: None Help from another person taking care of personal grooming?: A Little Help from another person toileting, which includes using toliet, bedpan, or urinal?: A Little Help from another person bathing (including washing, rinsing, drying)?: A Little Help from another person to put on and taking off regular upper body clothing?: A Little Help from another person to put on and taking off regular lower body clothing?: A Little 6 Click Score: 19    End of Session    OT Visit Diagnosis: Unsteadiness on feet (R26.81);Muscle weakness (generalized) (M62.81)   Activity Tolerance Patient tolerated treatment well   Patient Left in bed;with call bell/phone within reach   Nurse Communication Mobility status        Time: 1151-1202 OT Time Calculation (min): 11 min  Charges: OT General Charges $OT Visit: 1 Visit OT Treatments $Self Care/Home Management : 8-22 mins  August Luz, OTR/L    Phylliss Bob 09/16/2019, 2:12 PM

## 2019-09-16 NOTE — Progress Notes (Signed)
PROGRESS NOTE    Gary Davis  ZLD:[id number] DOB: [birth year] DOA: 08/23/2019 PCP: Medicine, [facility]      Brief Narrative:  Gary Davis is a 64 y.o. M with polysubstance abuse who presented with cardiac arrest after being found unresponsive by his roommate/fiance.  Was last normal on day of admission around 10 or 11 AM and was drinking alcohol and smoking cocaine.  Stopped breathing after fiance found him and she called EMS.  Narcan was administered without improvement, pulses were then lost and CPR was performed and he responded to 1 round without epi.  Intubated and transported to the emergency department.   In the ER, lactate >11, pH <7.35, had AKI and mild troponinemia.  Had no significant neurological activity nor signs of STEMI and so was started on levophed and admitted to ICU.   Follow-up echocardiogram showed EF 30% with moderate to severe hypokinesis.         Assessment & Plan:  Cardiac arrest New systolic congestive heart failure with reduced ejection fraction Patient intubated in the ER, remained intubated 6 days.  Echocardiogram showed EF 30%, new reduced EF.  Cardiology were consulted, nuclear stress performed 2/20 was thought to be a low risk study (low EF, but no regional uptake variations).  Cardiology recommended GDMT, which has been limited by low blood pressure, and outpatient follow-up.  Euvolemic, blood pressure remains soft.  -Continue lisinopril 2.5 mg daily -Hold BB, ARN I, spironolactone -Follow-up with cardiology Dr. Ladona Ridgel after discharge   Acute hypoxic and hypercapnic respiratory failure complicated by aspiration pneumonitis Patient developed fever after admission.  MSSA in sputum.  Completed 8 days antibiotics.  Resolved.  1.4 cm pulmonary nodule This was an incidental finding -Repeat outpatient CT in 3 months -Follow up with Pulmonology at discharge  Acute metabolic encephalopathy The patient had some initial  severe embolic encephalopathy, from anoxic brain injury, this is now improved.  Unclear if patient has new cognitive deficits or is back to baseline.  The patient's interactions demonstrate logical, ordered thinking, and after discussion of potential benefits and harms of treatment, I believe he has a usual level of understanding of the proposed treatment plan and alternatives, appreciates the reasonably forseeable consequences of treatment plan and alternatives, and possesses medical decision making capacity.  He was also evaluated by psychiatry, who agreed with same  Left perinephric hematoma Stable         Disposition: The patient was admitted after cardiac arrest.    Prior to admission he was independent with all ADLs, lives completely independently.    Although he is approaching some baseline after rehabilitating here for almost 3 weeks, he is still unstable, weak, requires supervision for shower transfers, and has significantly modified independence.    We have been unable to arrange SNF or HH PT, and his only option for rehabilitation will be to go home by himself, and go to outpatient physical therapy.  This has been arranged, and the last pending detail is transportation.  At present, transportation is nearly arranged, and I feel it is unethical to discharge this patient to home in his current state without due diligence to provide him with reasonable and needed therapy appointments.  Anticipate TOC to arrange transportation to PT tomorrow morning, and discharge after.        MDM: The below labs and imaging reports reviewed and summarized above.  Medication management as above.       DVT prophylaxis: Lovenox Code Status: Full code Family  Communication: Brother by phone    Consultants:   CCM  Cardiology  Procedures:   Intubated 2/20  Right femoral line 2/20, removed 2/24  Extubated 2/26  Antimicrobials:    Cefepime 2/20 >> 2/22  Cefepime 2/24 x1,  Ceftriaxone 2/25, Cefazolin 2/26 >> 2.28  Cephalexin 3/1 >> 3/3  Culture data:    2/25 sputum aspirate -- MSSA          Subjective: No chest pain, dyspnea, palpitations, falls, loss of consciousness, confusion, seizures.  He is generally weak.  He is discoordinated.   Objective: Vitals:   09/15/19 0855 09/15/19 1512 09/15/19 2312 09/16/19 0300  BP: 91/78 95/68 102/64   Pulse:   (!) 58   Resp:  18 16   Temp:  98.6 F (37 C) 97.6 F (36.4 C)   TempSrc:  Oral    SpO2:  97% 100%   Weight:    71 kg  Height:       No intake or output data in the 24 hours ending 09/16/19 1721 Filed Weights   09/14/19 0500 09/15/19 0300 09/16/19 0300  Weight: 73.1 kg 71.4 kg 71 kg    Examination: General appearance: Thin adult male, sitting in the edge of the bed, no acute distress. Cardiac: Regular rate and rhythm, no murmurs, no lower extremity edema. Respiratory: Normal respiratory rate and rhythm, lungs clear without rales wheezes. Neuro/psych: Moves all extremities with normal strength and coordination, speech fluent, sensorium intact and responding to questions, attention normal, affect normal, judgment insight appear close to normal.      Data Reviewed: I have personally reviewed following labs and imaging studies:  CBC: No results for input(s): WBC, NEUTROABS, HGB, HCT, MCV, PLT in the last 168 hours. Basic Metabolic Panel: No results for input(s): NA, K, CL, CO2, GLUCOSE, BUN, CREATININE, CALCIUM, MG, PHOS in the last 168 hours. GFR: Estimated Creatinine Clearance: 77.9 mL/min (by C-G formula based on SCr of 0.95 mg/dL). Liver Function Tests: No results for input(s): AST, ALT, ALKPHOS, BILITOT, PROT, ALBUMIN in the last 168 hours. No results for input(s): LIPASE, AMYLASE in the last 168 hours. No results for input(s): AMMONIA in the last 168 hours. Coagulation Profile: No results for input(s): INR, PROTIME in the last 168 hours. Cardiac Enzymes: No results for input(s):  CKTOTAL, CKMB, CKMBINDEX, TROPONINI in the last 168 hours. BNP (last 3 results) No results for input(s): PROBNP in the last 8760 hours. HbA1C: No results for input(s): HGBA1C in the last 72 hours. CBG: Recent Labs  Lab 09/13/19 0750 09/14/19 0753 09/15/19 0809 09/16/19 0502 09/16/19 0751  GLUCAP 120* 98 91 116* 129*   Lipid Profile: No results for input(s): CHOL, HDL, LDLCALC, TRIG, CHOLHDL, LDLDIRECT in the last 72 hours. Thyroid Function Tests: No results for input(s): TSH, T4TOTAL, FREET4, T3FREE, THYROIDAB in the last 72 hours. Anemia Panel: No results for input(s): VITAMINB12, FOLATE, FERRITIN, TIBC, IRON, RETICCTPCT in the last 72 hours. Urine analysis:    Component Value Date/Time   COLORURINE YELLOW 08/23/2019 1726   APPEARANCEUR CLEAR 08/23/2019 1726   LABSPEC 1.006 08/23/2019 1726   PHURINE 6.0 08/23/2019 1726   GLUCOSEU NEGATIVE 08/23/2019 1726   HGBUR NEGATIVE 08/23/2019 1726   BILIRUBINUR NEGATIVE 08/23/2019 1726   KETONESUR NEGATIVE 08/23/2019 1726   PROTEINUR NEGATIVE 08/23/2019 1726   UROBILINOGEN 0.2 11/05/2014 0439   NITRITE NEGATIVE 08/23/2019 1726   LEUKOCYTESUR NEGATIVE 08/23/2019 1726   Sepsis Labs: @LABRCNTIP (procalcitonin:4,lacticacidven:4)  )No results found for this or any previous visit (from the  past 240 hour(s)).       Radiology Studies: No results found.      Scheduled Meds: . Chlorhexidine Gluconate Cloth  6 each Topical Daily  . enoxaparin (LOVENOX) injection  40 mg Subcutaneous Q24H  . feeding supplement (NEPRO CARB STEADY)  237 mL Oral Q24H  . lisinopril  2.5 mg Oral Daily  . pantoprazole  40 mg Oral QHS  . polyethylene glycol  17 g Oral Daily  . potassium chloride  40 mEq Oral Daily  . senna-docusate  1 tablet Per Tube Daily   Continuous Infusions: . sodium chloride Stopped (08/30/19 1659)  . sodium chloride    . sodium chloride    . sodium chloride       LOS: 24 days    Time spent: 15  minutes    Alberteen Sam, MD [facility] 09/16/2019, 5:21 PM     Please page though AMION or Epic secure chat:  For [employer], Higher education careers adviser

## 2019-09-16 NOTE — Clinical Social Work Note (Addendum)
Update 3:40pm  CSW spoke with Revonda Standard with [employer] [phone] to inquire about transportation options.  She states that they are able to accommodate pre-scheduled transportation appointments. Patient would need to be set up prior to discharge. TOC would be billed for the dc transport. However, after this anything set up will be billed to the ending location.   Ie; destination-outpatient rehab. Billed-outpatient rehab  Ie; dc from [facility] to patient's home. TOC dept would be billed  The transportation service would need to know the patient's level of care when arranging transport. This will help the company to know what kind of transport to set up. CSW will relay this information to MD.    CSW working with [facility] leadership and MD to provide safe discharge plan. Patient will be going home alone with no access to outpatient therapy.   It has been noted that the patient will be benefit greatly from getting home health, however TOC team has been unable to find an accepting agency.   Dispo remains complicated.

## 2019-09-16 NOTE — Consult Note (Signed)
[facility] Consult   Reason for Consult: Capacity Referring Physician: [place] Patient Identification: Gary Davis MRN:  [id number] Principal Diagnosis: <principal problem not specified> Diagnosis:  Active Problems:   Tobacco use disorder   Arthritis   Osteoarthritis of right knee   Acute respiratory failure ([place])   Cardiac arrest ([place])   Acute systolic CHF (congestive heart failure) ([place])   Total Time spent with patient: 30 minutes  Subjective:   Gary Davis is a 63 y.o. male patient admitted with being found unresponsive on 08/23/2019.  He was noted to have pinpoint pupils, and has a reported history of alcohol as well as cocaine dependence.  HPI: Patient is seen and examined.  Patient is a 64 year old male with the above-stated past medical and psychiatric history in which consultation was requested the psychiatric service to evaluate for capacity.  Review of the electronic medical record revealed that the patient had been admitted on 08/23/2019.  He presented with altered mental status and having been found down in the community.  It was found he had cardiac arrest and most probably a hypoxic brain injury.  On admission he had what was also noted to be acute kidney injury as well as a mild troponin anemia.  He had no significant neurological activity or signs of ST segment elevated myocardial infarction, and was admitted to the intensive care unit at that time.  During the course the hospitalization he also underwent an echocardiogram that revealed an ejection fraction of 30% with moderate to severe hypokinesis.  Social work stated on 09/15/2019 that they had been unable to find a home health agency to accept the patient.  Social work was advised to talk to the patient and her brother about outpatient treatment.  The brother stated that he is unable to commit to transporting the patient to and from outpatient therapy secondary to his work.  The patient currently  lives alone.  During the course of the hospitalization social work also met with the patient and discussed skilled nursing facility versus home health.  The patient stated on 09/13/2019 that he did not want to continue to wait for skilled nursing facility bed, and he would like to go home with home health.  There was concern of the primary team with the ability of the patient to be able to make this type of rational decision.  You of the electronic medical record revealed no previous psychiatric admissions.  There was no additional information in the chart from care everywhere for other facilities.  The patient stated today that he had previous spinal surgery, and that he does not want to go to some other facility.  He wants to go home.  He said he knows how to do the bands to strengthen his legs and arms.  He said he has done that before.  He stated he would like to see the light of day since he has been in the hospital for as long as he has.  He admitted to alcohol issues in the past, and stated that he had been sober for "a couple of months".  He denied auditory or visual hallucinations.  He denied suicidal or homicidal ideation.  On examination he is slightly cognitively impaired.  He did know he was at [facility], he thought the year was 2022, but did know it was March.  He was able to remember Biden, but was unable to remember any other presidents.  His retention of information is mildly impaired.  It should be noted that the patient was seen on 09/06/2019 by the consult service, and the patient was cleared psychiatrically at that time.  It was felt the patient did have capacity to make decisions.  Past Psychiatric History: History of substance related issues.  Risk to Self:   Risk to Others:   Prior Inpatient Therapy:   Prior Outpatient Therapy:    Past Medical History:  Past Medical History:  Diagnosis Date  . Acute respiratory failure ([place]) 08/2019  . Alcohol abuse   . Arthritis     "both knees; joints in hands; back of my neck; lower back" (11/04/2015)  . Cataract   . Chronic back pain    "upper and lower" (11/04/2015)  . Depression   . Fractures 04/2015   neck fracture  . GERD (gastroesophageal reflux disease)     Past Surgical History:  Procedure Laterality Date  . ARTHROSCOPIC REPAIR ACL Right   . BACK SURGERY    . FINGER FRACTURE SURGERY Right    4th Finger  . FRACTURE SURGERY    . JOINT REPLACEMENT    . MANDIBLE FRACTURE SURGERY  1990s  . ORBITAL FRACTURE SURGERY Right ~ 1970  . ORIF ANKLE FRACTURE Right   . POSTERIOR CERVICAL FUSION/FORAMINOTOMY N/A 02/09/2014   Procedure: POSTERIOR CERVICAL FUSION/FORAMINOTOMY LEVEL 4  Cervical three to seven decompression and fusion with lateral mass screws;  Surgeon: Elaina Hoops, MD;  Location: [facility];  Service: Neurosurgery;  Laterality: N/A;  . POSTERIOR CERVICAL FUSION/FORAMINOTOMY N/A 04/15/2015   Procedure: Posterior cervical fusion C6-T1 with instrumentation and arthrodesis, decompressive laminectomy C7-T1, Foraminotomy C8-T1;  Surgeon: Kary Kos, MD;  Location: [facility];  Service: Neurosurgery;  Laterality: N/A;  . TONSILLECTOMY    . TOTAL KNEE ARTHROPLASTY Right 11/04/2015  . TOTAL KNEE ARTHROPLASTY Right 11/04/2015   Procedure: RIGHT TOTAL KNEE ARTHROPLASTY;  Surgeon: Leandrew Koyanagi, MD;  Location: [facility];  Service: Orthopedics;  Laterality: Right;   Family History:  Family History  Problem Relation Age of Onset  . Cancer Mother        breast cancer   Family Psychiatric  History: Noncontributory Social History:  Social History   Substance and Sexual Activity  Alcohol Use Yes  . Alcohol/week: 6.0 standard drinks  . Types: 6 Cans of beer per week     Social History   Substance and Sexual Activity  Drug Use No    Social History   Socioeconomic History  . Marital status: Divorced    Spouse name: Not on file  . Number of children: Not on file  . Years of education: Not on file  . Highest  education level: Not on file  Occupational History  . Not on file  Tobacco Use  . Smoking status: Current Some Day Smoker    Packs/day: 0.12    Years: 30.00    Pack years: 3.60    Types: Cigarettes  . Smokeless tobacco: Never Used  Substance and Sexual Activity  . Alcohol use: Yes    Alcohol/week: 6.0 standard drinks    Types: 6 Cans of beer per week  . Drug use: No  . Sexual activity: Not Currently  Other Topics Concern  . Not on file  Social History Narrative  . Not on file   Social Determinants of Health   Financial Resource Strain:   . Difficulty of Paying Living Expenses:   Food Insecurity:   . Worried About Charity fundraiser in the Last Year:   .  Ran Out of Food in the Last Year:   Transportation Needs:   . Film/video editor (Medical):   Marland Kitchen Lack of Transportation (Non-Medical):   Physical Activity:   . Days of Exercise per Week:   . Minutes of Exercise per Session:   Stress:   . Feeling of Stress :   Social Connections:   . Frequency of Communication with Friends and Family:   . Frequency of Social Gatherings with Friends and Family:   . Attends Religious Services:   . Active Member of Clubs or Organizations:   . Attends Archivist Meetings:   Marland Kitchen Marital Status:    Additional Social History:    Allergies:   Allergies  Allergen Reactions  . Hydrocodone Itching and Nausea And Vomiting  . Percocet [Oxycodone-Acetaminophen] Nausea And Vomiting    Can take oxycodone without tylenol additive  . Tramadol Nausea Only    Labs:  Results for orders placed or performed during the hospital encounter of 08/23/19 (from the past 48 hour(s))  Glucose, capillary     Status: None   Collection Time: 09/15/19  8:09 AM  Result Value Ref Range   Glucose-Capillary 91 70 - 99 mg/dL    Comment: Glucose reference range applies only to samples taken after fasting for at least 8 hours.  Glucose, capillary     Status: Abnormal   Collection Time: 09/16/19  5:02  AM  Result Value Ref Range   Glucose-Capillary 116 (H) 70 - 99 mg/dL    Comment: Glucose reference range applies only to samples taken after fasting for at least 8 hours.  Glucose, capillary     Status: Abnormal   Collection Time: 09/16/19  7:51 AM  Result Value Ref Range   Glucose-Capillary 129 (H) 70 - 99 mg/dL    Comment: Glucose reference range applies only to samples taken after fasting for at least 8 hours.    Current Facility-Administered Medications  Medication Dose Route Frequency Provider Last Rate Last Admin  . 0.9 %  sodium chloride infusion   Intra-arterial PRN Chesley Mires, MD   Stopped at 08/30/19 1659  . 0.9 %  sodium chloride infusion  250 mL Intravenous Continuous Chesley Mires, MD      . acetaminophen (TYLENOL) tablet 650 mg  650 mg Oral Q6H PRN Shelly Coss, MD   650 mg at 09/16/19 0513  . bisacodyl (DULCOLAX) suppository 10 mg  10 mg Rectal Daily PRN Chesley Mires, MD      . Chlorhexidine Gluconate Cloth 2 % PADS 6 each  6 each Topical Daily Shelly Coss, MD   6 each at 09/16/19 0902  . docusate (COLACE) 50 MG/5ML liquid 100 mg  100 mg Per Tube BID PRN Chesley Mires, MD   100 mg at 08/28/19 2145  . enoxaparin (LOVENOX) injection 40 mg  40 mg Subcutaneous Q24H Amin, Ankit Chirag, MD   40 mg at 09/15/19 1644  . feeding supplement (NEPRO CARB STEADY) liquid 237 mL  237 mL Oral Q24H Shelly Coss, MD   237 mL at 09/14/19 1233  . lip balm (CARMEX) ointment   Topical PRN Amin, Ankit Chirag, MD      . lisinopril (ZESTRIL) tablet 2.5 mg  2.5 mg Oral Daily Danford, Suann Larry, MD   2.5 mg at 09/15/19 0856  . pantoprazole (PROTONIX) EC tablet 40 mg  40 mg Oral QHS Audria Nine, DO   40 mg at 09/15/19 2224  . polyethylene glycol (MIRALAX / GLYCOLAX) packet 17 g  17 g Oral Daily Audria Nine, DO   17 g at 09/11/19 0941  . potassium chloride SA (KLOR-CON) CR tablet 40 mEq  40 mEq Oral Daily Shelly Coss, MD   40 mEq at 09/15/19 0849  . Resource ThickenUp Clear    Oral PRN Rigoberto Noel, MD      . senna-docusate (Senokot-S) tablet 1 tablet  1 tablet Per Tube Daily Julian Hy, DO   1 tablet at 09/15/19 0850  . sodium chloride 0.9 % bolus 1,000 mL  1,000 mL Intravenous Once Anderson, Chelsey L, DO      . sodium chloride 0.9 % bolus 500 mL  500 mL Intravenous Once Amin, Jeanella Flattery, MD        Musculoskeletal: Strength & Muscle Tone: decreased Gait & Station: Remain in bed during the interview. Patient leans: N/A  Psychiatric Specialty Exam: Physical Exam  Nursing note and vitals reviewed. Constitutional: He appears well-developed and well-nourished.  HENT:  Head: Normocephalic and atraumatic.  Respiratory: Effort normal.  Neurological: He is alert.    Review of Systems  Blood pressure 102/64, pulse (!) 58, temperature 97.6 F (36.4 C), resp. rate 16, height 6' (1.829 m), weight 71 kg, SpO2 100 %.Body mass index is 21.23 kg/m.  General Appearance: Fairly Groomed  Eye Contact:  Good  Speech:  Normal Rate  Volume:  Normal  Mood:  Euthymic  Affect:  Congruent  Thought Process:  Goal Directed and Descriptions of Associations: Circumstantial  Orientation:  Full (Time, Place, and Person)  Thought Content:  Logical  Suicidal Thoughts:  No  Homicidal Thoughts:  No  Memory:  Immediate;   Fair Recent;   Fair Remote;   Fair  Judgement:  Intact  Insight:  Lacking  Psychomotor Activity:  Normal  Concentration:  Concentration: Good and Attention Span: Good  Recall:  [employer] of Knowledge:  Fair  Language:  Good  Akathisia:  Negative  Handed:  Right  AIMS (if indicated):     Assets:  Desire for Improvement Housing Resilience  ADL's:  Impaired  Cognition:  Impaired,  Mild  Sleep:        Treatment Plan Summary: Daily contact with patient to assess and evaluate symptoms and progress in treatment, Medication management and Plan : Patient is seen and examined.  Patient is a 64 year old male with the above-stated past medical and  psychiatric history in which reconsultation was requested secondary to evaluation of capacity.  I agree with the previous consultation earlier this month.  I do agree that this is not a good decision, but despite his cognitive impairment at this point he is able to make rational decisions about his healthcare decisions.  He is currently not on any medications that would directly affect his ability to make decisions.  Review of his laboratories with the most recent being on 09/03/2019 showed normal electrolytes with normal liver function enzymes last drawn on 2/27.  His CK and troponin were both elevated on 2/21 and 2/26.  He does have a mild anemia with a hemoglobin of 12.3 and hematocrit 36.2 on 2/26.  His platelets are a little low at 137,000 and this was checked on 2/26 but is improved from 108,002 weeks ago.  His TSH is normal.  Basically his reversible dementia work-up has been completed except for an RPR.  I will go on and order that for completeness sake.  Drug screen from 2/20 was positive for cocaine.  Blood alcohol was less than 10.  Diagnosis: #  1 mild cognitive impairment most likely secondary to his anoxic brain injury as well as what ever damage was done secondary to his alcohol and cocaine use.  #2 cocaine dependence, #3 alcohol use disorder Disposition: Patient does not meet criteria for psychiatric inpatient admission.  Sharma Covert, MD 09/16/2019 12:44 PM

## 2019-09-16 NOTE — Progress Notes (Signed)
Pt requested that fall alarms be turned off.

## 2019-09-17 LAB — RPR: RPR Ser Ql: NONREACTIVE

## 2019-09-17 LAB — GLUCOSE, CAPILLARY: Glucose-Capillary: 102 mg/dL — ABNORMAL HIGH (ref 70–99)

## 2019-09-17 MED ORDER — LISINOPRIL 2.5 MG PO TABS: 2.5000 mg | ORAL_TABLET | Freq: Every day | ORAL | 1 refills | Status: AC

## 2019-09-17 NOTE — Discharge Summary (Signed)
Physician Discharge Summary  Debbra RidingMichael D Gerstel ZOX:[id number] DOB: 08/23/1955 DOA: 08/23/2019  PCP: [facility]  Admit date: 08/23/2019 Discharge date: 09/17/2019  Admitted From: home Disposition:  Home with outpatient PT  Recommendations for Outpatient Follow-up:  1. Follow up with PCP in 1-2 weeks 2. Follow-up with cardiology in 2 to 3 weeks  Home Health: PT Equipment/Devices: None  Discharge Condition: Stable CODE STATUS: Full code Diet recommendation: Low-sodium, heart healthy  HPI: Per admitting MD, Mr. Osborn CohoManigault is a 64 year old male with past medical history of cocaine and alcohol abuse, depression and orthopedic problems who was found unresponsive by his roommate/fiance around 3 PM 08/23/2019.  Per reports, he was last seen well around 10 or 11 AM and was drinking alcohol and smoking cocaine.  He stopped breathing after fiance found him and she called EMS.  Narcan was administered without improvement, pulses were then lost and CPR was performed and he responded to 1 round without epi.  He was intubated and transported to the emergency department. In the ED, he has not had any significant neurologic activity hours after after RSI medications.  Did not show signs of STEMI or significant ischemia,  labs were significant for lactic acid >11, respiratory acidosis, renal insufficiency, mild troponin anemia and negative flu and COVID-19.  He has required Levophed for hypotension and Bair hugger for hypothermia.  CT head, chest, abdomen pelvis pending and PCCM consulted for admission.  Hospital Course / Discharge diagnoses: Cardiac arrest likely secondary to drug use / New systolic congestive heart failure with reduced ejection fraction, 30% with global hypokinesia -patient was initially intubated and admitted to the ICU, was on a vent for about 6 days.  He was extubated, gradually improved, he was able to be weaned off to room air.  Cardiology also consulted due to  new onset CHF, underwent a stress test on 2/20 which showed to be a low risk study and cleared for discharge at this time.  He was placed on ACE inhibitor with lisinopril, and will likely need outpatient follow-up with cardiology, repeat echo, medication adjustment.  He remains euvolemic while hospitalized and with intermittent soft blood pressures, asymptomatic Acute hypoxic and hypercapnic respiratory failure complicated by aspiration pneumonitis -Patient developed fever after admission.  MSSA in sputum.  Completed 8 days antibiotics.  Resolved. 1.4 cm pulmonary nodule - This was an incidental finding, repeat outpatient CT in 3 months Acute metabolic encephalopathy -The patient had some initial severe embolic encephalopathy, from anoxic brain injury, this is now improved.  Unclear if patient has new cognitive deficits or is back to baseline. The patient's interactions demonstrate logical, ordered thinking, and after discussion of potential benefits and harms of treatment, patient does have medical decision-making capacity, was also evaluated by psychiatry who concurs Left perinephric hematoma - Stable Disposition - The patient was admitted after cardiac arrest. Prior to admission he was independent with all ADLs, lives completely independently. Although he is approaching some baseline after rehabilitating here for almost 3 weeks, he is still unstable, weak, requires supervision for shower transfers, and has significantly modified independence. Per Dr Maryfrances Bunnellanford, we have been unable to arrange SNF or [facility], and his only option for rehabilitation will be to go home by himself, and go to outpatient physical therapy.  This has been arranged, along with transportation. Confirmed with Sw on the day of discharge  Discharge Instructions  Discharge Instructions    Diet - low sodium heart healthy   Complete by: As directed  Increase activity slowly   Complete by: As directed      Allergies as of 09/17/2019       Reactions   Hydrocodone Itching, Nausea And Vomiting   Percocet [oxycodone-acetaminophen] Nausea And Vomiting   Can take oxycodone without tylenol additive   Tramadol Nausea Only      Medication List    TAKE these medications   acetaminophen 500 MG tablet Commonly known as: TYLENOL Take 500-1,000 mg by mouth every 6 (six) hours as needed for headache (pain).   [employer] D3 50 MCG (2000 UT) Caps Generic drug: Cholecalciferol Take 1 capsule by mouth daily.   lisinopril 2.5 MG tablet Commonly known as: ZESTRIL Take 1 tablet (2.5 mg total) by mouth daily. Start taking on: September 18, 2019   pregabalin 75 MG capsule Commonly known as: LYRICA Take 75-150 mg by mouth See admin instructions. 07/14/2019: take 1 capsule (75 mg) by mouth twice daily, may increase to 2 capsules (150 mg) twice daily after 1 week.      Follow-up Information    Marinus Maw, MD. Call in 1 month(s).   Specialty: Cardiology Contact information: [address] [place] [address] [phone]        [facility] Follow up.   Specialty: Rehabilitation Contact information: [address] [id number] [place] [address] [phone]          Consultations:  Cardiology   PCCM  Psychiatry   Procedures/Studies  EEG  Result Date: 08/24/2019 Thana Farr, MD     08/24/2019  5:59 PM ELECTROENCEPHALOGRAM REPORT Patient: LENFORD BEDDOW       Room #: [facility] EEG No. ID: [id number] Age: 64 y.o.        Sex: male Requesting Physician: Craige Cotta Report Date:  08/24/2019       Interpreting Physician: Thana Farr History: WETZEL MEESTER is an 64 y.o. male s/p arrest Medications: Precedex, Fentanyl, Vancomycin, Cefepime Conditions of Recording:  This is a 21 channel routine scalp EEG performed with bipolar and monopolar montages arranged in accordance to the international 10/20 system of electrode placement. One channel was  dedicated to EKG recording. The patient is in the intubated and sedated state. Description:  The background activity is dominated by low voltage fast beta activity that is diffusely distributed.  This appears to be superimposed on a slow background that consists mostly of poorly organized theta activity.  This background activity is continuous.  No epileptiform activity is noted.  Hyperventilation and intermittent photic stimulation were not performed. IMPRESSION: This is an abnormal electroencephalogram secondary to fast beta activity superimposed on a slow background.  This finding is consistent with the patient's medications.  No epileptiform activity is noted.  Thana Farr, MD Neurology [phone] 08/24/2019, 5:54 PM   CT ABDOMEN PELVIS WO CONTRAST  Addendum Date: 08/24/2019   ADDENDUM REPORT: 08/23/2019 19:40 ADDENDUM: There is diffuse gallbladder wall thickening which may be secondary to the patient's volume status or resuscitation. Consider further evaluation with a dedicated right upper quadrant ultrasound as clinically indicated. Electronically Signed   By: Katherine Mantle M.D.   On: 08/23/2019 19:40   Result Date: 08/23/2019 CLINICAL DATA:  Encephalopathy. Unresponsive. EXAM: CT CHEST, ABDOMEN AND PELVIS WITHOUT CONTRAST TECHNIQUE: Multidetector CT imaging of the chest, abdomen and pelvis was performed following the standard protocol without IV contrast. COMPARISON:  CT dated August 22, 2014. FINDINGS: CT CHEST FINDINGS Cardiovascular: The heart size is relatively normal. There is no evidence for  a thoracic aortic aneurysm. There are no significant atherosclerotic changes of the thoracic aorta. There is no significant pericardial effusion. Mediastinum/Nodes: --No mediastinal or hilar lymphadenopathy. --No axillary lymphadenopathy. --No supraclavicular lymphadenopathy. --Normal thyroid gland. --there is an enteric tube that terminates in the gastric body/antrum. Lungs/Pleura: The  endotracheal tube terminates above the carina. There are minimal emphysematous changes bilaterally. There is a subpleural nodule involving the anterior right middle lobe measuring approximately 1.4 cm (axial series 4, image 96. There is an additional 1.8 cm airspace opacity in the anteromedial right middle lobe (axial series 4, image 96). There is bibasilar airspace disease favored to represent atelectasis. There is a large amount of debris within the trachea and left mainstem bronchus. There are filling defects within the right mainstem bronchus along the non dependent portion of the airway. These are favored to represent adherent secretions. There is no pneumothorax. Musculoskeletal: There are old healed right-sided rib fractures. There is an acute fracture involving the posterior tenth rib on the right. CT ABDOMEN PELVIS FINDINGS Hepatobiliary: The liver is normal. There is diffuse gallbladder wall thickening.There is no biliary ductal dilation. Pancreas: There may be some mild peripancreatic fat stranding, however evaluation is limited by streak artifact from the patient's arms Spleen: No splenic laceration or hematoma. Adrenals/Urinary Tract: --Adrenal glands: No adrenal hemorrhage. --Right kidney/ureter: No hydronephrosis or perinephric hematoma. --Left kidney/ureter: There is an ill-defined hyperattenuating area in the inferior left perinephric space (axial series 3, image 76). This density measures approximately 3.6 cm in length. --Urinary bladder: There is diffuse urinary bladder wall thickening. A Foley catheter is in place and appears grossly well position. Stomach/Bowel: --Stomach/Duodenum: No hiatal hernia or other gastric abnormality. Normal duodenal course and caliber. --Small bowel: No dilatation or inflammation. --Colon: No focal abnormality. --Appendix: Normal. Vascular/Lymphatic: Atherosclerotic calcification is present within the non-aneurysmal abdominal aorta, without hemodynamically significant  stenosis. --No retroperitoneal lymphadenopathy. --No mesenteric lymphadenopathy. --No pelvic or inguinal lymphadenopathy. Reproductive: Unremarkable Other: No ascites or free air. The abdominal wall is normal. Musculoskeletal. No acute displaced fractures. IMPRESSION: 1. There is an ill-defined 3.6 cm hyperattenuating area in the left perinephric space. Findings are suspicious for a small retroperitoneal hematoma. This finding is suboptimally evaluated in the absence of IV contrast. 2. Diffuse urinary bladder wall thickening, which may be secondary to cystitis. Recommend correlation with urinalysis. 3. There is a moderate amount of aspirated debris within the trachea and left mainstem bronchus as detailed above. 4. There is an acute displaced fracture involving the posterior tenth rib on the right. There is no evidence for a right-sided pneumothorax. 5. Subpleural nodule involving the anterior right middle lobe measuring 1.4 cm. A follow-up CT is recommended in 3 months. 6. Possible mild peripancreatic fat stranding, however evaluation is limited by streak artifact from the patient's arms. Recommend correlation with lipase. 7. Bibasilar atelectasis. 8. Well-positioned endotracheal tube and enteric tube. 9.  Aortic Atherosclerosis (ICD10-I70.0). Electronically Signed: By: Katherine Mantle M.D. On: 08/23/2019 19:03   DG Wrist Complete Right  Result Date: 08/30/2019 CLINICAL DATA:  64 year old male with wrist pain EXAM: RIGHT WRIST - COMPLETE 3+ VIEW COMPARISON:  None. FINDINGS: Osteopenia. No acute displaced fracture. Mild degenerative changes of the carpal bones with the alignment maintained. No focal soft tissue swelling. No radiopaque foreign body. Unremarkable appearance of the scaphoid on the scaphoid view IMPRESSION: Negative for acute bony abnormality Electronically Signed   By: Gilmer Mor D.O.   On: 08/30/2019 13:49   CT Head Wo Contrast  Result Date: 08/23/2019  CLINICAL DATA:  64 year old male  with altered mental status. Patient was found unresponsive. EXAM: CT HEAD WITHOUT CONTRAST TECHNIQUE: Contiguous axial images were obtained from the base of the skull through the vertex without intravenous contrast. COMPARISON:  Head CT dated 06/14/2015. FINDINGS: Brain: The ventricles and sulci appropriate size for patient's age. The gray-white matter discrimination is preserved. There is no acute intracranial hemorrhage. No mass effect or midline shift. No extra-axial fluid collection. Vascular: No hyperdense vessel or unexpected calcification. Skull: Normal. Negative for fracture or focal lesion. Sinuses/Orbits: There is mild mucoperiosteal thickening of paranasal sinuses. Several small bilateral maxillary sinus retention cyst or polyp noted. Old bilateral lamina Propecia fractures as well as fixation of the anterior wall of the right maxillary sinus. No air-fluid level. The mastoid air cells are clear. Other: None IMPRESSION: 1. No acute intracranial pathology. 2. Old facial bone fractures and mild mucoperiosteal thickening of paranasal sinuses. Electronically Signed   By: Elgie Collard M.D.   On: 08/23/2019 18:58   CT CHEST WO CONTRAST  Addendum Date: 08/24/2019   ADDENDUM REPORT: 08/23/2019 19:40 ADDENDUM: There is diffuse gallbladder wall thickening which may be secondary to the patient's volume status or resuscitation. Consider further evaluation with a dedicated right upper quadrant ultrasound as clinically indicated. Electronically Signed   By: Katherine Mantle M.D.   On: 08/23/2019 19:40   Result Date: 08/23/2019 CLINICAL DATA:  Encephalopathy. Unresponsive. EXAM: CT CHEST, ABDOMEN AND PELVIS WITHOUT CONTRAST TECHNIQUE: Multidetector CT imaging of the chest, abdomen and pelvis was performed following the standard protocol without IV contrast. COMPARISON:  CT dated August 22, 2014. FINDINGS: CT CHEST FINDINGS Cardiovascular: The heart size is relatively normal. There is no evidence for a  thoracic aortic aneurysm. There are no significant atherosclerotic changes of the thoracic aorta. There is no significant pericardial effusion. Mediastinum/Nodes: --No mediastinal or hilar lymphadenopathy. --No axillary lymphadenopathy. --No supraclavicular lymphadenopathy. --Normal thyroid gland. --there is an enteric tube that terminates in the gastric body/antrum. Lungs/Pleura: The endotracheal tube terminates above the carina. There are minimal emphysematous changes bilaterally. There is a subpleural nodule involving the anterior right middle lobe measuring approximately 1.4 cm (axial series 4, image 96. There is an additional 1.8 cm airspace opacity in the anteromedial right middle lobe (axial series 4, image 96). There is bibasilar airspace disease favored to represent atelectasis. There is a large amount of debris within the trachea and left mainstem bronchus. There are filling defects within the right mainstem bronchus along the non dependent portion of the airway. These are favored to represent adherent secretions. There is no pneumothorax. Musculoskeletal: There are old healed right-sided rib fractures. There is an acute fracture involving the posterior tenth rib on the right. CT ABDOMEN PELVIS FINDINGS Hepatobiliary: The liver is normal. There is diffuse gallbladder wall thickening.There is no biliary ductal dilation. Pancreas: There may be some mild peripancreatic fat stranding, however evaluation is limited by streak artifact from the patient's arms Spleen: No splenic laceration or hematoma. Adrenals/Urinary Tract: --Adrenal glands: No adrenal hemorrhage. --Right kidney/ureter: No hydronephrosis or perinephric hematoma. --Left kidney/ureter: There is an ill-defined hyperattenuating area in the inferior left perinephric space (axial series 3, image 76). This density measures approximately 3.6 cm in length. --Urinary bladder: There is diffuse urinary bladder wall thickening. A Foley catheter is in place  and appears grossly well position. Stomach/Bowel: --Stomach/Duodenum: No hiatal hernia or other gastric abnormality. Normal duodenal course and caliber. --Small bowel: No dilatation or inflammation. --Colon: No focal abnormality. --Appendix: Normal. Vascular/Lymphatic: Atherosclerotic  calcification is present within the non-aneurysmal abdominal aorta, without hemodynamically significant stenosis. --No retroperitoneal lymphadenopathy. --No mesenteric lymphadenopathy. --No pelvic or inguinal lymphadenopathy. Reproductive: Unremarkable Other: No ascites or free air. The abdominal wall is normal. Musculoskeletal. No acute displaced fractures. IMPRESSION: 1. There is an ill-defined 3.6 cm hyperattenuating area in the left perinephric space. Findings are suspicious for a small retroperitoneal hematoma. This finding is suboptimally evaluated in the absence of IV contrast. 2. Diffuse urinary bladder wall thickening, which may be secondary to cystitis. Recommend correlation with urinalysis. 3. There is a moderate amount of aspirated debris within the trachea and left mainstem bronchus as detailed above. 4. There is an acute displaced fracture involving the posterior tenth rib on the right. There is no evidence for a right-sided pneumothorax. 5. Subpleural nodule involving the anterior right middle lobe measuring 1.4 cm. A follow-up CT is recommended in 3 months. 6. Possible mild peripancreatic fat stranding, however evaluation is limited by streak artifact from the patient's arms. Recommend correlation with lipase. 7. Bibasilar atelectasis. 8. Well-positioned endotracheal tube and enteric tube. 9.  Aortic Atherosclerosis (ICD10-I70.0). Electronically Signed: By: Katherine Mantle M.D. On: 08/23/2019 19:03   US RENAL  Result Date: 08/28/2019 CLINICAL DATA:  Perinephric hematoma EXAM: RENAL / URINARY TRACT ULTRASOUND COMPLETE COMPARISON:  08/23/2019 FINDINGS: Right Kidney: Renal measurements: 12.4 x 4.6 x 5.1 cm = volume:  152 mL . Echogenicity within normal limits. No mass or hydronephrosis visualized. Left Kidney: Renal measurements: 12.1 x 4.5 by 3.7 cm = volume: 106 mL. Echotexture is within normal limits. No hydronephrosis. There is a 2.0 x 0.9 by 1.2 cm fluid collection in the left perinephric space consistent with the perinephric hematoma seen previously. Overall, allowing for differences in modality, no appreciable change. Bladder: Likely diffuse bladder wall thickening sequela of chronic bladder outlet and trabeculation obstruction. Other: None IMPRESSION: 1. Stable small left perinephric hematoma. 2. Bladder wall thickening with trabeculations, consistent with sequela of chronic bladder outlet obstruction. Electronically Signed   By: Sharlet Salina M.D.   On: 08/28/2019 21:19   NM Myocar Multi W/Spect W/Wall Motion / EF  Result Date: 08/31/2019  EKG without changes  Normal perfusion very mild soft tissue attenuation (diaphragm, bowel) No significant ischemia or scar  LVEF 59%  Low risk study    DG CHEST PORT 1 VIEW  Result Date: 08/26/2019 CLINICAL DATA:  Hypoxemic respiratory failure EXAM: PORTABLE CHEST 1 VIEW COMPARISON:  08/23/2019 FINDINGS: Endotracheal tube now at 2 cm above the carina previously 3.4 cm. Gastric tube courses through in off the field of the radiograph. Cardiomediastinal contours and hilar structures are normal. Leads project over the chest. Lungs are clear. No signs of pleural effusion. Visualized skeletal structures are unremarkable. IMPRESSION: Endotracheal tube advanced since the prior study now 2 cm above the carina. No acute cardiopulmonary disease. These results will be called to the ordering clinician or representative by the Radiologist Assistant, and communication documented in the PACS or zVision Dashboard. Electronically Signed   By: Donzetta Kohut M.D.   On: 08/26/2019 09:02   DG CHEST PORT 1 VIEW  Result Date: 08/23/2019 CLINICAL DATA:  Endotracheal tube. Reconfirm  endotracheal tube position. EXAM: PORTABLE CHEST 1 VIEW COMPARISON:  Radiograph and CT earlier this day. FINDINGS: Endotracheal tube tip 3.4 cm from the carina. Enteric tube tip below the diaphragm not included in the field of view. Unchanged heart size and mediastinal contours. Right middle lobe nodule on CT not well seen on radiograph. Retrocardiac atelectasis. No pulmonary  edema, pneumothorax or large pleural effusion. Remote right rib fractures. IMPRESSION: 1. Endotracheal tube tip 3.4 cm from the carina. Enteric tube tip below the diaphragm not included in the field of view. 2. Retrocardiac atelectasis. 3. Right middle lobe pulmonary nodules on CT are not well seen radiographically. Electronically Signed   By: Narda Rutherford M.D.   On: 08/23/2019 23:12   DG Chest Port 1 View  Result Date: 08/23/2019 CLINICAL DATA:  Altered mental status EXAM: PORTABLE CHEST 1 VIEW COMPARISON:  04/14/2015 FINDINGS: Endotracheal tube tip is 4 cm above the inferior margin of the carina. Lungs are clear. Normal cardiomediastinal contours. Orogastric tube tip below the field of view. The IMPRESSION: Endotracheal tube tip 4 cm above the carina. Electronically Signed   By: Deatra Robinson M.D.   On: 08/23/2019 18:00   DG Abd Portable 1V  Result Date: 08/28/2019 CLINICAL DATA:  OG tube placement EXAM: PORTABLE ABDOMEN - 1 VIEW COMPARISON:  None. FINDINGS: Mild airspace disease at the bases. Esophageal tube tip overlies the gastroduodenal junction. Visible upper gas pattern is unremarkable. IMPRESSION: Esophageal tube tip overlies the gastroduodenal junction Electronically Signed   By: Jasmine Pang M.D.   On: 08/28/2019 23:25   ECHOCARDIOGRAM COMPLETE  Result Date: 08/24/2019    ECHOCARDIOGRAM REPORT   Patient Name:   LANDO ALCALDE Date of Exam: 08/24/2019 Medical Rec #:  [id number]           Height:       72.0 in Accession #:    [id number]          Weight:       179.0 lb Date of Birth:  07-Oct-1955            BSA:           2.033 m Patient Age:    63 years            BP:           91/70 mmHg Patient Gender: M                   HR:           70 bpm. Exam Location:  Inpatient Procedure: 2D Echo, Cardiac Doppler and Color Doppler Indications:    Acute respiratory insufficiency 518.82/R06.89  History:        Patient has no prior history of Echocardiogram examinations.                 Drug abuse.  Sonographer:    Roosvelt Maser RDCS Referring Phys: [id number] VINEET SOOD  Sonographer Comments: Echo performed with patient supine and on artificial respirator and suboptimal apical window. IMPRESSIONS  1. Left ventricular ejection fraction, by estimation, is 30%. The left ventricle has moderate to severely decreased function. The left ventricle demonstrates global hypokinesis. Left ventricular diastolic parameters are indeterminate.  2. Right ventricular systolic function is severely reduced. The right ventricular size is moderately enlarged. Unable to assess PASP.  3. Right atrial size was mildly dilated.  4. The mitral valve is grossly normal. Mild mitral valve regurgitation.  5. Tricuspid valve regurgitation is mild to moderate.  6. The aortic valve is tricuspid. Aortic valve regurgitation is not visualized. FINDINGS  Left Ventricle: Left ventricular ejection fraction, by estimation, is 30%. The left ventricle has moderate to severely decreased function. The left ventricle demonstrates global hypokinesis. The left ventricular internal cavity size was normal in size. There is borderline left ventricular hypertrophy. Left ventricular diastolic parameters  are indeterminate. Right Ventricle: The right ventricular size is moderately enlarged. No increase in right ventricular wall thickness. Right ventricular systolic function is severely reduced. Left Atrium: Left atrial size was normal in size. Right Atrium: Right atrial size was mildly dilated. Pericardium: There is no evidence of pericardial effusion. Mitral Valve: The mitral valve is grossly normal.  Mild mitral valve regurgitation. Tricuspid Valve: The tricuspid valve is grossly normal. Tricuspid valve regurgitation is mild to moderate. Aortic Valve: The aortic valve is tricuspid. Aortic valve regurgitation is not visualized. Mild aortic valve annular calcification. Pulmonic Valve: The pulmonic valve was grossly normal. Pulmonic valve regurgitation is trivial. Aorta: The aortic root is normal in size and structure. IAS/Shunts: No atrial level shunt detected by color flow Doppler.  LEFT VENTRICLE PLAX 2D LVIDd:         4.80 cm     Diastology LVIDs:         4.00 cm     LV e' lateral:   10.90 cm/s LV PW:         1.10 cm     LV E/e' lateral: 4.4 LV IVS:        1.00 cm     LV e' medial:    5.78 cm/s LVOT diam:     2.10 cm     LV E/e' medial:  8.2 LV SV:         33.98 ml LV SV Index:   16.72 LVOT Area:     3.46 cm  LV Volumes (MOD) LV vol d, MOD A4C: 87.7 ml LV vol s, MOD A4C: 61.6 ml LV SV MOD A4C:     87.7 ml RIGHT VENTRICLE            IVC RV Basal diam:  4.70 cm    IVC diam: 1.80 cm RV Mid diam:    3.60 cm RV S prime:     4.95 cm/s TAPSE (M-mode): 0.9 cm LEFT ATRIUM           Index       RIGHT ATRIUM           Index LA diam:      3.50 cm 1.72 cm/m  RA Area:     21.70 cm LA Vol (A4C): 47.6 ml 23.42 ml/m RA Volume:   68.90 ml  33.90 ml/m  AORTIC VALVE LVOT Vmax:   62.10 cm/s LVOT Vmean:  41.800 cm/s LVOT VTI:    0.098 m  AORTA Ao Root diam: 3.80 cm MITRAL VALVE               TRICUSPID VALVE MV Area (PHT): 2.87 cm    TR Peak grad:   14.0 mmHg MV Decel Time: 264 msec    TR Vmax:        187.00 cm/s MV E velocity: 47.60 cm/s MV A velocity: 55.00 cm/s  SHUNTS MV E/A ratio:  0.87        Systemic VTI:  0.10 m                            Systemic Diam: 2.10 cm Nona Dell MD Electronically signed by Nona Dell MD Signature Date/Time: 08/24/2019/2:01:18 PM    Final       Subjective: - no chest pain, shortness of breath, no abdominal pain, nausea or vomiting.   Discharge Exam: BP 93/62   Pulse (!) 52    Temp 98 F (36.7 C) (Oral)  Resp 17   Ht 6' (1.829 m)   Wt 71 kg   SpO2 99%   BMI 21.23 kg/m   General: Pt is alert, awake, not in acute distress Cardiovascular: RRR, S1/S2 +, no rubs, no gallops Respiratory: CTA bilaterally, no wheezing, no rhonchi Abdominal: Soft, NT, ND, bowel sounds + Extremities: no edema, no cyanosis   The results of significant diagnostics from this hospitalization (including imaging, microbiology, ancillary and laboratory) are listed below for reference.     Microbiology: No results found for this or any previous visit (from the past 240 hour(s)).   Labs: Basic Metabolic Panel: No results for input(s): NA, K, CL, CO2, GLUCOSE, BUN, CREATININE, CALCIUM, MG, PHOS in the last 168 hours. Liver Function Tests: No results for input(s): AST, ALT, ALKPHOS, BILITOT, PROT, ALBUMIN in the last 168 hours. CBC: No results for input(s): WBC, NEUTROABS, HGB, HCT, MCV, PLT in the last 168 hours. CBG: Recent Labs  Lab 09/14/19 0753 09/15/19 0809 09/16/19 0502 09/16/19 0751 09/17/19 0802  GLUCAP 98 91 116* 129* 102*   Hgb A1c No results for input(s): HGBA1C in the last 72 hours. Lipid Profile No results for input(s): CHOL, HDL, LDLCALC, TRIG, CHOLHDL, LDLDIRECT in the last 72 hours. Thyroid function studies No results for input(s): TSH, T4TOTAL, T3FREE, THYROIDAB in the last 72 hours.  Invalid input(s): FREET3 Urinalysis    Component Value Date/Time   COLORURINE YELLOW 08/23/2019 1726   APPEARANCEUR CLEAR 08/23/2019 1726   LABSPEC 1.006 08/23/2019 1726   PHURINE 6.0 08/23/2019 1726   GLUCOSEU NEGATIVE 08/23/2019 1726   HGBUR NEGATIVE 08/23/2019 1726   BILIRUBINUR NEGATIVE 08/23/2019 1726   KETONESUR NEGATIVE 08/23/2019 1726   PROTEINUR NEGATIVE 08/23/2019 1726   UROBILINOGEN 0.2 11/05/2014 0439   NITRITE NEGATIVE 08/23/2019 1726   LEUKOCYTESUR NEGATIVE 08/23/2019 1726    FURTHER DISCHARGE INSTRUCTIONS:   Get Medicines reviewed and  adjusted: Please take all your medications with you for your next visit with your Primary MD   Laboratory/radiological data: Please request your Primary MD to go over all hospital tests and procedure/radiological results at the follow up, please ask your Primary MD to get all Hospital records sent to his/her office.   In some cases, they will be blood work, cultures and biopsy results pending at the time of your discharge. Please request that your primary care M.D. goes through all the records of your hospital data and follows up on these results.   Also Note the following: If you experience worsening of your admission symptoms, develop shortness of breath, life threatening emergency, suicidal or homicidal thoughts you must seek medical attention immediately by calling 911 or calling your MD immediately  if symptoms less severe.   You must read complete instructions/literature along with all the possible adverse reactions/side effects for all the Medicines you take and that have been prescribed to you. Take any new Medicines after you have completely understood and accpet all the possible adverse reactions/side effects.    Do not drive when taking Pain medications or sleeping medications (Benzodaizepines)   Do not take more than prescribed Pain, Sleep and Anxiety Medications. It is not advisable to combine anxiety,sleep and pain medications without talking with your primary care practitioner   Special Instructions: If you have smoked or chewed Tobacco  in the last 2 yrs please stop smoking, stop any regular Alcohol  and or any Recreational drug use.   Wear Seat belts while driving.   Please note: You were cared for by a  hospitalist during your hospital stay. Once you are discharged, your primary care physician will handle any further medical issues. Please note that NO REFILLS for any discharge medications will be authorized once you are discharged, as it is imperative that you return to your  primary care physician (or establish a relationship with a primary care physician if you do not have one) for your post hospital discharge needs so that they can reassess your need for medications and monitor your lab values.  Time coordinating discharge: 40 minutes  SIGNED:  Marzetta Board, MD, PhD 09/17/2019, 2:06 PM

## 2019-09-17 NOTE — TOC Transition Note (Signed)
Transition of Care [facility]) - CM/SW Discharge Note   Patient Details  Name: Gary Davis MRN: [id number] Date of Birth: 1955-12-12  [facility] CM/SW Contact:  Truddie Hidden, LCSW Phone Number: 09/17/2019, 2:42 PM   Clinical Narrative:     Discharged home with outpatient services. CSW spoke with rep at [facility] outpatient services, and she stated that the CSW will have to send the referral into [phone] and the physical therapist will review and call the patient. Transportation services will be providing transport to and from outpatient therapy services. Patient states that he feels ready to dc home. No other needs at this time. Case closed to this CSW.   Final next level of care: OP Rehab Barriers to Discharge: Barriers Resolved   Patient Goals and CMS Choice Patient states their goals for this hospitalization and ongoing recovery are:: to go home CMS Medicare.gov Compare Post Acute Care list provided to:: Patient Choice offered to / list presented to : Patient  Discharge Placement                       Discharge Plan and Services                          HH Arranged: PT, OT, Nurse's Aide HH Agency: [facility] Date [facility] Agency Contacted: 09/14/19 Time HH Agency Contacted: 1101 Representative spoke with at [facility] Agency: Barbara Cower  Social Determinants of Health (SDOH) Interventions     Readmission Risk Interventions Readmission Risk Prevention Plan 09/01/2019  Transportation Screening Complete  PCP or Specialist Appt within 5-7 Days Complete  Home Care Screening Complete  Medication Review (RN CM) Complete  Some recent data might be hidden

## 2019-09-17 NOTE — Plan of Care (Signed)
  Problem: Education: Goal: Knowledge of General Education information will improve Description: Including pain rating scale, medication(s)/side effects and non-pharmacologic comfort measures Outcome: Progressing   Problem: Health Behavior/Discharge Planning: Goal: Ability to manage health-related needs will improve Outcome: Progressing   Problem: Clinical Measurements: Goal: Ability to maintain clinical measurements within normal limits will improve Outcome: Progressing   Problem: Activity: Goal: Risk for activity intolerance will decrease Outcome: Progressing   Problem: Nutrition: Goal: Adequate nutrition will be maintained Outcome: Progressing   Problem: Coping: Goal: Level of anxiety will decrease Outcome: Progressing   Problem: Elimination: Goal: Will not experience complications related to bowel motility Outcome: Progressing Goal: Will not experience complications related to urinary retention Outcome: Progressing   

## 2019-09-17 NOTE — Discharge Instructions (Signed)
Follow with Medicine, [facility] in 5-7 days  Please get a complete blood count and chemistry panel checked by your Primary MD at your next visit, and again as instructed by your Primary MD. Please get your medications reviewed and adjusted by your Primary MD.  Please request your Primary MD to go over all Hospital Tests and Procedure/Radiological results at the follow up, please get all Hospital records sent to your Prim MD by signing hospital release before you go home.  In some cases, there will be blood work, cultures and biopsy results pending at the time of your discharge. Please request that your primary care M.D. goes through all the records of your hospital data and follows up on these results.  If you had Pneumonia of Lung problems at the Hospital: Please get a 2 view Chest X ray done in 6-8 weeks after hospital discharge or sooner if instructed by your Primary MD.  If you have Congestive Heart Failure: Please call your Cardiologist or Primary MD anytime you have any of the following symptoms:  1) 3 pound weight gain in 24 hours or 5 pounds in 1 week  2) shortness of breath, with or without a dry hacking cough  3) swelling in the hands, feet or stomach  4) if you have to sleep on extra pillows at night in order to breathe  Follow cardiac low salt diet and 1.5 lit/day fluid restriction.  If you have diabetes Accuchecks 4 times/day, Once in AM empty stomach and then before each meal. Log in all results and show them to your primary doctor at your next visit. If any glucose reading is under 80 or above 300 call your primary MD immediately.  If you have Seizure/Convulsions/Epilepsy: Please do not drive, operate heavy machinery, participate in activities at heights or participate in high speed sports until you have seen by Primary MD or a Neurologist and advised to do so again. Per [facility] statutes, patients with seizures are not allowed to drive until they  have been seizure-free for six months.  Use caution when using heavy equipment or power tools. Avoid working on ladders or at heights. Take showers instead of baths. Ensure the water temperature is not too high on the home water heater. Do not go swimming alone. Do not lock yourself in a room alone (i.e. bathroom). When caring for infants or small children, sit down when holding, feeding, or changing them to minimize risk of injury to the child in the event you have a seizure. Maintain good sleep hygiene. Avoid alcohol.   If you had Gastrointestinal Bleeding: Please ask your Primary MD to check a complete blood count within one week of discharge or at your next visit. Your endoscopic/colonoscopic biopsies that are pending at the time of discharge, will also need to followed by your Primary MD.  Get Medicines reviewed and adjusted. Please take all your medications with you for your next visit with your Primary MD  Please request your Primary MD to go over all hospital tests and procedure/radiological results at the follow up, please ask your Primary MD to get all Hospital records sent to his/her office.  If you experience worsening of your admission symptoms, develop shortness of breath, life threatening emergency, suicidal or homicidal thoughts you must seek medical attention immediately by calling 911 or calling your MD immediately  if symptoms less severe.  You must read complete instructions/literature along with all the possible adverse reactions/side effects for all the Medicines  you take and that have been prescribed to you. Take any new Medicines after you have completely understood and accpet all the possible adverse reactions/side effects.   Do not drive or operate heavy machinery when taking Pain medications.   Do not take more than prescribed Pain, Sleep and Anxiety Medications  Special Instructions: If you have smoked or chewed Tobacco  in the last 2 yrs please stop smoking, stop any  regular Alcohol  and or any Recreational drug use.  Wear Seat belts while driving.  Please note You were cared for by a hospitalist during your hospital stay. If you have any questions about your discharge medications or the care you received while you were in the hospital after you are discharged, you can call the unit and asked to speak with the hospitalist on call if the hospitalist that took care of you is not available. Once you are discharged, your primary care physician will handle any further medical issues. Please note that NO REFILLS for any discharge medications will be authorized once you are discharged, as it is imperative that you return to your primary care physician (or establish a relationship with a primary care physician if you do not have one) for your aftercare needs so that they can reassess your need for medications and monitor your lab values.  You can reach the hospitalist office at phone [phone] or fax [phone]   If you do not have a primary care physician, you can call [phone] for a physician referral.  Activity: As tolerated with Full fall precautions use walker/cane & assistance as needed    Diet: low sodium, heart healthy  Disposition Home

## 2019-09-22 ENCOUNTER — Inpatient Hospital Stay: Payer: Medicare Other | Admitting: Pulmonary Disease

## 2019-10-22 ENCOUNTER — Telehealth: Payer: Self-pay | Admitting: *Deleted

## 2019-10-22 NOTE — Telephone Encounter (Signed)
Unable to leave message with [facility] [phone]. Mailed letter informing of the [employer] number.

## 2019-10-22 NOTE — Telephone Encounter (Signed)
Mr. Sweitzer request doctor to cut his toenails in-home.

## 2019-10-30 NOTE — Telephone Encounter (Signed)
Pt called states he would like someone to call him to schedule an appt to get his toenails cut, he is disabled and unable to take care of them.

## 2020-01-01 DEATH — deceased
